# Patient Record
Sex: Female | Born: 1935 | Race: Black or African American | Hispanic: No | Smoking: Never smoker
Health system: Southern US, Community
[De-identification: ages and names within clinical notes are randomized; demographics above are authoritative.]

## PROBLEM LIST (undated history)

## (undated) DIAGNOSIS — G4733 Obstructive sleep apnea (adult) (pediatric): Secondary | ICD-10-CM

## (undated) DIAGNOSIS — I10 Essential (primary) hypertension: Secondary | ICD-10-CM

## (undated) DIAGNOSIS — N289 Disorder of kidney and ureter, unspecified: Secondary | ICD-10-CM

## (undated) DIAGNOSIS — N182 Chronic kidney disease, stage 2 (mild): Secondary | ICD-10-CM

## (undated) DIAGNOSIS — I739 Peripheral vascular disease, unspecified: Secondary | ICD-10-CM

## (undated) DIAGNOSIS — E559 Vitamin D deficiency, unspecified: Secondary | ICD-10-CM

## (undated) DIAGNOSIS — L409 Psoriasis, unspecified: Secondary | ICD-10-CM

## (undated) DIAGNOSIS — K219 Gastro-esophageal reflux disease without esophagitis: Secondary | ICD-10-CM

## (undated) DIAGNOSIS — M1990 Unspecified osteoarthritis, unspecified site: Secondary | ICD-10-CM

## (undated) DIAGNOSIS — G8929 Other chronic pain: Secondary | ICD-10-CM

## (undated) DIAGNOSIS — M25569 Pain in unspecified knee: Secondary | ICD-10-CM

## (undated) DIAGNOSIS — J9610 Chronic respiratory failure, unspecified whether with hypoxia or hypercapnia: Secondary | ICD-10-CM

## (undated) DIAGNOSIS — M5410 Radiculopathy, site unspecified: Secondary | ICD-10-CM

## (undated) DIAGNOSIS — I2510 Atherosclerotic heart disease of native coronary artery without angina pectoris: Secondary | ICD-10-CM

## (undated) DIAGNOSIS — L0390 Cellulitis, unspecified: Secondary | ICD-10-CM

## (undated) DIAGNOSIS — I272 Pulmonary hypertension, unspecified: Secondary | ICD-10-CM

## (undated) DIAGNOSIS — M549 Dorsalgia, unspecified: Secondary | ICD-10-CM

## (undated) DIAGNOSIS — I6529 Occlusion and stenosis of unspecified carotid artery: Secondary | ICD-10-CM

## (undated) DIAGNOSIS — G4730 Sleep apnea, unspecified: Secondary | ICD-10-CM

## (undated) DIAGNOSIS — N200 Calculus of kidney: Secondary | ICD-10-CM

## (undated) DIAGNOSIS — I340 Nonrheumatic mitral (valve) insufficiency: Secondary | ICD-10-CM

## (undated) DIAGNOSIS — E785 Hyperlipidemia, unspecified: Secondary | ICD-10-CM

## (undated) DIAGNOSIS — I509 Heart failure, unspecified: Secondary | ICD-10-CM

## (undated) HISTORY — DX: Occlusion and stenosis of unspecified carotid artery: I65.29

## (undated) HISTORY — PX: ABDOMINAL HYSTERECTOMY: SHX81

## (undated) HISTORY — PX: EYE SURGERY: SHX253

---

## 2007-02-06 ENCOUNTER — Ambulatory Visit: Payer: Self-pay | Admitting: Cardiology

## 2009-08-29 HISTORY — PX: ACROMIO-CLAVICULAR JOINT REPAIR: SHX5183

## 2010-07-09 ENCOUNTER — Ambulatory Visit: Payer: Self-pay | Admitting: Cardiology

## 2010-07-11 ENCOUNTER — Ambulatory Visit: Payer: Self-pay | Admitting: Cardiology

## 2011-10-06 DIAGNOSIS — I5032 Chronic diastolic (congestive) heart failure: Secondary | ICD-10-CM

## 2011-10-06 DIAGNOSIS — I509 Heart failure, unspecified: Secondary | ICD-10-CM

## 2011-10-07 DIAGNOSIS — I5031 Acute diastolic (congestive) heart failure: Secondary | ICD-10-CM

## 2011-10-07 DIAGNOSIS — I200 Unstable angina: Secondary | ICD-10-CM

## 2011-10-10 ENCOUNTER — Inpatient Hospital Stay (HOSPITAL_COMMUNITY)
Admission: AD | Admit: 2011-10-10 | Payer: Self-pay | Source: Other Acute Inpatient Hospital | Admitting: Cardiovascular Disease

## 2011-10-10 ENCOUNTER — Inpatient Hospital Stay (HOSPITAL_COMMUNITY)
Admission: AD | Admit: 2011-10-10 | Discharge: 2011-10-20 | DRG: 246 | Disposition: A | Discharge status: Skilled Nursing Facility | Payer: PRIVATE HEALTH INSURANCE | Source: Ambulatory Visit | Attending: Cardiovascular Disease | Admitting: Cardiovascular Disease

## 2011-10-10 ENCOUNTER — Encounter (HOSPITAL_COMMUNITY): Payer: Self-pay | Admitting: *Deleted

## 2011-10-10 DIAGNOSIS — E119 Type 2 diabetes mellitus without complications: Secondary | ICD-10-CM | POA: Diagnosis present

## 2011-10-10 DIAGNOSIS — G4733 Obstructive sleep apnea (adult) (pediatric): Secondary | ICD-10-CM | POA: Diagnosis present

## 2011-10-10 DIAGNOSIS — E785 Hyperlipidemia, unspecified: Secondary | ICD-10-CM | POA: Diagnosis present

## 2011-10-10 DIAGNOSIS — I214 Non-ST elevation (NSTEMI) myocardial infarction: Secondary | ICD-10-CM

## 2011-10-10 DIAGNOSIS — I2789 Other specified pulmonary heart diseases: Secondary | ICD-10-CM | POA: Diagnosis present

## 2011-10-10 DIAGNOSIS — L408 Other psoriasis: Secondary | ICD-10-CM | POA: Diagnosis present

## 2011-10-10 DIAGNOSIS — Z7982 Long term (current) use of aspirin: Secondary | ICD-10-CM

## 2011-10-10 DIAGNOSIS — Z794 Long term (current) use of insulin: Secondary | ICD-10-CM

## 2011-10-10 DIAGNOSIS — I509 Heart failure, unspecified: Secondary | ICD-10-CM | POA: Diagnosis present

## 2011-10-10 DIAGNOSIS — I2510 Atherosclerotic heart disease of native coronary artery without angina pectoris: Secondary | ICD-10-CM | POA: Diagnosis present

## 2011-10-10 DIAGNOSIS — E1169 Type 2 diabetes mellitus with other specified complication: Secondary | ICD-10-CM | POA: Diagnosis present

## 2011-10-10 DIAGNOSIS — N200 Calculus of kidney: Secondary | ICD-10-CM | POA: Diagnosis present

## 2011-10-10 DIAGNOSIS — Z90710 Acquired absence of both cervix and uterus: Secondary | ICD-10-CM

## 2011-10-10 DIAGNOSIS — I701 Atherosclerosis of renal artery: Secondary | ICD-10-CM | POA: Diagnosis present

## 2011-10-10 DIAGNOSIS — J189 Pneumonia, unspecified organism: Secondary | ICD-10-CM | POA: Diagnosis present

## 2011-10-10 DIAGNOSIS — N179 Acute kidney failure, unspecified: Secondary | ICD-10-CM | POA: Diagnosis present

## 2011-10-10 DIAGNOSIS — I150 Renovascular hypertension: Secondary | ICD-10-CM | POA: Diagnosis present

## 2011-10-10 DIAGNOSIS — J40 Bronchitis, not specified as acute or chronic: Secondary | ICD-10-CM | POA: Diagnosis present

## 2011-10-10 DIAGNOSIS — I739 Peripheral vascular disease, unspecified: Secondary | ICD-10-CM | POA: Diagnosis present

## 2011-10-10 DIAGNOSIS — K219 Gastro-esophageal reflux disease without esophagitis: Secondary | ICD-10-CM | POA: Diagnosis present

## 2011-10-10 DIAGNOSIS — I5033 Acute on chronic diastolic (congestive) heart failure: Secondary | ICD-10-CM

## 2011-10-10 HISTORY — DX: Unspecified osteoarthritis, unspecified site: M19.90

## 2011-10-10 HISTORY — DX: Sleep apnea, unspecified: G47.30

## 2011-10-10 HISTORY — DX: Peripheral vascular disease, unspecified: I73.9

## 2011-10-10 HISTORY — DX: Hyperlipidemia, unspecified: E78.5

## 2011-10-10 HISTORY — DX: Cellulitis, unspecified: L03.90

## 2011-10-10 HISTORY — DX: Atherosclerotic heart disease of native coronary artery without angina pectoris: I25.10

## 2011-10-10 HISTORY — DX: Morbid (severe) obesity due to excess calories: E66.01

## 2011-10-10 HISTORY — DX: Disorder of kidney and ureter, unspecified: N28.9

## 2011-10-10 HISTORY — DX: Nonrheumatic mitral (valve) insufficiency: I34.0

## 2011-10-10 HISTORY — DX: Essential (primary) hypertension: I10

## 2011-10-10 HISTORY — DX: Gastro-esophageal reflux disease without esophagitis: K21.9

## 2011-10-10 HISTORY — DX: Heart failure, unspecified: I50.9

## 2011-10-10 HISTORY — DX: Calculus of kidney: N20.0

## 2011-10-10 HISTORY — DX: Psoriasis, unspecified: L40.9

## 2011-10-10 LAB — CREATININE, SERUM: Creatinine, Ser: 1.09 mg/dL (ref 0.50–1.10)

## 2011-10-10 LAB — CBC
MCH: 26.3 pg (ref 26.0–34.0)
MCHC: 31.4 g/dL (ref 30.0–36.0)
MCV: 83.8 fL (ref 78.0–100.0)
Platelets: 205 10*3/uL (ref 150–400)

## 2011-10-10 MED ORDER — CLONIDINE HCL 0.1 MG/24HR TD PTWK
0.1000 mg | MEDICATED_PATCH | TRANSDERMAL | Status: DC
  Administered 2011-10-10: 0.1 mg via TRANSDERMAL
  Filled 2011-10-10 (×2): qty 1

## 2011-10-10 MED ORDER — INSULIN GLARGINE 100 UNIT/ML ~~LOC~~ SOLN
74.0000 [IU] | Freq: Two times a day (BID) | SUBCUTANEOUS | Status: DC
  Administered 2011-10-11 – 2011-10-19 (×14): 74 [IU] via SUBCUTANEOUS
  Filled 2011-10-10 (×3): qty 3

## 2011-10-10 MED ORDER — LEVOFLOXACIN 500 MG PO TABS
500.0000 mg | ORAL_TABLET | Freq: Every day | ORAL | Status: DC
  Administered 2011-10-10 – 2011-10-17 (×8): 500 mg via ORAL
  Filled 2011-10-10 (×9): qty 1

## 2011-10-10 MED ORDER — SODIUM CHLORIDE 0.9 % IV SOLN: 250.0000 mL | INTRAVENOUS | Status: DC | PRN

## 2011-10-10 MED ORDER — SIMVASTATIN 20 MG PO TABS
20.0000 mg | ORAL_TABLET | Freq: Every day | ORAL | Status: DC
  Administered 2011-10-10 – 2011-10-19 (×10): 20 mg via ORAL
  Filled 2011-10-10 (×12): qty 1

## 2011-10-10 MED ORDER — ACETAMINOPHEN 325 MG PO TABS
650.0000 mg | ORAL_TABLET | ORAL | Status: DC | PRN
  Administered 2011-10-18 – 2011-10-19 (×2): 650 mg via ORAL
  Filled 2011-10-10 (×2): qty 2

## 2011-10-10 MED ORDER — CALCIPOTRIENE-BETAMETH DIPROP 0.005-0.064 % EX OINT: 1.0000 "application " | TOPICAL_OINTMENT | Freq: Every day | CUTANEOUS | Status: DC

## 2011-10-10 MED ORDER — ATENOLOL 100 MG PO TABS
100.0000 mg | ORAL_TABLET | Freq: Every day | ORAL | Status: DC
  Administered 2011-10-11 – 2011-10-20 (×10): 100 mg via ORAL
  Filled 2011-10-10 (×11): qty 1

## 2011-10-10 MED ORDER — ASPIRIN EC 81 MG PO TBEC
81.0000 mg | DELAYED_RELEASE_TABLET | Freq: Every day | ORAL | Status: DC
  Filled 2011-10-10 (×2): qty 1

## 2011-10-10 MED ORDER — DIAZEPAM 5 MG PO TABS
5.0000 mg | ORAL_TABLET | ORAL | Status: DC
  Filled 2011-10-10: qty 1

## 2011-10-10 MED ORDER — SODIUM CHLORIDE 0.9 % IV SOLN
INTRAVENOUS | Status: DC
  Administered 2011-10-11: 75 mL/h via INTRAVENOUS

## 2011-10-10 MED ORDER — SODIUM CHLORIDE 0.9 % IJ SOLN: 3.0000 mL | INTRAMUSCULAR | Status: DC | PRN

## 2011-10-10 MED ORDER — SODIUM CHLORIDE 0.9 % IJ SOLN
3.0000 mL | Freq: Two times a day (BID) | INTRAMUSCULAR | Status: DC
  Administered 2011-10-11 – 2011-10-15 (×7): 3 mL via INTRAVENOUS

## 2011-10-10 MED ORDER — NON FORMULARY: 40.0000 mg | Freq: Every day | Status: DC

## 2011-10-10 MED ORDER — CALCIPOTRIENE-BETAMETH DIPROP 0.005-0.064 % EX OINT
TOPICAL_OINTMENT | Freq: Every day | CUTANEOUS | Status: DC
  Administered 2011-10-12 – 2011-10-20 (×7): via TOPICAL

## 2011-10-10 MED ORDER — INSULIN GLARGINE 100 UNIT/ML ~~LOC~~ SOLN
74.0000 [IU] | Freq: Two times a day (BID) | SUBCUTANEOUS | Status: DC
  Filled 2011-10-10: qty 3

## 2011-10-10 MED ORDER — MONTELUKAST SODIUM 10 MG PO TABS
10.0000 mg | ORAL_TABLET | Freq: Every day | ORAL | Status: DC
  Administered 2011-10-11 – 2011-10-20 (×10): 10 mg via ORAL
  Filled 2011-10-10 (×10): qty 1

## 2011-10-10 MED ORDER — ESOMEPRAZOLE MAGNESIUM 40 MG PO CPDR
40.0000 mg | DELAYED_RELEASE_CAPSULE | Freq: Every day | ORAL | Status: DC
  Administered 2011-10-10 – 2011-10-20 (×11): 40 mg via ORAL
  Filled 2011-10-10 (×13): qty 1

## 2011-10-10 MED ORDER — FUROSEMIDE 40 MG PO TABS
40.0000 mg | ORAL_TABLET | Freq: Every day | ORAL | Status: DC
  Administered 2011-10-11: 40 mg via ORAL
  Filled 2011-10-10: qty 1

## 2011-10-10 MED ORDER — INSULIN ASPART 100 UNIT/ML ~~LOC~~ SOLN
20.0000 [IU] | Freq: Three times a day (TID) | SUBCUTANEOUS | Status: DC
  Administered 2011-10-11 – 2011-10-18 (×12): 20 [IU] via SUBCUTANEOUS
  Filled 2011-10-10: qty 3

## 2011-10-10 MED ORDER — ENOXAPARIN SODIUM 40 MG/0.4ML ~~LOC~~ SOLN
40.0000 mg | SUBCUTANEOUS | Status: DC
  Administered 2011-10-10 – 2011-10-13 (×4): 40 mg via SUBCUTANEOUS
  Filled 2011-10-10 (×5): qty 0.4

## 2011-10-10 MED ORDER — OLMESARTAN MEDOXOMIL 40 MG PO TABS
40.0000 mg | ORAL_TABLET | Freq: Every day | ORAL | Status: DC
  Administered 2011-10-11 – 2011-10-13 (×3): 40 mg via ORAL
  Filled 2011-10-10 (×4): qty 1

## 2011-10-10 MED ORDER — INSULIN GLARGINE 100 UNIT/ML ~~LOC~~ SOLN
37.0000 [IU] | Freq: Two times a day (BID) | SUBCUTANEOUS | Status: DC
  Administered 2011-10-10: 37 [IU] via SUBCUTANEOUS

## 2011-10-10 MED ORDER — POTASSIUM CHLORIDE CRYS ER 20 MEQ PO TBCR
20.0000 meq | EXTENDED_RELEASE_TABLET | Freq: Every day | ORAL | Status: DC
  Administered 2011-10-11 – 2011-10-20 (×10): 20 meq via ORAL
  Filled 2011-10-10 (×10): qty 1

## 2011-10-10 MED ORDER — TACROLIMUS 0.1 % EX OINT
TOPICAL_OINTMENT | Freq: Two times a day (BID) | CUTANEOUS | Status: DC
  Administered 2011-10-13 – 2011-10-20 (×11): via TOPICAL

## 2011-10-10 MED ORDER — GABAPENTIN 300 MG PO CAPS
600.0000 mg | ORAL_CAPSULE | Freq: Two times a day (BID) | ORAL | Status: DC
  Administered 2011-10-10 – 2011-10-19 (×19): 600 mg via ORAL
  Filled 2011-10-10 (×24): qty 2

## 2011-10-10 MED ORDER — FOLIC ACID 1 MG PO TABS
1.0000 mg | ORAL_TABLET | Freq: Every day | ORAL | Status: DC
  Administered 2011-10-11 – 2011-10-20 (×10): 1 mg via ORAL
  Filled 2011-10-10 (×10): qty 1

## 2011-10-10 MED ORDER — ONDANSETRON HCL 4 MG/2ML IJ SOLN: 4.0000 mg | Freq: Four times a day (QID) | INTRAMUSCULAR | Status: DC | PRN

## 2011-10-10 MED ORDER — ALPRAZOLAM 0.25 MG PO TABS
0.2500 mg | ORAL_TABLET | Freq: Two times a day (BID) | ORAL | Status: DC | PRN
  Administered 2011-10-18 – 2011-10-19 (×2): 0.25 mg via ORAL
  Filled 2011-10-10 (×2): qty 1

## 2011-10-10 MED ORDER — DIAZEPAM 5 MG PO TABS
5.0000 mg | ORAL_TABLET | Freq: Once | ORAL | Status: AC
  Administered 2011-10-11: 5 mg via ORAL
  Filled 2011-10-10: qty 1

## 2011-10-10 MED ORDER — TRIAMTERENE-HCTZ 37.5-25 MG PO TABS
1.0000 | ORAL_TABLET | Freq: Every day | ORAL | Status: DC
  Administered 2011-10-11 – 2011-10-19 (×9): 1 via ORAL
  Filled 2011-10-10 (×9): qty 1

## 2011-10-10 MED ORDER — METHOTREXATE 2.5 MG PO TABS
2.5000 mg | ORAL_TABLET | ORAL | Status: DC
  Administered 2011-10-12 – 2011-10-19 (×2): 2.5 mg via ORAL
  Filled 2011-10-10 (×2): qty 1

## 2011-10-10 MED ORDER — SODIUM CHLORIDE 0.9 % IJ SOLN
3.0000 mL | Freq: Two times a day (BID) | INTRAMUSCULAR | Status: DC
  Administered 2011-10-10 – 2011-10-17 (×10): 3 mL via INTRAVENOUS

## 2011-10-10 MED ORDER — NITROGLYCERIN 0.4 MG SL SUBL: 0.4000 mg | SUBLINGUAL_TABLET | SUBLINGUAL | Status: DC | PRN

## 2011-10-10 MED ORDER — TACROLIMUS 0.1 % EX OINT: 1.0000 "application " | TOPICAL_OINTMENT | Freq: Two times a day (BID) | CUTANEOUS | Status: DC

## 2011-10-10 MED ORDER — ZOLPIDEM TARTRATE 5 MG PO TABS: 5.0000 mg | ORAL_TABLET | Freq: Every evening | ORAL | Status: DC | PRN

## 2011-10-10 MED ORDER — ASPIRIN 81 MG PO CHEW
324.0000 mg | CHEWABLE_TABLET | ORAL | Status: AC
  Administered 2011-10-11: 324 mg via ORAL
  Filled 2011-10-10: qty 4

## 2011-10-11 ENCOUNTER — Encounter (HOSPITAL_COMMUNITY)
Admission: AD | Disposition: A | Discharge status: Skilled Nursing Facility | Payer: Self-pay | Source: Ambulatory Visit | Attending: Cardiovascular Disease

## 2011-10-11 ENCOUNTER — Encounter (HOSPITAL_COMMUNITY): Admission: RE | Payer: Self-pay | Source: Ambulatory Visit

## 2011-10-11 ENCOUNTER — Ambulatory Visit (HOSPITAL_COMMUNITY)
Admission: RE | Admit: 2011-10-11 | Payer: PRIVATE HEALTH INSURANCE | Source: Ambulatory Visit | Admitting: Cardiovascular Disease

## 2011-10-11 DIAGNOSIS — I739 Peripheral vascular disease, unspecified: Secondary | ICD-10-CM

## 2011-10-11 DIAGNOSIS — I2510 Atherosclerotic heart disease of native coronary artery without angina pectoris: Secondary | ICD-10-CM

## 2011-10-11 DIAGNOSIS — I701 Atherosclerosis of renal artery: Secondary | ICD-10-CM

## 2011-10-11 HISTORY — PX: LEFT AND RIGHT HEART CATHETERIZATION WITH CORONARY ANGIOGRAM: SHX5449

## 2011-10-11 HISTORY — PX: LOWER EXTREMITY ANGIOGRAM: SHX5508

## 2011-10-11 LAB — CREATININE, SERUM: Creatinine, Ser: 0.98 mg/dL (ref 0.50–1.10)

## 2011-10-11 LAB — BASIC METABOLIC PANEL WITH GFR
BUN: 28 mg/dL — ABNORMAL HIGH (ref 6–23)
CO2: 28 meq/L (ref 19–32)
Calcium: 10 mg/dL (ref 8.4–10.5)
Chloride: 108 meq/L (ref 96–112)
Creatinine, Ser: 0.94 mg/dL (ref 0.50–1.10)
GFR calc Af Amer: 65 mL/min — ABNORMAL LOW
GFR calc non Af Amer: 56 mL/min — ABNORMAL LOW
Glucose, Bld: 88 mg/dL (ref 70–99)
Potassium: 4.5 meq/L (ref 3.5–5.1)
Sodium: 144 meq/L (ref 135–145)

## 2011-10-11 LAB — POCT I-STAT 3, ART BLOOD GAS (G3+)
Acid-Base Excess: 2 mmol/L (ref 0.0–2.0)
Bicarbonate: 28.8 mEq/L — ABNORMAL HIGH (ref 20.0–24.0)
TCO2: 31 mmol/L (ref 0–100)
pH, Arterial: 7.327 — ABNORMAL LOW (ref 7.350–7.400)

## 2011-10-11 LAB — POCT I-STAT 3, VENOUS BLOOD GAS (G3P V)
Bicarbonate: 27.9 mEq/L — ABNORMAL HIGH (ref 20.0–24.0)
O2 Saturation: 58 %
TCO2: 30 mmol/L (ref 0–100)
pCO2, Ven: 60.4 mmHg — ABNORMAL HIGH (ref 45.0–50.0)

## 2011-10-11 LAB — CBC
Hemoglobin: 11.6 g/dL — ABNORMAL LOW (ref 12.0–15.0)
Hemoglobin: 11.2 g/dL — ABNORMAL LOW (ref 12.0–15.0)
MCH: 26.3 pg (ref 26.0–34.0)
MCH: 26.2 pg (ref 26.0–34.0)
MCHC: 30.7 g/dL (ref 30.0–36.0)
MCHC: 30 g/dL (ref 30.0–36.0)
MCV: 85.7 fL (ref 78.0–100.0)
MCV: 87.4 fL (ref 78.0–100.0)
Platelets: 207 10*3/uL (ref 150–400)
RBC: 4.41 MIL/uL (ref 3.87–5.11)

## 2011-10-11 LAB — GLUCOSE, CAPILLARY
Glucose-Capillary: 65 mg/dL — ABNORMAL LOW (ref 70–99)
Glucose-Capillary: 58 mg/dL — ABNORMAL LOW (ref 70–99)
Glucose-Capillary: 180 mg/dL — ABNORMAL HIGH (ref 70–99)

## 2011-10-11 SURGERY — ANGIOGRAM, LOWER EXTREMITY
Anesthesia: LOCAL

## 2011-10-11 MED ORDER — FENTANYL CITRATE 0.05 MG/ML IJ SOLN
INTRAMUSCULAR | Status: AC
  Filled 2011-10-11: qty 2

## 2011-10-11 MED ORDER — ONDANSETRON HCL 4 MG/2ML IJ SOLN: 4.0000 mg | Freq: Four times a day (QID) | INTRAMUSCULAR | Status: DC | PRN

## 2011-10-11 MED ORDER — ENOXAPARIN SODIUM 40 MG/0.4ML ~~LOC~~ SOLN
40.0000 mg | SUBCUTANEOUS | Status: DC
  Administered 2011-10-12 – 2011-10-16 (×5): 40 mg via SUBCUTANEOUS
  Filled 2011-10-11 (×8): qty 0.4

## 2011-10-11 MED ORDER — TICAGRELOR 90 MG PO TABS
90.0000 mg | ORAL_TABLET | Freq: Two times a day (BID) | ORAL | Status: DC
  Administered 2011-10-11 – 2011-10-20 (×18): 90 mg via ORAL
  Filled 2011-10-11 (×20): qty 1

## 2011-10-11 MED ORDER — SODIUM CHLORIDE 0.9 % IV SOLN: INTRAVENOUS | Status: AC

## 2011-10-11 MED ORDER — LIDOCAINE HCL (PF) 1 % IJ SOLN
INTRAMUSCULAR | Status: AC
  Filled 2011-10-11: qty 30

## 2011-10-11 MED ORDER — HEPARIN (PORCINE) IN NACL 2-0.9 UNIT/ML-% IJ SOLN
INTRAMUSCULAR | Status: AC
  Filled 2011-10-11: qty 2000

## 2011-10-11 MED ORDER — MIDAZOLAM HCL 2 MG/2ML IJ SOLN
INTRAMUSCULAR | Status: AC
  Filled 2011-10-11: qty 2

## 2011-10-11 MED ORDER — ASPIRIN 81 MG PO CHEW
81.0000 mg | CHEWABLE_TABLET | Freq: Every day | ORAL | Status: DC
  Administered 2011-10-12 – 2011-10-20 (×9): 81 mg via ORAL
  Filled 2011-10-11 (×8): qty 1

## 2011-10-11 MED ORDER — ACETAMINOPHEN 325 MG PO TABS: 650.0000 mg | ORAL_TABLET | ORAL | Status: DC | PRN

## 2011-10-11 MED ORDER — HYDRALAZINE HCL 20 MG/ML IJ SOLN
INTRAMUSCULAR | Status: AC
  Filled 2011-10-11: qty 1

## 2011-10-11 MED ORDER — FUROSEMIDE 10 MG/ML IJ SOLN
40.0000 mg | Freq: Two times a day (BID) | INTRAMUSCULAR | Status: DC
  Administered 2011-10-11 – 2011-10-13 (×4): 40 mg via INTRAVENOUS
  Filled 2011-10-11 (×7): qty 4

## 2011-10-11 NOTE — Procedures (Signed)
Cardiac Catheterization Procedure Note  Name: Theresa Mclean MRN: [id number] DOB: 02-Aug-1929  Procedure: Right Heart Cath, Left Heart Cath, Selective Coronary Angiography, LV angiography  Indication:    Procedural Details: The right groin was prepped, draped, and anesthetized with 1% lidocaine. Using the modified Seldinger technique a 5 French sheath was placed in the right femoral artery and a 7 French sheath was placed in the right femoral vein. A Swan-Ganz catheter was used for the right heart catheterization. Standard protocol was followed for recording of right heart pressures and sampling of oxygen saturations. Fick cardiac output was calculated. Standard Judkins catheters were used for selective coronary angiography and left ventriculography. The pigtail catheter was brought down into the suprarenal abdominal aorta in a digitally subtracted abdominal aortogram was performed. Crossover catheter was then used to access the left femoral artery and this was changed out for a straight endhole catheter. An external iliac angiogram of the left leg was performed with runoff to the left foot. The catheter was removed over a wire. There were no immediate procedural complications. The patient was transferred to the post catheterization recovery area for further monitoring.    Procedural Findings: Hemodynamics RA 16 RV 74/70 PA 70/22 mean of 38 PCWP 21 LV 157/20 AO 145/53 mean of 83  Oxygen saturations: PA 58% AO 94%  Cardiac Output (Fick) 4.8 L per minute  Cardiac Index (Fick) 2.3 L per minute per meter squared   Coronary angiography: Coronary dominance: right  Left mainstem: The left main stem is patent. There is no significant obstructive disease present. There are mild luminal irregularities present.  Left anterior descending (LAD): The LAD reaches the left ventricular apex. There is mild to moderate proximal and mid LAD stenoses in the range of 30-50%. There is no high-grade stenosis  throughout the course of the LAD.  Left circumflex (LCx): The left circumflex is patent. The first OM branch is patent. There is severe stenosis of the mid circumflex beyond the OM branch. The vessel fills slowly beyond the area of critical stenosis. There is serial 90-95% lesions present.  Right coronary artery (RCA): The RCA is dominant. There is a 40-50% proximal stenosis. The mid vessel has severe 90-95% long segment stenosis. The distal vessel is patent and the PDA and posterolateral branches are patent.  Left ventriculography: Left ventricular systolic function is normal, LVEF is estimated at 55-65%, there is no significant mitral regurgitation   Abdominal aortogram: The abdominal aorta is patent without aneurysm. The right renal artery has severe ostial stenosis. The left renal artery is patent. The iliac arteries are patent bilaterally  Left external iliac angiogram with runoff: The common femoral, deep femoral, superficial femoral arteries are patent. There is severe diffuse stenosis in the midportion of the left superficial femoral artery. There is brisk two-vessel runoff to the left foot.  Final Conclusions:   1. Severe two-vessel coronary artery disease with severe stenosis of the right coronary artery and left circumflex 2. Moderate to Severe pulmonary hypertension 3. Normal LV systolic function 4. Severe right renal artery stenosis 5. Severe left superficial femoral artery stenosis  Recommendations: The patient has severe two-vessel coronary disease. She has mild to moderate LAD stenosis. She is not a good candidate for consideration of coronary bypass due to her advanced age, limited mobility, and other comorbidities. I think she would benefit from two-vessel PCI. I will like to treat her heart failure a little more aggressively and try to optimize her hemodynamics before performing intervention. In addition, in  this patient with diabetes I think she should be preloaded with  antiplatelet therapy and this will be done. We'll plan on two-vessel PCI later this week.   Tonny Bollman 10/11/2011, 1:53 PM

## 2011-10-11 NOTE — H&P (Signed)
The patient is an 78 year old woman transferred from [facility] for right and left cardiac catheterization with lower extremity angiography. The patient presented with congestive heart failure. She had mildly elevated troponin. She has multiple cardiac risk factors. She also has abnormal  ABIs on the left with an ankle-brachial index of 70%.  The patient has left leg weakness with ambulation. Plan is for cardiac catheterization enlarged from angiography with focus on the left leg. I have reviewed the risks, indication, and alternatives with the patient in detail. She understands and agrees to proceed.  There is a paper H&P in the chart from Dr. Andee Lineman. This was reviewed in detail with no additions to make.

## 2011-10-11 NOTE — Progress Notes (Signed)
10/11/11 1500 UR Completed. Tera Mater, RN, BSN

## 2011-10-12 DIAGNOSIS — I214 Non-ST elevation (NSTEMI) myocardial infarction: Principal | ICD-10-CM | POA: Diagnosis present

## 2011-10-12 DIAGNOSIS — I5033 Acute on chronic diastolic (congestive) heart failure: Secondary | ICD-10-CM

## 2011-10-12 DIAGNOSIS — E119 Type 2 diabetes mellitus without complications: Secondary | ICD-10-CM | POA: Diagnosis present

## 2011-10-12 MED ORDER — MAGNESIUM HYDROXIDE 400 MG/5ML PO SUSP
5.0000 mL | Freq: Every day | ORAL | Status: DC | PRN
  Filled 2011-10-12: qty 30

## 2011-10-12 NOTE — Progress Notes (Signed)
Inpatient Diabetes Program Recommendations  AACE/ADA: New Consensus Statement on Inpatient Glycemic Control (2009)  Target Ranges:  Prepandial:   less than 140 mg/dL      Peak postprandial:   less than 180 mg/dL (1-2 hours)      Critically ill patients:  140 - 180 mg/dL   Reason for Visit: History of Diabetes and on insulin  Inpatient Diabetes Program Recommendations Correction (SSI): Check CBGs while on insulin

## 2011-10-12 NOTE — Progress Notes (Signed)
Bloody appearance to urine in foley bag.  MD notified. Will continue to monitor.

## 2011-10-12 NOTE — Progress Notes (Signed)
    Subjective:  No chest pain or dyspnea at rest. No complaints this am.  Objective:  Vital Signs in the last 24 hours: Temp:  [97.4 F (36.3 C)-99.3 F (37.4 C)] 99.3 F (37.4 C) (02/13 0500) Pulse Rate:  [52-60] 60  (02/13 0500) Resp:  [18-22] 18  (02/13 0500) BP: (134-155)/(42-63) 155/63 mmHg (02/13 0500) SpO2:  [98 %-100 %] 98 % (02/13 0500) Weight:  [101.288 kg (223 lb 4.8 oz)] 101.288 kg (223 lb 4.8 oz) (02/13 0500)  Intake/Output from previous day: 02/12 0701 - 02/13 0700 In: 240 [P.O.:240] Out: 2050 [Urine:2050]  Physical Exam: Pt is alert and oriented, obese woman in NAD HEENT: normal Neck: JVP - normal, carotids 2+= without bruits Lungs: CTA bilaterally CV: RRR without murmur or gallop Abd: soft, NT, Positive BS, no hepatomegaly Ext: no C/C/E, distal pulses intact and equal. Right groin clear. Skin: warm/dry no rash   Lab Results:  Basename 10/11/11 1314 10/11/11 0550  WBC 10.6* 9.5  HGB 11.2* 11.6*  PLT 207 198    Basename 10/11/11 1314 10/11/11 0550  NA -- 144  K -- 4.5  CL -- 108  CO2 -- 28  GLUCOSE -- 88  BUN -- 28*  CREATININE 0.98 0.94   No results found for this basename: TROPONINI:2,CK,MB:2 in the last 72 hours  Tele: sinus rhythm, no arrhythmia  Assessment/Plan:  1. Acute on chronic diastolic CHF 2. NSTEMI with severe 2 vessel CAD 3. PAD - left SFA disease with mild decrease in ABI 4. Type 2 DM 5. Pulmonary HTN 6. Essential HTN  Continue to diurese today as tolerated. Repeat metabolic panel in am. Pt now loaded with Brilinta. Tentatively plan PCI of the RCA and LCx tomorrow or Friday depending on her clinical picture.  Tonny Bollman, M.D. 10/12/2011, 7:34 AM

## 2011-10-13 ENCOUNTER — Inpatient Hospital Stay (HOSPITAL_COMMUNITY): Payer: PRIVATE HEALTH INSURANCE

## 2011-10-13 LAB — CBC
HCT: 36.5 % (ref 36.0–46.0)
HCT: 37.3 % (ref 36.0–46.0)
Hemoglobin: 11.4 g/dL — ABNORMAL LOW (ref 12.0–15.0)
Hemoglobin: 11.8 g/dL — ABNORMAL LOW (ref 12.0–15.0)
MCH: 25.4 pg — ABNORMAL LOW (ref 26.0–34.0)
MCHC: 31.2 g/dL (ref 30.0–36.0)
MCHC: 31.6 g/dL (ref 30.0–36.0)
MCV: 81.5 fL (ref 78.0–100.0)
MCV: 84.2 fL (ref 78.0–100.0)
RDW: 17.4 % — ABNORMAL HIGH (ref 11.5–15.5)

## 2011-10-13 LAB — GLUCOSE, CAPILLARY
Glucose-Capillary: 120 mg/dL — ABNORMAL HIGH (ref 70–99)
Glucose-Capillary: 148 mg/dL — ABNORMAL HIGH (ref 70–99)

## 2011-10-13 LAB — BASIC METABOLIC PANEL
BUN: 38 mg/dL — ABNORMAL HIGH (ref 6–23)
CO2: 28 mEq/L (ref 19–32)
Chloride: 102 mEq/L (ref 96–112)
Creatinine, Ser: 1.69 mg/dL — ABNORMAL HIGH (ref 0.50–1.10)
Potassium: 4.4 mEq/L (ref 3.5–5.1)

## 2011-10-13 MED ORDER — DIAZEPAM 5 MG PO TABS: 5.0000 mg | ORAL_TABLET | ORAL | Status: AC

## 2011-10-13 MED ORDER — SODIUM CHLORIDE 0.9 % IJ SOLN: 3.0000 mL | Freq: Two times a day (BID) | INTRAMUSCULAR | Status: DC

## 2011-10-13 MED ORDER — FUROSEMIDE 40 MG PO TABS
40.0000 mg | ORAL_TABLET | Freq: Every day | ORAL | Status: DC
  Administered 2011-10-13: 40 mg via ORAL
  Filled 2011-10-13 (×2): qty 1

## 2011-10-13 MED ORDER — SODIUM CHLORIDE 0.9 % IJ SOLN: 3.0000 mL | INTRAMUSCULAR | Status: DC | PRN

## 2011-10-13 MED ORDER — SODIUM CHLORIDE 0.9 % IV SOLN
INTRAVENOUS | Status: DC
  Administered 2011-10-14 – 2011-10-16 (×2): via INTRAVENOUS

## 2011-10-13 MED ORDER — SODIUM CHLORIDE 0.9 % IV SOLN: 250.0000 mL | INTRAVENOUS | Status: DC | PRN

## 2011-10-13 NOTE — Progress Notes (Addendum)
Pt complained of black stool today(unwitnessed by tech or rn) and coughing up blood. Ward Givens, NP called new orders received: Chest xray and Guiac stools and cbc in am

## 2011-10-13 NOTE — Progress Notes (Signed)
    Subjective:  No chest pain or dyspnea. Hasn't been out of bed much at all.   Objective:  Vital Signs in the last 24 hours: Temp:  [98 F (36.7 C)-98.3 F (36.8 C)] 98.3 F (36.8 C) (02/14 0500) Pulse Rate:  [52-56] 56  (02/14 0500) Resp:  [20-28] 20  (02/14 0500) BP: (130-137)/(60-74) 137/74 mmHg (02/14 0500) SpO2:  [95 %-98 %] 95 % (02/14 0500) Weight:  [102 kg (224 lb 13.9 oz)] 102 kg (224 lb 13.9 oz) (02/14 0500)  Intake/Output from previous day: 02/13 0701 - 02/14 0700 In: 720 [P.O.:720] Out: 1951 [Urine:1950; Stool:1]  Physical Exam: Pt is alert and oriented, obese woman in NAD HEENT: normal Neck: JVP - normal, carotids 2+= without bruits Lungs: CTA bilaterally CV: RRR without murmur or gallop Abd: soft, NT, Positive BS, no hepatomegaly Ext: no C/C/E, distal pulses intact and equal Skin: warm/dry no rash   Lab Results:  Basename 10/13/11 0554 10/11/11 1314  WBC 9.9 10.6*  HGB 11.4* 11.2*  PLT 197 207    Basename 10/11/11 1314 10/11/11 0550  NA -- 144  K -- 4.5  CL -- 108  CO2 -- 28  GLUCOSE -- 88  BUN -- 28*  CREATININE 0.98 0.94   No results found for this basename: TROPONINI:2,CK,MB:2 in the last 72 hours  Assessment/Plan:  1. Acute on chronic diastolic CHF  2. NSTEMI with severe 2 vessel CAD  3. PAD - left SFA disease with mild decrease in ABI  4. Type 2 DM  5. Pulmonary HTN  6. Essential HTN  Plan for 2 vessel PCI tomorrow. Awaiting BMET from this morning. Pt has diuresed well over the past 2 days and will change to oral furosemide today. Risks, indication of PCI reviewed with patient in detail. Will ask PT to eval and treat patient today. She does have significant CAD but at least can get up to get out of bed with their assistance and have strength/mobility assessed. Will place parameters for insulin mealtime coverage.  Tonny Bollman, M.D. 10/13/2011, 8:39 AM

## 2011-10-14 ENCOUNTER — Encounter (HOSPITAL_COMMUNITY)
Admission: AD | Disposition: A | Discharge status: Skilled Nursing Facility | Payer: Self-pay | Source: Ambulatory Visit | Attending: Cardiovascular Disease

## 2011-10-14 ENCOUNTER — Encounter (HOSPITAL_COMMUNITY): Payer: Self-pay

## 2011-10-14 ENCOUNTER — Ambulatory Visit (HOSPITAL_COMMUNITY): Admit: 2011-10-14 | Payer: PRIVATE HEALTH INSURANCE | Admitting: Cardiovascular Disease

## 2011-10-14 LAB — BASIC METABOLIC PANEL WITH GFR
BUN: 41 mg/dL — ABNORMAL HIGH (ref 6–23)
CO2: 29 meq/L (ref 19–32)
Calcium: 10.4 mg/dL (ref 8.4–10.5)
Chloride: 98 meq/L (ref 96–112)
Creatinine, Ser: 1.88 mg/dL — ABNORMAL HIGH (ref 0.50–1.10)
GFR calc Af Amer: 28 mL/min — ABNORMAL LOW
GFR calc non Af Amer: 24 mL/min — ABNORMAL LOW
Glucose, Bld: 94 mg/dL (ref 70–99)
Potassium: 4.6 meq/L (ref 3.5–5.1)
Sodium: 138 meq/L (ref 135–145)

## 2011-10-14 LAB — CBC
Hemoglobin: 11.8 g/dL — ABNORMAL LOW (ref 12.0–15.0)
MCHC: 31.4 g/dL (ref 30.0–36.0)
RDW: 16.2 % — ABNORMAL HIGH (ref 11.5–15.5)
WBC: 9.9 10*3/uL (ref 4.0–10.5)

## 2011-10-14 LAB — GLUCOSE, CAPILLARY: Glucose-Capillary: 90 mg/dL (ref 70–99)

## 2011-10-14 SURGERY — PERCUTANEOUS CORONARY STENT INTERVENTION (PCI-S)
Anesthesia: LOCAL

## 2011-10-14 NOTE — Progress Notes (Signed)
   CARE MANAGEMENT NOTE HEART FAILURE  10/14/2011   Patient:  Theresa Mclean, Theresa Mclean   Account Number:  [id number]    Date Initiated:  10/11/2011  Documentation initiated by:  Tera Mater  Subjective/Objective Assessment:   78yo female admitted from [facility] with CHF. Pt. lives with her children.   Action/Plan:   Discharge planning for possible [facility] RN for HF management   Anticipated DC Date:  10/14/2011  Anticipated DC Plan:  HOME W HOME HEALTH SERVICES  DC Planning Services:  CM consult    Choice offered to / List presented to:          Status of service:  In process, will continue to follow  Medicare Important Message Given:   (If response is "NO", the following Medicare IM given date fields will be blank) Date Medicare IM Given:   Date Additional Medicare IM Given:    Discharge Disposition:    Per UR Regulation:  Reviewed for med. necessity/level of care/duration of stay  Comments:   10/14/11 1500 Met with pt. to discuss discharge plans.  Pt. states she has an aide that comes in once a day to help her with ADLs.  Pt. is interested in having HH RN, HH PT HH aide as well.  Pt. son, Aracelly Tencza, lives with her.  Will contact son and give choice; as pt. lives in [place], [place]. Tera Mater, RN, BSN [phone]   10/11/11 1500 UR Completed. Tera Mater, RN, BSN   Initial CM contact:  10/14/2011 03:00 PM  By:  Tera Mater Initial CSW contact:     By:      Is this an INP Readmission < 30 days:  N (If "YES" please see readmission information at the bottom of note)  Patient living status prior to this admission:  FAMILY  Patient setting prior to this admission:  HOME  Comorbid conditions being treated that contributed to this admission:  CHF, CAD, DM  CHF Readmission Risk:  high  Type of patient education provided  Weigh daily  HF Patient Education Assessment / Teach Back  HF Zone Tool / Magnet  Limit salt intake     Patient education provided by    [facility]    Was referral made to [facility]:  N  Is the patient's PCP the same as attending:  N PCP:    Readmission < 30 Days If pt has HH, did they contact the agency before going to the ED:   Name of [facility] agency:    Was the follow-up physician visit scheduled prior to discharge:    Did the patient follow-up with the physician prior to this readmission:    Was there HF Clinic visits prior to readmission:    Were there ED visits between admissions:    Readmit type:    If unscheduled and related indicate reason for readmit:

## 2011-10-14 NOTE — Evaluation (Addendum)
Physical Therapy Evaluation Patient Details Name: Theresa Mclean MRN: [id number] DOB: 02-Feb-1929 Today's Date: 10/14/2011  Problem List:  Patient Active Problem List  Diagnoses  . Acute on chronic diastolic heart failure  . Acute myocardial infarction, subendocardial infarction, initial episode of care  . Diabetes mellitus    Past Medical History:  Past Medical History  Diagnosis Date  . Hypertension   . Shortness of breath   . Nephrolithiasis   . GERD (gastroesophageal reflux disease)   . Arthritis   . Angina   . Psoriasis   . Obesity   . Mitral regurgitation   . Hyperlipidemia   . Sleep apnea     uses cpap  . CHF (congestive heart failure)   . Diabetes mellitus     insulin dependent   Past Surgical History:  Past Surgical History  Procedure Date  . Acromio-clavicular joint repair 2011  . Abdominal hysterectomy   . Eye surgery     cataract  OD    PT Assessment/Plan/Recommendation PT Assessment Clinical Impression Statement: Pt with CHF, NSTEMI, scheduled for stent placement this afternoon, but will be delayed to next week due to decreased kidney function at present. Pt presents with decreased activity tolerance and generalized weakness and will benefit from skilled PT to address these impairments for safe DC with decreaed risk of falls and caregiver burden, hopefully home. DC disposition guarded as pt will need to be at S level to go home, otherwise will likely need SNF. At time of eval, pt limited by fatigue, but mnotivated to get moving and home. Also, pt's sister passed away this morning, per her daughter, but she was unaware at time of eval. PT Recommendation/Assessment: Patient will need skilled PT in the acute care venue PT Problem List: Decreased strength;Decreased activity tolerance;Decreased balance;Decreased mobility;Decreased knowledge of use of DME;Decreased safety awareness;Decreased knowledge of precautions;Cardiopulmonary status limiting  activity;Obesity Barriers to Discharge: Decreased caregiver support (Pt's son lives with her, but had recent nerve compression issues with residual LE weakness.)  PT Therapy Diagnosis : Generalized weakness PT Plan PT Frequency: Min 3X/week PT Treatment/Interventions: DME instruction;Gait training;Functional mobility training;Therapeutic activities;Therapeutic exercise;Balance training;Neuromuscular re-education;Patient/family education PT Recommendation Follow Up Recommendations: Home health PT;Supervision/Assistance - 24 hour (May need SNF unless able to reach S level). Equipment Recommended: Rolling walker with 5" wheels; PT Goals  Acute Rehab PT Goals PT Goal Formulation: With patient Time For Goal Achievement: 2 weeks Pt will go Supine/Side to Sit: with supervision;with HOB 0 degrees PT Goal: Supine/Side to Sit - Progress: Goal set today Pt will go Sit to Supine/Side: with supervision;with HOB 0 degrees PT Goal: Sit to Supine/Side - Progress: Goal set today Pt will go Sit to Stand: with supervision;with upper extremity assist PT Goal: Sit to Stand - Progress: Goal set today Pt will go Stand to Sit: with supervision;with upper extremity assist PT Goal: Stand to Sit - Progress: Goal set today Pt will Ambulate: 16 - 50 feet;with supervision;with rolling walker (50') PT Goal: Ambulate - Progress: Goal set today Pt will Perform Home Exercise Program: with supervision, verbal cues required/provided PT Goal: Perform Home Exercise Program - Progress: Goal set today  PT Evaluation Precautions/Restrictions  Precautions Precautions: Fall Restrictions Weight Bearing Restrictions: No Prior Functioning  Home Living Lives With: Sheran Spine Help From: Family Type of Home: House Home Layout: One level Home Access: Level entry Bathroom Shower/Tub: Tub/shower unit;Curtain Bathroom Toilet: Handicapped height Bathroom Accessibility: No (Leaves walker at door and holds onto sink, TP  holder) Home Adaptive  Equipment: Walker - standard Additional Comments: Son "has trouble with his legs," so has difficulty of his own at times, but otherwise cares for hs mother. From her descriptions, sounds like nerve damage.  Prior Function Level of Independence: Needs assistance with ADLs;Needs assistance with homemaking;Requires assistive device for independence Able to Take Stairs?: No Driving: No Comments: Son does cooking, med Garment/textile technologist, daughter helps bathe several times/week, pt Mod I with gait and transfers with standard walker, recent falls Cognition Cognition Arousal/Alertness: Lethargic Overall Cognitive Status: Appears within functional limits for tasks assessed Sensation/Coordination Sensation Light Touch: Appears Intact Coordination Gross Motor Movements are Fluid and Coordinated: Yes Fine Motor Movements are Fluid and Coordinated: Yes Extremity Assessment RLE Assessment RLE Assessment: Exceptions to [facility] (Grossly 3+/5 throughout) LLE Assessment LLE Assessment: Exceptions to [facility] (Grossly 3/5 throughout) Mobility (including Balance) Bed Mobility Bed Mobility: Yes Rolling Left: 4: Min assist;With rail Rolling Left Details (indicate cue type and reason): Cues to initiate movement and assist for trunk Right Sidelying to Sit: 3: Mod assist;HOB flat;With rails Right Sidelying to Sit Details (indicate cue type and reason): Assist for lifting trunk, verbal cues for hand placement and pushing through UEs Transfers Transfers: Yes Sit to Stand: 4: Min assist;From bed;From chair/3-in-1;With upper extremity assist Sit to Stand Details (indicate cue type and reason): Cues for safe hand placement, assist for lifting Stand to Sit: 4: Min assist;With upper extremity assist;To chair/3-in-1 Stand to Sit Details: Cues to complete turn and safe hand placement, assist to control descent Ambulation/Gait Ambulation/Gait: Yes Ambulation/Gait Assistance: 4: Min assist Ambulation/Gait Assistance  Details (indicate cue type and reason): Cues for RW management and to continue walking Ambulation Distance (Feet): 10 Feet Assistive device: Rolling walker Gait Pattern: Step-through pattern;Decreased stride length;Shuffle;Trunk flexed Stairs: No  Posture/Postural Control Posture/Postural Control: No significant limitations Balance Balance Assessed: Yes Dynamic Sitting Balance Dynamic Sitting - Balance Support: No upper extremity supported;Feet supported Dynamic Sitting - Level of Assistance: 5: Stand by assistance Dynamic Sitting - Balance Activities: Lateral lean/weight shifting;Forward lean/weight shifting;Reaching for objects;Reaching across midline Static Standing Balance Static Standing - Balance Support: Bilateral upper extremity supported Static Standing - Level of Assistance: 4: Min assist Static Standing - Comment/# of Minutes: Min A for steadying during functinoal task Exercise  General Exercises - Lower Extremity Ankle Circles/Pumps: Supine;20 reps;Both;Strengthening;AROM Long Arc Quad: Seated;10 reps;Both;Strengthening;AROM (x2 sets) Heel Slides: Supine;10 reps;Both;Strengthening;AROM Hip Flexion/Marching: Seated;10 reps;Strengthening;AROM;Both End of Session PT - End of Session Equipment Utilized During Treatment: Gait belt Activity Tolerance: Patient limited by fatigue Patient left: in chair;with call bell in reach Nurse Communication: Mobility status for transfers;Mobility status for ambulation General Behavior During Session: [facility] for tasks performed Cognition: [facility] for tasks performed  [facility] [place], [place] [phone]  10/14/2011, 9:14 AM

## 2011-10-14 NOTE — Progress Notes (Addendum)
    Subjective:  Has had blood-tinged sputum and has been coughing. No shortness of breath or chest pain.  Objective:  Vital Signs in the last 24 hours: Temp:  [98.3 F (36.8 C)-99.2 F (37.3 C)] 99.2 F (37.3 C) (02/14 2100) Pulse Rate:  [54-72] 55  (02/15 0842) Resp:  [18-20] 20  (02/14 2100) BP: (124-137)/(41-72) 124/41 mmHg (02/14 2100) SpO2:  [92 %-96 %] 94 % (02/15 0842)  Intake/Output from previous day: 02/14 0701 - 02/15 0700 In: 600 [P.O.:600] Out: 1150 [Urine:1150]  Physical Exam: Pt is alert and oriented, obese woman in  NAD HEENT: normal Neck: JVP - normal, carotids 2+= without bruits Lungs: diminished BS bilateral bases CV: RRR without murmur or gallop Abd: soft, NT, Positive BS, no hepatomegaly Ext: no C/C/E, distal pulses intact and equal Skin: warm/dry no rash   Lab Results:  Basename 10/14/11 0500 10/13/11 0830  WBC 9.9 9.6  HGB 11.8* 11.8*  PLT 201 201    Basename 10/14/11 0500 10/13/11 0830  NA 138 139  K 4.6 4.4  CL 98 102  CO2 29 28  GLUCOSE 94 79  BUN 41* 38*  CREATININE 1.88* 1.69*   No results found for this basename: TROPONINI:2,CK,MB:2 in the last 72 hours  CXR: Findings: The heart is enlarged. There is tortuosity and ectasia of the thoracic aorta. Ill-defined densities in both lower lung zones could reflect pneumonia. No pleural effusion or pulmonary edema.  IMPRESSION: Probable bibasilar infiltrates.  Assessment/Plan:  1. Acute on chronic diastolic CHF  2. NSTEMI with severe 2 vessel CAD  3. PAD - left SFA disease with mild decrease in ABI  4. Type 2 DM  5. Pulmonary HTN  6. Essential HTN 7. Acute renal insufficiency 8. Cough/CXR findings raise concern of pneumonia  Because of worsening renal function in this 78 year-old diabetic patient, we cannot do 2 vessel PCI today. Will hold benicar and lasix, hydrate cautiously, and follow BUN/CR with plans for PCI early next week. Her RCA disease is critical and needs  intervention. Concerned about hemoptysis/blood-tinged sputum with use of brilinta/ASA but Hgb stable. Will continue to follow over the weekend. Pt is on levaquin which should provide good coverage of pneumonia.  Tonny Bollman, M.D. 10/14/2011, 9:13 AM

## 2011-10-15 ENCOUNTER — Other Ambulatory Visit: Payer: Self-pay

## 2011-10-15 DIAGNOSIS — I5023 Acute on chronic systolic (congestive) heart failure: Secondary | ICD-10-CM

## 2011-10-15 LAB — CBC
HCT: 35.4 % — ABNORMAL LOW (ref 36.0–46.0)
Hemoglobin: 11.1 g/dL — ABNORMAL LOW (ref 12.0–15.0)
MCH: 25.5 pg — ABNORMAL LOW (ref 26.0–34.0)
MCHC: 31.4 g/dL (ref 30.0–36.0)
MCV: 81.4 fL (ref 78.0–100.0)

## 2011-10-15 LAB — BASIC METABOLIC PANEL
BUN: 37 mg/dL — ABNORMAL HIGH (ref 6–23)
Chloride: 106 mEq/L (ref 96–112)
Glucose, Bld: 106 mg/dL — ABNORMAL HIGH (ref 70–99)
Potassium: 4.2 mEq/L (ref 3.5–5.1)

## 2011-10-15 LAB — GLUCOSE, CAPILLARY
Glucose-Capillary: 197 mg/dL — ABNORMAL HIGH (ref 70–99)
Glucose-Capillary: 115 mg/dL — ABNORMAL HIGH (ref 70–99)

## 2011-10-15 MED ORDER — POLYETHYLENE GLYCOL 3350 17 G PO PACK
17.0000 g | PACK | Freq: Every day | ORAL | Status: DC
  Administered 2011-10-15 – 2011-10-19 (×5): 17 g via ORAL
  Filled 2011-10-15 (×6): qty 1

## 2011-10-15 NOTE — Progress Notes (Signed)
Patient ID: Theresa Mclean, female   DOB: [birth year], 78 y.o.   MRN: [id number] SUBJECTIVE: Theresa Mclean complains of right wrist discomfort today. She denies any chest pain or shortness of breath. Minimal blood-tinged sputum suggested a.  Filed Vitals:   10/14/11 0842 10/14/11 1400 10/14/11 2100 10/15/11 0500  BP:  106/73 135/66 126/70  Pulse: 55 55 55 58  Temp:  98.4 F (36.9 C) 98.5 F (36.9 C) 98.1 F (36.7 C)  TempSrc:  Oral Oral Oral  Resp:  20 18 20   Height:      Weight:    102.921 kg (226 lb 14.4 oz)  SpO2: 94% 96% 99% 95%    Intake/Output Summary (Last 24 hours) at 10/15/11 1011 Last data filed at 10/15/11 1000  Gross per 24 hour  Intake    966 ml  Output   1001 ml  Net    -35 ml    LABS: Basic Metabolic Panel:  Basename 10/15/11 0630 10/14/11 0500  NA 140 138  K 4.2 4.6  CL 106 98  CO2 26 29  GLUCOSE 106* 94  BUN 37* 41*  CREATININE 1.54* 1.88*  CALCIUM 10.2 10.4  MG -- --  PHOS -- --   Liver Function Tests: No results found for this basename: AST:2,ALT:2,ALKPHOS:2,BILITOT:2,PROT:2,ALBUMIN:2 in the last 72 hours No results found for this basename: LIPASE:2,AMYLASE:2 in the last 72 hours CBC:  Basename 10/15/11 0630 10/14/11 0500  WBC 8.6 9.9  NEUTROABS -- --  HGB 11.1* 11.8*  HCT 35.4* 37.6  MCV 81.4 80.9  PLT 218 201   Cardiac Enzymes: No results found for this basename: CKTOTAL:3,CKMB:3,CKMBINDEX:3,TROPONINI:3 in the last 72 hours BNP: No components found with this basename: POCBNP:3 D-Dimer: No results found for this basename: DDIMER:2 in the last 72 hours Hemoglobin A1C: No results found for this basename: HGBA1C in the last 72 hours Fasting Lipid Panel: No results found for this basename: CHOL,HDL,LDLCALC,TRIG,CHOLHDL,LDLDIRECT in the last 72 hours Thyroid Function Tests: No results found for this basename: TSH,T4TOTAL,FREET3,T3FREE,THYROIDAB in the last 72 hours Anemia Panel: No results found for this basename:  VITAMINB12,FOLATE,FERRITIN,TIBC,IRON,RETICCTPCT in the last 72 hours  RADIOLOGY: Dg Chest 2 View  10/13/2011  *RADIOLOGY REPORT*  Clinical Data: Cough and hemoptysis.  CHEST - 2 VIEW  Comparison: None  Findings: The heart is enlarged.  There is tortuosity and ectasia of the thoracic aorta.  Ill-defined densities in both lower lung zones could reflect pneumonia.  No pleural effusion or pulmonary edema.  IMPRESSION: Probable bibasilar infiltrates.  Original Report Authenticated By: P. Loralie Champagne, M.D.    PHYSICAL EXAM Well-developed morbidly obese black female complaining of right wrist pain. It is bruised but no sign of infection. There is a small nodule present. And warmth is normal with a good pulse. She can move it when asked to.  JVD cannot be assessed. Carotids are full. Lungs are clear with some slight expiratory rhonchi. Heart reveals a poorly appreciated PMI with a soft S1-S2. Abdominal exam is morbidly obese distended with good bowel sounds. Extremities with no pitting edema with reduced pulses.  TELEMETRY: Reviewed telemetry pt in normal sinus rhythm  ASSESSMENT AND PLAN:  Active Problems:  Acute on chronic diastolic heart failure  Acute myocardial infarction, subendocardial infarction, initial episode of care  Diabetes mellitus  Theresa Mclean is awaiting PCI by Dr. Excell Seltzer this coming week. After holding meds and gently hydrating, her renal function is better. He had a creatinine are 37 and 1.5 for this morning.  Blood pressure is  still under good control despite holding meds. Her biggest complaint is right wrist with discomfort. She also is constipated and I will give her some MiraLAX. We'll check metabolic panel in the morning. Decrease IV fluids to KVO to avoid volume overload.   Valera Castle, MD 10/15/2011 10:11 AM

## 2011-10-16 DIAGNOSIS — I214 Non-ST elevation (NSTEMI) myocardial infarction: Principal | ICD-10-CM

## 2011-10-16 LAB — BASIC METABOLIC PANEL
CO2: 27 mEq/L (ref 19–32)
Calcium: 10.3 mg/dL (ref 8.4–10.5)
Chloride: 106 mEq/L (ref 96–112)
Potassium: 4.3 mEq/L (ref 3.5–5.1)
Sodium: 139 mEq/L (ref 135–145)

## 2011-10-16 LAB — GLUCOSE, CAPILLARY
Glucose-Capillary: 85 mg/dL (ref 70–99)
Glucose-Capillary: 161 mg/dL — ABNORMAL HIGH (ref 70–99)
Glucose-Capillary: 124 mg/dL — ABNORMAL HIGH (ref 70–99)

## 2011-10-16 MED ORDER — SODIUM CHLORIDE 0.9 % IV SOLN: 250.0000 mL | INTRAVENOUS | Status: DC | PRN

## 2011-10-16 MED ORDER — SODIUM CHLORIDE 0.9 % IJ SOLN: 3.0000 mL | INTRAMUSCULAR | Status: DC | PRN

## 2011-10-16 MED ORDER — DIAZEPAM 2 MG PO TABS
2.0000 mg | ORAL_TABLET | ORAL | Status: AC
  Administered 2011-10-17: 2 mg via ORAL
  Filled 2011-10-16: qty 1

## 2011-10-16 MED ORDER — SODIUM CHLORIDE 0.9 % IJ SOLN
3.0000 mL | Freq: Two times a day (BID) | INTRAMUSCULAR | Status: DC
  Administered 2011-10-17: 3 mL via INTRAVENOUS

## 2011-10-16 MED ORDER — SODIUM CHLORIDE 0.9 % IV SOLN: INTRAVENOUS | Status: DC

## 2011-10-16 NOTE — Progress Notes (Signed)
    Subjective:  Feels ok. Right wrist sore, ? Site of blood draw or previous IV. No chest pain or dyspnea.   Objective:  Vital Signs in the last 24 hours: Temp:  [97.7 F (36.5 C)-98.4 F (36.9 C)] 97.7 F (36.5 C) (02/17 0600) Pulse Rate:  [53-65] 55  (02/17 0910) Resp:  [20] 20  (02/17 0600) BP: (119-149)/(57-97) 129/70 mmHg (02/17 0910) SpO2:  [93 %-100 %] 93 % (02/17 0600)  Intake/Output from previous day: 02/16 0701 - 02/17 0700 In: 486 [P.O.:480; I.V.:6] Out: 2553 [Urine:2550; Stool:3]  Physical Exam: Pt is alert and oriented, obese woman in NAD HEENT: normal Neck: JVP - normal Lungs: CTA bilaterally CV: RRR without murmur or gallop Abd: soft, NT, Positive BS Ext: no C/C/E, distal pulses intact and equal. Ecchymoses on right palmar aspect of her wrist. No hematoma or erythema. Skin: warm/dry no rash  Lab Results:  Basename 10/15/11 0630 10/14/11 0500  WBC 8.6 9.9  HGB 11.1* 11.8*  PLT 218 201    Basename 10/16/11 0615 10/15/11 0630  NA 139 140  K 4.3 4.2  CL 106 106  CO2 27 26  GLUCOSE 122* 106*  BUN 31* 37*  CREATININE 1.29* 1.54*   No results found for this basename: TROPONINI:2,CK,MB:2 in the last 72 hours  Tele: sinus brady, no significant arrhythmia  Assessment/Plan:  1. Acute on chronic diastolic CHF  2. NSTEMI with severe 2 vessel CAD  3. PAD - left SFA disease with mild decrease in ABI  4. Type 2 DM  5. Pulmonary HTN  6. Essential HTN  7. Acute renal insufficiency 8. Bilateral lower lobe infiltrate ? Pneumonia  Pt's renal function has improved. Will plan on PCI tomorrow. She remains on ASA and brilinta and is tolerating these meds. Considering her advanced age, limited mobility, diabetes, and risk of contrast nephropathy, I think it may be best to treat her RCA lesion which is critically diseased and likely the 'culprit' for her NSTEMI. Her residual CAD can be managed medically. Will review with the interventionalist in the cath lab  tomorrow.  Tonny Bollman, M.D. 10/16/2011, 10:05 AM

## 2011-10-17 ENCOUNTER — Other Ambulatory Visit: Payer: Self-pay

## 2011-10-17 ENCOUNTER — Encounter (HOSPITAL_COMMUNITY)
Admission: AD | Disposition: A | Discharge status: Skilled Nursing Facility | Payer: Self-pay | Source: Ambulatory Visit | Attending: Cardiovascular Disease

## 2011-10-17 DIAGNOSIS — I2510 Atherosclerotic heart disease of native coronary artery without angina pectoris: Secondary | ICD-10-CM

## 2011-10-17 DIAGNOSIS — I214 Non-ST elevation (NSTEMI) myocardial infarction: Secondary | ICD-10-CM

## 2011-10-17 HISTORY — PX: PERCUTANEOUS CORONARY STENT INTERVENTION (PCI-S): SHX5485

## 2011-10-17 LAB — GLUCOSE, CAPILLARY
Glucose-Capillary: 55 mg/dL — ABNORMAL LOW (ref 70–99)
Glucose-Capillary: 155 mg/dL — ABNORMAL HIGH (ref 70–99)
Glucose-Capillary: 48 mg/dL — ABNORMAL LOW (ref 70–99)
Glucose-Capillary: 212 mg/dL — ABNORMAL HIGH (ref 70–99)

## 2011-10-17 LAB — PROTIME-INR: Prothrombin Time: 14.9 seconds (ref 11.6–15.2)

## 2011-10-17 LAB — BASIC METABOLIC PANEL
BUN: 27 mg/dL — ABNORMAL HIGH (ref 6–23)
Chloride: 108 mEq/L (ref 96–112)
GFR calc Af Amer: 50 mL/min — ABNORMAL LOW (ref >90)
GFR calc non Af Amer: 43 mL/min — ABNORMAL LOW (ref >90)
Potassium: 4.2 mEq/L (ref 3.5–5.1)

## 2011-10-17 SURGERY — PERCUTANEOUS CORONARY STENT INTERVENTION (PCI-S)
Anesthesia: LOCAL

## 2011-10-17 MED ORDER — HEPARIN (PORCINE) IN NACL 2-0.9 UNIT/ML-% IJ SOLN
INTRAMUSCULAR | Status: AC
  Filled 2011-10-17: qty 2000

## 2011-10-17 MED ORDER — BIVALIRUDIN 250 MG IV SOLR
INTRAVENOUS | Status: AC
  Filled 2011-10-17: qty 250

## 2011-10-17 MED ORDER — MIDAZOLAM HCL 2 MG/2ML IJ SOLN
INTRAMUSCULAR | Status: AC
  Filled 2011-10-17: qty 2

## 2011-10-17 MED ORDER — DEXTROSE 50 % IV SOLN
INTRAVENOUS | Status: AC
  Administered 2011-10-17: 50 mL
  Filled 2011-10-17: qty 50

## 2011-10-17 MED ORDER — SODIUM CHLORIDE 0.9 % IV SOLN
INTRAVENOUS | Status: AC
  Administered 2011-10-17: 16:00:00 via INTRAVENOUS

## 2011-10-17 MED ORDER — NITROGLYCERIN 0.2 MG/ML ON CALL CATH LAB
INTRAVENOUS | Status: AC
  Filled 2011-10-17: qty 1

## 2011-10-17 MED ORDER — DEXTROSE 50 % IV SOLN
25.0000 g | Freq: Once | INTRAVENOUS | Status: AC
  Administered 2011-10-17: 25 g via INTRAVENOUS

## 2011-10-17 MED ORDER — LIDOCAINE HCL (PF) 1 % IJ SOLN
INTRAMUSCULAR | Status: AC
  Filled 2011-10-17: qty 30

## 2011-10-17 NOTE — Progress Notes (Signed)
    Subjective:  No chest pain or dyspnea  Objective:  Vital Signs in the last 24 hours: Temp:  [98.5 F (36.9 C)-98.7 F (37.1 C)] 98.5 F (36.9 C) (02/18 0500) Pulse Rate:  [54-57] 57  (02/18 0500) Resp:  [18-20] 20  (02/18 0500) BP: (129-144)/(55-83) 130/83 mmHg (02/18 0500) SpO2:  [98 %-100 %] 98 % (02/18 0500) Weight:  [103.3 kg (227 lb 11.8 oz)] 103.3 kg (227 lb 11.8 oz) (02/18 0500)  Intake/Output from previous day: 02/17 0701 - 02/18 0700 In: 713 [P.O.:480; I.V.:233] Out: 1051 [Urine:1050; Stool:1]  Physical Exam: Pt is alert and oriented, elderly, obese woman in NAD HEENT: normal Neck: JVP - normal Lungs: CTA bilaterally CV: RRR without murmur or gallop Abd: soft, NT, Positive BS Ext: no C/C/E, distal pulses intact and equal Skin: warm/dry no rash   Lab Results:  Basename 10/15/11 0630  WBC 8.6  HGB 11.1*  PLT 218    Basename 10/17/11 0632 10/16/11 0615  NA 142 139  K 4.2 4.3  CL 108 106  CO2 27 27  GLUCOSE 46* 122*  BUN 27* 31*  CREATININE 1.17* 1.29*   No results found for this basename: TROPONINI:2,CK,MB:2 in the last 72 hours  Tele: sinus bradycardia, no significant arrhythmia  Assessment/Plan:  1. Acute on chronic diastolic CHF  2. NSTEMI with severe 2 vessel CAD  3. PAD - left SFA disease with mild decrease in ABI  4. Type 2 DM  5. Pulmonary HTN  6. Essential HTN  7. Acute renal insufficiency  8. Bilateral lower lobe infiltrate ? Pneumonia  Renal function has essentially normalized. Pt stable and tolerating ASA/Brilinta well. Plan PCI today. I think treatment of the RCA (culprit vessel) makes the most sense here. She is immobile and unlikely to have exertional angina. The RCA is critical and needs PCI to prevent vessel closure and MI. Discussed with Dr McAlhany. Will likely need home health versus SNF at discharge. Will get case manager involved. Otherwise volume status looks stable and will continue current meds. Will likely start lasix  back tomorrow.  Rangel Echeverri, M.D. 10/17/2011, 8:17 AM     

## 2011-10-17 NOTE — Progress Notes (Signed)
10/17/11 1400 Received TC from son, Theresa Mclean, about pt. discharge plans.  Theresa Mclean stated that he would like his mother to go to the [facility].  Explained to Theresa Mclean that pt. may be too weak for this intensive rehab and she may need ST SNF placement.  He stated he would  speak to his brother about SNF in [place].  TC to Ward Givens, NP to obtain order for CIR consult.  In addition, Sabino Niemann, CSW spoke with son about possible Assisted Living if pt. did not qualify for CIR.  Son stated that he would not be able to care for his mother by himself at home. Tera Mater, RN, BSN [phone]

## 2011-10-17 NOTE — H&P (View-Only) (Signed)
    Subjective:  No chest pain or dyspnea  Objective:  Vital Signs in the last 24 hours: Temp:  [98.5 F (36.9 C)-98.7 F (37.1 C)] 98.5 F (36.9 C) (02/18 0500) Pulse Rate:  [54-57] 57  (02/18 0500) Resp:  [18-20] 20  (02/18 0500) BP: (129-144)/(55-83) 130/83 mmHg (02/18 0500) SpO2:  [98 %-100 %] 98 % (02/18 0500) Weight:  [103.3 kg (227 lb 11.8 oz)] 103.3 kg (227 lb 11.8 oz) (02/18 0500)  Intake/Output from previous day: 02/17 0701 - 02/18 0700 In: 713 [P.O.:480; I.V.:233] Out: 1051 [Urine:1050; Stool:1]  Physical Exam: Pt is alert and oriented, elderly, obese woman in NAD HEENT: normal Neck: JVP - normal Lungs: CTA bilaterally CV: RRR without murmur or gallop Abd: soft, NT, Positive BS Ext: no C/C/E, distal pulses intact and equal Skin: warm/dry no rash   Lab Results:  Basename 10/15/11 0630  WBC 8.6  HGB 11.1*  PLT 218    Basename 10/17/11 0632 10/16/11 0615  NA 142 139  K 4.2 4.3  CL 108 106  CO2 27 27  GLUCOSE 46* 122*  BUN 27* 31*  CREATININE 1.17* 1.29*   No results found for this basename: TROPONINI:2,CK,MB:2 in the last 72 hours  Tele: sinus bradycardia, no significant arrhythmia  Assessment/Plan:  1. Acute on chronic diastolic CHF  2. NSTEMI with severe 2 vessel CAD  3. PAD - left SFA disease with mild decrease in ABI  4. Type 2 DM  5. Pulmonary HTN  6. Essential HTN  7. Acute renal insufficiency  8. Bilateral lower lobe infiltrate ? Pneumonia  Renal function has essentially normalized. Pt stable and tolerating ASA/Brilinta well. Plan PCI today. I think treatment of the RCA (culprit vessel) makes the most sense here. She is immobile and unlikely to have exertional angina. The RCA is critical and needs PCI to prevent vessel closure and MI. Discussed with Dr Clifton James. Will likely need home health versus SNF at discharge. Will get case manager involved. Otherwise volume status looks stable and will continue current meds. Will likely start lasix  back tomorrow.  Tonny Bollman, M.D. 10/17/2011, 8:17 AM

## 2011-10-17 NOTE — Progress Notes (Signed)
Physical Therapy Treatment Patient Details Name: Theresa Mclean MRN: [id number] DOB: 15-Oct-1928 Today's Date: 10/17/2011  PT Assessment/Plan  PT - Assessment/Plan Comments on Treatment Session: Pt making progress with PT goals (slowly).  Pt very pleasant & willing to participate in session.   PT Frequency: Min 3X/week Follow Up Recommendations: Home health PT;Supervision/Assistance - 24 hour;Skilled nursing facility (Needs to be S level to go home (set by PT on Eval)) Equipment Recommended: Rolling walker with 5" wheels;3 in 1 bedside comode PT Goals  Acute Rehab PT Goals PT Goal: Sit to Stand - Progress: Not met PT Goal: Stand to Sit - Progress: Progressing toward goal PT Goal: Ambulate - Progress: Progressing toward goal  PT Treatment Precautions/Restrictions  Precautions Precautions: Fall Restrictions Weight Bearing Restrictions: No Mobility (including Balance) Bed Mobility Bed Mobility: No Transfers Sit to Stand: 3: Mod assist;4: Min assist;Other (comment) (performed 4x's.  ) Sit to Stand Details (indicate cue type and reason): Mod (A) to achieve standing from recliner & Min (A) to stand from straight back chair.  Cues for initiation, hand placement, & technique.  Increased (A) from starting to mid point & then able to complete transition with decreased (A).   Stand to Sit: 4: Min assist Stand to Sit Details: (A) to control descent.  Cues for hand placement & technique.   Ambulation/Gait Ambulation/Gait Assistance: Other (comment) (Min Guard (A)) Ambulation/Gait Assistance Details (indicate cue type and reason): Cues to stay inside RW, side stepping technique in narrow space, upright posture, encouragement for increased step/stride length.  Required seated rest break due to fatigue.   Ambulation Distance (Feet): 30 Feet (15' + 15') Assistive device: Rolling walker Gait Pattern: Decreased step length - right;Decreased step length - left;Trunk flexed;Decreased stride  length Stairs: No Wheelchair Mobility Wheelchair Mobility: No  Posture/Postural Control Posture/Postural Control: No significant limitations Exercise    End of Session PT - End of Session Equipment Utilized During Treatment: Gait belt Activity Tolerance: Patient limited by fatigue Patient left: in chair;with call bell in reach General Behavior During Session: [facility] for tasks performed Cognition: [facility] for tasks performed  Lara Mulch 10/17/2011, 10:22 AM [phone]

## 2011-10-17 NOTE — Interval H&P Note (Signed)
History and Physical Interval Note:  10/17/2011 2:10 PM  Theresa Mclean  has presented today for cardiac cath/PCI of the RCA with the diagnosis of cp  The various methods of treatment have been discussed with the patient and family. After consideration of risks, benefits and other options for treatment, the patient has consented to  Procedure(s) (LRB): PERCUTANEOUS CORONARY STENT INTERVENTION (PCI-S) (N/A) as a surgical intervention .  The patients' history has been reviewed, patient examined, no change in status, stable for surgery.  I have reviewed the patients' chart and labs.  Questions were answered to the patient's satisfaction.     MCALHANY,CHRISTOPHER

## 2011-10-17 NOTE — Progress Notes (Signed)
Inpatient Diabetes Program Recommendations  AACE/ADA: New Consensus Statement on Inpatient Glycemic Control (2009)  Target Ranges:  Prepandial:   less than 140 mg/dL      Peak postprandial:   less than 180 mg/dL (1-2 hours)      Critically ill patients:  140 - 180 mg/dL   Results for [name], [name] (MRN [id number]) as of 10/17/2011 12:55  Ref. Range 10/17/2011 07:28 10/17/2011 08:11 10/17/2011 11:09 10/17/2011 12:01  Glucose-Capillary Latest Range: 70-99 mg/dL 53 (L) 72 55 (L) 80    Inpatient Diabetes Program Recommendations Insulin - Basal: Hypoglycemic this morning- If this trend continues, please decrease Lantus to 70 units bid. Correction (SSI): May want to add Novolog Sensitive SSI tid ac + HS.  Note: Will follow. Ambrose Finland RN, MSN, CDE Diabetes Coordinator Inpatient Diabetes Program [phone]

## 2011-10-17 NOTE — Op Note (Signed)
    Cardiac Catheterization Operative Report  [name] Sedgwick [id number] 2/18/20133:09 PM No primary provider on file.  Procedure Performed:  1. PTCA/DES x 1 mid RCA   Operator: Verne Carrow, MD  Arterial access site:  Right radial artery.   Indication:   NSTEMI, diagnostic cath last week per Dr. Excell Seltzer. Planned PCI of RCA today.                                    Procedure Details: The risks, benefits, complications, treatment options, and expected outcomes were discussed with the patient. The patient and/or family concurred with the proposed plan, giving informed consent. The patient was brought to the cath lab after IV hydration was begun and oral premedication was given. The patient was further sedated with Versed. The right wrist was assessed with an Allens test which was positive. The right wrist was prepped and draped in a sterile fashion. 1% lidocaine was used for local anesthesia. Using the modified Seldinger access technique, a 6 French sheath was placed in the right radial artery. 3 mg Verapamil was given through the sheath. An Angiomax bolus was given and a drip was started. The patient had been loaded with Brilinta already. A JR-4 guiding catheter was used to engage the RCA. When the ACT was >200, a Cougar IC wire was advanced down the RCA. A 2.5 x 20 mm balloon was used to pre-dilate the severe stenosis in the mid RCA. A 2.75 x 28 mm Promus Element DES was deployed in the mid RCA. A 2.75 x 20 mm [place] balloon was used to post-dilate the stent x 2. There was an excellent result with good flow into the distal vessel. The stenosis was taken from 99% down to 0%.   The sheath was removed from the right radial artery and a Terumo hemostasis band was applied at the arteriotomy site on the right wrist. There were no immediate complications. The patient was taken to the recovery area in stable condition.   Hemodynamic Findings: Central aortic pressure: 135/42  Impression: 1.  Successful PTCA/DES x 1 mid RCA.  Recommendations: Continue dual anti-platelet therapy for at least one year. Continue other cardiac medications as tolerated.        Complications:  None. The patient tolerated the procedure well.

## 2011-10-17 NOTE — Progress Notes (Signed)
CBG: 48   Treatment: D50 IV 50 mL  Symptoms: Hungry  Follow-up CBG: Time:1730 CBG Result:155  Possible Reasons for Event: Inadequate meal intake  Comments/MD notified:

## 2011-10-18 ENCOUNTER — Other Ambulatory Visit: Payer: Self-pay

## 2011-10-18 DIAGNOSIS — I5033 Acute on chronic diastolic (congestive) heart failure: Secondary | ICD-10-CM

## 2011-10-18 DIAGNOSIS — I214 Non-ST elevation (NSTEMI) myocardial infarction: Secondary | ICD-10-CM

## 2011-10-18 DIAGNOSIS — I509 Heart failure, unspecified: Secondary | ICD-10-CM

## 2011-10-18 DIAGNOSIS — R5381 Other malaise: Secondary | ICD-10-CM

## 2011-10-18 LAB — BASIC METABOLIC PANEL
CO2: 28 mEq/L (ref 19–32)
Calcium: 11.3 mg/dL — ABNORMAL HIGH (ref 8.4–10.5)
Chloride: 106 mEq/L (ref 96–112)
Creatinine, Ser: 1.13 mg/dL — ABNORMAL HIGH (ref 0.50–1.10)
GFR calc Af Amer: 52 mL/min — ABNORMAL LOW (ref >90)
Sodium: 140 mEq/L (ref 135–145)

## 2011-10-18 LAB — GLUCOSE, CAPILLARY: Glucose-Capillary: 84 mg/dL (ref 70–99)

## 2011-10-18 LAB — CBC
MCV: 83.4 fL (ref 78.0–100.0)
Platelets: 218 10*3/uL (ref 150–400)
RBC: 4.57 MIL/uL (ref 3.87–5.11)
RDW: 16.1 % — ABNORMAL HIGH (ref 11.5–15.5)
WBC: 8.1 10*3/uL (ref 4.0–10.5)

## 2011-10-18 MED FILL — Dextrose Inj 5%: INTRAVENOUS | Qty: 50 | Status: AC

## 2011-10-18 NOTE — Progress Notes (Signed)
Patient wants to go home with home health. Grand daughter at bedside and is encouraging patient to get more rehab before returning home. I explained that her insurance will not approve inpatient acute rehabilitation, but would likely SNF. Patient and her family to discuss further. Please call with any questions. Pager [phone]

## 2011-10-18 NOTE — Progress Notes (Addendum)
    Subjective:  No chest pain or dyspnea.  Objective:  Vital Signs in the last 24 hours: Temp:  [97.8 F (36.6 C)-98.9 F (37.2 C)] 98.9 F (37.2 C) (02/19 0547) Pulse Rate:  [44-64] 58  (02/19 0547) Resp:  [22] 22  (02/18 1554) BP: (112-164)/(37-66) 112/41 mmHg (02/19 0547) SpO2:  [92 %-100 %] 95 % (02/19 0547) Weight:  [103.4 kg (227 lb 15.3 oz)] 103.4 kg (227 lb 15.3 oz) (02/19 0044)  Intake/Output from previous day: 02/18 0701 - 02/19 0700 In: 366 [P.O.:360; I.V.:6] Out: 951 [Urine:950; Stool:1]  Physical Exam: Pt is alert and oriented, obese woman in NAD HEENT: normal Neck: JVP - normal, carotids 2+= without bruits Lungs: CTA bilaterally CV: RRR without murmur or gallop Abd: soft, NT, Positive BS, no hepatomegaly Ext: no C/C/E, right radial site clear Skin: warm/dry no rash  Lab Results:  Basename 10/18/11 0421  WBC 8.1  HGB 11.7*  PLT 218    Basename 10/18/11 0421 10/17/11 0632  NA 140 142  K 4.9 4.2  CL 106 108  CO2 28 27  GLUCOSE 163* 46*  BUN 22 27*  CREATININE 1.13* 1.17*   No results found for this basename: TROPONINI:2,CK,MB:2 in the last 72 hours  Tele: sinus brady, sinus rhythm  Assessment/Plan:  1. Acute on chronic diastolic CHF  2. NSTEMI with severe 2 vessel CAD  3. PAD - left SFA disease with mild decrease in ABI  4. Type 2 DM  5. Pulmonary HTN  6. Essential HTN  7. Acute renal insufficiency  8. Bilateral lower lobe infiltrate ? Pneumonia   Pt s/p successful PCI yesterday for treatment of severe RCA stenosis. Needs rehab or SNF at discharge. Appreciate PM&R consultation. Continue current medical therapy for CAD/NSTEMI. Stop antibiotics as she has received over 7 days of Rx and does not have compelling signs of pneumonia.  Theresa Mclean, M.D. 10/18/2011, 8:03 AM

## 2011-10-18 NOTE — Plan of Care (Signed)
Problem: Phase II Progression Outcomes Goal: Vascular site scale level 0 - I Vascular Site Scale Level 0: No bruising/bleeding/hematoma Level I (Mild): Bruising/Ecchymosis, minimal bleeding/ooozing, palpable hematoma < 3 cm Level II (Moderate): Bleeding not affecting hemodynamic parameters, pseudoaneurysm, palpable hematoma > 3 cm  Outcome: Completed/Met Date Met:  10/18/11 RT radial TR band deflation completed and band removed per protocol.  Area cleansed w/ CHG wipe and redressed w/ 2x2/tegaderm.  Area bruised but soft/flat.  Reviewed post radial cath instruction sheet with patient.  Voices understanding.  Denies complaints.

## 2011-10-18 NOTE — Progress Notes (Signed)
CARDIAC REHAB PHASE I   PRE:  Rate/Rhythm: 68SR  BP:  Supine:   Sitting: 174/44  Standing:    SaO2:   MODE:  Ambulation: 60 ft   POST:  Rate/Rhythem: 78SR  BP:  Supine:   Sitting: 191/51  Standing:    SaO2:  1055-1120 Pt walked 60 ft with rolling walker and asst x 2. Tired by end of walk. To recliner with call bell. Denied chest pain. BP elevated after walk. Pt just got meds. SOB noted with walk.  Pt states does not walk much at home. Will continue to see as asst x 2.  Duanne Limerick

## 2011-10-18 NOTE — Progress Notes (Signed)
CBG 64.  Pt states did eat supper.  RN reported she got 20 units novalog.  Hx noted of CBG dropping by morning.  Dr Armanda Magic notified, orders received to hold Lantus insulin tonight.  Pt denies complaints.  Eating snack with juice.

## 2011-10-18 NOTE — Consult Note (Signed)
Physical Medicine and Rehabilitation Consult Reason for Consult: Physical deconditioning Referring Phsyician: Dr. Cooper [name] is an 78 y.o. female.   HPI: 78 year old right-handed African American female admitted February 12 with congestive heart failure as well as mildly elevated troponin. Patient in usual state of health prior to admission ambulating short distances in the home with a walker with assistance of her son. She had noted some increased weakness over the past week. Cardiac catheterization per Dr. Excell Seltzer showed severe two-vessel coronary artery disease with severe stenosis of the right coronary artery and left circumflex. She was placed on aggressive diuresis for acute on chronic diastolic congestive heart failure. She later underwent successful PTCA after being diuresed for congestive heart failure on February 18. Placed on dual antiplatelet therapy and monitoring of cardiac status. Latest physical therapy treatment February 18 this patient was minimal assist ambulate 30 feet with a rolling walker and during said improved. Physical medicine and rehabilitation was consulted for consideration of inpatient rehabilitation services  Review of Systems  Constitutional: Positive for malaise/fatigue.  Respiratory: Positive for shortness of breath.   Cardiovascular: Positive for chest pain.  Musculoskeletal: Positive for myalgias.  All other systems reviewed and are negative.   Past Medical History  Diagnosis Date  . Hypertension   . Shortness of breath   . Nephrolithiasis   . GERD (gastroesophageal reflux disease)   . Arthritis   . Angina   . Psoriasis   . Obesity   . Mitral regurgitation   . Hyperlipidemia   . Sleep apnea     uses cpap  . CHF (congestive heart failure)   . Diabetes mellitus     insulin dependent   Past Surgical History  Procedure Date  . Acromio-clavicular joint repair 2011  . Abdominal hysterectomy   . Eye surgery     cataract  OD   History  reviewed. No pertinent family history. Social History:  reports that she has never smoked. She has never used smokeless tobacco. She reports that she does not drink alcohol or use illicit drugs. Allergies: No Known Allergies Medications Prior to Admission  Medication Dose Route Frequency Provider Last Rate Last Dose  . 0.9 %  sodium chloride infusion   Intravenous Continuous Elyn Aquas., MD      . 0.9 %  sodium chloride infusion   Intravenous Continuous Verne Carrow, MD 50 mL/hr at 10/17/11 1600    . acetaminophen (TYLENOL) tablet 650 mg  650 mg Oral Q4H PRN Darrol Jump, PA      . ALPRAZolam Prudy Feeler) tablet 0.25 mg  0.25 mg Oral BID PRN Darrol Jump, PA      . aspirin chewable tablet 324 mg  324 mg Oral Pre-Cath Darrol Jump, PA   324 mg at 10/11/11 0803  . aspirin chewable tablet 81 mg  81 mg Oral Daily Micheline Chapman, MD   81 mg at 10/17/11 1610  . atenolol (TENORMIN) tablet 100 mg  100 mg Oral Daily Darrol Jump, PA   100 mg at 10/17/11 0919  . bivalirudin (ANGIOMAX) 250 MG injection           . calcipotriene-betamethasone (TACLONEX) ointment   Topical Daily Wendall Stade, MD      . cloNIDine (CATAPRES - Dosed in mg/24 hr) patch 0.1 mg  0.1 mg Transdermal Weekly Darrol Jump, PA   0.1 mg at 10/10/11 2341  . dextrose 50 % solution 25 g  25 g Intravenous Once [employer]  McAlhany, MD   25 g at 10/17/11 1650  . dextrose 50 % solution        50 mL at 10/17/11 1830  . diazepam (VALIUM) tablet 2 mg  2 mg Oral On Call Micheline Chapman, MD   2 mg at 10/17/11 1400  . diazepam (VALIUM) tablet 5 mg  5 mg Oral Once Darrol Jump, PA   5 mg at 10/11/11 0855  . diazepam (VALIUM) tablet 5 mg  5 mg Oral On Call Micheline Chapman, MD      . esomeprazole (NEXIUM) capsule 40 mg  40 mg Oral QAC breakfast Wendall Stade, MD   40 mg at 10/17/11 0727  . fentaNYL (SUBLIMAZE) 0.05 MG/ML injection           . folic acid (FOLVITE) tablet 1 mg  1 mg Oral Daily Darrol Jump, PA   1 mg at 10/17/11 0919  . gabapentin (NEURONTIN) capsule 600 mg  600 mg Oral BID Darrol Jump, PA   600 mg at 10/17/11 2035  . heparin 2-0.9 UNIT/ML-% infusion           . heparin 2-0.9 UNIT/ML-% infusion           . hydrALAZINE (APRESOLINE) 20 MG/ML injection           . insulin aspart (novoLOG) injection 20 Units  20 Units Subcutaneous TID [facility] Micheline Chapman, MD   20 Units at 10/16/11 1716  . insulin glargine (LANTUS) injection 74 Units  74 Units Subcutaneous BID Darrol Jump, PA   74 Units at 10/17/11 2230  . levofloxacin (LEVAQUIN) tablet 500 mg  500 mg Oral q1800 Darrol Jump, PA   500 mg at 10/17/11 1839  . lidocaine (XYLOCAINE) 1 % injection           . lidocaine (XYLOCAINE) 1 % injection           . magnesium hydroxide (MILK OF MAGNESIA) suspension 5 mL  5 mL Oral Daily PRN Micheline Chapman, MD      . methotrexate [facility]) tablet 2.5 mg  2.5 mg Oral Weekly Darrol Jump, PA   2.5 mg at 10/12/11 0031  . midazolam (VERSED) 2 MG/2ML injection           . midazolam (VERSED) 2 MG/2ML injection           . montelukast (SINGULAIR) tablet 10 mg  10 mg Oral Daily Darrol Jump, PA   10 mg at 10/17/11 0920  . nitroGLYCERIN (NITROSTAT) SL tablet 0.4 mg  0.4 mg Sublingual Q5 Min x 3 PRN Darrol Jump, PA      . nitroGLYCERIN (NTG ON-CALL) 0.2 mg/mL injection           . ondansetron (ZOFRAN) injection 4 mg  4 mg Intravenous Q6H PRN Darrol Jump, PA      . ondansetron (ZOFRAN) injection 4 mg  4 mg Intravenous Q6H PRN Micheline Chapman, MD      . polyethylene glycol (MIRALAX / GLYCOLAX) packet 17 g  17 g Oral Daily Valera Castle, MD   17 g at 10/17/11 0919  . potassium chloride SA (K-DUR,KLOR-CON) CR tablet 20 mEq  20 mEq Oral Daily Darrol Jump, PA   20 mEq at 10/17/11 0920  . simvastatin (ZOCOR) tablet 20 mg  20 mg Oral QHS Darrol Jump, PA   20 mg at 10/17/11 2226  . tacrolimus (PROTOPIC) 0.1 % ointment  Topical BID Wendall Stade, MD        . Ticagrelor [facility]) tablet 90 mg  90 mg Oral BID Micheline Chapman, MD   90 mg at 10/17/11 2226  . triamterene-hydrochlorothiazide (MAXZIDE-25) 37.5-25 MG per tablet 1 each  1 each Oral Daily Darrol Jump, PA   1 each at 10/17/11 0920  . zolpidem (AMBIEN) tablet 5 mg  5 mg Oral QHS PRN Darrol Jump, PA      . DISCONTD: 0.9 %  sodium chloride infusion  250 mL Intravenous PRN Darrol Jump, PA      . DISCONTD: 0.9 %  sodium chloride infusion  250 mL Intravenous PRN Darrol Jump, PA      . DISCONTD: 0.9 %  sodium chloride infusion   Intravenous Continuous Darrol Jump, PA 75 mL/hr at 10/11/11 0401 75 mL/hr at 10/11/11 0401  . DISCONTD: 0.9 %  sodium chloride infusion  250 mL Intravenous PRN Micheline Chapman, MD      . DISCONTD: 0.9 %  sodium chloride infusion   Intravenous Continuous Valera Castle, MD 20 mL/hr at 10/16/11 0309    . DISCONTD: 0.9 %  sodium chloride infusion  250 mL Intravenous PRN Micheline Chapman, MD      . DISCONTD: 0.9 %  sodium chloride infusion   Intravenous Continuous Micheline Chapman, MD 50 mL/hr at 10/17/11 0400    . DISCONTD: acetaminophen (TYLENOL) tablet 650 mg  650 mg Oral Q4H PRN Micheline Chapman, MD      . DISCONTD: aspirin EC tablet 81 mg  81 mg Oral Daily Darrol Jump, PA      . DISCONTD: calcipotriene-betamethasone (TACLONEX) ointment 1 application  1 application Topical Daily Darrol Jump, PA      . DISCONTD: diazepam (VALIUM) tablet 5 mg  5 mg Oral On Call Darrol Jump, PA      . DISCONTD: enoxaparin (LOVENOX) injection 40 mg  40 mg Subcutaneous Q24H Darrol Jump, PA   40 mg at 10/13/11 2118  . DISCONTD: enoxaparin (LOVENOX) injection 40 mg  40 mg Subcutaneous Q24H Micheline Chapman, MD   40 mg at 10/16/11 0636  . DISCONTD: furosemide (LASIX) injection 40 mg  40 mg Intravenous Q12H Micheline Chapman, MD   40 mg at 10/13/11 0115  . DISCONTD: furosemide (LASIX) tablet 40 mg  40 mg Oral Daily Darrol Jump, PA   40 mg at  10/11/11 1257  . DISCONTD: furosemide (LASIX) tablet 40 mg  40 mg Oral Daily Micheline Chapman, MD   40 mg at 10/13/11 1022  . DISCONTD: insulin glargine (LANTUS) injection 37 Units  37 Units Subcutaneous BID Darrol Jump, PA   37 Units at 10/10/11 2323  . DISCONTD: insulin glargine (LANTUS) injection 74 Units  74 Units Subcutaneous BID Darrol Jump, PA      . DISCONTD: NON FORMULARY 40 mg  40 mg Oral QAC breakfast Darrol Jump, PA      . DISCONTD: olmesartan (BENICAR) tablet 40 mg  40 mg Oral Daily Darrol Jump, PA   40 mg at 10/13/11 1018  . DISCONTD: sodium chloride 0.9 % injection 3 mL  3 mL Intravenous Q12H Rhonda G. Barrett, PA   3 mL at 10/15/11 1000  . DISCONTD: sodium chloride 0.9 % injection 3 mL  3 mL Intravenous PRN Darrol Jump, PA      . DISCONTD: sodium chloride 0.9 % injection  3 mL  3 mL Intravenous Q12H Darrol Jump, PA   3 mL at 10/17/11 0920  . DISCONTD: sodium chloride 0.9 % injection 3 mL  3 mL Intravenous PRN Darrol Jump, PA      . DISCONTD: sodium chloride 0.9 % injection 3 mL  3 mL Intravenous Q12H Micheline Chapman, MD      . DISCONTD: sodium chloride 0.9 % injection 3 mL  3 mL Intravenous PRN Micheline Chapman, MD      . DISCONTD: sodium chloride 0.9 % injection 3 mL  3 mL Intravenous Q12H Micheline Chapman, MD   3 mL at 10/17/11 0923  . DISCONTD: sodium chloride 0.9 % injection 3 mL  3 mL Intravenous PRN Micheline Chapman, MD      . DISCONTD: tacrolimus (PROTOPIC) 0.1 % ointment 1 application  1 application Topical BID Darrol Jump, PA       No current outpatient prescriptions on file as of 10/18/2011.    Home: Home Living Lives With: Sheran Spine Help From: Family Type of Home: House Home Layout: One level Home Access: Level entry Bathroom Shower/Tub: Tub/shower unit;Curtain Bathroom Toilet: Handicapped height Bathroom Accessibility: No (Leaves walker at door and holds onto sink, TP holder) Home Adaptive Equipment: Walker -  standard Additional Comments: Son "has trouble with his legs," so has difficulty of his own at times, but otherwise cares for hs mother. From her descriptions, sounds like nerve damage.   Functional History: Prior Function Level of Independence: Needs assistance with ADLs;Needs assistance with homemaking;Requires assistive device for independence Able to Take Stairs?: No Driving: No Comments: Son does cooking, med Garment/textile technologist, daughter helps bathe several times/week, pt Mod I with gait and transfers with standard walker, recent falls Functional Status:  Mobility: Bed Mobility Bed Mobility: No Rolling Left: 4: Min assist;With rail Rolling Left Details (indicate cue type and reason): Cues to initiate movement and assist for trunk Right Sidelying to Sit: 3: Mod assist;HOB flat;With rails Right Sidelying to Sit Details (indicate cue type and reason): Assist for lifting trunk, verbal cues for hand placement and pushing through UEs Transfers Transfers: Yes Sit to Stand: 3: Mod assist;4: Min assist;Other (comment) (performed 4x's.  ) Sit to Stand Details (indicate cue type and reason): Mod (A) to achieve standing from recliner & Min (A) to stand from straight back chair.  Cues for initiation, hand placement, & technique.  Increased (A) from starting to mid point & then able to complete transition with decreased (A).   Stand to Sit: 4: Min assist Stand to Sit Details: (A) to control descent.  Cues for hand placement & technique.   Ambulation/Gait Ambulation/Gait: Yes Ambulation/Gait Assistance: Other (comment) (Min Guard (A)) Ambulation/Gait Assistance Details (indicate cue type and reason): Cues to stay inside RW, side stepping technique in narrow space, upright posture, encouragement for increased step/stride length.  Required seated rest break due to fatigue.   Ambulation Distance (Feet): 30 Feet (15' + 15') Assistive device: Rolling walker Gait Pattern: Decreased step length - right;Decreased step  length - left;Trunk flexed;Decreased stride length Stairs: No Wheelchair Mobility Wheelchair Mobility: No  ADL:    Cognition: Cognition Arousal/Alertness: Lethargic Cognition Arousal/Alertness: Lethargic Overall Cognitive Status: Appears within functional limits for tasks assessed  Blood pressure 112/41, pulse 58, temperature 98.9 F (37.2 C), temperature source Oral, resp. rate 22, height 5\' 4"  (1.626 m), weight 95.8 kg (211 lb 3.2 oz), SpO2 95.00%. Physical Exam  Vitals reviewed. Constitutional: She appears well-developed.  HENT:  Head: Normocephalic.  Neck: Normal range of motion. Neck supple. No thyromegaly present.  Cardiovascular: Regular rhythm.   Pulmonary/Chest: Effort normal. She has no wheezes.  Abdominal: She exhibits no distension. There is no tenderness.  Musculoskeletal: She exhibits edema.  Neurological: She is alert.       Alert and oriented.  Moves all 4's at 4/5. No sensory deficits. dtr's 1+. Good sitting balance  Skin: Skin is warm and dry.  Psychiatric: She has a normal mood and affect.    Results for orders placed during the hospital encounter of 10/10/11 (from the past 24 hour(s))  BASIC METABOLIC PANEL     Status: Abnormal   Collection Time   10/17/11  6:32 AM      Component Value Range   Sodium 142  135 - 145 (mEq/L)   Potassium 4.2  3.5 - 5.1 (mEq/L)   Chloride 108  96 - 112 (mEq/L)   CO2 27  19 - 32 (mEq/L)   Glucose, Bld 46 (*) 70 - 99 (mg/dL)   BUN 27 (*) 6 - 23 (mg/dL)   Creatinine, Ser 1.61 (*) 0.50 - 1.10 (mg/dL)   Calcium 09.6 (*) 8.4 - 10.5 (mg/dL)   GFR calc non Af Amer 43 (*) >90 (mL/min)   GFR calc Af Amer 50 (*) >90 (mL/min)  PROTIME-INR     Status: Normal   Collection Time   10/17/11  6:32 AM      Component Value Range   Prothrombin Time 14.9  11.6 - 15.2 (seconds)   INR 1.15  0.00 - 1.49   APTT     Status: Normal   Collection Time   10/17/11  6:32 AM      Component Value Range   aPTT 34  24 - 37 (seconds)  GLUCOSE,  CAPILLARY     Status: Abnormal   Collection Time   10/17/11  7:28 AM      Component Value Range   Glucose-Capillary 53 (*) 70 - 99 (mg/dL)   Comment 1 Documented in Chart     Comment 2 Notify RN    GLUCOSE, CAPILLARY     Status: Normal   Collection Time   10/17/11  8:11 AM      Component Value Range   Glucose-Capillary 72  70 - 99 (mg/dL)   Comment 1 Documented in Chart    GLUCOSE, CAPILLARY     Status: Abnormal   Collection Time   10/17/11 11:09 AM      Component Value Range   Glucose-Capillary 55 (*) 70 - 99 (mg/dL)   Comment 1 Documented in Chart    GLUCOSE, CAPILLARY     Status: Normal   Collection Time   10/17/11 12:01 PM      Component Value Range   Glucose-Capillary 80  70 - 99 (mg/dL)   Comment 1 Documented in Chart    GLUCOSE, CAPILLARY     Status: Abnormal   Collection Time   10/17/11  2:00 PM      Component Value Range   Glucose-Capillary 56 (*) 70 - 99 (mg/dL)  POCT ACTIVATED CLOTTING TIME     Status: Normal   Collection Time   10/17/11  2:53 PM      Component Value Range   Activated Clotting Time 595    GLUCOSE, CAPILLARY     Status: Abnormal   Collection Time   10/17/11  3:57 PM      Component Value Range   Glucose-Capillary 38 (*) 70 -  99 (mg/dL)   Comment 1 Repeat Test     Comment 2 Notify RN    GLUCOSE, CAPILLARY     Status: Abnormal   Collection Time   10/17/11  4:00 PM      Component Value Range   Glucose-Capillary 38 (*) 70 - 99 (mg/dL)   Comment 1 Notify RN    GLUCOSE, CAPILLARY     Status: Abnormal   Collection Time   10/17/11  4:29 PM      Component Value Range   Glucose-Capillary 39 (*) 70 - 99 (mg/dL)   Comment 1 Repeat Test     Comment 2 Notify RN    GLUCOSE, CAPILLARY     Status: Abnormal   Collection Time   10/17/11  4:31 PM      Component Value Range   Glucose-Capillary 48 (*) 70 - 99 (mg/dL)   Comment 1 Notify RN    GLUCOSE, CAPILLARY     Status: Abnormal   Collection Time   10/17/11  5:01 PM      Component Value Range    Glucose-Capillary 155 (*) 70 - 99 (mg/dL)  GLUCOSE, CAPILLARY     Status: Abnormal   Collection Time   10/17/11 10:10 PM      Component Value Range   Glucose-Capillary 212 (*) 70 - 99 (mg/dL)   Comment 1 Documented in Chart     Comment 2 Notify RN    CBC     Status: Abnormal   Collection Time   10/18/11  4:21 AM      Component Value Range   WBC 8.1  4.0 - 10.5 (K/uL)   RBC 4.57  3.87 - 5.11 (MIL/uL)   Hemoglobin 11.7 (*) 12.0 - 15.0 (g/dL)   HCT 16.1  09.6 - 04.5 (%)   MCV 83.4  78.0 - 100.0 (fL)   MCH 25.6 (*) 26.0 - 34.0 (pg)   MCHC 30.7  30.0 - 36.0 (g/dL)   RDW 40.9 (*) 81.1 - 15.5 (%)   Platelets 218  150 - 400 (K/uL)  BASIC METABOLIC PANEL     Status: Abnormal   Collection Time   10/18/11  4:21 AM      Component Value Range   Sodium 140  135 - 145 (mEq/L)   Potassium 4.9  3.5 - 5.1 (mEq/L)   Chloride 106  96 - 112 (mEq/L)   CO2 28  19 - 32 (mEq/L)   Glucose, Bld 163 (*) 70 - 99 (mg/dL)   BUN 22  6 - 23 (mg/dL)   Creatinine, Ser 9.14 (*) 0.50 - 1.10 (mg/dL)   Calcium 78.2 (*) 8.4 - 10.5 (mg/dL)   GFR calc non Af Amer 45 (*) >90 (mL/min)   GFR calc Af Amer 52 (*) >90 (mL/min)   No results found.  Assessment/Plan: Diagnosis: deconditioning related to CHF.  Morbid obesity 1. Does the need for close, 24 hr/day medical supervision in concert with the patient's rehab needs make it unreasonable for this patient to be served in a less intensive setting? No and Potentially 2. Co-Morbidities requiring supervision/potential complications: dm2 3. Due to bladder management, bowel management, safety, skin/wound care, disease management, medication administration, pain management and patient education, does the patient require 24 hr/day rehab nursing? No and Potentially 4. Does the patient require coordinated care of a physician, rehab nurse, PT (1-2 hrs/day, 5 days/week) and OT (1-2 hrs/day, 5 days/week) to address physical and functional deficits in the context of the above medical  diagnosis(es)?  No Addressing deficits in the following areas: balance, endurance, locomotion, strength, transferring, bathing and grooming 5. Can the patient actively participate in an intensive therapy program of at least 3 hrs of therapy per day at least 5 days per week? Potentially 6. The potential for patient to make measurable gains while on inpatient rehab is fair 7. Anticipated functional outcomes upon discharge from inpatients are ?supervision to minimal assistance 8. Estimated rehab length of stay to reach the above functional goals is: ?week or less 9. Does the patient have adequate social supports to accommodate these discharge functional goals? Potentially 10. Anticipated D/C setting: Home 11. Anticipated post D/C treatments: HH therapy 12. Overall Rehab/Functional Prognosis: excellent  RECOMMENDATIONS: This patient's condition is appropriate for continued rehabilitative care in the following setting: HH PT and OT Patient has agreed to participate in recommended program. Yes and Potentially Note that insurance prior authorization may be required for reimbursement for recommended care.  Comment: pt feels she's approaching her baseline level of movement and function.  She was quite sedentary pta.  Would prefer to go home as well.  Would recommend that son come in and observe her with therapy.  Rehab rn to f/u   Ivory Broad, MD 10/18/2011

## 2011-10-19 ENCOUNTER — Inpatient Hospital Stay (HOSPITAL_COMMUNITY): Payer: PRIVATE HEALTH INSURANCE

## 2011-10-19 ENCOUNTER — Encounter (HOSPITAL_COMMUNITY): Payer: Self-pay

## 2011-10-19 LAB — GLUCOSE, CAPILLARY
Glucose-Capillary: 38 mg/dL — CL (ref 70–99)
Glucose-Capillary: 88 mg/dL (ref 70–99)
Glucose-Capillary: 66 mg/dL — ABNORMAL LOW (ref 70–99)
Glucose-Capillary: 79 mg/dL (ref 70–99)

## 2011-10-19 LAB — BASIC METABOLIC PANEL
BUN: 21 mg/dL (ref 6–23)
Chloride: 106 mEq/L (ref 96–112)
Creatinine, Ser: 1.18 mg/dL — ABNORMAL HIGH (ref 0.50–1.10)
Glucose, Bld: 108 mg/dL — ABNORMAL HIGH (ref 70–99)
Potassium: 4.7 mEq/L (ref 3.5–5.1)

## 2011-10-19 MED ORDER — FUROSEMIDE 40 MG PO TABS
40.0000 mg | ORAL_TABLET | Freq: Every day | ORAL | Status: DC
  Administered 2011-10-19 – 2011-10-20 (×2): 40 mg via ORAL
  Filled 2011-10-19 (×2): qty 1

## 2011-10-19 MED ORDER — INSULIN GLARGINE 100 UNIT/ML ~~LOC~~ SOLN: 70.0000 [IU] | Freq: Two times a day (BID) | SUBCUTANEOUS | Status: DC

## 2011-10-19 MED ORDER — INSULIN ASPART 100 UNIT/ML ~~LOC~~ SOLN
0.0000 [IU] | Freq: Three times a day (TID) | SUBCUTANEOUS | Status: DC
  Administered 2011-10-19: 2 [IU] via SUBCUTANEOUS
  Administered 2011-10-20: 3 [IU] via SUBCUTANEOUS

## 2011-10-19 MED ORDER — INSULIN GLARGINE 100 UNIT/ML ~~LOC~~ SOLN
35.0000 [IU] | Freq: Once | SUBCUTANEOUS | Status: AC
  Administered 2011-10-19: 35 [IU] via SUBCUTANEOUS

## 2011-10-19 NOTE — Progress Notes (Signed)
CARDIAC REHAB PHASE I   PRE:  Rate/Rhythm: 72 SR    BP: sitting 170/121(? Accuracy)    SaO2:   MODE:  Ambulation: 60 ft   POST:  Rate/Rhythm: 82 with PACs    BP: sitting 191/55     SaO2: 95  RA  Ambulated assist x2 with RW. Limited by fatigue. Seemed about the same. BP high. Return to chair. Did c/o SOB.  [id number]  Harriet Masson CES, ACSM

## 2011-10-19 NOTE — Progress Notes (Signed)
Clinical Social Worker spoke with patient son over the phone to discuss patient discharge plans.  Patient son made initial contact with CSW regarding the denial from inpatient rehab and the possibility of skilled nursing facility.  Patient son stated that his preference is for [facility] in [place], [place].  CSW completed FL2 and sent referral to [facility].  Facility has received referral and is running patient insurance to confirm possible co-pays.  Admissions coordinator at [facility] plans to contact patient son to confirm insurance policies.  CSW to follow up with patient and patient family and facility regarding possible bed offer.  Patient requiring a 30 day note for [facility] due to her permanent address being in [place], [place].  CSW had attending physician complete 30 day form and has submitted it to [facility] - awaiting response.    Clinical Social Worker will remain available for support and discharge planning needs.  [address] [place], [place] [phone]

## 2011-10-19 NOTE — Progress Notes (Signed)
Patient blood sugar dropped to 66 tonight asymptomatic.Patient given snack of 15 grams carbohydrate and fruit cup .Rechecked blood sugar up 79.Patient is scheduled to receive 70 units of lantus  Insulin tonight.Call placed to Dr.Mclean.New order received to give 35 units of lantus insulin tonight. me

## 2011-10-19 NOTE — Progress Notes (Signed)
10/19/11 1613 [facility] evaluated pt. and she does not qualify, they recommended HH PT/OT.  Pt. has been ambulating in hallways.  Will give choice for HH.  Possible d/c tomorrow.  Tera Mater, RN, BSN [phone]

## 2011-10-19 NOTE — Progress Notes (Signed)
Patient Name: Theresa Mclean Date of Encounter: 10/19/2011, 11:28 AM    Subjective  Has a headache and had a coughing fit earlier. Breathing is "fair" - she isnt sure if it's worse or better than yesterday. No CP. No orthopnea.   Objective   Telemetry: NSR, sinus bradycardia Physical Exam: Filed Vitals:   10/19/11 1000  BP: 146/46  Pulse: 67  Temp: 99.5*F  Resp: 20   General: Well developed obese AAF in no acute distress. Head: Normocephalic, atraumatic, sclera non-icteric, no xanthomas, nares are without discharge.  Neck: Negative for carotid bruits. JVD not elevated. Lungs: Clear bilaterally to auscultation without wheezes, rales, or rhonchi. Breathing is unlabored. Resp rate is somewhat increased. Heart: RRR S1 S2 without murmurs, rubs, or gallops.  Abdomen: Soft, round/obese, non-distended with normoactive bowel sounds. No hepatomegaly. No rebound/guarding. No obvious abdominal masses. Msk:  Strength and tone appear normal for age. Extremities: No clubbing or cyanosis. Soft 1+ feet/ankle edema bilaterally.  Distal pedal pulses are 1+ and equal bilaterally. Radial site is free of complication. Neuro: Alert and oriented X 3. Moves all extremities spontaneously. Psych:  Responds to questions appropriately with slow response time but a normal affect.    Intake/Output Summary (Last 24 hours) at 10/19/11 1128 Last data filed at 10/19/11 0900  Gross per 24 hour  Intake   1080 ml  Output    779 ml  Net    301 ml    Labs:  Basename 10/19/11 0400 10/18/11 0421  NA 139 140  K 4.7 4.9  CL 106 106  CO2 26 28  GLUCOSE 108* 163*  BUN 21 22  CREATININE 1.18* 1.13*  CALCIUM 11.2* 11.3*  MG -- --  PHOS -- --    Basename 10/18/11 0421  WBC 8.1  NEUTROABS --  HGB 11.7*  HCT 38.1  MCV 83.4  PLT 218    Radiology/Studies:  Dg Chest 2 View 10/13/2011  *RADIOLOGY REPORT*  Clinical Data: Cough and hemoptysis.  CHEST - 2 VIEW  Comparison: None  Findings: The heart is  enlarged.  There is tortuosity and ectasia of the thoracic aorta.  Ill-defined densities in both lower lung zones could reflect pneumonia.  No pleural effusion or pulmonary edema.  IMPRESSION: Probable bibasilar infiltrates.  Original Report Authenticated By: P. Loralie Champagne, M.D.     Assessment and Plan  78 yo F transferred from [facility] for CHF, +troponin, abnormal ABIs. Cath 2/12 demonstrated 2V CAD not felt a good candidate for CABG. PV angio - severe R RAS, LSFA stenosis. Had hemoptysis & dark stools this admission with CXR concerning for PNA, tx with Levaquin. Diuresed. Underwent PTCA/DES to RCA with plans to tx other CAD medically.  1. Acute on chronic diastolic CHF, EF 14-78% at cath 2/12, no change in meds at present (although see below regarding ARI). Will defer to Dr. Excell Seltzer who has been routinely seeing patient re: diuresis. She has LEE on exam today. Is already on Maxzide. Not currently on Lasix.  2. NSTEMI with severe 2V CAD by cath 10/11/11, s/p PTCA/DES to mid RCA 10/17/11 - per Dr. Excell Seltzer, other CAD to be treated medically.  3. PAD with severe L-SFA disease - follow. Do not see plans for intervention at this time  4. DM with hypoglycemia this admission - Will decrease Lantus to 70 units bid per DM recommendation. Will D/C scheduled Novolog and instead add SSI given frequent holding of dosages and hypoglycemia. May need insulin adjustment at dc  5. Acute  renal insufficiency - slightly increased Cr today but relatively stable as compared to prior. Consider changing atenolol to a different BB given Cr. Also, given that patient is already on Maxzide, may need to d/c potassium as K is adequate at 4.7-4.9. Will await to see what Dr. Excell Seltzer decides re: #1.  6. Hemoptysis/bilat lower lobe infiltrate ?PNA, completed course of Levaquin. Low grade temp today. Initially plan was for possible d/c today but may need to see where this goes.  7. Report of dark stools earlier on in hospitalization -  hemoccult negative, Hgb stable. F/u PCP  8. Hypercalemia - Ca continually elevated on last 3 BMETs. Will defer to MD what further w/u is felt necessary this admission.  Signed, Ronie Spies PA-C   Patient seen, examined. Available data reviewed. Agree with findings, assessment, and plan as outlined by Ronie Spies, PA-C. I discussed medications and discharge plans in detail with the patient and her family. Will take off of maxzide and start daily furosemide. Check portable CXR today to rule out progressive infiltrate. She is on CPAP at home and will ask respiratory to set this up for her. Anticipate discharge tomorrow. Cardiac status stable.  Tonny Bollman, M.D. 10/19/2011 2:22 PM

## 2011-10-19 NOTE — Progress Notes (Signed)
Placed patient on cpap 14cmH20 with 2lpm 02 bleed in. Patient is tolerating cpap well. RT will continue to monitor.

## 2011-10-19 NOTE — Progress Notes (Signed)
PT Cancellation Note  Treatment cancelled today due to pt very groggy, having trouble staying awake even to converse.  Pt has ambulated several times already today.  Will let her rest this afternoon and check back tomorrow.Pecola Leisure, Benetta Spar  [phone] 10/19/2011, 3:30 PM

## 2011-10-20 ENCOUNTER — Encounter (HOSPITAL_COMMUNITY): Payer: Self-pay | Admitting: Physician Assistant

## 2011-10-20 LAB — GLUCOSE, CAPILLARY: Glucose-Capillary: 47 mg/dL — ABNORMAL LOW (ref 70–99)

## 2011-10-20 LAB — COMPREHENSIVE METABOLIC PANEL
ALT: 16 U/L (ref 0–35)
Alkaline Phosphatase: 57 U/L (ref 39–117)
CO2: 27 mEq/L (ref 19–32)
Chloride: 104 mEq/L (ref 96–112)
GFR calc Af Amer: 51 mL/min — ABNORMAL LOW (ref >90)
GFR calc non Af Amer: 44 mL/min — ABNORMAL LOW (ref >90)
Glucose, Bld: 88 mg/dL (ref 70–99)
Potassium: 4.2 mEq/L (ref 3.5–5.1)
Sodium: 142 mEq/L (ref 135–145)
Total Bilirubin: 0.5 mg/dL (ref 0.3–1.2)

## 2011-10-20 LAB — CBC
HCT: 36.4 % (ref 36.0–46.0)
MCHC: 31.6 g/dL (ref 30.0–36.0)
Platelets: 202 10*3/uL (ref 150–400)
RDW: 16.4 % — ABNORMAL HIGH (ref 11.5–15.5)

## 2011-10-20 MED ORDER — GABAPENTIN 600 MG PO TABS
600.0000 mg | ORAL_TABLET | Freq: Two times a day (BID) | ORAL | Status: DC
  Administered 2011-10-20: 600 mg via ORAL
  Filled 2011-10-20 (×2): qty 1

## 2011-10-20 MED ORDER — ASPIRIN 81 MG PO TBEC: 81.0000 mg | DELAYED_RELEASE_TABLET | Freq: Every day | ORAL | Status: AC

## 2011-10-20 MED ORDER — INSULIN ASPART 100 UNIT/ML ~~LOC~~ SOLN: SUBCUTANEOUS | Status: DC

## 2011-10-20 MED ORDER — INSULIN GLARGINE 100 UNIT/ML ~~LOC~~ SOLN: 35.0000 [IU] | Freq: Two times a day (BID) | SUBCUTANEOUS | Status: DC

## 2011-10-20 MED ORDER — NITROGLYCERIN 0.4 MG SL SUBL: 0.4000 mg | SUBLINGUAL_TABLET | SUBLINGUAL | Status: DC | PRN

## 2011-10-20 MED ORDER — TICAGRELOR 90 MG PO TABS: 90.0000 mg | ORAL_TABLET | Freq: Two times a day (BID) | ORAL | Status: DC

## 2011-10-20 MED ORDER — INSULIN GLARGINE 100 UNIT/ML ~~LOC~~ SOLN
35.0000 [IU] | Freq: Two times a day (BID) | SUBCUTANEOUS | Status: DC
  Administered 2011-10-20: 35 [IU] via SUBCUTANEOUS

## 2011-10-20 NOTE — Discharge Summary (Signed)
Discharge Summary   Patient ID: Theresa Mclean MRN: [id number], DOB/AGE: July 27, 1929 78 y.o. Admit date: 10/10/2011 D/C date:     10/20/2011   Primary Discharge Diagnoses:  1. Coronary artery disease - NSTEMI this admission - felt poor candidate for CABG, instead s/p PTCA/DES to mid RCA 10/17/11 2. Acute on chronic diastolic CHF - EF 55-65% by cath 10/11/11 3. Acute renal insufficiency, not on ACEI secondary to this (also has renal artery stenosis) 4. Hypercalcemia  5. Peripheral vascular disease with severe right renal artery stenosis and left SFA stenosis by PV angio 09/2011, treated medically 6. Pneumonia/bronchitis 7. Diabetes mellitus with hypoglycemia this admission  Secondary Discharge Diagnoses:  1. Morbid obesity, BMI 38 2. Hx of cellulitis 3. Nephrolithiasis 4. Obstructive sleep apnea on CPAP 5. Dyslipidemia 6. Hypertension 7. Psoriasis 8. Arthritis 9. GERD  Hospital Course: 78 y/o F with hx of DM, HTN, OSA was transferred from [facility] for management of CHF, positive troponin in the 0.20 range, and abnormal ABI's. She was initially admitted at [facility] with shortness of breath and cough, then after medical management of bronchitis was transferred to [facility]. She underwent cardiac cath 2/12 demonstrating 2V CAD. PV angio at that time demonstrated severe right renal artery stenosis and left SFA stenosis. She was not felt to be a good candidate for CABG and was thus loaded with Brilinta with plans instead to proceed with PCI pending stablization of some acute renal insufficiency after diuresis for her CHF which was felt to be diastolic in nature. She was felt stable for cath 2/18 and successfully underwent PTCA/DES x 1 to the mid RCA. She tolerated this procedure well and Cr remained relatively stable. Given her overall deconditioning, rehab was recommended. CIR did not feel that she met inpatient criteria, and instead SNF was sought. Her medications were adjusted, including  discontinuing Maxzide in lieu of Lasix. She cannot be on ACEI secondary to recent acute kidney injury (also has RAS by PV angio this admission). Today she is doing well without chest pain. She has fatigue with walking and was felt to benefit from rehab at [facility]. The patient was seen and examined today and felt stable for discharge by Dr. Excell Seltzer. We discussed her hypercalcemia that was noted the last several lab reports. We will recheck as an outpatient and if this continues to be elevated, she will need workup from primary care physician. A lab slip was given for repeat labs 10/24/11. One of her ointments, Taclonex, has been reported to cause hypercalemia and was thus discontinued. Regardless, she will be instructed to follow-up with primary doctor for continued surveillance of medical problems.  In terms of other problems addressed this admission, she was also noted to have an episode of red urine, hemoptysis, and report of dark stools and these complaints resolved by end of hospitalization. FOBT was negative and Hgb remained stable. CXR suggested bilateral infiltrates worrisome for PNA and she was thus treated with a course of Levaquin. She also had episodes of hypoglycemia felt related to meal intake prompting reduction in her Lantus. Dr. Earmon Phoenix note today says to reduce to 50 units bid but per further verbal discussion we have decided to cut this down to 35 units bid.  Discharge Vitals: Blood pressure 138/67, pulse 60, temperature 97.6 F (36.4 C), temperature source Oral, resp. rate 18, height 5\' 4"  (1.626 m), weight 223 lb 1.7 oz (101.2 kg), SpO2 94.00%.  Labs: Lab Results  Component Value Date   WBC 10.3 10/20/2011  HGB 11.5* 10/20/2011   HCT 36.4 10/20/2011   MCV 80.5 10/20/2011   PLT 202 10/20/2011     Lab 10/20/11 0434  NA 142  K 4.2  CL 104  CO2 27  BUN 21  CREATININE 1.15*  CALCIUM 11.5*  PROT 7.5  BILITOT 0.5  ALKPHOS 57  ALT 16  AST 14  GLUCOSE 88    Diagnostic  Studies/Procedures   1. Dg Chest 2 View  10/13/2011  *RADIOLOGY REPORT*  Clinical Data: Cough and hemoptysis.  CHEST - 2 VIEW  Comparison: None  Findings: The heart is enlarged.  There is tortuosity and ectasia of the thoracic aorta.  Ill-defined densities in both lower lung zones could reflect pneumonia.  No pleural effusion or pulmonary edema.  IMPRESSION: Probable bibasilar infiltrates.  Original Report Authenticated By: P. Loralie Champagne, M.D.   2. Dg Chest Port 1 View 10/19/2011  *RADIOLOGY REPORT*  Clinical Data: Shortness of breath.  PORTABLE CHEST - 1 VIEW  Comparison: 10/13/2011  Findings: Cardiomegaly remains stable.  Diffuse pulmonary interstitial prominence shows no significant change, and may be due to chronic interstitial lung disease although mild interstitial edema cannot definitely be excluded.  Mild scarring is seen at both lung bases, but there is no evidence of pulmonary consolidation or pleural effusion.  IMPRESSION:  1.  Stable diffuse pulmonary interstitial prominence; suspect chronic interstitial lung disease although mild interstitial edema cannot be excluded. 2.  Stable cardiomegaly.  Original Report Authenticated By: Danae Orleans, M.D.   3. 2D echo 10/06/11 - EF >65%. LV appears hyperdynamic. Mild-mod concentric LVH. LV diastolic filling pattern is consistent with pseudonormalization and elevated mean left atrial pressure. LA is mildly dilated. RV global systolic function is normal. Mild aortic cusp sclerosis. Mild MR. Greater than 50% respiratory change in IVC dimension.   Discharge Medications   Medication List  As of 10/20/2011 12:48 PM   STOP taking these medications         calcipotriene-betamethasone ointment      triamterene-hydrochlorothiazide 37.5-25 MG per tablet      valsartan 320 MG tablet         TAKE these medications         aspirin 81 MG EC tablet   Take 1 tablet (81 mg total) by mouth daily.      ATENOLOL PO   Take 100 mg by mouth daily.       cloNIDine 0.1 mg/24hr patch   Commonly known as: CATAPRES - Dosed in mg/24 hr   Place 1 patch onto the skin once a week.      esomeprazole 40 MG capsule   Commonly known as: NEXIUM   Take 40 mg by mouth daily before breakfast.      folic acid 1 MG tablet   Commonly known as: FOLVITE   Take 1 mg by mouth daily.      furosemide 40 MG tablet   Commonly known as: LASIX   Take 40 mg by mouth daily.      gabapentin 300 MG capsule   Commonly known as: NEURONTIN   Take 600 mg by mouth 2 (two) times daily.      insulin aspart 100 UNIT/ML injection   Commonly known as: novoLOG   SLIDING SCALE 0-9 Units, Injected subcutaneous, 3 times daily with meals.  CBG < 70: implement hypoglycemia protocol  CBG 70 - 120: 0 units  CBG 121 - 150: 1 unit  CBG 151 - 200: 2 units  CBG 201 -  250: 3 units  CBG 251 - 300: 5 units  CBG 301 - 350: 7 units  CBG 351 - 400: 9 units  CBG > 400: call MD      insulin glargine 100 UNIT/ML injection   Commonly known as: LANTUS   Inject 35 Units into the skin 2 (two) times daily.      methotrexate 2.5 MG tablet   Commonly known as: RHEUMATREX   Take 2.5 mg by mouth once a week. Caution:Chemotherapy. Protect from light. Takes on Wednesday.      montelukast 10 MG tablet   Commonly known as: SINGULAIR   Take 10 mg by mouth daily.      nitroGLYCERIN 0.4 MG SL tablet   Commonly known as: NITROSTAT   Place 1 tablet (0.4 mg total) under the tongue every 5 (five) minutes x 3 doses as needed for chest pain.      potassium chloride SA 20 MEQ tablet   Commonly known as: K-DUR,KLOR-CON   Take 20 mEq by mouth daily.      simvastatin 20 MG tablet   Commonly known as: ZOCOR   Take 20 mg by mouth at bedtime.      tacrolimus 0.1 % ointment   Commonly known as: PROTOPIC   Apply 1 application topically 2 (two) times daily.      Ticagrelor 90 MG Tabs tablet   Commonly known as: BRILINTA   Take 1 tablet (90 mg total) by mouth 2 (two) times daily.              Disposition   The patient will be discharged in stable condition to home. Discharge Orders    Future Appointments: Provider: Department: Dept Phone: Center:   10/28/2011 11:20 AM Gene Serpe, PA [facility] [phone]     Future Orders Please Complete By Expires   Diet - low sodium heart healthy      Comments:   Diabetic diet   Increase activity slowly      Scheduling Instructions:   No driving for until cleared by your doctor. No lifting over 5 lbs for 3 days. No sexual activity for 1 week. Keep procedure site clean & dry. If you notice increased pain, swelling, bleeding or pus, call/return!  You may shower, but no soaking baths/hot tubs/pools for 1 week.       Follow-up Information    Follow up with [facility]. (Dr. Margarita Mail office in [place] on 10/28/11 at 11:20am)    Contact information:   [address] [place] [address]       Follow up with Primary Care Doctor. (Within 1 week. You had an elevated calcium level that may require further workup. We stopped your Taclonex because of this. Your insulin was also adjusted and you should be discussing your diabetes with your doctor at follow-up.)       Follow up with Labwork. (Please have BMET drawn on 10/24/11 at nursing facility, results to be sent to Dr. Margarita Mail office please)            Duration of Discharge Encounter: 1 hour and 15 minutes including physician and PA time.  Signed, Ronie Spies PA-C 10/20/2011, 12:48 PM

## 2011-10-20 NOTE — Progress Notes (Signed)
    Subjective:  No chest pain. Fatigued with walking.  Objective:  Vital Signs in the last 24 hours: Temp:  [97.9 F (36.6 C)-99.5 F (37.5 C)] 98.1 F (36.7 C) (02/21 0750) Pulse Rate:  [51-59] 59  (02/21 0750) Resp:  [18-24] 20  (02/21 0750) BP: (120-153)/(36-96) 131/71 mmHg (02/21 0750) SpO2:  [93 %-100 %] 98 % (02/21 0750) Weight:  [101.2 kg (223 lb 1.7 oz)] 101.2 kg (223 lb 1.7 oz) (02/21 0125)  Intake/Output from previous day: 02/20 0701 - 02/21 0700 In: 1080 [P.O.:1080] Out: 2301 [Urine:2300; Stool:1]  Physical Exam: Pt is alert and oriented, obese woman in NAD HEENT: normal Neck: JVP - normal, carotids 2+= without bruits Lungs: CTA bilaterally CV: RRR without murmur or gallop Abd: soft, NT, Positive BS, no hepatomegaly Ext: no C/C/E, distal pulses intact and equal Skin: warm/dry no rash   Lab Results:  Basename 10/20/11 0434 10/18/11 0421  WBC 10.3 8.1  HGB 11.5* 11.7*  PLT 202 218    Basename 10/20/11 0434 10/19/11 0400  NA 142 139  K 4.2 4.7  CL 104 106  CO2 27 26  GLUCOSE 88 108*  BUN 21 21  CREATININE 1.15* 1.18*   No results found for this basename: TROPONINI:2,CK,MB:2 in the last 72 hours  Tele: sinus rhythm  Assessment/Plan:  1. Acute on chronic diastolic heart failure. Continue same Rx at discharge. I have discontinued Maxzide and started daily lasix. CXR reviewed and no evidence of pulmonary edema. No ACE because of recent acute kidney injury. Can rechallenge as outpatient. 2. NSTEMI - s/p PCI of the RCA with medical management of residual CAD 3. PAD - med Rx 4. Acute kidney injury - back to baseline 5. Diabetes - recurrent hypoglycemia. Will decrease Lantus from 70 to 50 units and continue with SSI.  Dispo: to [facility] SNF if bed available today.  Tonny Bollman, M.D. 10/20/2011, 9:35 AM

## 2011-10-20 NOTE — Progress Notes (Signed)
CSW met with patient and she is still agreeable to going to [facility]. CSW contacted the facility to let them know that patient would be transferred today. Pt's family has already been to the facility to fill out paperwork. CSW will contact them to make them aware of the d/c.   Sabino Niemann, [place] [phone]

## 2011-10-20 NOTE — Progress Notes (Signed)
Physical Therapy Treatment Patient Details Name: Theresa Mclean MRN: [id number] DOB: [birth year] Today's Date: 10/20/2011  PT Assessment/Plan  PT - Assessment/Plan Comments on Treatment Session: Pt with CHF making consistent progress and able to ambulate in halls 2x today. Pt benefits from reassurance and encouragement for max participation. Encouraged HEP and further ambulation with nursing. PT Plan: Discharge plan remains appropriate;Frequency remains appropriate PT Frequency: Min 3X/week Follow Up Recommendations: Home health PT PT Goals  Acute Rehab PT Goals PT Goal: Sit to Stand - Progress: Progressing toward goal PT Goal: Stand to Sit - Progress: Progressing toward goal PT Goal: Ambulate - Progress: Progressing toward goal PT Goal: Perform Home Exercise Program - Progress: Progressing toward goal  PT Treatment Precautions/Restrictions  Precautions Precautions: Fall Restrictions Weight Bearing Restrictions: No Mobility (including Balance) Bed Mobility Bed Mobility: No Transfers Sit to Stand: 3: Mod assist;From chair/3-in-1;With armrests Sit to Stand Details (indicate cue type and reason): cues to scoot forward to edge of chair and push with Bil UE on armrests and assist for anterior translation Stand to Sit: 4: Min assist;To chair/3-in-1 Stand to Sit Details: minguard with cues for safety as pt does not control descent to chair x 2 trials Stand Pivot Transfers: 5: Supervision Stand Pivot Transfer Details (indicate cue type and reason): BSC to chair Ambulation/Gait Ambulation/Gait: Yes Ambulation/Gait Assistance: 5: Supervision Ambulation/Gait Assistance Details (indicate cue type and reason): cues to step into RW, extend spine and increase stride. Pt with 4 standing rests during ambulation grossly 10 sec each Ambulation Distance (Feet): 200 Feet Assistive device: Rolling walker Gait Pattern: Trunk flexed;Decreased stride length;Step-through pattern Gait velocity: slowly      Exercise  General Exercises - Lower Extremity Long Arc Quad: AROM;15 reps;Both;Seated Hip ABduction/ADduction: AROM;Both;15 reps;Seated Hip Flexion/Marching: AROM;Both;Other reps (comment);Seated (15reps) End of Session PT - End of Session Equipment Utilized During Treatment: Gait belt Activity Tolerance: Patient tolerated treatment well Patient left: in chair;with call bell in reach;with family/visitor present Nurse Communication: Mobility status for transfers;Mobility status for ambulation General Behavior During Session: [facility] for tasks performed Cognition: [facility] for tasks performed  Delorse Lek 10/20/2011, 12:21 PM Toney Sang, PT [phone]

## 2011-10-20 NOTE — Progress Notes (Signed)
Blood sugar by FS 47, pt. Asymptomatic, denies Orange juice given with peanut butter and crackers, breakfast served and tolerated 100% rechecked  blood sugar 86.

## 2011-10-20 NOTE — Progress Notes (Signed)
CARDIAC REHAB PHASE I   PRE:  Rate/Rhythm: 52 SB  BP:  Supine: 148/51  Sitting:   Standing:    SaO2: 95 RA  MODE:  Ambulation: 140 ft   POST:  Rate/Rhythem: 91 SR  BP:  Supine:   Sitting: 199/55  Standing:    SaO2: 97 RA [id number]  Assisted X 2 and used walker to ambulate.Gait steady with walker,pt tires easily and grunts with any movement. BP after walk 199/55. To recliner after walk with call light in reach.She has trouble steering walker and needs reminder to stay inside walker and upright posture. Pt exhausted by end of walk.  Beatrix Fetters

## 2011-10-20 NOTE — Progress Notes (Signed)
Relayed pt's blood sugar results ( Hypoglycemia episode) with dr Excell Seltzer. Insulin dose adjusted per MD.

## 2011-10-21 NOTE — Discharge Summary (Signed)
Agree as documented. See my progress note this same date. Koren Sermersheim 10/21/2011 11:13 PM  

## 2011-10-27 ENCOUNTER — Telehealth: Payer: Self-pay | Admitting: *Deleted

## 2011-10-27 NOTE — Telephone Encounter (Signed)
Message copied by Eustace Moore on Thu Oct 27, 2011  3:45 PM ------      Message from: Rande Brunt      Created: Mon Oct 24, 2011  3:17 PM       Creatinine 1.1, K 3.6 (labs ordered by Dr Sherril Croon)

## 2011-10-27 NOTE — Telephone Encounter (Signed)
Son informed.

## 2011-10-28 ENCOUNTER — Ambulatory Visit (INDEPENDENT_AMBULATORY_CARE_PROVIDER_SITE_OTHER): Payer: PRIVATE HEALTH INSURANCE | Admitting: Physician Assistant

## 2011-10-28 ENCOUNTER — Encounter: Payer: Self-pay | Admitting: Physician Assistant

## 2011-10-28 DIAGNOSIS — I459 Conduction disorder, unspecified: Secondary | ICD-10-CM | POA: Insufficient documentation

## 2011-10-28 DIAGNOSIS — R001 Bradycardia, unspecified: Secondary | ICD-10-CM | POA: Insufficient documentation

## 2011-10-28 DIAGNOSIS — I10 Essential (primary) hypertension: Secondary | ICD-10-CM

## 2011-10-28 DIAGNOSIS — I340 Nonrheumatic mitral (valve) insufficiency: Secondary | ICD-10-CM | POA: Insufficient documentation

## 2011-10-28 DIAGNOSIS — I739 Peripheral vascular disease, unspecified: Secondary | ICD-10-CM | POA: Insufficient documentation

## 2011-10-28 DIAGNOSIS — I779 Disorder of arteries and arterioles, unspecified: Secondary | ICD-10-CM | POA: Insufficient documentation

## 2011-10-28 DIAGNOSIS — I5032 Chronic diastolic (congestive) heart failure: Secondary | ICD-10-CM

## 2011-10-28 DIAGNOSIS — I059 Rheumatic mitral valve disease, unspecified: Secondary | ICD-10-CM | POA: Insufficient documentation

## 2011-10-28 DIAGNOSIS — I498 Other specified cardiac arrhythmias: Secondary | ICD-10-CM

## 2011-10-28 DIAGNOSIS — N189 Chronic kidney disease, unspecified: Secondary | ICD-10-CM | POA: Insufficient documentation

## 2011-10-28 DIAGNOSIS — I2510 Atherosclerotic heart disease of native coronary artery without angina pectoris: Secondary | ICD-10-CM | POA: Insufficient documentation

## 2011-10-28 DIAGNOSIS — I509 Heart failure, unspecified: Secondary | ICD-10-CM

## 2011-10-28 DIAGNOSIS — E785 Hyperlipidemia, unspecified: Secondary | ICD-10-CM | POA: Insufficient documentation

## 2011-10-28 MED ORDER — ATENOLOL 50 MG PO TABS: 50.0000 mg | ORAL_TABLET | Freq: Every day | ORAL | Status: DC

## 2011-10-28 MED ORDER — ISOSORBIDE MONONITRATE ER 30 MG PO TB24: 30.0000 mg | ORAL_TABLET | Freq: Every day | ORAL | Status: DC

## 2011-10-28 MED ORDER — SIMVASTATIN 40 MG PO TABS: 40.0000 mg | ORAL_TABLET | Freq: Every evening | ORAL | Status: DC

## 2011-10-28 NOTE — Assessment & Plan Note (Signed)
Recommend surveillance carotid Dopplers in 6 months.

## 2011-10-28 NOTE — Assessment & Plan Note (Addendum)
Most recent lipid profile, from this month, notable for HDL 28 and LDL 83. Patient currently on simvastatin 20 mg daily. Aggressive management recommended with target LDL 70 or less, if feasible. Will increase Zocor to 40 mg daily, and reassess lipid status with a FLP in 12 weeks.

## 2011-10-28 NOTE — Patient Instructions (Signed)
   Decrease Atenolol to 50mg  daily  Start Imdur 30mg  daily  Start I/O's & daily weights  Increase Zocor to 40mg  every evening   FLP & LFT in 12 weeks. Follow up in  2 months

## 2011-10-28 NOTE — Progress Notes (Signed)
HPI: Patient presents for post hospital followup, and to establish here in our clinic.  She was referred to [place] as a new patient here at [facility], on February 7, for evaluation of progressive dyspnea. She also ruled in for NST EMI (type II), with peak troponin 0.20/NL MBs. Her dyspnea was felt to be multifactorial, due to acute bronchitis, as well as acute DHF. A 2-D echo indicated EF 60% with mild MR. She was treated with IV Lasix, but developed worsening RI, with a peak creatinine of 1.7, resulting in cessation of Lasix. She was treated for NST EMI with IV heparin  (x48 hours). We also ordered carotid Dopplers, yielding 50-69% LICA stenosis, less than 50% on the right. LE arterial Dopplers were also performed, yielding 0.7 left; 1.0 right.  Following stabilization, arrangements were made for transfer to [facility] for R./L. catheterization and peripheral angiogram. She was found to have 2v CAD, as well as severe right RAS and left SFA stenosis. She was felt to be a poor candidate for CABG, and was placed on [place] overload, with plans for staged PCI. She underwent DES mid RCA, 2/18, and DC'd home 3 days later. Creatinine 1.2 at discharge. ACE inhibitor deferred, secondary to RI and underlying severe RAS. Also noted to have hypercalcemia (11.5), and was taken off Taclonex, with recommendation to followup with primary M.D. Also had transient hematuria, hemoptysis, but maintained stable Hgb with negative FOBT. Hgb 11.5 at discharge. She also treated with Levaquin for PNA.   2-D echo was obtained: EF greater than 65%, moderate LVH; normal RVF; mild MR.  Following discharge, post hospital labs, 2/25, notable for potassium 3.6, BUN 23, creatinine 1.1, and normalized calcium 9.7.   Patient is currently at [facility] for rehabilitation. She presents with her daughter, who confirms that patient will be returning to her home afterwards, where she lives with her son. In the meanwhile, she denies  any CP, significant SOB, PND, or orthopnea. Her daughter points out that the patient had some jaw pain a few days ago, which lasted a few hours. There was no associated CP. The patient also reports dysphagia.  Of note, the daughter also points out some recent reported pulse readings in the 45 bpm range.  No Known Allergies  Current Outpatient Prescriptions  Medication Sig Dispense Refill  . aspirin 81 MG EC tablet Take 1 tablet (81 mg total) by mouth daily.      Marland Kitchen atenolol (TENORMIN) 100 MG tablet Take 100 mg by mouth daily.      . cloNIDine (CATAPRES - DOSED IN MG/24 HR) 0.1 mg/24hr patch Place 1 patch onto the skin once a week.      . folic acid (FOLVITE) 1 MG tablet Take 1 mg by mouth daily.      . furosemide (LASIX) 40 MG tablet Take 40 mg by mouth daily.      Marland Kitchen gabapentin (NEURONTIN) 300 MG capsule Take 600 mg by mouth 2 (two) times daily.      Marland Kitchen ibuprofen (ADVIL,MOTRIN) 400 MG tablet Take 400 mg by mouth every 8 (eight) hours as needed.      . insulin aspart (NOVOLOG) 100 UNIT/ML injection Inject into the skin 3 (three) times daily before meals. Per sliding scale      . insulin aspart (NOVOLOG) 100 UNIT/ML injection SLIDING SCALE 0-9 Units, Injected subcutaneous, 3 times daily with meals. CBG < 70: implement hypoglycemia protocol CBG 70 - 120: 0 units CBG 121 - 150: 1 unit CBG 151 -  200: 2 units CBG 201 - 250: 3 units CBG 251 - 300: 5 units CBG 301 - 350: 7 units CBG 351 - 400: 9 units CBG > 400: call MD      . insulin glargine (LANTUS) 100 UNIT/ML injection Inject into the skin 2 (two) times daily.       . methotrexate (RHEUMATREX) 2.5 MG tablet Take 2.5 mg by mouth once a week. Caution:Chemotherapy. Protect from light. Takes on Wednesday.      . montelukast (SINGULAIR) 10 MG tablet Take 10 mg by mouth daily.      . nitroGLYCERIN (NITROSTAT) 0.4 MG SL tablet Place 1 tablet (0.4 mg total) under the tongue every 5 (five) minutes x 3 doses as needed for chest pain.  25 tablet  4  .  omeprazole (PRILOSEC) 40 MG capsule Take 40 mg by mouth daily.      . potassium chloride SA (K-DUR,KLOR-CON) 20 MEQ tablet Take 20 mEq by mouth daily.      . simvastatin (ZOCOR) 20 MG tablet Take 20 mg by mouth every evening.      . Ticagrelor (BRILINTA) 90 MG TABS tablet Take 1 tablet (90 mg total) by mouth 2 (two) times daily.  60 tablet  6    Past Medical History  Diagnosis Date  . Hypertension   . Nephrolithiasis   . GERD (gastroesophageal reflux disease)   . Arthritis   . Psoriasis   . Morbid obesity   . Mitral regurgitation   . Hyperlipidemia   . Sleep apnea     uses cpap  . CHF (congestive heart failure)     Diastolic. EF 55-65% by cath 10/11/11  . Diabetes mellitus     insulin dependent  . Acute renal insufficiency     09/2011. not on ACEI secondary to this (also has renal artery stenosis)  . Peripheral vascular disease     severe right renal artery stenosis and left SFA stenosis by PV angio 09/2011, treated medically  . Cellulitis   . CAD (coronary artery disease)     NSTEMI 09/2011 felt poor candidate for CABG, instead s/p PTCA/DES to mid RCA 10/17/11    Past Surgical History  Procedure Date  . Acromio-clavicular joint repair 2011  . Abdominal hysterectomy   . Eye surgery     cataract  OD    History   Social History  . Marital Status: Divorced    Spouse Name: N/A    Number of Children: N/A  . Years of Education: N/A   Occupational History  . Not on file.   Social History Main Topics  . Smoking status: Never Smoker   . Smokeless tobacco: Never Used  . Alcohol Use: No  . Drug Use: No  . Sexually Active: No   Other Topics Concern  . Not on file   Social History Narrative  . No narrative on file    No family history on file.  ROS: no nausea, vomiting; no fever, chills; no melena, hematochezia; no claudication  PHYSICAL EXAM: BP 155/70  Pulse 54  Ht 5\' 4"  (1.626 m)  Wt 225 lb (102.059 kg)  BMI 38.62 kg/m2  SpO2 97% GENERAL: 78 year old  female, obese, sitting upright in wheelchair; NAD HEENT: NCAT, PERRLA, EOMI; sclera clear; no xanthelasma NECK: no JVD; no TM LUNGS: Faint right base crackles, no wheezes CARDIAC: RRR (S1, S2); no significant murmurs; no rubs or gallops ABDOMEN: Protuberant EXTREMETIES: no significant peripheral edema SKIN: warm/dry; no obvious rash/lesions MUSCULOSKELETAL: no joint  deformity NEURO: no focal deficit; NL affect   EKG:    ASSESSMENT & PLAN:

## 2011-10-28 NOTE — Assessment & Plan Note (Addendum)
Would not treat with ACE inhibitor, secondary to underlying CKD and severe RAS. We'll continue to monitor closely. Will consider adding Norvasc at time of next OV, if BP remains elevated. If so, then we'll need to substitute simvastatin with a different statin.

## 2011-10-28 NOTE — Assessment & Plan Note (Signed)
Mild by recent echocardiography

## 2011-10-28 NOTE — Assessment & Plan Note (Signed)
Will add Imdur 30 mg daily, for antianginal treatment. Patient's recent episode of jaw pain may have been an anginal equivalent. We'll continue to monitor closely.

## 2011-10-28 NOTE — Assessment & Plan Note (Signed)
Will decrease atenolol to 50 mg daily.

## 2011-10-28 NOTE — Assessment & Plan Note (Signed)
Recommend close monitoring of volume status with strict I/Os and daily weights. Continue current dose of Lasix and supplemental potassium.

## 2011-10-28 NOTE — Assessment & Plan Note (Signed)
Stable renal function by recent post hospital followup labs, with BUN 23, creatinine 1.1.

## 2011-10-28 NOTE — Assessment & Plan Note (Signed)
Medical management recommended

## 2012-01-19 ENCOUNTER — Encounter: Payer: Self-pay | Admitting: Cardiology

## 2012-01-19 ENCOUNTER — Ambulatory Visit (INDEPENDENT_AMBULATORY_CARE_PROVIDER_SITE_OTHER): Payer: PRIVATE HEALTH INSURANCE | Admitting: Cardiology

## 2012-01-19 VITALS — BP 161/68 | HR 55 | Ht 64.0 in | Wt 216.8 lb

## 2012-01-19 DIAGNOSIS — G4733 Obstructive sleep apnea (adult) (pediatric): Secondary | ICD-10-CM

## 2012-01-19 DIAGNOSIS — I340 Nonrheumatic mitral (valve) insufficiency: Secondary | ICD-10-CM

## 2012-01-19 DIAGNOSIS — I739 Peripheral vascular disease, unspecified: Secondary | ICD-10-CM

## 2012-01-19 DIAGNOSIS — I5032 Chronic diastolic (congestive) heart failure: Secondary | ICD-10-CM

## 2012-01-19 DIAGNOSIS — I779 Disorder of arteries and arterioles, unspecified: Secondary | ICD-10-CM

## 2012-01-19 DIAGNOSIS — N189 Chronic kidney disease, unspecified: Secondary | ICD-10-CM

## 2012-01-19 DIAGNOSIS — I059 Rheumatic mitral valve disease, unspecified: Secondary | ICD-10-CM

## 2012-01-19 DIAGNOSIS — I2510 Atherosclerotic heart disease of native coronary artery without angina pectoris: Secondary | ICD-10-CM

## 2012-01-19 MED ORDER — ISOSORBIDE MONONITRATE ER 60 MG PO TB24: 60.0000 mg | ORAL_TABLET | Freq: Every day | ORAL | Status: DC

## 2012-01-19 MED ORDER — FUROSEMIDE 80 MG PO TABS: 80.0000 mg | ORAL_TABLET | Freq: Every day | ORAL | Status: DC

## 2012-01-19 NOTE — Patient Instructions (Addendum)
Your physician recommends that you schedule a follow-up appointment in: 6 months. You will receive a reminder letter in the mail in about 4 months reminding you to call and schedule your appointment. If you don't receive this letter, please contact our office.   Your physician has recommended you make the following change in your medication: START TAKING ISOSORBIDE 60 MG DAILY IN THE MORNING. You may take (2) of your 30 mg together in the mornings until they are finished. Your new prescription has been sent to your pharmacy. INCREASE FUROSEMIDE TO 80 MG FOR 3 DAYS, THEN GO BACK TO 40 MG DAILY. Take (2) of your 40 mg tablets.

## 2012-02-18 DIAGNOSIS — G4733 Obstructive sleep apnea (adult) (pediatric): Secondary | ICD-10-CM | POA: Insufficient documentation

## 2012-02-18 NOTE — Assessment & Plan Note (Signed)
followup echocardiogram in the future. 

## 2012-02-18 NOTE — Assessment & Plan Note (Signed)
Patient compliant with CPAP

## 2012-02-18 NOTE — Progress Notes (Signed)
Peyton Bottoms, MD, [facility]IM Board Certified in Adult Cardiovascular Medicine,Internal Medicine and Critical Care Medicine    CC: followup patient with a history of non-ST elevation myocardial infarction not candidate for coronary bypass grafting  HPI:  Patient has history of significant coronary artery disease and presented with a non-ST elevation myocardial infarction earlier this year.  She received a drug-eluting stent to the mid RCA.  She presented with acute on chronic diastolic heart failure.  She also has significant peripheral vascular disease both carotid artery disease and lower extremity disease which is treated medically. The patient states that she feels tired often.  She complained of occasional left-sided numbness and tightness in the left arm which lasts less than 5 min.  She reports however no chest pain.  Left arm pain appears to resolve with sublingual nitroglycerin.  The patient typically uses a walker to move around from room to room.  She denies any PND.  She appears to be somewhat volume overloaded.  She's had some increasing shortness of breath.  PMH: reviewed and listed in Problem List in Electronic Records (and see below) Past Medical History  Diagnosis Date  . Hypertension   . Nephrolithiasis   . GERD (gastroesophageal reflux disease)   . Arthritis   . Psoriasis   . Morbid obesity   . Mitral regurgitation   . Hyperlipidemia   . Sleep apnea     uses cpap  . CHF (congestive heart failure)     Diastolic. EF 55-65% by cath 10/11/11  . Diabetes mellitus     insulin dependent  . Acute renal insufficiency     09/2011. not on ACEI secondary to this (also has renal artery stenosis)  . Peripheral vascular disease     severe right renal artery stenosis and left SFA stenosis by PV angio 09/2011, treated medically  . Cellulitis   . CAD (coronary artery disease)     NSTEMI 09/2011 felt poor candidate for CABG, instead s/p PTCA/DES to mid RCA 10/17/11   Past Surgical  History  Procedure Date  . Acromio-clavicular joint repair 2011  . Abdominal hysterectomy   . Eye surgery     cataract  OD    Allergies/SH/FHX : available in Electronic Records for review  No Known Allergies History   Social History  . Marital Status: Divorced    Spouse Name: N/A    Number of Children: N/A  . Years of Education: N/A   Occupational History  . Not on file.   Social History Main Topics  . Smoking status: Never Smoker   . Smokeless tobacco: Never Used  . Alcohol Use: No  . Drug Use: No  . Sexually Active: No   Other Topics Concern  . Not on file   Social History Narrative  . No narrative on file   No family history on file.  Medications: Current Outpatient Prescriptions  Medication Sig Dispense Refill  . aspirin 81 MG EC tablet Take 1 tablet (81 mg total) by mouth daily.      Marland Kitchen atenolol (TENORMIN) 50 MG tablet Take 1 tablet (50 mg total) by mouth daily.      Marland Kitchen atorvastatin (LIPITOR) 20 MG tablet Take 20 mg by mouth daily.      . cloNIDine (CATAPRES - DOSED IN MG/24 HR) 0.2 mg/24hr patch Place 1 patch onto the skin once a week.      . docusate sodium (COLACE) 100 MG capsule Take 100 mg by mouth as needed.      Marland Kitchen  fexofenadine (ALLEGRA) 180 MG tablet Take 180 mg by mouth daily. During allergy seasons.      . folic acid (FOLVITE) 1 MG tablet Take 1 mg by mouth daily.      . furosemide (LASIX) 40 MG tablet Take 40 mg by mouth daily.      . furosemide (LASIX) 80 MG tablet Take 1 tablet (80 mg total) by mouth daily. FOR 3 DAYS ONLY THEN GO BACK TO 40 MG DAILY  3 tablet  0  . gabapentin (NEURONTIN) 300 MG capsule Take 600 mg by mouth 2 (two) times daily.      . insulin aspart (NOVOLOG) 100 UNIT/ML injection SLIDING SCALE 0-9 Units, Injected subcutaneous, 3 times daily with meals. CBG < 70: implement hypoglycemia protocol CBG 70 - 120: 0 units CBG 121 - 150: 1 unit CBG 151 - 200: 2 units CBG 201 - 250: 3 units CBG 251 - 300: 5 units CBG 301 - 350: 7  units CBG 351 - 400: 9 units CBG > 400: call MD      . insulin glargine (LANTUS) 100 UNIT/ML injection Inject 50 Units into the skin 2 (two) times daily.       . isosorbide mononitrate (IMDUR) 60 MG 24 hr tablet Take 1 tablet (60 mg total) by mouth daily. In the morning  30 tablet  6  . methotrexate (RHEUMATREX) 2.5 MG tablet Take 2.5 mg by mouth once a week. Caution:Chemotherapy. Protect from light. Takes on Wednesday.       . montelukast (SINGULAIR) 10 MG tablet Take 10 mg by mouth daily.      . nitroGLYCERIN (NITROSTAT) 0.4 MG SL tablet Place 1 tablet (0.4 mg total) under the tongue every 5 (five) minutes x 3 doses as needed for chest pain.  25 tablet  4  . pantoprazole (PROTONIX) 40 MG tablet Take 40 mg by mouth 2 (two) times daily.      . potassium chloride SA (K-DUR,KLOR-CON) 20 MEQ tablet Take 20 mEq by mouth daily.      . Ticagrelor (BRILINTA) 90 MG TABS tablet Take 1 tablet (90 mg total) by mouth 2 (two) times daily.  60 tablet  6    ROS: No nausea or vomiting. No fever or chills.No melena or hematochezia.No bleeding.No claudication  Physical Exam: BP 161/68  Pulse 55  Ht 5\' 4"  (1.626 m)  Wt 216 lb 12.8 oz (98.34 kg)  BMI 37.21 kg/m2 General:well-nourished female currently in no distress Neck:normal carotid upstroke with left-sided carotid bruit.  No thyromegaly no nodular thyroid.  JVP is 6 cm Lungs:clear breath sounds bilaterally.  Faint crackles at the bases no wheezing Cardiac:regular rate and rhythm with normal S1 and S2 no definite pathological murmurs.  Questionable S3 Vascular:no edema.  Difficult to palpate dorsalis pedis and posterior tibial pulses bilaterally.  No ulcerative lesions Skin:warm and dry Physcologic:normal affect  12lead ECG:no EKG obtained. Limited bedside ECHO:N/A No images are attached to the encounter.   Assessment and Plan  Chronic diastolic heart failure Patient has no evidence of volume overload.  I recommended to increase her Lasix to 80  mg for 3 days and then go back to 40 mg a day.  PAD (peripheral artery disease) Treated medically see details above.  CAD (coronary artery disease) Patient reports some left sided numbness and left arm pain lasting approximately 5 min.  Improved with sublingual nitroglycerin.  We will increase isosorbide mononitrate 60 mg by mouth daily.patient also taking ticagrelor she reports no bleeding.  Carotid  artery disease No symptoms of TIA or CVA.  This will be monitored with carotid Dopplers.  MR (mitral regurgitation) followup echocardiogram in the future.  Obstructive sleep apnea Patient compliant with CPAP.   Patient Active Problem List  Diagnosis  . Acute myocardial infarction, subendocardial infarction, initial episode of care  . Diabetes mellitus  . CAD (coronary artery disease)  . HTN (hypertension)  . HLD (hyperlipidemia)  . Carotid artery disease  . PAD (peripheral artery disease)  . CKD (chronic kidney disease)  . MR (mitral regurgitation)  . Chronic diastolic heart failure  . Bradycardia  . Obstructive sleep apnea

## 2012-02-18 NOTE — Assessment & Plan Note (Addendum)
Patient reports some left sided numbness and left arm pain lasting approximately 5 min.  Improved with sublingual nitroglycerin.  We will increase isosorbide mononitrate 60 mg by mouth daily.patient also taking ticagrelor she reports no bleeding.

## 2012-02-18 NOTE — Assessment & Plan Note (Signed)
Patient has no evidence of volume overload.  I recommended to increase her Lasix to 80 mg for 3 days and then go back to 40 mg a day.

## 2012-02-18 NOTE — Assessment & Plan Note (Signed)
Treated medically see details above.

## 2012-02-18 NOTE — Assessment & Plan Note (Signed)
No symptoms of TIA or CVA.  This will be monitored with carotid Dopplers.

## 2012-02-21 DIAGNOSIS — R079 Chest pain, unspecified: Secondary | ICD-10-CM

## 2012-02-28 DIAGNOSIS — L409 Psoriasis, unspecified: Secondary | ICD-10-CM | POA: Insufficient documentation

## 2012-07-03 ENCOUNTER — Encounter (INDEPENDENT_AMBULATORY_CARE_PROVIDER_SITE_OTHER): Payer: Self-pay | Admitting: *Deleted

## 2012-07-03 ENCOUNTER — Encounter (INDEPENDENT_AMBULATORY_CARE_PROVIDER_SITE_OTHER): Payer: Self-pay

## 2013-03-18 IMAGING — CR DG CHEST 2V
1 series · 1 of 1 positions shown · non-contrast
Comparison: None

CLINICAL DATA: Cough and hemoptysis.

CHEST - 2 VIEW

[view not recorded]
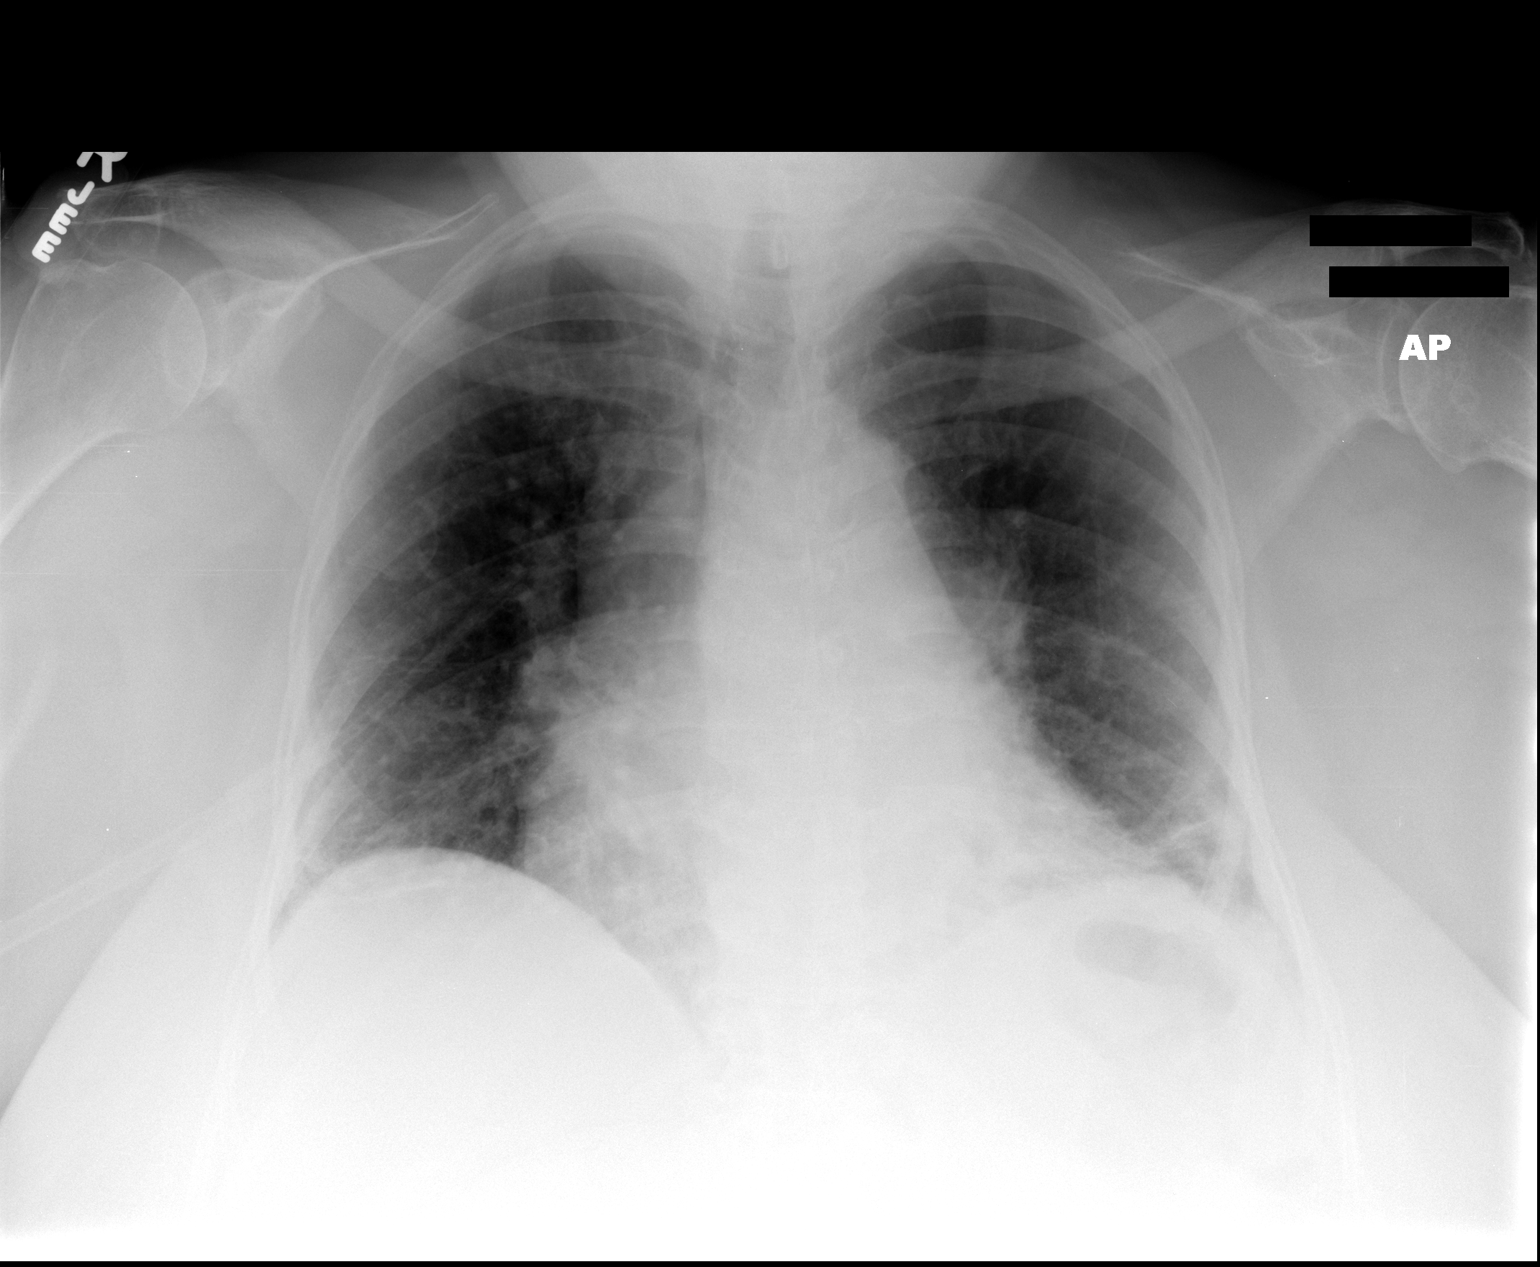

[1 of 1 positions shown; findings below may reference images not displayed]

FINDINGS: The heart is enlarged.  There is tortuosity and ectasia
of the thoracic aorta.  Ill-defined densities in both lower lung
zones could reflect pneumonia.  No pleural effusion or pulmonary
edema.
IMPRESSION: Probable bibasilar infiltrates.

## 2013-04-26 ENCOUNTER — Encounter: Payer: Self-pay | Admitting: Cardiovascular Disease

## 2013-05-02 ENCOUNTER — Encounter: Payer: Self-pay | Admitting: Cardiovascular Disease

## 2013-05-02 ENCOUNTER — Ambulatory Visit (INDEPENDENT_AMBULATORY_CARE_PROVIDER_SITE_OTHER): Payer: Medicare Other | Admitting: Cardiovascular Disease

## 2013-05-02 VITALS — BP 154/68 | HR 55 | Ht 64.0 in

## 2013-05-02 DIAGNOSIS — R001 Bradycardia, unspecified: Secondary | ICD-10-CM

## 2013-05-02 DIAGNOSIS — I5032 Chronic diastolic (congestive) heart failure: Secondary | ICD-10-CM

## 2013-05-02 DIAGNOSIS — I498 Other specified cardiac arrhythmias: Secondary | ICD-10-CM

## 2013-05-02 DIAGNOSIS — E785 Hyperlipidemia, unspecified: Secondary | ICD-10-CM

## 2013-05-02 DIAGNOSIS — I739 Peripheral vascular disease, unspecified: Secondary | ICD-10-CM

## 2013-05-02 DIAGNOSIS — I2510 Atherosclerotic heart disease of native coronary artery without angina pectoris: Secondary | ICD-10-CM

## 2013-05-02 DIAGNOSIS — I10 Essential (primary) hypertension: Secondary | ICD-10-CM

## 2013-05-02 DIAGNOSIS — N189 Chronic kidney disease, unspecified: Secondary | ICD-10-CM

## 2013-05-02 MED ORDER — HYDRALAZINE HCL 25 MG PO TABS: 25.0000 mg | ORAL_TABLET | Freq: Three times a day (TID) | ORAL | Status: DC

## 2013-05-02 NOTE — Patient Instructions (Signed)
Your physician has requested that you have an echocardiogram. Echocardiography is a painless test that uses sound waves to create images of your heart. It provides your doctor with information about the size and shape of your heart and how well your heart's chambers and valves are working. This procedure takes approximately one hour. There are no restrictions for this procedure. Your physician has recommended you begin the following medication:  Hydralazine 25mg  three times per day - new sent to pharm  Hold Plavix 5 days prior to procedure & resume as soon as possible Continue all other medications.   Office will contact with results via phone or letter.   Follow up in  4 months

## 2013-05-02 NOTE — Progress Notes (Signed)
Patient ID: Theresa Mclean, female   DOB: 09/09/1928, 79 y.o.   MRN: [id number]   SUBJECTIVE: Theresa Mclean is an 79 year old female with a history of non-ST elevation myocardial infarction in February 2013. She was treated with PTCA and DES to the mid RCA. She has multiple risk factors including diabetes mellitus and poorly controlled hypertension. She has a history of diastolic heart failure, and was recently treated for left leg cellulitis and some very mild vascular congestion at [facility] (xray reviewed) over the Labor Day weekend. An echocardiogram was reportedly not done. By catheterization, her ejection fraction was 55%-65% in February 2013. She hasn't noticed much hematochezia or melena, although this was reported over this past weekend's hospitalization.  Currently, she is doing well. She reports no chest pain either at rest or on exertion. She reports no orthopnea or PND. She has some mild SOB. She uses CPAP for sleep apnea. She has no palpitations, presyncope or syncope.  She is reportedly due for a colonoscopy in the near future.    No Known Allergies  Current Outpatient Prescriptions  Medication Sig Dispense Refill  . acitretin (SORIATANE) 10 MG capsule 10 mg. Take 1 capsule (10 mg total) by mouth daily with dinner for 30 days.      Marland Kitchen amLODipine (NORVASC) 10 MG tablet Take 10 mg by mouth daily.      Marland Kitchen aspirin 81 MG tablet Take 81 mg by mouth daily.      Marland Kitchen atenolol (TENORMIN) 50 MG tablet Take 75 mg by mouth daily.      Marland Kitchen atorvastatin (LIPITOR) 20 MG tablet Take 20 mg by mouth daily.      . Cholecalciferol (VITAMIN D-3) 1000 UNITS CAPS Take 1,000 Units by mouth daily.      . clobetasol (OLUX) 0.05 % topical foam Apply to scalp daily as needed      . cloNIDine (CATAPRES - DOSED IN MG/24 HR) 0.2 mg/24hr patch Place 1 patch onto the skin once a week.      . clopidogrel (PLAVIX) 75 MG tablet Take 75 mg by mouth daily.      Marland Kitchen docusate sodium (COLACE) 100 MG capsule Take 100 mg by mouth 2 (two)  times daily.       . fexofenadine (ALLEGRA) 180 MG tablet Take 180 mg by mouth daily. During allergy seasons.      . fluocinonide ointment (LIDEX) 0.05 % Apply to affected areas of the body daily as needed. Not to face.      . folic acid (FOLVITE) 1 MG tablet Take 1 mg by mouth daily.      . furosemide (LASIX) 40 MG tablet Take 80 mg by mouth as needed.       . gabapentin (NEURONTIN) 300 MG capsule Take 300 mg by mouth 4 (four) times daily.       . insulin aspart (NOVOLOG) 100 UNIT/ML injection SLIDING SCALE 0-9 Units, Injected subcutaneous, 3 times daily with meals. CBG < 70: implement hypoglycemia protocol CBG 70 - 120: 0 units CBG 121 - 150: 1 unit CBG 151 - 200: 2 units CBG 201 - 250: 3 units CBG 251 - 300: 5 units CBG 301 - 350: 7 units CBG 351 - 400: 9 units CBG > 400: call MD      . insulin glargine (LANTUS) 100 UNIT/ML injection Inject 53-58 Units into the skin 2 (two) times daily.       . isosorbide mononitrate (IMDUR) 30 MG 24 hr tablet Take 30 mg by  mouth daily.      . isosorbide mononitrate (IMDUR) 60 MG 24 hr tablet Take 1 tablet (60 mg total) by mouth daily. In the morning  30 tablet  6  . methotrexate (RHEUMATREX) 2.5 MG tablet Take 7.5 mg by mouth once a week. Caution:Chemotherapy. Protect from light. Takes on Wednesday.      . mometasone (NASONEX) 50 MCG/ACT nasal spray       . montelukast (SINGULAIR) 10 MG tablet Take 10 mg by mouth daily.      . nitroGLYCERIN (NITROSTAT) 0.4 MG SL tablet Place 1 tablet (0.4 mg total) under the tongue every 5 (five) minutes x 3 doses as needed for chest pain.  25 tablet  4  . pantoprazole (PROTONIX) 40 MG tablet Take 40 mg by mouth daily.       . potassium chloride SA (K-DUR,KLOR-CON) 20 MEQ tablet Take 20 mEq by mouth daily.       No current facility-administered medications for this visit.    Past Medical History  Diagnosis Date  . Hypertension   . Nephrolithiasis   . GERD (gastroesophageal reflux disease)   . Arthritis   .  Psoriasis   . Morbid obesity   . Mitral regurgitation   . Hyperlipidemia   . Sleep apnea     uses cpap  . CHF (congestive heart failure)     Diastolic. EF 55-65% by cath 10/11/11  . Diabetes mellitus     insulin dependent  . Acute renal insufficiency     09/2011. not on ACEI secondary to this (also has renal artery stenosis)  . Peripheral vascular disease     severe right renal artery stenosis and left SFA stenosis by PV angio 09/2011, treated medically  . Cellulitis   . CAD (coronary artery disease)     NSTEMI 09/2011 felt poor candidate for CABG, instead s/p PTCA/DES to mid RCA 10/17/11    Past Surgical History  Procedure Laterality Date  . Acromio-clavicular joint repair  2011  . Abdominal hysterectomy    . Eye surgery      cataract  OD    History   Social History  . Marital Status: Divorced    Spouse Name: N/A    Number of Children: N/A  . Years of Education: N/A   Occupational History  . Not on file.   Social History Main Topics  . Smoking status: Never Smoker   . Smokeless tobacco: Never Used  . Alcohol Use: No  . Drug Use: No  . Sexual Activity: No   Other Topics Concern  . Not on file   Social History Narrative  . No narrative on file    @FAMX @  Filed Vitals:   05/02/13 0832  BP: 154/68  Pulse: 55  Height: 5\' 4"  (1.626 m)    PHYSICAL EXAM General: NAD Neck: No JVD, no thyromegaly or thyroid nodule.  Lungs: Clear to auscultation bilaterally with normal respiratory effort. CV: Nondisplaced PMI.  Heart regular S1/S2, no S3/S4, no murmur.  Trace LE peripheral edema.  No carotid bruit.  Normal pedal pulses.  Abdomen: Soft, nontender, no hepatosplenomegaly, no distention.  Neurologic: Alert and oriented x 3.  Psych: Normal affect. Extremities: No clubbing or cyanosis. Legs are wrinkled with no gross erythema.  ECG: reviewed and available in electronic records.      ASSESSMENT AND PLAN: 1. CAD: stable. Continue ASA, Plavix, Lipitor, Atenolol,  and Imdur. Plavix can be held 5 days prior to her colonoscopy and then restarted. 2.  HTN: poorly controlled. Given her resting bradycardia, I am not inclined to increase her atenolol. She is not on an ACEI reportedly because of severe right renal artery stenosis. Her creatinine on 8/31 was 0.79. She is on long-acting nitrates. I will start Hydralazine 25 mg TID. 3. Chronic diastolic heart failure: will obtain an echocardiogram, and will initiate Hydralazine. She appears to be euvolemic, but given her uncontrolled HTN, I am initiating Hydralazine to avoid future decompensation. 4. PVD: severe right renal artery stenosis and left SFA stenosis by PV angio 09/2011. 5. Hyperlipidemia: on Lipitor.   Prentice Docker, M.D., F.A.C.C.

## 2013-05-09 ENCOUNTER — Other Ambulatory Visit: Payer: Self-pay

## 2013-05-09 ENCOUNTER — Other Ambulatory Visit (INDEPENDENT_AMBULATORY_CARE_PROVIDER_SITE_OTHER): Payer: Medicare Other

## 2013-05-09 DIAGNOSIS — I2510 Atherosclerotic heart disease of native coronary artery without angina pectoris: Secondary | ICD-10-CM

## 2013-05-09 DIAGNOSIS — I5032 Chronic diastolic (congestive) heart failure: Secondary | ICD-10-CM

## 2013-05-13 ENCOUNTER — Encounter: Payer: Self-pay | Admitting: *Deleted

## 2013-05-17 ENCOUNTER — Telehealth: Payer: Self-pay | Admitting: Cardiovascular Disease

## 2013-05-17 DIAGNOSIS — R531 Weakness: Secondary | ICD-10-CM

## 2013-05-17 DIAGNOSIS — R0602 Shortness of breath: Secondary | ICD-10-CM

## 2013-05-17 NOTE — Telephone Encounter (Signed)
Please obtain an echocardiogram to see if there's been an interval change in her LV function.

## 2013-05-17 NOTE — Telephone Encounter (Signed)
Please advise if you suggest doing anything different.

## 2013-05-17 NOTE — Telephone Encounter (Signed)
Mrs. Theresa Mclean became weak, sweating, short of breath on Wednesday, 05-15-13 . Son states I think She might have had a heart attack. Did not seek medical attention. States she is feeling better. Patient just wanted Dr. Purvis Sheffield to know this information.

## 2013-05-21 ENCOUNTER — Telehealth: Payer: Self-pay | Admitting: Cardiovascular Disease

## 2013-05-21 NOTE — Telephone Encounter (Signed)
Please obtain an echocardiogram to see if there's been an interval change in her LV function. Patient had echo on 05-09-13. Need to find out if her insurance will cover another echo.

## 2013-05-22 NOTE — Telephone Encounter (Signed)
No precert would be required

## 2013-05-24 NOTE — Telephone Encounter (Signed)
Patient notified.  Stated she will do another echo.  Will put order in & forward to [facility]) for scheduling.

## 2013-05-30 ENCOUNTER — Other Ambulatory Visit: Payer: Self-pay

## 2013-05-30 ENCOUNTER — Other Ambulatory Visit: Payer: Medicare Other

## 2013-05-30 ENCOUNTER — Other Ambulatory Visit (INDEPENDENT_AMBULATORY_CARE_PROVIDER_SITE_OTHER): Payer: Medicare Other

## 2013-05-30 DIAGNOSIS — R531 Weakness: Secondary | ICD-10-CM

## 2013-05-30 DIAGNOSIS — I359 Nonrheumatic aortic valve disorder, unspecified: Secondary | ICD-10-CM

## 2013-05-30 DIAGNOSIS — R0602 Shortness of breath: Secondary | ICD-10-CM

## 2013-05-30 DIAGNOSIS — I059 Rheumatic mitral valve disease, unspecified: Secondary | ICD-10-CM

## 2013-06-03 ENCOUNTER — Encounter: Payer: Self-pay | Admitting: *Deleted

## 2013-07-09 ENCOUNTER — Encounter (HOSPITAL_COMMUNITY): Payer: Self-pay | Admitting: Emergency Medicine

## 2013-07-09 ENCOUNTER — Inpatient Hospital Stay (HOSPITAL_COMMUNITY)
Admission: EM | Admit: 2013-07-09 | Discharge: 2013-07-12 | DRG: 292 | Disposition: A | Discharge status: Home Health | Payer: Medicare Other | Attending: Internal Medicine | Admitting: Internal Medicine

## 2013-07-09 ENCOUNTER — Emergency Department (HOSPITAL_COMMUNITY): Payer: Medicare Other

## 2013-07-09 DIAGNOSIS — M129 Arthropathy, unspecified: Secondary | ICD-10-CM | POA: Diagnosis present

## 2013-07-09 DIAGNOSIS — N189 Chronic kidney disease, unspecified: Secondary | ICD-10-CM | POA: Diagnosis present

## 2013-07-09 DIAGNOSIS — I214 Non-ST elevation (NSTEMI) myocardial infarction: Secondary | ICD-10-CM

## 2013-07-09 DIAGNOSIS — D509 Iron deficiency anemia, unspecified: Secondary | ICD-10-CM | POA: Diagnosis present

## 2013-07-09 DIAGNOSIS — D649 Anemia, unspecified: Secondary | ICD-10-CM | POA: Diagnosis present

## 2013-07-09 DIAGNOSIS — E785 Hyperlipidemia, unspecified: Secondary | ICD-10-CM

## 2013-07-09 DIAGNOSIS — I129 Hypertensive chronic kidney disease with stage 1 through stage 4 chronic kidney disease, or unspecified chronic kidney disease: Secondary | ICD-10-CM | POA: Diagnosis present

## 2013-07-09 DIAGNOSIS — I272 Pulmonary hypertension, unspecified: Secondary | ICD-10-CM

## 2013-07-09 DIAGNOSIS — R0600 Dyspnea, unspecified: Secondary | ICD-10-CM

## 2013-07-09 DIAGNOSIS — IMO0002 Reserved for concepts with insufficient information to code with codable children: Secondary | ICD-10-CM

## 2013-07-09 DIAGNOSIS — I509 Heart failure, unspecified: Secondary | ICD-10-CM | POA: Diagnosis present

## 2013-07-09 DIAGNOSIS — I10 Essential (primary) hypertension: Secondary | ICD-10-CM

## 2013-07-09 DIAGNOSIS — G4733 Obstructive sleep apnea (adult) (pediatric): Secondary | ICD-10-CM

## 2013-07-09 DIAGNOSIS — N058 Unspecified nephritic syndrome with other morphologic changes: Secondary | ICD-10-CM | POA: Diagnosis present

## 2013-07-09 DIAGNOSIS — I779 Disorder of arteries and arterioles, unspecified: Secondary | ICD-10-CM

## 2013-07-09 DIAGNOSIS — I5033 Acute on chronic diastolic (congestive) heart failure: Principal | ICD-10-CM

## 2013-07-09 DIAGNOSIS — R001 Bradycardia, unspecified: Secondary | ICD-10-CM

## 2013-07-09 DIAGNOSIS — Z6839 Body mass index (BMI) 39.0-39.9, adult: Secondary | ICD-10-CM

## 2013-07-09 DIAGNOSIS — Z833 Family history of diabetes mellitus: Secondary | ICD-10-CM

## 2013-07-09 DIAGNOSIS — Z9861 Coronary angioplasty status: Secondary | ICD-10-CM

## 2013-07-09 DIAGNOSIS — Z7982 Long term (current) use of aspirin: Secondary | ICD-10-CM

## 2013-07-09 DIAGNOSIS — R079 Chest pain, unspecified: Secondary | ICD-10-CM

## 2013-07-09 DIAGNOSIS — I5032 Chronic diastolic (congestive) heart failure: Secondary | ICD-10-CM

## 2013-07-09 DIAGNOSIS — I340 Nonrheumatic mitral (valve) insufficiency: Secondary | ICD-10-CM

## 2013-07-09 DIAGNOSIS — I739 Peripheral vascular disease, unspecified: Secondary | ICD-10-CM

## 2013-07-09 DIAGNOSIS — E1165 Type 2 diabetes mellitus with hyperglycemia: Secondary | ICD-10-CM | POA: Diagnosis present

## 2013-07-09 DIAGNOSIS — K5900 Constipation, unspecified: Secondary | ICD-10-CM | POA: Diagnosis present

## 2013-07-09 DIAGNOSIS — I2510 Atherosclerotic heart disease of native coronary artery without angina pectoris: Secondary | ICD-10-CM

## 2013-07-09 DIAGNOSIS — J9610 Chronic respiratory failure, unspecified whether with hypoxia or hypercapnia: Secondary | ICD-10-CM

## 2013-07-09 DIAGNOSIS — I2789 Other specified pulmonary heart diseases: Secondary | ICD-10-CM | POA: Diagnosis present

## 2013-07-09 DIAGNOSIS — Z794 Long term (current) use of insulin: Secondary | ICD-10-CM

## 2013-07-09 DIAGNOSIS — I252 Old myocardial infarction: Secondary | ICD-10-CM

## 2013-07-09 DIAGNOSIS — Z7902 Long term (current) use of antithrombotics/antiplatelets: Secondary | ICD-10-CM

## 2013-07-09 DIAGNOSIS — E1129 Type 2 diabetes mellitus with other diabetic kidney complication: Secondary | ICD-10-CM | POA: Diagnosis present

## 2013-07-09 DIAGNOSIS — K219 Gastro-esophageal reflux disease without esophagitis: Secondary | ICD-10-CM | POA: Diagnosis present

## 2013-07-09 DIAGNOSIS — J90 Pleural effusion, not elsewhere classified: Secondary | ICD-10-CM

## 2013-07-09 DIAGNOSIS — N182 Chronic kidney disease, stage 2 (mild): Secondary | ICD-10-CM

## 2013-07-09 DIAGNOSIS — Z9981 Dependence on supplemental oxygen: Secondary | ICD-10-CM

## 2013-07-09 DIAGNOSIS — M25519 Pain in unspecified shoulder: Secondary | ICD-10-CM | POA: Diagnosis present

## 2013-07-09 DIAGNOSIS — E119 Type 2 diabetes mellitus without complications: Secondary | ICD-10-CM

## 2013-07-09 HISTORY — DX: Obstructive sleep apnea (adult) (pediatric): G47.33

## 2013-07-09 HISTORY — DX: Chronic respiratory failure, unspecified whether with hypoxia or hypercapnia: J96.10

## 2013-07-09 HISTORY — DX: Pulmonary hypertension, unspecified: I27.20

## 2013-07-09 HISTORY — DX: Chronic kidney disease, stage 2 (mild): N18.2

## 2013-07-09 LAB — URINE MICROSCOPIC-ADD ON

## 2013-07-09 LAB — URINALYSIS, ROUTINE W REFLEX MICROSCOPIC
Bilirubin Urine: NEGATIVE
Glucose, UA: NEGATIVE mg/dL
Ketones, ur: NEGATIVE mg/dL
Leukocytes, UA: NEGATIVE
Specific Gravity, Urine: 1.01 (ref 1.005–1.030)
pH: 7.5 (ref 5.0–8.0)

## 2013-07-09 LAB — CBC WITH DIFFERENTIAL/PLATELET
Eosinophils Absolute: 0.1 10*3/uL (ref 0.0–0.7)
Eosinophils Relative: 1 % (ref 0–5)
HCT: 36.7 % (ref 36.0–46.0)
Hemoglobin: 11.1 g/dL — ABNORMAL LOW (ref 12.0–15.0)
Lymphocytes Relative: 14 % (ref 12–46)
Lymphs Abs: 1.2 10*3/uL (ref 0.7–4.0)
MCH: 24.4 pg — ABNORMAL LOW (ref 26.0–34.0)
MCV: 80.7 fL (ref 78.0–100.0)
Monocytes Absolute: 0.7 10*3/uL (ref 0.1–1.0)
Monocytes Relative: 8 % (ref 3–12)
Platelets: 207 10*3/uL (ref 150–400)
RBC: 4.55 MIL/uL (ref 3.87–5.11)

## 2013-07-09 LAB — BASIC METABOLIC PANEL
BUN: 15 mg/dL (ref 6–23)
Calcium: 11 mg/dL — ABNORMAL HIGH (ref 8.4–10.5)
Chloride: 101 mEq/L (ref 96–112)
Creatinine, Ser: 0.77 mg/dL (ref 0.50–1.10)
GFR calc non Af Amer: 76 mL/min — ABNORMAL LOW (ref >90)
Glucose, Bld: 109 mg/dL — ABNORMAL HIGH (ref 70–99)

## 2013-07-09 MED ORDER — IPRATROPIUM BROMIDE 0.02 % IN SOLN
0.5000 mg | Freq: Once | RESPIRATORY_TRACT | Status: AC
  Administered 2013-07-09: 0.5 mg via RESPIRATORY_TRACT
  Filled 2013-07-09: qty 2.5

## 2013-07-09 MED ORDER — ASPIRIN 81 MG PO CHEW
324.0000 mg | CHEWABLE_TABLET | Freq: Once | ORAL | Status: AC
  Administered 2013-07-09: 324 mg via ORAL
  Filled 2013-07-09: qty 4

## 2013-07-09 MED ORDER — SODIUM CHLORIDE 0.9 % IV SOLN: INTRAVENOUS | Status: DC

## 2013-07-09 MED ORDER — NITROGLYCERIN 0.4 MG SL SUBL
0.4000 mg | SUBLINGUAL_TABLET | SUBLINGUAL | Status: AC | PRN
  Administered 2013-07-09 (×3): 0.4 mg via SUBLINGUAL
  Filled 2013-07-09: qty 25

## 2013-07-09 MED ORDER — ALBUTEROL SULFATE (5 MG/ML) 0.5% IN NEBU
2.5000 mg | INHALATION_SOLUTION | Freq: Once | RESPIRATORY_TRACT | Status: AC
  Administered 2013-07-09: 2.5 mg via RESPIRATORY_TRACT
  Filled 2013-07-09: qty 0.5

## 2013-07-09 MED ORDER — FUROSEMIDE 10 MG/ML IJ SOLN
40.0000 mg | Freq: Once | INTRAMUSCULAR | Status: AC
  Administered 2013-07-09: 40 mg via INTRAVENOUS
  Filled 2013-07-09: qty 4

## 2013-07-09 NOTE — ED Notes (Signed)
Calmer, not grunting as much, states pain has eased some, but still gives a 9/10 for chest pain

## 2013-07-09 NOTE — ED Notes (Signed)
Patient complaining of centralized chest pain that started this morning accompanied by shortness of breath. Reports took 3 nitro pta with no relief.

## 2013-07-09 NOTE — ED Provider Notes (Signed)
CSN: [id number]     Arrival date & time 07/09/13  1927 History   First MD Initiated Contact with Patient 07/09/13 1932     Chief Complaint  Patient presents with  . Chest Pain  . Shortness of Breath    HPI Pt was seen at 1940. Per pt, c/o gradual onset and persistence of constant mid-sternal chest "pain" since this morning. Describes the CP as "heaviness." Has been associated with SOB, cough, and nausea. States she took her home SL ntg without relief. Family states pt has had increasing pedal edema for the past several days and pt's symptoms are "like when she gets in heart failure." Denies fevers, no abd pain, no vomiting/diarrhea, no back pain, no palpitations.    Past Medical History  Diagnosis Date  . Hypertension   . Nephrolithiasis   . GERD (gastroesophageal reflux disease)   . Arthritis   . Psoriasis   . Morbid obesity   . Mitral regurgitation   . Hyperlipidemia   . Sleep apnea     uses cpap  . CHF (congestive heart failure)     Diastolic. EF 55-65% by cath 10/11/11  . Diabetes mellitus     insulin dependent  . Acute renal insufficiency     09/2011. not on ACEI secondary to this (also has renal artery stenosis)  . Peripheral vascular disease     severe right renal artery stenosis and left SFA stenosis by PV angio 09/2011, treated medically  . Cellulitis   . CAD (coronary artery disease)     NSTEMI 09/2011 felt poor candidate for CABG, instead s/p PTCA/DES to mid RCA 10/17/11   Past Surgical History  Procedure Laterality Date  . Acromio-clavicular joint repair  2011  . Abdominal hysterectomy    . Eye surgery      cataract  OD    History  Substance Use Topics  . Smoking status: Never Smoker   . Smokeless tobacco: Never Used  . Alcohol Use: No    Review of Systems ROS: Statement: All systems negative except as marked or noted in the HPI; Constitutional: Negative for fever and chills. ; ; Eyes: Negative for eye pain, redness and discharge. ; ; ENMT: Negative for ear  pain, hoarseness, nasal congestion, sinus pressure and sore throat. ; ; Cardiovascular: +CP, SOB, peripheral edema. Negative for palpitations, diaphoresis. ; ; Respiratory: +cough. Negative for wheezing and stridor. ; ; Gastrointestinal: +nausea. Negative for vomiting, diarrhea, abdominal pain, blood in stool, hematemesis, jaundice and rectal bleeding. . ; ; Genitourinary: Negative for dysuria, flank pain and hematuria. ; ; Musculoskeletal: Negative for back pain and neck pain. Negative for swelling and trauma.; ; Skin: Negative for pruritus, rash, abrasions, blisters, bruising and skin lesion.; ; Neuro: Negative for headache, lightheadedness and neck stiffness. Negative for weakness, altered level of consciousness , altered mental status, extremity weakness, paresthesias, involuntary movement, seizure and syncope.       Allergies  Review of patient's allergies indicates no known allergies.  Home Medications   Current Outpatient Rx  Name  Route  Sig  Dispense  Refill  . amLODipine (NORVASC) 10 MG tablet   Oral   Take 10 mg by mouth daily.         Marland Kitchen aspirin 81 MG tablet   Oral   Take 81 mg by mouth daily.         Marland Kitchen atenolol (TENORMIN) 50 MG tablet   Oral   Take 75 mg by mouth daily.         Marland Kitchen  atorvastatin (LIPITOR) 20 MG tablet   Oral   Take 20 mg by mouth daily.         . Cholecalciferol (VITAMIN D-3) 1000 UNITS CAPS   Oral   Take 1,000 Units by mouth daily.         . clobetasol (OLUX) 0.05 % topical foam      Apply to scalp daily as needed         . cloNIDine (CATAPRES - DOSED IN MG/24 HR) 0.2 mg/24hr patch   Transdermal   Place 1 patch onto the skin once a week.         . clopidogrel (PLAVIX) 75 MG tablet   Oral   Take 75 mg by mouth daily.         Marland Kitchen docusate sodium (COLACE) 100 MG capsule   Oral   Take 100 mg by mouth 2 (two) times daily.          . fexofenadine (ALLEGRA) 180 MG tablet   Oral   Take 180 mg by mouth daily. During allergy  seasons.         . fluocinonide ointment (LIDEX) 0.05 %      Apply to affected areas of the body daily as needed. Not to face.         . folic acid (FOLVITE) 1 MG tablet   Oral   Take 1 mg by mouth daily.         . furosemide (LASIX) 40 MG tablet   Oral   Take 80 mg by mouth as needed.          . gabapentin (NEURONTIN) 300 MG capsule   Oral   Take 300 mg by mouth 4 (four) times daily.          . hydrALAZINE (APRESOLINE) 25 MG tablet   Oral   Take 1 tablet (25 mg total) by mouth 3 (three) times daily.   90 tablet   6     New 05/02/2013   . insulin aspart (NOVOLOG) 100 UNIT/ML injection      SLIDING SCALE 0-9 Units, Injected subcutaneous, 3 times daily with meals. CBG < 70: implement hypoglycemia protocol CBG 70 - 120: 0 units CBG 121 - 150: 1 unit CBG 151 - 200: 2 units CBG 201 - 250: 3 units CBG 251 - 300: 5 units CBG 301 - 350: 7 units CBG 351 - 400: 9 units CBG > 400: call MD         . insulin glargine (LANTUS) 100 UNIT/ML injection   Subcutaneous   Inject 53-58 Units into the skin 2 (two) times daily.          . isosorbide mononitrate (IMDUR) 30 MG 24 hr tablet   Oral   Take 30 mg by mouth daily.         . isosorbide mononitrate (IMDUR) 60 MG 24 hr tablet   Oral   Take 1 tablet (60 mg total) by mouth daily. In the morning   30 tablet   6     *direction change   . methotrexate (RHEUMATREX) 2.5 MG tablet   Oral   Take 7.5 mg by mouth once a week. Caution:Chemotherapy. Protect from light. Takes on Wednesday.         . mometasone (NASONEX) 50 MCG/ACT nasal spray               . montelukast (SINGULAIR) 10 MG tablet   Oral   Take 10 mg by mouth  daily.         Marland Kitchen EXPIRED: nitroGLYCERIN (NITROSTAT) 0.4 MG SL tablet   Sublingual   Place 1 tablet (0.4 mg total) under the tongue every 5 (five) minutes x 3 doses as needed for chest pain.   25 tablet   4   . pantoprazole (PROTONIX) 40 MG tablet   Oral   Take 40 mg by mouth daily.           . potassium chloride SA (K-DUR,KLOR-CON) 20 MEQ tablet   Oral   Take 20 mEq by mouth daily.          BP 159/89  Pulse 65  Resp 23  Ht 5\' 4"  (1.626 m)  Wt 229 lb (103.874 kg)  BMI 39.29 kg/m2  SpO2 100%  LMP 08/29/1990 Physical Exam 1945: Physical examination:  Nursing notes reviewed; Vital signs and O2 SAT reviewed;  Constitutional: Well developed, Well nourished, Well hydrated, In no acute distress; Head:  Normocephalic, atraumatic; Eyes: EOMI, PERRL, No scleral icterus; ENMT: Mouth and pharynx normal, Mucous membranes moist; Neck: Supple, Full range of motion, No lymphadenopathy; Cardiovascular: Regular rate and rhythm, No gallop; Respiratory: Breath sounds coarse & equal bilaterally, faint scattered wheezes.  Speaking full sentences, Normal respiratory effort/excursion; Chest: Nontender, Movement normal; Abdomen: Soft, Nontender, Nondistended, Normal bowel sounds; Genitourinary: No CVA tenderness; Extremities: Pulses normal, No tenderness, +2 pedal edema bilat without calf asymmetry.; Neuro: AA&Ox3, Major CN grossly intact. Speech clear. No gross focal motor or sensory deficits in extremities.; Skin: Color normal, Warm, Dry.   ED Course  Procedures   EKG Interpretation     Ventricular Rate:  67 PR Interval:  160 QRS Duration: 84 QT Interval:  424 QTC Calculation: 448 R Axis:   -6 Text Interpretation:  Sinus rhythm with occasional Premature ventricular complexes Artifact Minimal voltage criteria for LVH, may be normal variant T wave abnormality, consider lateral ischemia When compared with ECG of 18-Oct-2011 06:00, Premature ventricular complexes are now Present Otherwise no significant change            MDM  MDM Reviewed: previous chart, nursing note and vitals Reviewed previous: labs and ECG Interpretation: labs, ECG and x-ray   Results for orders placed during the hospital encounter of 07/09/13  CBC WITH DIFFERENTIAL      Result Value Range   WBC 8.9   4.0 - 10.5 K/uL   RBC 4.55  3.87 - 5.11 MIL/uL   Hemoglobin 11.1 (*) 12.0 - 15.0 g/dL   HCT 47.8  29.5 - 62.1 %   MCV 80.7  78.0 - 100.0 fL   MCH 24.4 (*) 26.0 - 34.0 pg   MCHC 30.2  30.0 - 36.0 g/dL   RDW 30.8 (*) 65.7 - 84.6 %   Platelets 207  150 - 400 K/uL   Neutrophils Relative % 77  43 - 77 %   Neutro Abs 6.8  1.7 - 7.7 K/uL   Lymphocytes Relative 14  12 - 46 %   Lymphs Abs 1.2  0.7 - 4.0 K/uL   Monocytes Relative 8  3 - 12 %   Monocytes Absolute 0.7  0.1 - 1.0 K/uL   Eosinophils Relative 1  0 - 5 %   Eosinophils Absolute 0.1  0.0 - 0.7 K/uL   Basophils Relative 0  0 - 1 %   Basophils Absolute 0.0  0.0 - 0.1 K/uL  BASIC METABOLIC PANEL      Result Value Range   Sodium 138  135 -  145 mEq/L   Potassium 3.8  3.5 - 5.1 mEq/L   Chloride 101  96 - 112 mEq/L   CO2 28  19 - 32 mEq/L   Glucose, Bld 109 (*) 70 - 99 mg/dL   BUN 15  6 - 23 mg/dL   Creatinine, Ser 1.61  0.50 - 1.10 mg/dL   Calcium 09.6 (*) 8.4 - 10.5 mg/dL   GFR calc non Af Amer 76 (*) >90 mL/min   GFR calc Af Amer 88 (*) >90 mL/min  TROPONIN I      Result Value Range   Troponin I <0.30  <0.30 ng/mL  PRO B NATRIURETIC PEPTIDE      Result Value Range   Pro B Natriuretic peptide (BNP) 390.6  0 - 450 pg/mL   Dg Chest Portable 1 View 07/09/2013   CLINICAL DATA:  Chest pain, shortness of breath  EXAM: PORTABLE CHEST - 1 VIEW  COMPARISON:  04/26/2013  FINDINGS: Cardiomegaly with pulmonary vascular congestion and suspected mild interstitial edema.  Bibasilar opacities, likely atelectasis. Possible small right pleural effusion. No pneumothorax.  IMPRESSION: Cardiomegaly with suspected mild interstitial edema.  Possible small right pleural effusion.   Electronically Signed   By: Charline Bills M.D.   On: 07/09/2013 20:19    2200:  Pt states she feels better after neb, SL ntg, ASA and lasix.  Dx and testing d/w pt and family.  Questions answered.  Verb understanding, agreeable to admit. T/C to [facility] Dr. Rito Ehrlich, case  discussed, including:  HPI, pertinent PM/SHx, VS/PE, dx testing, ED course and treatment:  Agreeable to admit, requests to write temporary orders, obtain tele inpt bed to team 1.      Laray Anger, DO 07/10/13 1941

## 2013-07-09 NOTE — H&P (Signed)
[facility] History and Physical  Theresa Mclean LOV:[id number] DOB: 1929/01/27 DOA: 07/09/2013   PCP: Ignatius Specking., MD  Specialists: Followed by [facility] cardiology  Chief Complaint: Shortness of breath, and chest pain since Tuesday morning  HPI: Theresa Mclean is a 79 y.o. female with a past medical history of coronary artery disease, diastolic heart failure, diabetes, hypertension, who was in her usual state of health till Tuesday morning, when she woke up and started having pressure-like sensation across her chest. It was 9/10 in intensity. She had a headache. She felt some abdominal cramps. She has some nausea, but denies any vomiting. She had shortness of breath, which was worse with lying down. She noticed the that her legs were swollen over the last many days and they have been getting worse. Has been feeling cold, but denies any fever. Denies any palpitations. Denies any recent changes to her medications. Denies any dietary indiscretions. Because the symptoms were getting worse she decided to come in to the hospital. She has received nitroglycerin and her pain is improved, although still persists.  Home Medications: Prior to Admission medications   Medication Sig Start Date End Date Taking? Authorizing Provider  amLODipine (NORVASC) 10 MG tablet Take 10 mg by mouth daily.   Yes Historical Provider, MD  aspirin 81 MG tablet Take 81 mg by mouth daily.   Yes Historical Provider, MD  atenolol (TENORMIN) 50 MG tablet Take 75 mg by mouth daily. 10/28/11  Yes Clide Deutscher Serpe, PA-C  atorvastatin (LIPITOR) 20 MG tablet Take 20 mg by mouth at bedtime.    Yes Historical Provider, MD  Cholecalciferol (VITAMIN D-3) 1000 UNITS CAPS Take 1,000 Units by mouth daily.   Yes Historical Provider, MD  clobetasol (OLUX) 0.05 % topical foam Apply to scalp daily as needed 04/24/13  Yes Historical Provider, MD  cloNIDine (CATAPRES - DOSED IN MG/24 HR) 0.2 mg/24hr patch Place 1 patch onto the skin once a week.    Yes Historical Provider, MD  clopidogrel (PLAVIX) 75 MG tablet Take 75 mg by mouth daily.   Yes Historical Provider, MD  docusate sodium (COLACE) 100 MG capsule Take 100 mg by mouth 2 (two) times daily.    Yes Historical Provider, MD  fexofenadine (ALLEGRA) 180 MG tablet Take 180 mg by mouth daily. During allergy seasons.   Yes Historical Provider, MD  fluocinonide ointment (LIDEX) 0.05 % Apply to affected areas of the body daily as needed. Not to face. 04/24/13 04/24/14 Yes Historical Provider, MD  folic acid (FOLVITE) 1 MG tablet Take 1 mg by mouth daily.   Yes Historical Provider, MD  furosemide (LASIX) 40 MG tablet Take 80 mg by mouth daily as needed for fluid.    Yes Historical Provider, MD  gabapentin (NEURONTIN) 300 MG capsule Take 300 mg by mouth 4 (four) times daily.    Yes Historical Provider, MD  hydrALAZINE (APRESOLINE) 25 MG tablet Take 25 mg by mouth 3 (three) times daily.   Yes Historical Provider, MD  insulin aspart (NOVOLOG) 100 UNIT/ML injection SLIDING SCALE 0-9 Units, Injected subcutaneous, 3 times daily with meals. CBG < 70: implement hypoglycemia protocol CBG 70 - 120: 0 units CBG 121 - 150: 1 unit CBG 151 - 200: 2 units CBG 201 - 250: 3 units CBG 251 - 300: 5 units CBG 301 - 350: 7 units CBG 351 - 400: 9 units CBG > 400: call MD 10/20/11  Yes Dayna N Dunn, PA-C  insulin glargine (LANTUS) 100 UNIT/ML injection Inject  53-58 Units into the skin 2 (two) times daily.    Yes Historical Provider, MD  isosorbide mononitrate (IMDUR) 60 MG 24 hr tablet Take 60 mg by mouth daily.   Yes Historical Provider, MD  methotrexate (RHEUMATREX) 2.5 MG tablet Take 7.5 mg by mouth every Wednesday. Caution:Chemotherapy. Protect from light. Takes on Wednesday.   Yes Historical Provider, MD  montelukast (SINGULAIR) 10 MG tablet Take 10 mg by mouth daily.   Yes Historical Provider, MD  nitroGLYCERIN (NITROSTAT) 0.4 MG SL tablet Place 0.4 mg under the tongue every 5 (five) minutes as needed for chest  pain.   Yes Historical Provider, MD  pantoprazole (PROTONIX) 40 MG tablet Take 40 mg by mouth 2 (two) times daily.    Yes Historical Provider, MD  potassium chloride SA (K-DUR,KLOR-CON) 20 MEQ tablet Take 20 mEq by mouth daily.   Yes Historical Provider, MD    Allergies: No Known Allergies  Past Medical History: Past Medical History  Diagnosis Date  . Hypertension   . Nephrolithiasis   . GERD (gastroesophageal reflux disease)   . Arthritis   . Psoriasis   . Morbid obesity   . Mitral regurgitation   . Hyperlipidemia   . Sleep apnea     uses cpap  . CHF (congestive heart failure)     Diastolic. EF 55-65% by cath 10/11/11  . Diabetes mellitus     insulin dependent  . Acute renal insufficiency     09/2011. not on ACEI secondary to this (also has renal artery stenosis)  . Peripheral vascular disease     severe right renal artery stenosis and left SFA stenosis by PV angio 09/2011, treated medically  . Cellulitis   . CAD (coronary artery disease)     NSTEMI 09/2011 felt poor candidate for CABG, instead s/p PTCA/DES to mid RCA 10/17/11    Past Surgical History  Procedure Laterality Date  . Acromio-clavicular joint repair  2011  . Abdominal hysterectomy    . Eye surgery      cataract  OD    Social History: She lives with her son. She denies smoking, alcohol or illicit drug use. She uses a walker to ambulate.  Family History: There is history of diabetes in the family.  Review of Systems - History obtained from the patient and son and daughter General ROS: positive for  - fatigue Psychological ROS: negative Ophthalmic ROS: negative ENT ROS: negative Allergy and Immunology ROS: negative Hematological and Lymphatic ROS: negative Endocrine ROS: negative Respiratory ROS: as in hpi Cardiovascular ROS: as in hpi Gastrointestinal ROS: as in hpi Genito-Urinary ROS: no dysuria, trouble voiding, or hematuria Musculoskeletal ROS: negative Neurological ROS: no TIA or stroke  symptoms Dermatological ROS: negative  Physical Examination  Filed Vitals:   07/09/13 2130 07/09/13 2230 07/09/13 2300 07/09/13 2345  BP: 191/70 182/54 168/63   Pulse:  67 63   Temp:    98.4 F (36.9 C)  TempSrc:    Oral  Resp: 34 23    Height:    5\' 4"  (1.626 m)  Weight:    103.8 kg (228 lb 13.4 oz)  SpO2:        General appearance: alert, cooperative, appears stated age, no distress and morbidly obese Head: Normocephalic, without obvious abnormality, atraumatic Eyes: conjunctivae/corneas clear. PERRL, EOM's intact.  Throat: lips, mucosa, and tongue normal; teeth and gums normal Neck: no adenopathy, no carotid bruit, no JVD, supple, symmetrical, trachea midline and thyroid not enlarged, symmetric, no tenderness/mass/nodules Resp: crackles bilaterally  halfway up the lung fields. No wheezing Chest wall: There was tenderness to palpation, which reproduced the pain to some extent Cardio: regular rate and rhythm, S1, S2 normal, no murmur, click, rub or gallop GI: soft, non-tender; bowel sounds normal; no masses,  no organomegaly Extremities: edema 2-3+ pitting edema bilateral lower extremities. Pulses: 2+ and symmetric Skin: Skin color, texture, turgor normal. No rashes or lesions Neurologic: She is alert and oriented x3. No focal neurological deficits are present. Somewhat slow to respond at times.  Laboratory Data: Results for orders placed during the hospital encounter of 07/09/13 (from the past 48 hour(s))  CBC WITH DIFFERENTIAL     Status: Abnormal   Collection Time    07/09/13  7:37 PM      Result Value Range   WBC 8.9  4.0 - 10.5 K/uL   RBC 4.55  3.87 - 5.11 MIL/uL   Hemoglobin 11.1 (*) 12.0 - 15.0 g/dL   HCT 16.1  09.6 - 04.5 %   MCV 80.7  78.0 - 100.0 fL   MCH 24.4 (*) 26.0 - 34.0 pg   MCHC 30.2  30.0 - 36.0 g/dL   RDW 40.9 (*) 81.1 - 91.4 %   Platelets 207  150 - 400 K/uL   Neutrophils Relative % 77  43 - 77 %   Neutro Abs 6.8  1.7 - 7.7 K/uL   Lymphocytes  Relative 14  12 - 46 %   Lymphs Abs 1.2  0.7 - 4.0 K/uL   Monocytes Relative 8  3 - 12 %   Monocytes Absolute 0.7  0.1 - 1.0 K/uL   Eosinophils Relative 1  0 - 5 %   Eosinophils Absolute 0.1  0.0 - 0.7 K/uL   Basophils Relative 0  0 - 1 %   Basophils Absolute 0.0  0.0 - 0.1 K/uL  BASIC METABOLIC PANEL     Status: Abnormal   Collection Time    07/09/13  7:37 PM      Result Value Range   Sodium 138  135 - 145 mEq/L   Potassium 3.8  3.5 - 5.1 mEq/L   Chloride 101  96 - 112 mEq/L   CO2 28  19 - 32 mEq/L   Glucose, Bld 109 (*) 70 - 99 mg/dL   BUN 15  6 - 23 mg/dL   Creatinine, Ser 7.82  0.50 - 1.10 mg/dL   Calcium 95.6 (*) 8.4 - 10.5 mg/dL   GFR calc non Af Amer 76 (*) >90 mL/min   GFR calc Af Amer 88 (*) >90 mL/min   Comment: (NOTE)     The eGFR has been calculated using the CKD EPI equation.     This calculation has not been validated in all clinical situations.     eGFR's persistently <90 mL/min signify possible Chronic Kidney     Disease.  TROPONIN I     Status: None   Collection Time    07/09/13  7:37 PM      Result Value Range   Troponin I <0.30  <0.30 ng/mL   Comment:            Due to the release kinetics of cTnI,     a negative result within the first hours     of the onset of symptoms does not rule out     myocardial infarction with certainty.     If myocardial infarction is still suspected,     repeat the test at appropriate intervals.  PRO B NATRIURETIC PEPTIDE     Status: None   Collection Time    07/09/13  7:37 PM      Result Value Range   Pro B Natriuretic peptide (BNP) 390.6  0 - 450 pg/mL  URINALYSIS, ROUTINE W REFLEX MICROSCOPIC     Status: Abnormal   Collection Time    07/09/13  9:00 PM      Result Value Range   Color, Urine YELLOW  YELLOW   APPearance CLOUDY (*) CLEAR   Specific Gravity, Urine 1.010  1.005 - 1.030   pH 7.5  5.0 - 8.0   Glucose, UA NEGATIVE  NEGATIVE mg/dL   Hgb urine dipstick TRACE (*) NEGATIVE   Bilirubin Urine NEGATIVE  NEGATIVE    Ketones, ur NEGATIVE  NEGATIVE mg/dL   Protein, ur TRACE (*) NEGATIVE mg/dL   Urobilinogen, UA 0.2  0.0 - 1.0 mg/dL   Nitrite NEGATIVE  NEGATIVE   Leukocytes, UA NEGATIVE  NEGATIVE  URINE MICROSCOPIC-ADD ON     Status: Abnormal   Collection Time    07/09/13  9:00 PM      Result Value Range   RBC / HPF 0-2  <3 RBC/hpf   Bacteria, UA FEW (*) RARE  GLUCOSE, CAPILLARY     Status: None   Collection Time    07/10/13 12:24 AM      Result Value Range   Glucose-Capillary 83  70 - 99 mg/dL   Comment 1 Notify RN      Radiology Reports: Dg Chest Portable 1 View  07/09/2013   CLINICAL DATA:  Chest pain, shortness of breath  EXAM: PORTABLE CHEST - 1 VIEW  COMPARISON:  04/26/2013  FINDINGS: Cardiomegaly with pulmonary vascular congestion and suspected mild interstitial edema.  Bibasilar opacities, likely atelectasis. Possible small right pleural effusion. No pneumothorax.  IMPRESSION: Cardiomegaly with suspected mild interstitial edema.  Possible small right pleural effusion.   Electronically Signed   By: Charline Bills M.D.   On: 07/09/2013 20:19    Electrocardiogram: Sinus rhythm at 67 beats per minute. Normal axis. PVCs are noted. No definite Q waves. T inversion is noted in 1 and aVL, along with V6. No concerning ST changes. These changes are seen on previous EKGs as well  Problem List  Principal Problem:   Acute on chronic diastolic heart failure Active Problems:   CAD (coronary artery disease)   CKD (chronic kidney disease)   Obstructive sleep apnea   Assessment: This is 79 year old, African American female, who presents with chest pressure and has shortness of breath with pedal edema. This is most likely acute diastolic CHF even though her BNP is not that elevated. Chest pain is most likely due to CHF. She is on oxygen at home. She had also has sleep apnea, but has not been very compliant with her CPAP.  Plan: #1 acute diastolic CHF: She'll be admitted to the hospital. She'll be  placed on intravenous Lasix. She had an echocardiogram recently so one will not be repeated. This echocardiogram done in October showed normal systolic function with grade 2 diastolic dysfunction. She'll be continued on beta blocker and hydralazine. We will apply nitroglycerin ointment for now due to her chest pressure and hold off on her Imdur. Electrolytes will need to be monitored closely. Continue oxygen  #2 chest pain: Most likely due to CHF. Continue nitroglycerin ointment. Serial Troponins. EKG is nonischemic.  #3 diabetes mellitus, type II: Resume her Lantus. Place on sliding scale coverage. Check HbA1c.  #4  history of obstructive sleep apnea: CPAP will be utilized.  #5 history of coronary artery disease. She had a non-ST elevation MI in 2013 and was treated with PTCA and drug-eluting stent to the mid RCA. This appears to be stable. Continue with antiplatelet agents and beta blocker.  #6 history of hypertension: Continue with her antihypertensive regimen. It is noted that she is on a clonidine patch. This will be continued to avoid rebound hypertension.   DVT Prophylaxis: Heparin Code Status: Full code Family Communication: Discussed with the son, daughter, and the patient  Disposition Plan: Admit to telemetry   Further management decisions will depend on results of further testing and patient's response to treatment.  [facility]  [facility] Pager [phone]  If 7PM-7AM, please contact night-coverage [email or web address] Password [facility]  07/10/2013, 12:28 AM

## 2013-07-10 ENCOUNTER — Encounter (HOSPITAL_COMMUNITY): Payer: Self-pay | Admitting: *Deleted

## 2013-07-10 DIAGNOSIS — G4733 Obstructive sleep apnea (adult) (pediatric): Secondary | ICD-10-CM

## 2013-07-10 DIAGNOSIS — I5033 Acute on chronic diastolic (congestive) heart failure: Principal | ICD-10-CM | POA: Diagnosis present

## 2013-07-10 DIAGNOSIS — IMO0002 Reserved for concepts with insufficient information to code with codable children: Secondary | ICD-10-CM | POA: Diagnosis present

## 2013-07-10 DIAGNOSIS — J9610 Chronic respiratory failure, unspecified whether with hypoxia or hypercapnia: Secondary | ICD-10-CM

## 2013-07-10 DIAGNOSIS — E1129 Type 2 diabetes mellitus with other diabetic kidney complication: Secondary | ICD-10-CM | POA: Diagnosis present

## 2013-07-10 DIAGNOSIS — I2789 Other specified pulmonary heart diseases: Secondary | ICD-10-CM

## 2013-07-10 DIAGNOSIS — I272 Pulmonary hypertension, unspecified: Secondary | ICD-10-CM | POA: Diagnosis present

## 2013-07-10 DIAGNOSIS — D649 Anemia, unspecified: Secondary | ICD-10-CM | POA: Diagnosis present

## 2013-07-10 LAB — GLUCOSE, CAPILLARY
Glucose-Capillary: 134 mg/dL — ABNORMAL HIGH (ref 70–99)
Glucose-Capillary: 165 mg/dL — ABNORMAL HIGH (ref 70–99)
Glucose-Capillary: 83 mg/dL (ref 70–99)

## 2013-07-10 LAB — CBC
HCT: 35.5 % — ABNORMAL LOW (ref 36.0–46.0)
MCH: 24 pg — ABNORMAL LOW (ref 26.0–34.0)
MCV: 81.2 fL (ref 78.0–100.0)
Platelets: ADEQUATE 10*3/uL (ref 150–400)
RBC: 4.37 MIL/uL (ref 3.87–5.11)
RDW: 19.9 % — ABNORMAL HIGH (ref 11.5–15.5)
WBC: 7.5 10*3/uL (ref 4.0–10.5)

## 2013-07-10 LAB — TROPONIN I
Troponin I: <0.3 ng/mL (ref <0.30)
Troponin I: <0.3 ng/mL (ref <0.30)

## 2013-07-10 LAB — HEMOGLOBIN A1C: Hgb A1c MFr Bld: 7.4 % — ABNORMAL HIGH (ref <5.7)

## 2013-07-10 LAB — TSH: TSH: 5.489 u[IU]/mL — ABNORMAL HIGH (ref 0.350–4.500)

## 2013-07-10 MED ORDER — ATORVASTATIN CALCIUM 20 MG PO TABS
20.0000 mg | ORAL_TABLET | Freq: Every day | ORAL | Status: DC
  Administered 2013-07-10 – 2013-07-11 (×2): 20 mg via ORAL
  Filled 2013-07-10 (×2): qty 1

## 2013-07-10 MED ORDER — SODIUM CHLORIDE 0.9 % IJ SOLN: 3.0000 mL | INTRAMUSCULAR | Status: DC | PRN

## 2013-07-10 MED ORDER — PNEUMOCOCCAL VAC POLYVALENT 25 MCG/0.5ML IJ INJ
0.5000 mL | INJECTION | INTRAMUSCULAR | Status: DC
  Filled 2013-07-10: qty 0.5

## 2013-07-10 MED ORDER — CLOPIDOGREL BISULFATE 75 MG PO TABS
75.0000 mg | ORAL_TABLET | Freq: Every day | ORAL | Status: DC
  Administered 2013-07-10 – 2013-07-12 (×3): 75 mg via ORAL
  Filled 2013-07-10 (×3): qty 1

## 2013-07-10 MED ORDER — AMLODIPINE BESYLATE 5 MG PO TABS
10.0000 mg | ORAL_TABLET | Freq: Every day | ORAL | Status: DC
  Administered 2013-07-11 – 2013-07-12 (×2): 10 mg via ORAL
  Filled 2013-07-10 (×3): qty 2

## 2013-07-10 MED ORDER — HEPARIN SODIUM (PORCINE) 5000 UNIT/ML IJ SOLN
5000.0000 [IU] | Freq: Three times a day (TID) | INTRAMUSCULAR | Status: DC
  Administered 2013-07-10 – 2013-07-12 (×8): 5000 [IU] via SUBCUTANEOUS
  Filled 2013-07-10 (×8): qty 1

## 2013-07-10 MED ORDER — MUPIROCIN 2 % EX OINT
1.0000 "application " | TOPICAL_OINTMENT | Freq: Two times a day (BID) | CUTANEOUS | Status: DC
  Administered 2013-07-10 – 2013-07-12 (×5): 1 via NASAL
  Filled 2013-07-10: qty 22

## 2013-07-10 MED ORDER — DEXTROSE 50 % IV SOLN
INTRAVENOUS | Status: AC
  Administered 2013-07-10: 25 mL
  Filled 2013-07-10: qty 50

## 2013-07-10 MED ORDER — HYDRALAZINE HCL 25 MG PO TABS
25.0000 mg | ORAL_TABLET | Freq: Three times a day (TID) | ORAL | Status: DC
  Administered 2013-07-10 – 2013-07-12 (×5): 25 mg via ORAL
  Filled 2013-07-10 (×7): qty 1

## 2013-07-10 MED ORDER — PANTOPRAZOLE SODIUM 40 MG PO TBEC
40.0000 mg | DELAYED_RELEASE_TABLET | Freq: Two times a day (BID) | ORAL | Status: DC
  Administered 2013-07-10 – 2013-07-12 (×6): 40 mg via ORAL
  Filled 2013-07-10 (×6): qty 1

## 2013-07-10 MED ORDER — DOCUSATE SODIUM 100 MG PO CAPS
100.0000 mg | ORAL_CAPSULE | Freq: Two times a day (BID) | ORAL | Status: DC
  Administered 2013-07-10 – 2013-07-11 (×3): 100 mg via ORAL
  Filled 2013-07-10 (×3): qty 1

## 2013-07-10 MED ORDER — NITROGLYCERIN 2 % TD OINT
1.0000 [in_us] | TOPICAL_OINTMENT | Freq: Four times a day (QID) | TRANSDERMAL | Status: DC
  Administered 2013-07-10 (×2): 1 [in_us] via TOPICAL
  Filled 2013-07-10 (×2): qty 1

## 2013-07-10 MED ORDER — INSULIN GLARGINE 100 UNIT/ML ~~LOC~~ SOLN
45.0000 [IU] | Freq: Two times a day (BID) | SUBCUTANEOUS | Status: DC
  Filled 2013-07-10: qty 0.45

## 2013-07-10 MED ORDER — REGADENOSON 0.4 MG/5ML IV SOLN
0.4000 mg | Freq: Once | INTRAVENOUS | Status: AC
  Administered 2013-07-11: 0.4 mg via INTRAVENOUS
  Filled 2013-07-10: qty 5

## 2013-07-10 MED ORDER — FUROSEMIDE 10 MG/ML IJ SOLN
60.0000 mg | Freq: Two times a day (BID) | INTRAMUSCULAR | Status: DC
  Administered 2013-07-10 – 2013-07-11 (×3): 60 mg via INTRAVENOUS
  Filled 2013-07-10 (×3): qty 6

## 2013-07-10 MED ORDER — SODIUM CHLORIDE 0.9 % IJ SOLN
3.0000 mL | Freq: Two times a day (BID) | INTRAMUSCULAR | Status: DC
  Administered 2013-07-10 – 2013-07-11 (×4): 3 mL via INTRAVENOUS

## 2013-07-10 MED ORDER — BIOTENE DRY MOUTH MT LIQD
15.0000 mL | Freq: Two times a day (BID) | OROMUCOSAL | Status: DC
  Administered 2013-07-11 – 2013-07-12 (×2): 15 mL via OROMUCOSAL

## 2013-07-10 MED ORDER — ASPIRIN EC 81 MG PO TBEC
81.0000 mg | DELAYED_RELEASE_TABLET | Freq: Every day | ORAL | Status: DC
  Administered 2013-07-10 – 2013-07-12 (×3): 81 mg via ORAL
  Filled 2013-07-10 (×4): qty 1

## 2013-07-10 MED ORDER — SODIUM CHLORIDE 0.9 % IV SOLN: 250.0000 mL | INTRAVENOUS | Status: DC | PRN

## 2013-07-10 MED ORDER — INSULIN ASPART 100 UNIT/ML ~~LOC~~ SOLN
0.0000 [IU] | Freq: Every day | SUBCUTANEOUS | Status: DC
  Administered 2013-07-11: 2 [IU] via SUBCUTANEOUS

## 2013-07-10 MED ORDER — CHLORHEXIDINE GLUCONATE 0.12 % MT SOLN
15.0000 mL | Freq: Two times a day (BID) | OROMUCOSAL | Status: DC
  Administered 2013-07-10 – 2013-07-12 (×4): 15 mL via OROMUCOSAL
  Filled 2013-07-10 (×4): qty 15

## 2013-07-10 MED ORDER — CHLORHEXIDINE GLUCONATE CLOTH 2 % EX PADS
6.0000 | MEDICATED_PAD | Freq: Every day | CUTANEOUS | Status: DC
  Administered 2013-07-11 – 2013-07-12 (×2): 6 via TOPICAL

## 2013-07-10 MED ORDER — ATENOLOL 25 MG PO TABS
75.0000 mg | ORAL_TABLET | Freq: Every day | ORAL | Status: DC
  Administered 2013-07-11 – 2013-07-12 (×2): 75 mg via ORAL
  Filled 2013-07-10 (×3): qty 3

## 2013-07-10 MED ORDER — METHOTREXATE 2.5 MG PO TABS
7.5000 mg | ORAL_TABLET | ORAL | Status: DC
  Administered 2013-07-10: 7.5 mg via ORAL
  Filled 2013-07-10: qty 3

## 2013-07-10 MED ORDER — POTASSIUM CHLORIDE CRYS ER 20 MEQ PO TBCR
40.0000 meq | EXTENDED_RELEASE_TABLET | Freq: Two times a day (BID) | ORAL | Status: AC
  Administered 2013-07-10 (×2): 40 meq via ORAL
  Filled 2013-07-10 (×2): qty 2

## 2013-07-10 MED ORDER — GABAPENTIN 300 MG PO CAPS
300.0000 mg | ORAL_CAPSULE | Freq: Three times a day (TID) | ORAL | Status: DC
  Administered 2013-07-10 – 2013-07-12 (×7): 300 mg via ORAL
  Filled 2013-07-10 (×7): qty 1

## 2013-07-10 MED ORDER — INSULIN ASPART 100 UNIT/ML ~~LOC~~ SOLN
0.0000 [IU] | Freq: Three times a day (TID) | SUBCUTANEOUS | Status: DC
  Administered 2013-07-10: 3 [IU] via SUBCUTANEOUS
  Administered 2013-07-10: 2 [IU] via SUBCUTANEOUS
  Administered 2013-07-11 – 2013-07-12 (×4): 3 [IU] via SUBCUTANEOUS
  Administered 2013-07-12: 2 [IU] via SUBCUTANEOUS

## 2013-07-10 MED ORDER — CLONIDINE HCL 0.2 MG/24HR TD PTWK: 0.2000 mg | MEDICATED_PATCH | TRANSDERMAL | Status: DC

## 2013-07-10 MED ORDER — ACETAMINOPHEN 325 MG PO TABS
650.0000 mg | ORAL_TABLET | ORAL | Status: DC | PRN
  Administered 2013-07-11: 650 mg via ORAL
  Filled 2013-07-10: qty 2

## 2013-07-10 MED ORDER — ONDANSETRON HCL 4 MG/2ML IJ SOLN: 4.0000 mg | Freq: Four times a day (QID) | INTRAMUSCULAR | Status: DC | PRN

## 2013-07-10 MED ORDER — FOLIC ACID 1 MG PO TABS
1.0000 mg | ORAL_TABLET | Freq: Every day | ORAL | Status: DC
  Administered 2013-07-10 – 2013-07-12 (×3): 1 mg via ORAL
  Filled 2013-07-10 (×3): qty 1

## 2013-07-10 NOTE — Consult Note (Signed)
CARDIOLOGY CONSULT NOTE   Patient ID: Theresa Mclean MRN: [id number] DOB/AGE: Dec 24, 1928 79 y.o.  Admit Date: 07/09/2013 Referring Physician: PTH Primary Physician: Ignatius Specking., MD Consulting Cardiologist: Dina Rich MD Primary Cardiologist: Prentice Docker MD Reason for Consultation: CHF  Clinical Summary Theresa Mclean is a 79 y.o.female admitted with shortness of breath and diastolic CHF with known history of non-ST elevation MI in February 2013, CAD with PTCA, DES to the mid right coronary artery at that time, EF was 55-65% per cath; with other history to include poorly controlled hypertension, severe right renal artery stenosis and left SFA stenosis by PV angio February 2013 treated medically diabetes, and obesity. The patient began symptoms one day prior to admission, when she awakened and started having pressure-like pain in her chest which she described as 9/10 with associated abdominal fullness. She noted that her legs were swollen over the last several days. He is having symptoms of PND . The patient was also complaining of abdominal cramps and nausea but no vomiting. She does not weigh herself daily. She states she does not eat any salty foods, as her son does all the cooking. She denies medical noncompliance. She stated that she had some constipation, and took a stool softener with good results but continued to have abdominal fullness and early satiety.    EMS was called, and she was placed on oxygen. On arrival to the emergency room she was on 3 L with 100% saturation. Blood pressure was 150/97 with a heart rate of 62. Glucose is 109, creatinine 0.77 sodium 138 potassium 3.8. Nation's initial troponin was negative at less than 0.30, pro BNP 390.6. Chest x-ray revealed cardiomegaly with suspected mild interstitial edema, and possible right pleural effusion. EKG revealed normal sinus rhythm with frequent PVCs and nonspecific T-wave abnormalities in the lateral leads.    She was  treated with nebulizer treatments using albuterol and Atrovent, given aspirin 324 mg, sublingual nitroglycerin, and IV Lasix 40 mg x1. She has had documented diuresis of 1410 cc. He states she is breathing some better. Chest pressure has been relieved.     No Known Allergies  Medications Scheduled Medications: . amLODipine  10 mg Oral Daily  . aspirin EC  81 mg Oral Daily  . atenolol  75 mg Oral Daily  . atorvastatin  20 mg Oral QHS  . [START ON 07/15/2013] cloNIDine  0.2 mg Transdermal Weekly  . clopidogrel  75 mg Oral QAC breakfast  . docusate sodium  100 mg Oral BID  . folic acid  1 mg Oral Daily  . furosemide  60 mg Intravenous Q12H  . gabapentin  300 mg Oral TID  . heparin  5,000 Units Subcutaneous Q8H  . hydrALAZINE  25 mg Oral TID  . insulin aspart  0-15 Units Subcutaneous TID WC  . insulin aspart  0-5 Units Subcutaneous QHS  . insulin glargine  45 Units Subcutaneous BID  . methotrexate  7.5 mg Oral Q Wed  . nitroGLYCERIN  1 inch Topical Q6H  . pantoprazole  40 mg Oral BID  . [START ON 07/11/2013] pneumococcal 23 valent vaccine  0.5 mL Intramuscular Tomorrow-1000  . sodium chloride  3 mL Intravenous Q12H     Infusions:     PRN Medications:  sodium chloride, acetaminophen, ondansetron (ZOFRAN) IV, sodium chloride   Past Medical History  Diagnosis Date  . Hypertension   . Nephrolithiasis   . GERD (gastroesophageal reflux disease)   . Arthritis   . Psoriasis   .  Morbid obesity   . Mitral regurgitation   . Hyperlipidemia   . Sleep apnea     uses cpap  . CHF (congestive heart failure)     Diastolic. EF 55-65% by cath 10/11/11  . Diabetes mellitus     insulin dependent  . Acute renal insufficiency     09/2011. not on ACEI secondary to this (also has renal artery stenosis)  . Peripheral vascular disease     severe right renal artery stenosis and left SFA stenosis by PV angio 09/2011, treated medically  . Cellulitis   . CAD (coronary artery disease)     NSTEMI  09/2011 felt poor candidate for CABG, instead s/p PTCA/DES to mid RCA 10/17/11    Past Surgical History  Procedure Laterality Date  . Acromio-clavicular joint repair  2011  . Abdominal hysterectomy    . Eye surgery      cataract  OD    History reviewed. No pertinent family history.  Social History Theresa Mclean reports that she has never smoked. She has never used smokeless tobacco. Theresa Mclean reports that she does not drink alcohol.  Review of Systems Otherwise reviewed and negative except as outlined.  Physical Examination Blood pressure 148/50, pulse 63, temperature 98.8 F (37.1 C), temperature source Oral, resp. rate 28, height 5\' 4"  (1.626 m), weight 227 lb 8.2 oz (103.2 kg), last menstrual period 08/29/1990, SpO2 96.00%.  Intake/Output Summary (Last 24 hours) at 07/10/13 0807 Last data filed at 07/10/13 0600  Gross per 24 hour  Intake    240 ml  Output   1650 ml  Net  -1410 ml    Telemetry: NSR rates in the 60's.  HEENT: Conjunctiva and lids normal, oropharynx clear with moist mucosa. Neck: Supple, no elevated JVP, neck obese,  no carotid bruits, no thyromegaly. Lungs: Clear to auscultation, breathing labored with minimal exertion (i.e. sitting up in the bed) with grunting noises during expiration. Diminished breath sounds in the bases with poor inspiratory effort. Cardiac: Regular rate and rhythm, soft systolic murmur, no pericardial rub. Abdomen: Soft, nontender, obese, no hepatomegaly, bowel sounds present, no guarding or rebound. Extremities: 1+ pretibial pitting edema into the feet, distal pulses diminished. Skin: Warm and dry. Musculoskeletal: No kyphosis. Neuropsychiatric: Alert and oriented x3, affect grossly appropriate.  Prior Cardiac Testing/Procedures  Echocardiogram: 05/30/2013 1. Left ventricle: Mild posterior wall and moderate basal septal hypertrophy. The cavity size was normal. Systolic function was normal. The estimated ejection fraction was in the  range of 60% to 65%. Wall motion was normal; there were no regional wall motion abnormalities. Features are consistent with a pseudonormal left ventricular filling pattern, with concomitant abnormal relaxation and increased filling pressure (grade 2 diastolic dysfunction). Doppler parameters are consistent with high ventricular filling pressure. - Aortic valve: A mild gradient is noted across the LVOT. Poorly visualized. Mildly thickened, mildly calcified leaflets. Mean gradient: 12mm Hg (S). Valve area: 1.73cm^2(VTI). Valve area: 1.61cm^2 (Vmax). - Mitral valve: Calcified annulus. Mildly thickened leaflets . Mild regurgitation. - Left atrium: The atrium was mildly dilated. - Tricuspid valve: Mild regurgitation.  2. Cardiac Cath 09/2011 Final Conclusions:  1. Severe two-vessel coronary artery disease with severe stenosis of the right coronary artery and left circumflex  2. Moderate to Severe pulmonary hypertension  3. Normal LV systolic function  4. Severe right renal artery stenosis  5. Severe left superficial femoral artery stenosis  3. PCI 09/2011 The patient had been loaded with Brilinta already. A JR-4 guiding catheter was used to engage  the RCA. When the ACT was >200, a Cougar IC wire was advanced down the RCA. A 2.5 x 20 mm balloon was used to pre-dilate the severe stenosis in the mid RCA. A 2.75 x 28 mm Promus Element DES was deployed in the mid RCA. A 2.75 x 20 mm [place] balloon was used to post-dilate the stent x 2. There was an excellent result with good flow into the distal vessel. The stenosis was taken from 99% down to 0%. Successful PTCA/DES x 1 mid RCA.  4. Abdominal Aortogram 09/2011 Abdominal aortogram: The abdominal aorta is patent without aneurysm. The right renal artery has severe ostial stenosis. The left renal artery is patent. The iliac arteries are patent bilaterally  Left external iliac angiogram with runoff: The common femoral, deep femoral, superficial femoral  arteries are patent. There is severe diffuse stenosis in the midportion of the left superficial femoral artery. There is brisk two-vessel runoff to the left foot.    Lab Results  Basic Metabolic Panel:  Recent Labs Lab 07/09/13 1937  NA 138  K 3.8  CL 101  CO2 28  GLUCOSE 109*  BUN 15  CREATININE 0.77  CALCIUM 11.0*     CBC:  Recent Labs Lab 07/09/13 1937 07/10/13 0612  WBC 8.9 7.5  NEUTROABS 6.8  --   HGB 11.1* 10.5*  HCT 36.7 35.5*  MCV 80.7 81.2  PLT 207 PLATELET CLUMPS NOTED ON SMEAR, COUNT APPEARS ADEQUATE    Cardiac Enzymes:  Recent Labs Lab 07/09/13 1937 07/10/13 0041 07/10/13 0612  TROPONINI <0.30 <0.30 <0.30    Radiology: Dg Chest Portable 1 View  07/09/2013   CLINICAL DATA:  Chest pain, shortness of breath  EXAM: PORTABLE CHEST - 1 VIEW  COMPARISON:  04/26/2013  FINDINGS: Cardiomegaly with pulmonary vascular congestion and suspected mild interstitial edema.  Bibasilar opacities, likely atelectasis. Possible small right pleural effusion. No pneumothorax.  IMPRESSION: Cardiomegaly with suspected mild interstitial edema.  Possible small right pleural effusion.   Electronically Signed   By: Charline Bills M.D.   On: 07/09/2013 20:19     ECG: Normal sinus rhythm with PVCs, nonspecific T-wave abnormalities in the lateral leads, rate of 67 beats per minute.   Impression and Recommendations:  1. Acute on Chronic Diastolic CHF: Elevation in pro-BNP is minimal, but has has symptomatic DOE, PND, and LEE. Denies medical and dietary non-compliance. She is diuresing. Dry wt which was recorded in 2013 was 216 lbs,admission wt 229 lbs. She is now on IV lasix 60 mg Q 12 hours. Breathing status has improved somewhat, but not optimal, requiring O2 support. Continue diureses and monitor BMET as you are doing.   2. CAD: Most recent cath in February of 2013 demonstrated severe pulmonary hypertension, with severe 2 vessel disease. She has a stent to her RCA. She  remains on Plavix and ASA, atenolol and statin. Will discontinue NTG paste. Cardiac markers are negative X 3.  3. Hypertension: Blood pressure is controlled currently. May need to adjust medications once she is off the nitrates. Currently on hydralazine, clonidine, amlodipine, along with BB.  Echo demonstrates grade II diastolic dysfunction.       Signed: [name]. Lyman Bishop NP [facility] 07/10/2013, 8:07 AM Co-Sign MD  Attending Note  Patient seen and discussed with NP Lyman Bishop, I agree with her documentation above. Patient is an 79 yo female with multiple medical comorbidities including CAD with DES to RCA in 09/2011, diastolic heart failure with grade II diastolic dysfunction, HTN,  and DM. She had an episode of chest pain starting yesterday morning described as a pressure in her right chest with some SOB, this pain lasted up to 6-7 hours. She also described some feelings of abdominal fullness and nausea over this time. Over the last month she reports increasing SOB and DOE as well, but has had no prior chest pain. Her EKG shows no evidence of acute ischemia and cardiac enzymes have been negative. She had evidence of volume overload and was initiated on diuretics with improvement of her symptoms. Of note, from prior cath she was noted to have significant RCA and LCX disease in the setting of NSTEMI, to decrease her procedure risks the RCA was intervened on and the LCX was medically managed. Her chest pain story is mixed, with some characteristics suggestive both cardiac and non-cardiac causes, most notable cardiac pain typically does not last 6-7 hours consistently. She did present with decompensated diastolic heart failure, and there is concern that ischemia could possible be the exacerbating factor. We will continue to diurese her today, plan for a lexiscan to tomorrow to evaluate the functionality of this lesion and risk stratify her disease, pending results she may need to be considered  for a repeat cath. She reports she would be willing to undergo. Of note, she also reports a several month history of blood in her stools, this will need to be evaluated and may limit how aggressive we can be for her CAD.   Dina Rich MD

## 2013-07-10 NOTE — Progress Notes (Signed)
pts DBP noted to be in the 40's. All BP meds held and Dr. Sherrie Mustache notified. Nitropaste discontinued.

## 2013-07-10 NOTE — Progress Notes (Signed)
Report called to A.Gordon,RN. Patient transferred to 332 via bed in stable condition.

## 2013-07-10 NOTE — Progress Notes (Signed)
[facility] PROGRESS NOTE  Theresa Mclean WUJ:[id number] DOB: 08/30/1928 DOA: 07/09/2013 PCP: Ignatius Specking., MD    Code Status: Full code Family Communication: Family not available Disposition Plan: Anticipate discharge to home when she is clinically improved.   Consultants:  Cardiology  Procedures:  None  Antibiotics:  None  HPI/Subjective: The patient says that she is breathing better. Her only complaint is of being too cold.  Objective: Filed Vitals:   07/10/13 0940  BP: 112/43  Pulse: 67  Temp:   Resp: 27   Temperature 98.9   Intake/Output Summary (Last 24 hours) at 07/10/13 1024 Last data filed at 07/10/13 0830  Gross per 24 hour  Intake    240 ml  Output   2400 ml  Net  -2160 ml   Filed Weights   07/09/13 2345 07/10/13 0500 07/10/13 0600  Weight: 228 lb 13.4 oz (103.8 kg) 227 lb 4.7 oz (103.1 kg) 227 lb 8.2 oz (103.2 kg)    Exam:   General:  Pleasant 79 year old African-American woman laying in bed, in no acute distress.  Cardiovascular: S1, S2, with 1-2/6 systolic murmur.  Respiratory: Decreased breath sounds in the bases, with no audible wheezes or crackles anteriorly.  Abdomen: Positive bowel sounds, obese, nontender, nondistended.  Musculoskeletal/extremities: No acute hot red joints. 1+ pretibial and pedal pitting edema bilaterally.  Neurologic: She is alert and oriented x3. Cranial nerves II through XII are intact.   Data Reviewed: Basic Metabolic Panel:  Recent Labs Lab 07/09/13 1937  NA 138  K 3.8  CL 101  CO2 28  GLUCOSE 109*  BUN 15  CREATININE 0.77  CALCIUM 11.0*   Liver Function Tests: No results found for this basename: AST, ALT, ALKPHOS, BILITOT, PROT, ALBUMIN,  in the last 168 hours No results found for this basename: LIPASE, AMYLASE,  in the last 168 hours No results found for this basename: AMMONIA,  in the last 168 hours CBC:  Recent Labs Lab 07/09/13 1937 07/10/13 0612  WBC 8.9 7.5  NEUTROABS  6.8  --   HGB 11.1* 10.5*  HCT 36.7 35.5*  MCV 80.7 81.2  PLT 207 PLATELET CLUMPS NOTED ON SMEAR, COUNT APPEARS ADEQUATE   Cardiac Enzymes:  Recent Labs Lab 07/09/13 1937 07/10/13 0041 07/10/13 0612  TROPONINI <0.30 <0.30 <0.30   BNP (last 3 results)  Recent Labs  07/09/13 1937  PROBNP 390.6   CBG:  Recent Labs Lab 07/10/13 0024 07/10/13 0749 07/10/13 0821  GLUCAP 83 51* 71    Recent Results (from the past 240 hour(s))  MRSA PCR SCREENING     Status: Abnormal   Collection Time    07/10/13  2:16 AM      Result Value Range Status   MRSA by PCR POSITIVE (*) NEGATIVE Final   Comment:            The GeneXpert MRSA Assay (FDA     approved for NASAL specimens     only), is one component of a     comprehensive MRSA colonization     surveillance program. It is not     intended to diagnose MRSA     infection nor to guide or     monitor treatment for     MRSA infections.     RESULT CALLED TO, READ BACK BY AND VERIFIED WITH:     LEE,B AT 0354 ON 07/10/13 BY HUFFINES,S      Studies: Dg Chest Portable 1 View  07/09/2013   CLINICAL DATA:  Chest pain, shortness of breath  EXAM: PORTABLE CHEST - 1 VIEW  COMPARISON:  04/26/2013  FINDINGS: Cardiomegaly with pulmonary vascular congestion and suspected mild interstitial edema.  Bibasilar opacities, likely atelectasis. Possible small right pleural effusion. No pneumothorax.  IMPRESSION: Cardiomegaly with suspected mild interstitial edema.  Possible small right pleural effusion.   Electronically Signed   By: Charline Bills M.D.   On: 07/09/2013 20:19    Scheduled Meds: . amLODipine  10 mg Oral Daily  . aspirin EC  81 mg Oral Daily  . atenolol  75 mg Oral Daily  . atorvastatin  20 mg Oral QHS  . [START ON 07/15/2013] cloNIDine  0.2 mg Transdermal Weekly  . clopidogrel  75 mg Oral QAC breakfast  . docusate sodium  100 mg Oral BID  . folic acid  1 mg Oral Daily  . furosemide  60 mg Intravenous Q12H  . gabapentin  300 mg  Oral TID  . heparin  5,000 Units Subcutaneous Q8H  . hydrALAZINE  25 mg Oral TID  . insulin aspart  0-15 Units Subcutaneous TID WC  . insulin aspart  0-5 Units Subcutaneous QHS  . insulin glargine  45 Units Subcutaneous BID  . methotrexate  7.5 mg Oral Q Wed  . pantoprazole  40 mg Oral BID  . [START ON 07/11/2013] pneumococcal 23 valent vaccine  0.5 mL Intramuscular Tomorrow-1000  . sodium chloride  3 mL Intravenous Q12H   Continuous Infusions:   Assessment:  Principal Problem:   Acute on chronic diastolic heart failure Active Problems:   CAD (coronary artery disease)   CKD (chronic kidney disease)   Obstructive sleep apnea   DM (diabetes mellitus), type 2, uncontrolled, with renal complications  1. Acute on chronic diastolic congestive heart failure. She is diuresed over 2 L since admission. She is symptomatically improved. Cardiology has been consulted and is following. Appreciate their assistance.  Coronary artery disease. She denies pain currently. Her troponin I is negative x3. Status post recent catheterization in February 2013 demonstrating severe pulmonary hypertension with severe 2 vessel disease. Status post stenting to her right coronary artery. Continue Plavix, aspirin, beta blocker, and statin. Nitroglycerin paste has been discontinued per cardiology.  Hypertension. Her blood pressure is currently low-normal which may be secondary to the nitroglycerin paste. She is treated chronically with Catapres patch, atenolol, amlodipine, and hydralazine.  Severe pulmonary hypertension. She is on chronic oxygen therapy for possible cor pulmonale associated chronic respiratory failure.  Obstructive sleep apnea, on nightly CPAP.  Type 2 diabetes mellitus with one episode of hypoglycemia. Her blood glucose is low-normal. Will adjust insulin and give half an amp of D50.  Normocytic anemia. We'll order an anemia panel.     Plan: 1. We'll add potassium chloride  supplementation. 2. We'll order CPAP nightly. 3. We'll place blood pressure parameters on amlodipine and hydralazine to hold them if her systolic blood pressures less than 110. 4. Discontinue Lantus and continue sliding scale NovoLog. 5. We'll order an anemia panel and TSH.   Time spent: 40 minutes    [facility]  [facility] Pager [phone]. If 7PM-7AM, please contact night-coverage at [email or web address], password [facility] 07/10/2013, 10:24 AM  LOS: 1 day

## 2013-07-10 NOTE — Progress Notes (Signed)
UR chart review completed.  

## 2013-07-11 ENCOUNTER — Inpatient Hospital Stay (HOSPITAL_COMMUNITY): Payer: Medicare Other

## 2013-07-11 ENCOUNTER — Encounter (HOSPITAL_COMMUNITY): Payer: Self-pay

## 2013-07-11 DIAGNOSIS — N182 Chronic kidney disease, stage 2 (mild): Secondary | ICD-10-CM

## 2013-07-11 DIAGNOSIS — E1129 Type 2 diabetes mellitus with other diabetic kidney complication: Secondary | ICD-10-CM

## 2013-07-11 DIAGNOSIS — D649 Anemia, unspecified: Secondary | ICD-10-CM

## 2013-07-11 DIAGNOSIS — N058 Unspecified nephritic syndrome with other morphologic changes: Secondary | ICD-10-CM

## 2013-07-11 DIAGNOSIS — R079 Chest pain, unspecified: Secondary | ICD-10-CM

## 2013-07-11 HISTORY — DX: Chronic kidney disease, stage 2 (mild): N18.2

## 2013-07-11 LAB — CBC
HCT: 38.7 % (ref 36.0–46.0)
Hemoglobin: 11.7 g/dL — ABNORMAL LOW (ref 12.0–15.0)
MCH: 24.9 pg — ABNORMAL LOW (ref 26.0–34.0)
MCV: 82.5 fL (ref 78.0–100.0)
RBC: 4.69 MIL/uL (ref 3.87–5.11)

## 2013-07-11 LAB — HEPATIC FUNCTION PANEL
ALT: 12 U/L (ref 0–35)
Alkaline Phosphatase: 69 U/L (ref 39–117)
Bilirubin, Direct: 0.1 mg/dL (ref 0.0–0.3)
Indirect Bilirubin: 0.5 mg/dL (ref 0.3–0.9)
Total Bilirubin: 0.6 mg/dL (ref 0.3–1.2)

## 2013-07-11 LAB — GLUCOSE, CAPILLARY: Glucose-Capillary: 227 mg/dL — ABNORMAL HIGH (ref 70–99)

## 2013-07-11 LAB — BASIC METABOLIC PANEL
CO2: 30 mEq/L (ref 19–32)
Calcium: 10.5 mg/dL (ref 8.4–10.5)
Creatinine, Ser: 0.74 mg/dL (ref 0.50–1.10)
GFR calc Af Amer: 89 mL/min — ABNORMAL LOW (ref >90)
GFR calc non Af Amer: 77 mL/min — ABNORMAL LOW (ref >90)
Sodium: 143 mEq/L (ref 135–145)

## 2013-07-11 LAB — URINE CULTURE

## 2013-07-11 LAB — FOLATE: Folate: >20 ng/mL

## 2013-07-11 LAB — T4, FREE: Free T4: 1.37 ng/dL (ref 0.80–1.80)

## 2013-07-11 LAB — VITAMIN B12: Vitamin B-12: 297 pg/mL (ref 211–911)

## 2013-07-11 MED ORDER — SODIUM CHLORIDE 0.9 % IJ SOLN
INTRAMUSCULAR | Status: AC
  Administered 2013-07-11: 10 mL via INTRAVENOUS
  Filled 2013-07-11: qty 10

## 2013-07-11 MED ORDER — LIVING BETTER WITH HEART FAILURE BOOK
Freq: Once | Status: AC
  Administered 2013-07-11: 10:00:00
  Filled 2013-07-11: qty 1

## 2013-07-11 MED ORDER — TECHNETIUM TC 99M SESTAMIBI - CARDIOLITE
10.0000 | Freq: Once | INTRAVENOUS | Status: AC | PRN
  Administered 2013-07-11: 08:00:00 10 via INTRAVENOUS

## 2013-07-11 MED ORDER — FUROSEMIDE 40 MG PO TABS
60.0000 mg | ORAL_TABLET | Freq: Every day | ORAL | Status: DC
  Administered 2013-07-11 – 2013-07-12 (×2): 60 mg via ORAL
  Filled 2013-07-11 (×4): qty 1

## 2013-07-11 MED ORDER — INSULIN GLARGINE 100 UNIT/ML ~~LOC~~ SOLN
15.0000 [IU] | Freq: Two times a day (BID) | SUBCUTANEOUS | Status: DC
  Administered 2013-07-11 – 2013-07-12 (×3): 15 [IU] via SUBCUTANEOUS
  Filled 2013-07-11 (×9): qty 0.15

## 2013-07-11 MED ORDER — TECHNETIUM TC 99M SESTAMIBI - CARDIOLITE
30.0000 | Freq: Once | INTRAVENOUS | Status: AC | PRN
  Administered 2013-07-11: 30 via INTRAVENOUS

## 2013-07-11 MED ORDER — SENNOSIDES-DOCUSATE SODIUM 8.6-50 MG PO TABS
1.0000 | ORAL_TABLET | Freq: Two times a day (BID) | ORAL | Status: DC
  Administered 2013-07-11 – 2013-07-12 (×2): 1 via ORAL
  Filled 2013-07-11 (×2): qty 1

## 2013-07-11 MED ORDER — POLYETHYLENE GLYCOL 3350 17 G PO PACK
17.0000 g | PACK | Freq: Every day | ORAL | Status: DC
  Administered 2013-07-11 – 2013-07-12 (×2): 17 g via ORAL
  Filled 2013-07-11 (×2): qty 1

## 2013-07-11 MED ORDER — REGADENOSON 0.4 MG/5ML IV SOLN
INTRAVENOUS | Status: AC
  Administered 2013-07-11: 0.4 mg via INTRAVENOUS
  Filled 2013-07-11: qty 5

## 2013-07-11 NOTE — Clinical Documentation Improvement (Signed)
Black female with CKD noted. GFR range for this admission is 88-89. Please identify stage of CKD in next progress note and discharge summary.  _______CKD Stage I - GFR > OR = 90 _______CKD Stage II - GFR 60-80 _______CKD Stage III - GFR 30-59 _______CKD Stage IV - GFR 15-29 _______CKD Stage V - GFR < 15 _______ESRD (End Stage Renal Disease)     Thank You, Shellee Milo ,RN Clinical Documentation Specialist:  [phone]  [facility]- Health Information Management

## 2013-07-11 NOTE — Progress Notes (Signed)
Spoke with patient about diabetes control and home regimen for diabetes management.  According to the home medication list in the chart, patient is prescribed Lantus 53-58 units BID and Novolog 0-9 units TID as an outpatient for diabetes management.  Wanted to verify what patient was taking at home for diabetes management since blood sugar has been fairly controlled (on Novolog moderate correction scale) since admission without receiving any basal insulin.  In talking with the patient she reports that lately her blood glucose is "running good, between 80's to 120's mg/dl".  She reports that if her blood glucose is less than "190 mg/dl or so I don't take no shot first thing in the morning and at bedtime".   When asked if she was taking the Lantus regularly she states "No, I haven't been taking it regularly lately on account my blood sugar has been running good".  Inquired about low blood sugars and patient states that she rarely has a low blood sugar.  As an inpatient, patient has not received any basal insulin since being admitted up until today as Lantus 15 units BID was ordered to start today and patient received first dose at 14:39.  At time of discharge, may need to re-evaluate Lantus home dose.  Will continue to follow as an inpatient.  Thanks, Orlando Penner, RN, MSN, CCRN Diabetes Coordinator Inpatient Diabetes Program [phone] (Team Pager) [phone] (AP office) [phone] [facility] office)

## 2013-07-11 NOTE — Progress Notes (Signed)
Pt was assisted up to chair, she does well with 2 person assist. She uses a walker for additional support. Pt tolerated sitting up in the chair very well for several hours. Sheryn Bison

## 2013-07-11 NOTE — Progress Notes (Signed)
Stress Lab Nurses Notes - Theresa Mclean  Theresa Mclean 07/11/2013 Reason for doing test: Chest Pain and CHF Type of test: [name] / Inpatient room 332 Nurse performing test: Parke Poisson, RN Nuclear Medicine Tech: Lyndel Pleasure Echo Tech: Not Applicable MD performing test: Branch/ K.Lawrence NP Family MD: Sherril Croon Test explained and consent signed: yes IV started: 20g jelco, Saline lock flushed, No redness or edema and Saline lock from floor Symptoms: SOB & pressure in stomach Treatment/Intervention: None Reason test stopped: protocol completed After recovery IV was: No redness or edema and Saline Lock flushed Patient to return to Nuc. Med at : 10:30 Patient discharged: Transported back to room 332 via Stretcher Patient's Condition upon discharge was: stable Comments: During test BP 112/50 & HR 70.  Recovery BP 128/52 & HR 66.  Symptoms resolved in recovery. Continues to have O2 3L in progress. [name] T

## 2013-07-11 NOTE — Care Management Note (Addendum)
    Page 1 of 2   07/12/2013     1:34:22 PM   CARE MANAGEMENT NOTE 07/12/2013  Patient:  Theresa Mclean, Theresa Mclean   Account Number:  [id number]  Date Initiated:  07/11/2013  Documentation initiated by:  Rosemary Holms  Subjective/Objective Assessment:   Pt admitted from home where she lives with her son. Pt had HH with [facility] but it ended last week. Per [facility], pt was very noncompliant. They declined referral stating they had too many referral to provide [facility] care before the end of next     Action/Plan:   week. CM contacted [facility]. Per Lurena Joiner, send order when available and they will provide [facility] care.   Anticipated DC Date:  07/12/2013   Anticipated DC Plan:  HOME W HOME HEALTH SERVICES      DC Planning Services  CM consult      Choice offered to / List presented to:          [facility] arranged  HH-1 RN  HH-10 DISEASE MANAGEMENT  HH-2 PT  HH-4 NURSE'S AIDE      HH agency  [facility]   Status of service:  Completed, signed off Medicare Important Message given?  YES (If response is "NO", the following Medicare IM given date fields will be blank) Date Medicare IM given:  07/12/2013 Date Additional Medicare IM given:    Discharge Disposition:  HOME W HOME HEALTH SERVICES  Per UR Regulation:    If discussed at Long Length of Stay Meetings, dates discussed:    Comments:  07/11/13 Rosemary Holms RN BSN CM Updated son on DC plans. He stated that she often takes some of her pill and he finds she has not taken all. Pt lives with son and he is appreciative of what [facility] services provide.

## 2013-07-11 NOTE — Progress Notes (Signed)
[facility] PROGRESS NOTE  Theresa Mclean ZOX:[id number] DOB: 1929-07-06 DOA: 07/09/2013 PCP: Ignatius Specking., MD    Code Status: Full code Family Communication: Family not available Disposition Plan: Anticipate discharge to home when she is clinically improved.   Consultants:  Cardiology  Procedures:  Lexiscan  Antibiotics:  None  HPI/Subjective: The patient says that she is breathing better. She complains of left shoulder pain which she has intermittently. She has no complaints of shortness of breath or chest pain.  Objective: Filed Vitals:   07/11/13 0818  BP: 148/58  Pulse: 71  Temp:   Resp:    Temperature 99.5. Oxygen saturation 97%.   Intake/Output Summary (Last 24 hours) at 07/11/13 1302 Last data filed at 07/11/13 8119  Gross per 24 hour  Intake 198.83 ml  Output      0 ml  Net 198.83 ml   Filed Weights   07/09/13 2345 07/10/13 0500 07/10/13 0600  Weight: 103.8 kg (228 lb 13.4 oz) 103.1 kg (227 lb 4.7 oz) 103.2 kg (227 lb 8.2 oz)    Exam:   General:  Pleasant 79 year old African-American woman laying in bed, in no acute distress.  Cardiovascular: S1, S2, with 1-2/6 systolic murmur.  Respiratory: Decreased breath sounds in the bases, with no audible wheezes or crackles anteriorly.  Abdomen: Positive bowel sounds, obese, nontender, nondistended.  Musculoskeletal/extremities: No acute hot red joints. Resolution of bilateral lower extremity edema. Mild tenderness over the rotator cuff of the left arm, but with good range of motion.  Neurologic: She is alert and oriented x3. Cranial nerves II through XII are intact.   Data Reviewed: Basic Metabolic Panel:  Recent Labs Lab 07/09/13 1937 07/11/13 0509  NA 138 143  K 3.8 4.2  CL 101 103  CO2 28 30  GLUCOSE 109* 167*  BUN 15 11  CREATININE 0.77 0.74  CALCIUM 11.0* 10.5   Liver Function Tests:  Recent Labs Lab 07/11/13 0509  AST 12  ALT 12  ALKPHOS 69  BILITOT 0.6  PROT 7.7   ALBUMIN 3.3*   No results found for this basename: LIPASE, AMYLASE,  in the last 168 hours No results found for this basename: AMMONIA,  in the last 168 hours CBC:  Recent Labs Lab 07/09/13 1937 07/10/13 0612 07/11/13 0509  WBC 8.9 7.5 8.5  NEUTROABS 6.8  --   --   HGB 11.1* 10.5* 11.7*  HCT 36.7 35.5* 38.7  MCV 80.7 81.2 82.5  PLT 207 PLATELET CLUMPS NOTED ON SMEAR, COUNT APPEARS ADEQUATE 240   Cardiac Enzymes:  Recent Labs Lab 07/09/13 1937 07/10/13 0041 07/10/13 0612  TROPONINI <0.30 <0.30 <0.30   BNP (last 3 results)  Recent Labs  07/09/13 1937  PROBNP 390.6   CBG:  Recent Labs Lab 07/10/13 1136 07/10/13 1705 07/10/13 2045 07/11/13 0732 07/11/13 1202  GLUCAP 165* 134* 129* 163* 157*    Recent Results (from the past 240 hour(s))  URINE CULTURE     Status: None   Collection Time    07/09/13  9:00 PM      Result Value Range Status   Specimen Description URINE, CLEAN CATCH   Final   Special Requests NONE   Final   Culture  Setup Time     Final   Value: 07/09/2013 23:30     Performed at [facility]   Culture     Final   Value: Multiple bacterial morphotypes present, none predominant. Suggest appropriate recollection if clinically indicated.  Performed at [facility]   Report Status 07/11/2013 FINAL   Final  MRSA PCR SCREENING     Status: Abnormal   Collection Time    07/10/13  2:16 AM      Result Value Range Status   MRSA by PCR POSITIVE (*) NEGATIVE Final   Comment:            The GeneXpert MRSA Assay (FDA     approved for NASAL specimens     only), is one component of a     comprehensive MRSA colonization     surveillance program. It is not     intended to diagnose MRSA     infection nor to guide or     monitor treatment for     MRSA infections.     RESULT CALLED TO, READ BACK BY AND VERIFIED WITH:     LEE,B AT 0354 ON 07/10/13 BY HUFFINES,S      Studies: Dg Chest Portable 1 View  07/09/2013   CLINICAL DATA:   Chest pain, shortness of breath  EXAM: PORTABLE CHEST - 1 VIEW  COMPARISON:  04/26/2013  FINDINGS: Cardiomegaly with pulmonary vascular congestion and suspected mild interstitial edema.  Bibasilar opacities, likely atelectasis. Possible small right pleural effusion. No pneumothorax.  IMPRESSION: Cardiomegaly with suspected mild interstitial edema.  Possible small right pleural effusion.   Electronically Signed   By: Charline Bills M.D.   On: 07/09/2013 20:19    Scheduled Meds: . amLODipine  10 mg Oral Daily  . antiseptic oral rinse  15 mL Mouth Rinse q12n4p  . aspirin EC  81 mg Oral Daily  . atenolol  75 mg Oral Daily  . atorvastatin  20 mg Oral QHS  . chlorhexidine  15 mL Mouth Rinse BID  . Chlorhexidine Gluconate Cloth  6 each Topical Q0600  . [START ON 07/15/2013] cloNIDine  0.2 mg Transdermal Weekly  . clopidogrel  75 mg Oral QAC breakfast  . docusate sodium  100 mg Oral BID  . folic acid  1 mg Oral Daily  . furosemide  60 mg Oral Daily  . gabapentin  300 mg Oral TID  . heparin  5,000 Units Subcutaneous Q8H  . hydrALAZINE  25 mg Oral TID  . insulin aspart  0-15 Units Subcutaneous TID WC  . insulin aspart  0-5 Units Subcutaneous QHS  . methotrexate  7.5 mg Oral Q Wed  . mupirocin ointment  1 application Nasal BID  . pantoprazole  40 mg Oral BID  . pneumococcal 23 valent vaccine  0.5 mL Intramuscular Tomorrow-1000  . sodium chloride  3 mL Intravenous Q12H   Continuous Infusions:   Assessment:  Principal Problem:   Acute on chronic diastolic heart failure Active Problems:   CAD (coronary artery disease)   Obstructive sleep apnea   DM (diabetes mellitus), type 2, uncontrolled, with renal complications   Anemia   Chronic respiratory failure   Pulmonary hypertension   CKD (chronic kidney disease) stage 2, GFR 60-89 ml/min  1. Acute on chronic diastolic congestive heart failure. She is diuresed 1.6 L since admission. She is symptomatically improved. She has significantly  less peripheral edema. Cardiology has changed her Lasix from IV to 60 mg orally daily.  Coronary artery disease. Lexiscan stress test results are pending. She denies pain currently. Her troponin I is negative x3. Status post recent catheterization in February 2013 demonstrating severe pulmonary hypertension with severe 2 vessel disease. Status post stenting to her right coronary artery. Continue  Plavix, aspirin, beta blocker, and statin. Nitroglycerin paste has been discontinued per cardiology.  Hypertension. Her blood pressure has trended up since yesterday status post discontinuation of nitroglycerin paste. She is treated chronically with Catapres patch, atenolol, amlodipine, and hydralazine. These will be continued.  Severe pulmonary hypertension. She is on chronic oxygen therapy for possible chronic cor pulmonale associated chronic respiratory failure.  Obstructive sleep apnea, on nightly CPAP.  Type 2 diabetes mellitus with one episode of hypoglycemia. Status post half an amp of D50 on 07/10/2013. Insulin dosing was decreased. Her capillary blood glucose is currently under 200. We'll continue to monitor and adjust insulin accordingly. Her hemoglobin A1c is 7.4.  Stage II chronic kidney disease. Her creatinine is currently stable.  Normocytic anemia. Anemia panel has been ordered and is pending.  Mildly elevated TSH. We'll order free T4 for further evaluation.  Right shoulder pain. This is likely secondary to arthritis. We'll treat symptomatically with Tylenol and a heating pad.     Plan: 1. We'll check the results of the stress test pending. 2. Will restart Lantus at a lower dose at home dosing. 3. Out of bed to the chair. Order PT evaluation. 4. Further recommendations per cardiology.   Time spent: 40 minutes    [facility]  [facility] Pager [phone]. If 7PM-7AM, please contact night-coverage at [email or web address], password [facility] 07/11/2013, 1:02 PM  LOS: 2 days

## 2013-07-11 NOTE — Progress Notes (Signed)
Consulting cardiologist: Dina Rich MD  Subjective:    Denies recurrent chest pain. Still short of breath . Did not sleep well.  Objective:   Temp:  [98.6 F (37 C)-99.8 F (37.7 C)] 99.5 F (37.5 C) (11/13 0500) Pulse Rate:  [61-87] 71 (11/13 0818) Resp:  [24-32] 28 (11/13 0500) BP: (111-178)/(45-67) 148/58 mmHg (11/13 0818) SpO2:  [93 %-98 %] 97 % (11/13 0500) Last BM Date: 07/09/13  Filed Weights   07/09/13 2345 07/10/13 0500 07/10/13 0600  Weight: 228 lb 13.4 oz (103.8 kg) 227 lb 4.7 oz (103.1 kg) 227 lb 8.2 oz (103.2 kg)    Intake/Output Summary (Last 24 hours) at 07/11/13 1128 Last data filed at 07/11/13 0653  Gross per 24 hour  Intake 198.83 ml  Output      0 ml  Net 198.83 ml    Telemetry: NSR 70's.  Exam:  General: No acute distress.  HEENT: Conjunctiva and lids normal, oropharynx clear.  Lungs: Clear to auscultation, nonlabored. Grunting expirations. Poor inspiratory effort.  Cardiac: No elevated JVP or bruits. RRR, no gallop or rub.   Abdomen: Normoactive bowel sounds, obese. nontender, nondistended.  Extremities: No pitting edema, distal pulses full.  Neuropsychiatric: Alert and oriented x3, affect appropriate.   Lab Results:  Basic Metabolic Panel:  Recent Labs Lab 07/09/13 1937 07/11/13 0509  NA 138 143  K 3.8 4.2  CL 101 103  CO2 28 30  GLUCOSE 109* 167*  BUN 15 11  CREATININE 0.77 0.74  CALCIUM 11.0* 10.5    Liver Function Tests:  Recent Labs Lab 07/11/13 0509  AST 12  ALT 12  ALKPHOS 69  BILITOT 0.6  PROT 7.7  ALBUMIN 3.3*    CBC:  Recent Labs Lab 07/09/13 1937 07/10/13 0612 07/11/13 0509  WBC 8.9 7.5 8.5  HGB 11.1* 10.5* 11.7*  HCT 36.7 35.5* 38.7  MCV 80.7 81.2 82.5  PLT 207 PLATELET CLUMPS NOTED ON SMEAR, COUNT APPEARS ADEQUATE 240    Cardiac Enzymes:  Recent Labs Lab 07/09/13 1937 07/10/13 0041 07/10/13 0612  TROPONINI <0.30 <0.30 <0.30    BNP:  Recent Labs  07/09/13 1937    PROBNP 390.6     Radiology: Dg Chest Portable 1 View  07/09/2013   CLINICAL DATA:  Chest pain, shortness of breath  EXAM: PORTABLE CHEST - 1 VIEW  COMPARISON:  04/26/2013  FINDINGS: Cardiomegaly with pulmonary vascular congestion and suspected mild interstitial edema.  Bibasilar opacities, likely atelectasis. Possible small right pleural effusion. No pneumothorax.  IMPRESSION: Cardiomegaly with suspected mild interstitial edema.  Possible small right pleural effusion.   Electronically Signed   By: Charline Bills M.D.   On: 07/09/2013 20:19        Medications:   Scheduled Medications: . amLODipine  10 mg Oral Daily  . antiseptic oral rinse  15 mL Mouth Rinse q12n4p  . aspirin EC  81 mg Oral Daily  . atenolol  75 mg Oral Daily  . atorvastatin  20 mg Oral QHS  . chlorhexidine  15 mL Mouth Rinse BID  . Chlorhexidine Gluconate Cloth  6 each Topical Q0600  . [START ON 07/15/2013] cloNIDine  0.2 mg Transdermal Weekly  . clopidogrel  75 mg Oral QAC breakfast  . docusate sodium  100 mg Oral BID  . folic acid  1 mg Oral Daily  . furosemide  60 mg Intravenous Q12H  . gabapentin  300 mg Oral TID  . heparin  5,000 Units Subcutaneous Q8H  . hydrALAZINE  25 mg Oral TID  . insulin aspart  0-15 Units Subcutaneous TID WC  . insulin aspart  0-5 Units Subcutaneous QHS  . Living Better with Heart Failure Book   Does not apply Once  . methotrexate  7.5 mg Oral Q Wed  . mupirocin ointment  1 application Nasal BID  . pantoprazole  40 mg Oral BID  . pneumococcal 23 valent vaccine  0.5 mL Intramuscular Tomorrow-1000  . sodium chloride  3 mL Intravenous Q12H         PRN Medications:  sodium chloride, acetaminophen, ondansetron (ZOFRAN) IV, sodium chloride   Assessment and Plan:   1. Acute on Chronic Diastolic CHF: Diureses has slowed. Wt is essentially the same. She is still complaining of mild shortness of breath. Will now change back to po lasix. Was on lasix 40 mg daily at home. Will  have her dose increased to 60 mg po daily. Potassium in 4.2. Low salt diet is recommended at home. EF is normal per echo 05/2013.  2. CAD:  Stent to RCA 09/2011:  Lexiscan stress test completed today. No ischemic changes noted during stress portion. Nuclear scintigraphy to follow. No complaints of chest pain. Continue Plavix and ASA.  3. Hypertension: Moderately controlled. She has OSA which is contributing. Remains on amlodipine, clonidine and atenolol.  [name]. Lyman Bishop NP [facility] 07/11/2013, 11:28 AM  Attending Note Patient seen and discussed with NP Lyman Bishop, agree with documentation above. Lexiscan shows no evidence of ischemia. Continue risk factor modification and secondary prevention. Will defer the duration of her plavix to her primary cardiologist Dr Purvis Sheffield, continue for now. Agree with changing over to oral lasix. If blood pressure remains elevated can titrate up hydralazine. From cardiac standpoint if does okay ambulating with PT would be ok to discharge later today or tomorrow, follow with Dr Purvis Sheffield in 3-4 weeks.   Dina Rich MD

## 2013-07-12 LAB — BASIC METABOLIC PANEL
BUN: 19 mg/dL (ref 6–23)
Chloride: 103 mEq/L (ref 96–112)
GFR calc Af Amer: 68 mL/min — ABNORMAL LOW (ref >90)
Glucose, Bld: 173 mg/dL — ABNORMAL HIGH (ref 70–99)
Potassium: 3.9 mEq/L (ref 3.5–5.1)

## 2013-07-12 LAB — GLUCOSE, CAPILLARY
Glucose-Capillary: 144 mg/dL — ABNORMAL HIGH (ref 70–99)
Glucose-Capillary: 192 mg/dL — ABNORMAL HIGH (ref 70–99)

## 2013-07-12 MED ORDER — FUROSEMIDE 40 MG PO TABS: 80.0000 mg | ORAL_TABLET | Freq: Every day | ORAL | Status: DC

## 2013-07-12 MED ORDER — POLYETHYLENE GLYCOL 3350 17 G PO PACK: 17.0000 g | PACK | Freq: Every day | ORAL | Status: DC | PRN

## 2013-07-12 MED ORDER — INSULIN GLARGINE 100 UNIT/ML ~~LOC~~ SOLN: 12.0000 [IU] | Freq: Two times a day (BID) | SUBCUTANEOUS | Status: DC

## 2013-07-12 NOTE — Plan of Care (Signed)
Problem: Discharge Progression Outcomes Goal: Barriers To Progression Addressed/Resolved Outcome: Completed/Met Date Met:  07/12/13 HOME HEALTH PHYSICAL THERAPY, RN AND AID Goal: Able to perform self care activities Outcome: Adequate for Discharge WALKS WITH WALKER Goal: If EF < 40% ACEI/ARB addressed at discharge Outcome: Completed/Met Date Met:  07/12/13 EF 60-65 05/30/2013 Goal: Pain controlled with appropriate interventions Outcome: Completed/Met Date Met:  07/12/13 DENIES

## 2013-07-12 NOTE — Evaluation (Signed)
Physical Therapy Evaluation Patient Details Name: Theresa Mclean MRN: [id number] DOB: June 18, 1929 Today's Date: 07/12/2013 Time: 0865-7846 PT Time Calculation (min): 40 min  PT Assessment / Plan / Recommendation History of Present Illness  Pt is admitted with acute on chronic CHF.  She is morbidly obese, lives with her son and is minimally mobile there at baseline.  She is O2 dependent on 2 L O2/min and reports having bilateral knee arthritis.  Clinical Impression   Pt was seen for evaluation.  She is a very pleasant, morbidly obese female who is close to functional baseline.  Her gait is very labored due to obesity, illness and knee OA and she would benefit from HHPT at d/c.    PT Assessment  All further PT needs can be met in the next venue of care    Follow Up Recommendations  Home health PT    Does the patient have the potential to tolerate intense rehabilitation      Barriers to Discharge        Equipment Recommendations  None recommended by PT    Recommendations for Other Services     Frequency      Precautions / Restrictions Precautions Precautions: Fall Precaution Comments: orange precautions Restrictions Weight Bearing Restrictions: No   Pertinent Vitals/Pain       Mobility  Bed Mobility Bed Mobility: Not assessed Transfers Transfers: Sit to Stand;Stand to Sit Sit to Stand: 6: Modified independent (Device/Increase time);From chair/3-in-1;With upper extremity assist Stand to Sit: 6: Modified independent (Device/Increase time);To chair/3-in-1;With upper extremity assist Ambulation/Gait Ambulation/Gait Assistance: 5: Supervision Ambulation Distance (Feet): 50 Feet Assistive device: Rolling walker Gait Pattern: Shuffle;Trunk flexed;Decreased stance time - right Gait velocity: slow and labored Stairs: No Wheelchair Mobility Wheelchair Mobility: No    Exercises     PT Diagnosis: Difficulty walking;Generalized weakness  PT Problem List: Decreased  strength;Decreased activity tolerance;Decreased mobility;Obesity;Cardiopulmonary status limiting activity PT Treatment Interventions:       PT Goals(Current goals can be found in the care plan section) Acute Rehab PT Goals PT Goal Formulation: No goals set, d/c therapy  Visit Information  Last PT Received On: 07/12/13 History of Present Illness: Pt is admitted with acute on chronic CHF.  She is morbidly obese, lives with her son and is minimally mobile there at baseline.  She is O2 dependent on 2 L O2/min and reports having bilateral knee arthritis.       Prior Functioning  Home Living Living Arrangements: Children Prior Function Level of Independence: Needs assistance Gait / Transfers Assistance Needed: ambulates short distances with walker independently, transfers independently ADL's / Homemaking Assistance Needed: assist with all ADLs Communication Communication: No difficulties    Cognition  Cognition Arousal/Alertness: Awake/alert Behavior During Therapy: WFL for tasks assessed/performed Overall Cognitive Status: Within Functional Limits for tasks assessed    Extremity/Trunk Assessment Lower Extremity Assessment Lower Extremity Assessment: Generalized weakness   Balance    End of Session PT - End of Session Equipment Utilized During Treatment: Gait belt Activity Tolerance: Patient limited by fatigue Patient left: in chair;with call bell/phone within reach  GP     Myrlene Broker L 07/12/2013, 12:03 PM

## 2013-07-12 NOTE — Progress Notes (Signed)
PT transported home without O2 both pt and her son said she does not use O2 in auto.  I explained that she needed O2 to keep her heart strong Pt again said I don't use it in the car She assures me she has O2 at home and will apply once home

## 2013-07-12 NOTE — Discharge Summary (Signed)
Physician Discharge Summary  [name] Stoke ZOX:[id number] DOB: Apr 29, 1929 DOA: 07/09/2013  PCP: Ignatius Specking., MD  Admit date: 07/09/2013 Discharge date: 07/12/2013  Time spent: Greater than 30 minutes  Recommendations for Outpatient Follow-up:  1. Recommend outpatient referral to gastroenterology for evaluation of reported blood in her stools at home. 2. Recommend followup of her renal function.  Discharge Diagnoses:   1. Acute on chronic diastolic heart failure. The patient diuresed -5.3 L. Her recorded weight on admission was 229 pounds. At the time of discharge, her weight was 220 pounds and 12 ounces. In October 2014, the patient's ejection fraction was 60-65%. She had grade 2 diastolic dysfunction. 2. Chronic hypoxic respiratory failure. Query chronic cor pulmonale. 3. Obstructive sleep apnea, on CPAP. 4. Coronary artery disease status post stenting to the right coronary artery in February 2013. Lexiscan scan stress test was negative during this hospitalization. 5. Severe pulmonary hypertension per cardiac catheterization in 2013. 6. Mild iron deficiency anemia. Patient's hemoglobin was 11.7 at discharge. The patient's anemia panel revealed a total iron 23, TIBC of 249, ferritin 120, folate of greater than 20, and vitamin B12 of 297. 7. This requiring diabetes mellitus. 8. Morbid obesity. 9. Mildly low TSH of 5.5, but normal free T4-1 0.37. 10. Hypertension.  Discharge Condition: Improved.  Diet recommendation: heart healthy, carbohydrate modified.  Filed Weights   07/10/13 0500 07/10/13 0600 07/12/13 0500  Weight: 103.1 kg (227 lb 4.7 oz) 103.2 kg (227 lb 8.2 oz) 100.154 kg (220 lb 12.8 oz)    History of present illness:  The patient is an 79 year old woman with a history of coronary artery disease, chronic diastolic heart failure, diabetes mellitus, and obstructive sleep apnea, who presented to the emergency department on 07/09/2013 with a chief complaint of shortness of  breath and chest pain. In the emergency department, she was mildly hypertensive and mildly tachypnea. She was oxygenating 100% on 3 L of oxygen. Her troponin I was within normal limits. Her proBNP was 391. Her chest x-ray revealed mild interstitial edema. She was admitted for further evaluation and management.  Hospital Course:   1. Acute on chronic diastolic congestive heart failure. The patient takes 80 mg of Lasix at home chronically. Oral Lasix was withheld. She was started on 60 mg of Lasix IV every 12 hours. Nitroglycerin paste was given for 24 hours. Her other cardiac medications were continued. She diuresed over 5 L. Her weight decreased from 229 pounds to approximately 221 pounds. She was discharged on 80 mg of Lasix daily which was slightly increased from 60 mg daily which she had been changed to by cardiology during the hospitalization.    Coronary artery disease. The patient denied chest pain during hospitalization. Her troponin I was negative x3. She was maintained on Plavix, aspirin, atenolol, amlodipine, and hydralazine. She is status post a cardiac catheterization in February 2013 demonstrating severe pulmonary hypertension with severe 2 vessel disease. Status post stenting to her right coronary artery. Cardiology was consulted. They recommended further evaluation with a stress test. The Lexiscan revealed no evidence of ischemia. I discussed discontinuation of Plavix with cardiologist, Dr. Wyline Mood in the setting of reported blood in her stools at home. He agreed that the Plavix could be discontinued since the patient had her stent put in more than 18 months ago. She was maintained on aspirin daily however.   Hypertension.  She is treated chronically with Catapres patch, atenolol, amlodipine, and hydralazine. These medications were continued. Her blood pressure ranged from 137-151 systolically. Cardiology  mentioned that hydralazine could be titrated up in the outpatient setting, but this  will be deferred to her primary cardiologist.  Severe pulmonary hypertension.  She was maintained on chronic oxygen therapy for possible chronic cor pulmonale associated chronic respiratory failure. Her oxygen saturations ranged from 97-100% on 2 L of oxygen.  Obstructive sleep apnea She was maintained on CPAP nightly.   Type 2 diabetes mellitus with one episode of hypoglycemia. Lantus was discontinued temporarily when her blood glucose fell into the 60s. Sliding scale NovoLog was adjusted. She was given a half amp of D50. Her capillary blood glucose began to increase again. She was restarted on Lantus at a fraction of her home dose. She was advised to continue the lower dose of Lantus and follow the sliding scale insulin as previously prescribed. Her hemoglobin A1c was 7.4.    Stage II chronic kidney disease. Her creatinine is currently stable at 0.74-0.89.   Normocytic anemia. The results of the anemia panel were dictated above. The patient mentioned that she had blood in her stools at home, but this was not confirmed during the hospitalization. Nevertheless, she was maintained on Protonix. Plavix was discontinued. Her son was given the number to a local gastroenterologist to call for an appointment. (GI offices are closed currently).  Mildly elevated TSH. The patient's TSH was mildly elevated at 5.5, but her free T4 was within normal limits.      Procedures:  Lexiscan stress test, no ischemia noted.  Consultations:  [place] [facility]  Discharge Exam: Filed Vitals:   07/12/13 0900  BP: 144/64  Pulse: 65  Temp:   Resp: 24   temperature 99.1   General: Pleasant 79 year old African-American woman laying in bed, in no acute distress. Cardiovascular: S1, S2, with 1-2/6 systolic murmur. Respiratory: Decreased breath sounds in the bases, with no audible wheezes or crackles anteriorly. Abdomen: Positive bowel sounds, obese, nontender, nondistended. Musculoskeletal/extremities: No  acute hot red joints. Resolution of bilateral lower extremity edema. Mild tenderness over the rotator cuff of the left arm, but with good range of motion. Neurologic: She is alert and oriented x3. Cranial nerves II through XII are intact.     Discharge Instructions  Discharge Orders   Future Appointments Provider Department Dept Phone   07/29/2013 3:20 PM Laqueta Linden, MD [facility] [phone]   Future Orders Complete By Expires   Diet - low sodium heart healthy  As directed    Diet Carb Modified  As directed    Discharge instructions  As directed    Comments:     Stop taking  Plavix. Continue aspirin 81 mg daily.  Please call to make an appointment with Dr. Darrick Penna who is a gastroenterologist for evaluation of blood in your stools. Call Dr. Sherril Croon office for followup appointment.   Increase activity slowly  As directed        Medication List    STOP taking these medications       clopidogrel 75 MG tablet  Commonly known as:  PLAVIX      TAKE these medications       amLODipine 10 MG tablet  Commonly known as:  NORVASC  Take 10 mg by mouth daily.     aspirin 81 MG tablet  Take 81 mg by mouth daily.     atenolol 50 MG tablet  Commonly known as:  TENORMIN  Take 75 mg by mouth daily.     atorvastatin 20 MG tablet  Commonly known as:  LIPITOR  Take 20 mg by mouth at bedtime.     clobetasol 0.05 % topical foam  Commonly known as:  OLUX  Apply to scalp daily as needed     cloNIDine 0.2 mg/24hr patch  Commonly known as:  CATAPRES - Dosed in mg/24 hr  Place 1 patch onto the skin once a week.     docusate sodium 100 MG capsule  Commonly known as:  COLACE  Take 100 mg by mouth 2 (two) times daily.     fexofenadine 180 MG tablet  Commonly known as:  ALLEGRA  Take 180 mg by mouth daily. During allergy seasons.     fluocinonide ointment 0.05 %  Commonly known as:  LIDEX  Apply to affected areas of the body daily as needed. Not to  face.     folic acid 1 MG tablet  Commonly known as:  FOLVITE  Take 1 mg by mouth daily.     furosemide 40 MG tablet  Commonly known as:  LASIX  Take 2 tablets (80 mg total) by mouth daily.     gabapentin 300 MG capsule  Commonly known as:  NEURONTIN  Take 300 mg by mouth 4 (four) times daily.     hydrALAZINE 25 MG tablet  Commonly known as:  APRESOLINE  Take 25 mg by mouth 3 (three) times daily.     insulin aspart 100 UNIT/ML injection  Commonly known as:  novoLOG  - SLIDING SCALE 0-9 Units, Injected subcutaneous, 3 times daily with meals.  - CBG < 70: implement hypoglycemia protocol  - CBG 70 - 120: 0 units  - CBG 121 - 150: 1 unit  - CBG 151 - 200: 2 units  - CBG 201 - 250: 3 units  - CBG 251 - 300: 5 units  - CBG 301 - 350: 7 units  - CBG 351 - 400: 9 units  - CBG > 400: call MD     insulin glargine 100 UNIT/ML injection  Commonly known as:  LANTUS  Inject 0.12 mLs (12 Units total) into the skin 2 (two) times daily.     isosorbide mononitrate 60 MG 24 hr tablet  Commonly known as:  IMDUR  Take 60 mg by mouth daily.     methotrexate 2.5 MG tablet  Commonly known as:  RHEUMATREX  Take 7.5 mg by mouth every Wednesday. Caution:Chemotherapy. Protect from light. Takes on Wednesday.     montelukast 10 MG tablet  Commonly known as:  SINGULAIR  Take 10 mg by mouth daily.     nitroGLYCERIN 0.4 MG SL tablet  Commonly known as:  NITROSTAT  Place 0.4 mg under the tongue every 5 (five) minutes as needed for chest pain.     pantoprazole 40 MG tablet  Commonly known as:  PROTONIX  Take 40 mg by mouth 2 (two) times daily.     polyethylene glycol packet  Commonly known as:  MIRALAX / GLYCOLAX  Take 17 g by mouth daily as needed for moderate constipation.     potassium chloride SA 20 MEQ tablet  Commonly known as:  K-DUR,KLOR-CON  Take 20 mEq by mouth daily.     Vitamin D-3 1000 UNITS Caps  Take 1,000 Units by mouth daily.       No Known Allergies      Follow-up Information   Follow up with Laqueta Linden, MD On 07/29/2013. (3:20 pm [place] office)    Specialty:  Cardiology   Contact information:   [address] [place] [address] [phone]       Follow up with [name],[name], MD. Schedule an appointment as soon as possible for a visit in 1 week.   Specialty:  Internal Medicine   Contact information:   [address] [place] [address] [phone]       Call Jonette Eva, MD. (Gastroenterologist. Call to make an appointment to assess blood in your stool. [phone])    Specialty:  Gastroenterology   Contact information:   [address] [place] [address] [phone]        The results of significant diagnostics from this hospitalization (including imaging, microbiology, ancillary and laboratory) are listed below for reference.    Significant Diagnostic Studies: Nm Myocar Single W/spect W/wall Motion And Ef  07/11/2013   CLINICAL DATA:  79 year old female with history of CAD admitted with chest pain  EXAM: MYOCARDIAL IMAGING WITH SPECT (REST AND PHARMACOLOGIC-STRESS)  GATED LEFT VENTRICULAR WALL MOTION STUDY  LEFT VENTRICULAR EJECTION FRACTION  TECHNIQUE: Standard myocardial SPECT imaging was performed after resting intravenous injection of 10 mCi Tc-2m sestamibi. Subsequently, intravenous infusion of Lexiscan was performed under the supervision of the Cardiology staff. At peak effect of the drug, 30 mCi Tc-14m sestamibi was injected intravenously and standard myocardial SPECT imaging was performed. Quantitative gated imaging was also performed to evaluate left ventricular wall motion, and estimate left ventricular ejection fraction.  COMPARISON:  None.  FINDINGS: Baseline EKG showed sinus rhythm, LVH with associated ST/T changes. Following injection blood pressure decreased from 122/58 to 112/50 and heart rate increased from 58 bpm to 70 bpm. Following injection there were no EKG  changes specific for ischemia and no arrhythmias. The patient did not experience typical cardiac chest pain.  Raw images showed appropriate myocardial uptake. There is a small anteroseptal wall defect in the rest images that is less prominent in the post-injection images, this is consistent with breast attenuation artifact. There are no other perfusion defects. Gated images showed EDV 90 mL, ESV 29 mL, LVEF 68%, TID 1.08. Left ventricular wall motion and systolic function were normal  IMPRESSION: 1.  Negative lexiscan MPI for ischemia  2.  Normal left ventricular systolic function and wall motion  3.  Low risk study for major cardiac events.   Electronically Signed   By: Dina Rich   On: 07/11/2013 14:11   Dg Chest Portable 1 View  07/09/2013   CLINICAL DATA:  Chest pain, shortness of breath  EXAM: PORTABLE CHEST - 1 VIEW  COMPARISON:  04/26/2013  FINDINGS: Cardiomegaly with pulmonary vascular congestion and suspected mild interstitial edema.  Bibasilar opacities, likely atelectasis. Possible small right pleural effusion. No pneumothorax.  IMPRESSION: Cardiomegaly with suspected mild interstitial edema.  Possible small right pleural effusion.   Electronically Signed   By: Charline Bills M.D.   On: 07/09/2013 20:19    Microbiology: Recent Results (from the past 240 hour(s))  URINE CULTURE     Status: None   Collection Time    07/09/13  9:00 PM      Result Value Range Status   Specimen Description URINE, CLEAN CATCH   Final   Special Requests NONE   Final   Culture  Setup Time     Final   Value: 07/09/2013 23:30     Performed at [facility]   Culture     Final   Value: Multiple bacterial morphotypes present, none predominant. Suggest appropriate recollection  if clinically indicated.     Performed at [facility]   Report Status 07/11/2013 FINAL   Final  MRSA PCR SCREENING     Status: Abnormal   Collection Time    07/10/13  2:16 AM      Result Value Range Status    MRSA by PCR POSITIVE (*) NEGATIVE Final   Comment:            The GeneXpert MRSA Assay (FDA     approved for NASAL specimens     only), is one component of a     comprehensive MRSA colonization     surveillance program. It is not     intended to diagnose MRSA     infection nor to guide or     monitor treatment for     MRSA infections.     RESULT CALLED TO, READ BACK BY AND VERIFIED WITH:     LEE,B AT 0354 ON 07/10/13 BY HUFFINES,S      Labs: Basic Metabolic Panel:  Recent Labs Lab 07/09/13 1937 07/11/13 0509 07/12/13 0535  NA 138 143 142  K 3.8 4.2 3.9  CL 101 103 103  CO2 28 30 33*  GLUCOSE 109* 167* 173*  BUN 15 11 19   CREATININE 0.77 0.74 0.89  CALCIUM 11.0* 10.5 10.5   Liver Function Tests:  Recent Labs Lab 07/11/13 0509  AST 12  ALT 12  ALKPHOS 69  BILITOT 0.6  PROT 7.7  ALBUMIN 3.3*   No results found for this basename: LIPASE, AMYLASE,  in the last 168 hours No results found for this basename: AMMONIA,  in the last 168 hours CBC:  Recent Labs Lab 07/09/13 1937 07/10/13 0612 07/11/13 0509  WBC 8.9 7.5 8.5  NEUTROABS 6.8  --   --   HGB 11.1* 10.5* 11.7*  HCT 36.7 35.5* 38.7  MCV 80.7 81.2 82.5  PLT 207 PLATELET CLUMPS NOTED ON SMEAR, COUNT APPEARS ADEQUATE 240   Cardiac Enzymes:  Recent Labs Lab 07/09/13 1937 07/10/13 0041 07/10/13 0612  TROPONINI <0.30 <0.30 <0.30   BNP: BNP (last 3 results)  Recent Labs  07/09/13 1937  PROBNP 390.6   CBG:  Recent Labs Lab 07/11/13 1202 07/11/13 1644 07/11/13 2316 07/12/13 0802 07/12/13 1110  GLUCAP 157* 185* 227* 144* 192*       Signed:  Alonah Lineback  [facility] 07/12/2013, 1:42 PM

## 2013-07-24 ENCOUNTER — Telehealth: Payer: Self-pay | Admitting: Cardiovascular Disease

## 2013-07-24 NOTE — Telephone Encounter (Signed)
Spoke with son - stated she was laying down.  Stated that BP was taken by therapist yesterday.  Said she was hurting some yesterday so they did not work with her due to that & low BP.  Advised that it is very difficult to advise on changing anything based off of one reading.  Advised him to continue to monitor BP & keep log.  Bring to OV on Monday, 07/29/2013 with Dr. Purvis Sheffield in [place] office.  Son verbalized understanding.

## 2013-07-24 NOTE — Telephone Encounter (Signed)
Theresa Mclean came in to verify mother's appointment. States that her Bp was 121/43 yesterday.

## 2013-07-29 ENCOUNTER — Ambulatory Visit (INDEPENDENT_AMBULATORY_CARE_PROVIDER_SITE_OTHER): Payer: Medicare Other | Admitting: Cardiovascular Disease

## 2013-07-29 ENCOUNTER — Encounter: Payer: Self-pay | Admitting: Cardiovascular Disease

## 2013-07-29 VITALS — BP 153/66 | HR 66 | Ht 64.0 in

## 2013-07-29 DIAGNOSIS — N189 Chronic kidney disease, unspecified: Secondary | ICD-10-CM

## 2013-07-29 DIAGNOSIS — I10 Essential (primary) hypertension: Secondary | ICD-10-CM

## 2013-07-29 DIAGNOSIS — E785 Hyperlipidemia, unspecified: Secondary | ICD-10-CM

## 2013-07-29 DIAGNOSIS — I739 Peripheral vascular disease, unspecified: Secondary | ICD-10-CM

## 2013-07-29 DIAGNOSIS — I2510 Atherosclerotic heart disease of native coronary artery without angina pectoris: Secondary | ICD-10-CM

## 2013-07-29 DIAGNOSIS — I5032 Chronic diastolic (congestive) heart failure: Secondary | ICD-10-CM

## 2013-07-29 MED ORDER — FUROSEMIDE 40 MG PO TABS: 80.0000 mg | ORAL_TABLET | Freq: Every day | ORAL | Status: DC

## 2013-07-29 MED ORDER — NITROGLYCERIN 0.4 MG SL SUBL: 0.4000 mg | SUBLINGUAL_TABLET | SUBLINGUAL | Status: DC | PRN

## 2013-07-29 NOTE — Progress Notes (Signed)
Patient ID: Theresa Mclean, female   DOB: Dec 21, 1928, 79 y.o.   MRN: [id number]      SUBJECTIVE: Theresa Mclean is an 79 year old female with a history of non-ST elevation myocardial infarction in February 2013. She was treated with PTCA and DES to the mid RCA. She has multiple risk factors including diabetes mellitus and poorly controlled hypertension. She has a history of diastolic heart failure, and was recently hospitalized at [facility] for a heart failure exacerbation. She diuresed over 5 liters and her weight went from 229 lbs on admission to 221 lbs. A nuclear MPI study was done to evaluate for ischemia and was found to be normal. She was discharged on 80 mg of Lasix daily (an increase of her prior dose of 60 mg daily). Plavix was discontinued due to continued bouts of hematochezia. An echo in 05/2013 revealed normal left ventricular systolic function, LVEF is 60-65% with grade 2 diastolic dysfunction. She uses CPAP for sleep apnea. She is maintained on chronic oxygen therapy (3L) for possible chronic cor pulmonale-associated chronic respiratory failure.  She currently denies chest pain and shortness of breath. She has some chronic leg swelling which has not gotten any worse since she was discharged from the hospital. She has not been paying attention to her bowel movements but thinks that the hematochezia has resolved.     No Known Allergies  Current Outpatient Prescriptions  Medication Sig Dispense Refill  . amLODipine (NORVASC) 10 MG tablet Take 10 mg by mouth daily.      Marland Kitchen aspirin 81 MG tablet Take 81 mg by mouth daily.      Marland Kitchen atenolol (TENORMIN) 50 MG tablet Take 75 mg by mouth daily.      Marland Kitchen atorvastatin (LIPITOR) 20 MG tablet Take 20 mg by mouth at bedtime.       . Cholecalciferol (VITAMIN D-3) 1000 UNITS CAPS Take 1,000 Units by mouth daily.      . clobetasol (OLUX) 0.05 % topical foam Apply to scalp daily as needed      . cloNIDine (CATAPRES - DOSED IN MG/24 HR) 0.2 mg/24hr patch Place 1  patch onto the skin once a week.      . docusate sodium (COLACE) 100 MG capsule Take 100 mg by mouth 2 (two) times daily.       . fexofenadine (ALLEGRA) 180 MG tablet Take 180 mg by mouth daily. During allergy seasons.      . fluocinonide ointment (LIDEX) 0.05 % Apply to affected areas of the body daily as needed. Not to face.      . folic acid (FOLVITE) 1 MG tablet Take 1 mg by mouth daily.      . furosemide (LASIX) 40 MG tablet Take 2 tablets (80 mg total) by mouth daily.      Marland Kitchen gabapentin (NEURONTIN) 300 MG capsule Take 300 mg by mouth 4 (four) times daily.       . hydrALAZINE (APRESOLINE) 25 MG tablet Take 25 mg by mouth 3 (three) times daily.      . insulin aspart (NOVOLOG) 100 UNIT/ML injection SLIDING SCALE 0-9 Units, Injected subcutaneous, 3 times daily with meals. CBG < 70: implement hypoglycemia protocol CBG 70 - 120: 0 units CBG 121 - 150: 1 unit CBG 151 - 200: 2 units CBG 201 - 250: 3 units CBG 251 - 300: 5 units CBG 301 - 350: 7 units CBG 351 - 400: 9 units CBG > 400: call MD      . insulin glargine (  LANTUS) 100 UNIT/ML injection Inject 0.12 mLs (12 Units total) into the skin 2 (two) times daily.      . isosorbide mononitrate (IMDUR) 60 MG 24 hr tablet Take 60 mg by mouth daily.      . methotrexate (RHEUMATREX) 2.5 MG tablet Take 7.5 mg by mouth every Wednesday. Caution:Chemotherapy. Protect from light. Takes on Wednesday.      . montelukast (SINGULAIR) 10 MG tablet Take 10 mg by mouth daily.      . nitroGLYCERIN (NITROSTAT) 0.4 MG SL tablet Place 0.4 mg under the tongue every 5 (five) minutes as needed for chest pain.      . pantoprazole (PROTONIX) 40 MG tablet Take 40 mg by mouth 2 (two) times daily.       . polyethylene glycol (MIRALAX / GLYCOLAX) packet Take 17 g by mouth daily as needed for moderate constipation.      . potassium chloride SA (K-DUR,KLOR-CON) 20 MEQ tablet Take 20 mEq by mouth daily.       No current facility-administered medications for this visit.     Past Medical History  Diagnosis Date  . Hypertension   . Nephrolithiasis   . GERD (gastroesophageal reflux disease)   . Arthritis   . Psoriasis   . Morbid obesity   . Mitral regurgitation   . Hyperlipidemia   . Sleep apnea     uses cpap  . CHF (congestive heart failure)     Diastolic. EF 55-65% by cath 10/11/11  . Diabetes mellitus     insulin dependent  . Acute renal insufficiency     09/2011. not on ACEI secondary to this (also has renal artery stenosis)  . Peripheral vascular disease     severe right renal artery stenosis and left SFA stenosis by PV angio 09/2011, treated medically  . Cellulitis   . CAD (coronary artery disease)     NSTEMI 09/2011 felt poor candidate for CABG, instead s/p PTCA/DES to mid RCA 10/17/11  . Chronic respiratory failure   . Pulmonary hypertension   . Obstructive sleep apnea   . CKD (chronic kidney disease) stage 2, GFR 60-89 ml/min 07/11/2013    Past Surgical History  Procedure Laterality Date  . Acromio-clavicular joint repair  2011  . Abdominal hysterectomy    . Eye surgery      cataract  OD    History   Social History  . Marital Status: Divorced    Spouse Name: N/A    Number of Children: N/A  . Years of Education: N/A   Occupational History  . Not on file.   Social History Main Topics  . Smoking status: Never Smoker   . Smokeless tobacco: Never Used  . Alcohol Use: No  . Drug Use: No  . Sexual Activity: No   Other Topics Concern  . Not on file   Social History Narrative  . No narrative on file     BP 153/66 Pulse 66   PHYSICAL EXAM General: NAD  Neck: No JVD, no thyromegaly or thyroid nodule.  Lungs: Clear to auscultation bilaterally with normal respiratory effort.  CV: Nondisplaced PMI. Heart regular S1/S2, no S3/S4, no murmur. Trace LE peripheral edema. No carotid bruit. Normal pedal pulses.  Abdomen: Soft, nontender, no hepatosplenomegaly, no distention.  Neurologic: Alert and oriented x 3.  Psych: Normal  affect.  Extremities: No clubbing or cyanosis.    ECG: reviewed and available in electronic records.      ASSESSMENT AND PLAN: 1. CAD: stable. Continue  ASA, Lipitor, Atenolol, and Imdur. Nuclear MPI study negative for ischemia. 2. HTN: suboptimally controlled. Her son is going to purchase a BP monitor this week. I have asked the patient to check blood pressure readings 4-5 times per week, at different times throughout the day, in order to get a better approximation of mean BP values. These results will be provided to me at the end of that period so that I can determine if antihypertensive medication titration is indicated. Given her resting bradycardia, I am not inclined to increase her atenolol. She is not on an ACEI reportedly because of severe right renal artery stenosis. She is on long-acting nitrates. I would consider increasing hydralazine to 50 mg TID.  3. Chronic diastolic heart failure: she has grade II diastolic dysfunction. Will continue Lasix and hydralazine at present dosages. She appears to be euvolemic. If her BP remains elevated, I would consider increasing hydralazine to avoid future decompensation.  4. PVD: severe right renal artery stenosis and left SFA stenosis by PV angio 09/2011.  5. Hyperlipidemia: on Lipitor.       Prentice Docker, M.D., F.A.C.C.

## 2013-07-29 NOTE — Patient Instructions (Signed)
   Nitroglycerin as needed for severe chest pain only - refill sent to pharm  Lasix - added note that an extra Lasix 40mg  may be taken as needed for increased leg swelling and/or shortness of breath Continue all other medications.   Monitor blood pressure over next month & bring reading back for MD review.   Follow up in  6-8 weeks

## 2013-08-01 DIAGNOSIS — I878 Other specified disorders of veins: Secondary | ICD-10-CM | POA: Insufficient documentation

## 2013-08-10 ENCOUNTER — Inpatient Hospital Stay (HOSPITAL_COMMUNITY)
Admission: EM | Admit: 2013-08-10 | Discharge: 2013-08-16 | DRG: 291 | Disposition: A | Discharge status: Home Health | Payer: Medicare Other | Attending: Internal Medicine | Admitting: Internal Medicine

## 2013-08-10 ENCOUNTER — Emergency Department (HOSPITAL_COMMUNITY): Payer: Medicare Other

## 2013-08-10 ENCOUNTER — Encounter (HOSPITAL_COMMUNITY): Payer: Self-pay | Admitting: Emergency Medicine

## 2013-08-10 DIAGNOSIS — Z794 Long term (current) use of insulin: Secondary | ICD-10-CM

## 2013-08-10 DIAGNOSIS — I272 Pulmonary hypertension, unspecified: Secondary | ICD-10-CM

## 2013-08-10 DIAGNOSIS — I509 Heart failure, unspecified: Secondary | ICD-10-CM | POA: Diagnosis present

## 2013-08-10 DIAGNOSIS — R0789 Other chest pain: Secondary | ICD-10-CM

## 2013-08-10 DIAGNOSIS — Z7982 Long term (current) use of aspirin: Secondary | ICD-10-CM

## 2013-08-10 DIAGNOSIS — Z79899 Other long term (current) drug therapy: Secondary | ICD-10-CM

## 2013-08-10 DIAGNOSIS — M129 Arthropathy, unspecified: Secondary | ICD-10-CM | POA: Diagnosis present

## 2013-08-10 DIAGNOSIS — I739 Peripheral vascular disease, unspecified: Secondary | ICD-10-CM | POA: Diagnosis present

## 2013-08-10 DIAGNOSIS — J9620 Acute and chronic respiratory failure, unspecified whether with hypoxia or hypercapnia: Secondary | ICD-10-CM | POA: Diagnosis present

## 2013-08-10 DIAGNOSIS — E119 Type 2 diabetes mellitus without complications: Secondary | ICD-10-CM | POA: Diagnosis present

## 2013-08-10 DIAGNOSIS — J9610 Chronic respiratory failure, unspecified whether with hypoxia or hypercapnia: Secondary | ICD-10-CM

## 2013-08-10 DIAGNOSIS — Z9981 Dependence on supplemental oxygen: Secondary | ICD-10-CM

## 2013-08-10 DIAGNOSIS — K219 Gastro-esophageal reflux disease without esophagitis: Secondary | ICD-10-CM | POA: Diagnosis present

## 2013-08-10 DIAGNOSIS — I129 Hypertensive chronic kidney disease with stage 1 through stage 4 chronic kidney disease, or unspecified chronic kidney disease: Secondary | ICD-10-CM | POA: Diagnosis present

## 2013-08-10 DIAGNOSIS — Z91199 Patient's noncompliance with other medical treatment and regimen due to unspecified reason: Secondary | ICD-10-CM

## 2013-08-10 DIAGNOSIS — Z9119 Patient's noncompliance with other medical treatment and regimen: Secondary | ICD-10-CM

## 2013-08-10 DIAGNOSIS — G4733 Obstructive sleep apnea (adult) (pediatric): Secondary | ICD-10-CM | POA: Diagnosis present

## 2013-08-10 DIAGNOSIS — I252 Old myocardial infarction: Secondary | ICD-10-CM

## 2013-08-10 DIAGNOSIS — Z9861 Coronary angioplasty status: Secondary | ICD-10-CM

## 2013-08-10 DIAGNOSIS — N182 Chronic kidney disease, stage 2 (mild): Secondary | ICD-10-CM

## 2013-08-10 DIAGNOSIS — D649 Anemia, unspecified: Secondary | ICD-10-CM

## 2013-08-10 DIAGNOSIS — I5031 Acute diastolic (congestive) heart failure: Secondary | ICD-10-CM | POA: Diagnosis present

## 2013-08-10 DIAGNOSIS — I2510 Atherosclerotic heart disease of native coronary artery without angina pectoris: Secondary | ICD-10-CM | POA: Diagnosis present

## 2013-08-10 DIAGNOSIS — E785 Hyperlipidemia, unspecified: Secondary | ICD-10-CM | POA: Diagnosis present

## 2013-08-10 DIAGNOSIS — I10 Essential (primary) hypertension: Secondary | ICD-10-CM

## 2013-08-10 DIAGNOSIS — Z87442 Personal history of urinary calculi: Secondary | ICD-10-CM

## 2013-08-10 DIAGNOSIS — I5033 Acute on chronic diastolic (congestive) heart failure: Principal | ICD-10-CM | POA: Diagnosis present

## 2013-08-10 LAB — CBC WITH DIFFERENTIAL/PLATELET
Basophils Absolute: 0 10*3/uL (ref 0.0–0.1)
Basophils Relative: 0 % (ref 0–1)
Eosinophils Absolute: 0.1 10*3/uL (ref 0.0–0.7)
Eosinophils Relative: 1 % (ref 0–5)
HCT: 38.9 % (ref 36.0–46.0)
Hemoglobin: 11.5 g/dL — ABNORMAL LOW (ref 12.0–15.0)
MCH: 24.4 pg — ABNORMAL LOW (ref 26.0–34.0)
MCHC: 29.6 g/dL — ABNORMAL LOW (ref 30.0–36.0)
MCV: 82.4 fL (ref 78.0–100.0)
Monocytes Absolute: 0.7 10*3/uL (ref 0.1–1.0)
Monocytes Relative: 9 % (ref 3–12)
Neutro Abs: 6.3 10*3/uL (ref 1.7–7.7)
RDW: 20.4 % — ABNORMAL HIGH (ref 11.5–15.5)

## 2013-08-10 LAB — URINALYSIS, ROUTINE W REFLEX MICROSCOPIC
Bilirubin Urine: NEGATIVE
Glucose, UA: NEGATIVE mg/dL
Hgb urine dipstick: NEGATIVE
Ketones, ur: NEGATIVE mg/dL
Leukocytes, UA: NEGATIVE
Nitrite: NEGATIVE
Protein, ur: NEGATIVE mg/dL
pH: 6.5 (ref 5.0–8.0)

## 2013-08-10 LAB — BASIC METABOLIC PANEL
BUN: 23 mg/dL (ref 6–23)
CO2: 30 mEq/L (ref 19–32)
Chloride: 100 mEq/L (ref 96–112)
Creatinine, Ser: 0.89 mg/dL (ref 0.50–1.10)
GFR calc Af Amer: 68 mL/min — ABNORMAL LOW (ref >90)
Glucose, Bld: 119 mg/dL — ABNORMAL HIGH (ref 70–99)
Potassium: 3.8 mEq/L (ref 3.5–5.1)

## 2013-08-10 LAB — TROPONIN I: Troponin I: <0.3 ng/mL (ref <0.30)

## 2013-08-10 LAB — PRO B NATRIURETIC PEPTIDE: Pro B Natriuretic peptide (BNP): 378 pg/mL (ref 0–450)

## 2013-08-10 MED ORDER — GABAPENTIN 300 MG PO CAPS
300.0000 mg | ORAL_CAPSULE | Freq: Four times a day (QID) | ORAL | Status: DC
  Administered 2013-08-10 – 2013-08-16 (×23): 300 mg via ORAL
  Filled 2013-08-10 (×23): qty 1

## 2013-08-10 MED ORDER — MONTELUKAST SODIUM 10 MG PO TABS
10.0000 mg | ORAL_TABLET | Freq: Every day | ORAL | Status: DC
  Administered 2013-08-11 – 2013-08-16 (×6): 10 mg via ORAL
  Filled 2013-08-10 (×6): qty 1

## 2013-08-10 MED ORDER — LORATADINE 10 MG PO TABS
10.0000 mg | ORAL_TABLET | Freq: Every day | ORAL | Status: DC
  Administered 2013-08-11 – 2013-08-16 (×6): 10 mg via ORAL
  Filled 2013-08-10 (×6): qty 1

## 2013-08-10 MED ORDER — ACETAMINOPHEN 325 MG PO TABS
650.0000 mg | ORAL_TABLET | ORAL | Status: DC | PRN
  Administered 2013-08-11 – 2013-08-13 (×2): 650 mg via ORAL
  Filled 2013-08-10 (×2): qty 2

## 2013-08-10 MED ORDER — FLUOCINOLONE ACETONIDE BODY 0.01 % EX OIL: 1.0000 "application " | TOPICAL_OIL | Freq: Every day | CUTANEOUS | Status: DC

## 2013-08-10 MED ORDER — SODIUM CHLORIDE 0.9 % IJ SOLN
3.0000 mL | Freq: Two times a day (BID) | INTRAMUSCULAR | Status: DC
  Administered 2013-08-11 – 2013-08-16 (×8): 3 mL via INTRAVENOUS

## 2013-08-10 MED ORDER — SODIUM CHLORIDE 0.9 % IJ SOLN: 3.0000 mL | INTRAMUSCULAR | Status: DC | PRN

## 2013-08-10 MED ORDER — PANTOPRAZOLE SODIUM 40 MG PO TBEC
40.0000 mg | DELAYED_RELEASE_TABLET | Freq: Two times a day (BID) | ORAL | Status: DC
  Administered 2013-08-10 – 2013-08-16 (×12): 40 mg via ORAL
  Filled 2013-08-10 (×12): qty 1

## 2013-08-10 MED ORDER — ASPIRIN EC 81 MG PO TBEC
81.0000 mg | DELAYED_RELEASE_TABLET | Freq: Every day | ORAL | Status: DC
  Administered 2013-08-11 – 2013-08-16 (×6): 81 mg via ORAL
  Filled 2013-08-10 (×6): qty 1

## 2013-08-10 MED ORDER — ASPIRIN 81 MG PO TABS: 81.0000 mg | ORAL_TABLET | Freq: Every day | ORAL | Status: DC

## 2013-08-10 MED ORDER — FUROSEMIDE 10 MG/ML IJ SOLN
80.0000 mg | Freq: Once | INTRAMUSCULAR | Status: AC
  Administered 2013-08-10: 80 mg via INTRAVENOUS
  Filled 2013-08-10: qty 8

## 2013-08-10 MED ORDER — HYDRALAZINE HCL 25 MG PO TABS
25.0000 mg | ORAL_TABLET | Freq: Three times a day (TID) | ORAL | Status: DC
  Administered 2013-08-11 (×3): 25 mg via ORAL
  Filled 2013-08-10 (×4): qty 1

## 2013-08-10 MED ORDER — SODIUM CHLORIDE 0.9 % IV SOLN: 250.0000 mL | INTRAVENOUS | Status: DC | PRN

## 2013-08-10 MED ORDER — NITROGLYCERIN 0.4 MG SL SUBL: 0.4000 mg | SUBLINGUAL_TABLET | SUBLINGUAL | Status: DC | PRN

## 2013-08-10 MED ORDER — INSULIN GLARGINE 100 UNIT/ML ~~LOC~~ SOLN
40.0000 [IU] | Freq: Two times a day (BID) | SUBCUTANEOUS | Status: DC
  Administered 2013-08-11: 40 [IU] via SUBCUTANEOUS
  Filled 2013-08-10 (×4): qty 0.4

## 2013-08-10 MED ORDER — CLONIDINE HCL 0.2 MG/24HR TD PTWK: 0.2000 mg | MEDICATED_PATCH | TRANSDERMAL | Status: DC

## 2013-08-10 MED ORDER — INSULIN ASPART 100 UNIT/ML ~~LOC~~ SOLN
0.0000 [IU] | Freq: Every day | SUBCUTANEOUS | Status: DC
  Administered 2013-08-14: 2 [IU] via SUBCUTANEOUS
  Administered 2013-08-15: 3 [IU] via SUBCUTANEOUS

## 2013-08-10 MED ORDER — POLYETHYLENE GLYCOL 3350 17 G PO PACK
17.0000 g | PACK | Freq: Every day | ORAL | Status: DC | PRN
  Administered 2013-08-15: 17 g via ORAL
  Filled 2013-08-10: qty 1

## 2013-08-10 MED ORDER — ENOXAPARIN SODIUM 40 MG/0.4ML ~~LOC~~ SOLN
40.0000 mg | SUBCUTANEOUS | Status: DC
  Administered 2013-08-10 – 2013-08-15 (×6): 40 mg via SUBCUTANEOUS
  Filled 2013-08-10 (×6): qty 0.4

## 2013-08-10 MED ORDER — FOLIC ACID 1 MG PO TABS
1.0000 mg | ORAL_TABLET | Freq: Every day | ORAL | Status: DC
  Administered 2013-08-11 – 2013-08-16 (×6): 1 mg via ORAL
  Filled 2013-08-10 (×5): qty 1

## 2013-08-10 MED ORDER — ATORVASTATIN CALCIUM 20 MG PO TABS
20.0000 mg | ORAL_TABLET | Freq: Every day | ORAL | Status: DC
  Administered 2013-08-10 – 2013-08-15 (×5): 20 mg via ORAL
  Filled 2013-08-10 (×5): qty 1

## 2013-08-10 MED ORDER — FUROSEMIDE 10 MG/ML IJ SOLN
40.0000 mg | Freq: Two times a day (BID) | INTRAMUSCULAR | Status: DC
  Administered 2013-08-10 – 2013-08-12 (×4): 40 mg via INTRAVENOUS
  Filled 2013-08-10 (×5): qty 4

## 2013-08-10 MED ORDER — ATENOLOL 25 MG PO TABS
75.0000 mg | ORAL_TABLET | Freq: Every day | ORAL | Status: DC
  Administered 2013-08-11 – 2013-08-16 (×6): 75 mg via ORAL
  Filled 2013-08-10 (×6): qty 3

## 2013-08-10 MED ORDER — DOCUSATE SODIUM 100 MG PO CAPS
100.0000 mg | ORAL_CAPSULE | Freq: Two times a day (BID) | ORAL | Status: DC
  Administered 2013-08-10 – 2013-08-16 (×12): 100 mg via ORAL
  Filled 2013-08-10 (×12): qty 1

## 2013-08-10 MED ORDER — ONDANSETRON HCL 4 MG/2ML IJ SOLN: 4.0000 mg | Freq: Four times a day (QID) | INTRAMUSCULAR | Status: DC | PRN

## 2013-08-10 MED ORDER — ISOSORBIDE MONONITRATE ER 60 MG PO TB24
60.0000 mg | ORAL_TABLET | Freq: Every day | ORAL | Status: DC
  Administered 2013-08-11 – 2013-08-14 (×4): 60 mg via ORAL
  Filled 2013-08-10 (×4): qty 1

## 2013-08-10 MED ORDER — POTASSIUM CHLORIDE CRYS ER 20 MEQ PO TBCR
20.0000 meq | EXTENDED_RELEASE_TABLET | Freq: Every day | ORAL | Status: DC
  Administered 2013-08-11 – 2013-08-16 (×6): 20 meq via ORAL
  Filled 2013-08-10 (×6): qty 1

## 2013-08-10 MED ORDER — INSULIN ASPART 100 UNIT/ML ~~LOC~~ SOLN
0.0000 [IU] | Freq: Three times a day (TID) | SUBCUTANEOUS | Status: DC
  Administered 2013-08-11 – 2013-08-12 (×3): 1 [IU] via SUBCUTANEOUS
  Administered 2013-08-12: 5 [IU] via SUBCUTANEOUS
  Administered 2013-08-13: 3 [IU] via SUBCUTANEOUS
  Administered 2013-08-13 – 2013-08-14 (×3): 2 [IU] via SUBCUTANEOUS
  Administered 2013-08-14: 1 [IU] via SUBCUTANEOUS
  Administered 2013-08-14: 3 [IU] via SUBCUTANEOUS
  Administered 2013-08-15: 7 [IU] via SUBCUTANEOUS
  Administered 2013-08-15: 2 [IU] via SUBCUTANEOUS
  Administered 2013-08-15 – 2013-08-16 (×2): 3 [IU] via SUBCUTANEOUS
  Administered 2013-08-16: 5 [IU] via SUBCUTANEOUS
  Administered 2013-08-16: 2 [IU] via SUBCUTANEOUS

## 2013-08-10 NOTE — ED Notes (Signed)
Per patient legs swelling with clear drainage. Patient reports discoloration and tenderness to lower legs bilaterally. Per son patient had syncopal episode yesterday. Per patient syncopal episode was brief. Patient reports falling onto bed and hitting right side of head on wall.

## 2013-08-10 NOTE — H&P (Signed)
History and Physical  Theresa Mclean QMV:[id number] DOB: Nov 19, 1928 DOA: 08/10/2013  Referring physician: C. Bernette Mayers, MD in ED PCP: Ignatius Specking., MD   Chief Complaint: Lower extremity swelling  HPI:  79 year old woman with history of chronic diastolic congestive heart failure presented to the emergency department with several day history of increasing lower extremity edema and shortness of breath. Initial evaluation suggested acute diastolic congestive heart failure and she was referred for admission.  History obtained from patient as well as son at bedside. The patient has been compliant with a no salt diet and with her medications. She did however does not weigh herself even though her son has provided her with a scale. Over the last several days she has had increasing swelling of her bilateral lower extremities with tightness and some pain. She has also had some increased shortness of breath over the last several days. No chest pain.  In the emergency department noted to be afebrile with stable vital signs. Chemistry panel, troponin, BNP, CBC unremarkable. EKG not acute. Chest x-ray suggested pulmonary edema. Urinalysis was unremarkable. CT head no acute findings. 2-D echocardiogram 05/30/2013: Left ventricular ejection fraction 60-65%. Grade 2 diastolic dysfunction.  Review of Systems:  Negative for fever, visual changes, sore throat, new muscle aches, chest pain, dysuria, bleeding, n/v/abdominal pain.  Positive for psoriasis  Past Medical History  Diagnosis Date  . Hypertension   . Nephrolithiasis   . GERD (gastroesophageal reflux disease)   . Arthritis   . Psoriasis   . Morbid obesity   . Mitral regurgitation   . Hyperlipidemia   . Sleep apnea     uses cpap  . CHF (congestive heart failure)     Diastolic. EF 55-65% by cath 10/11/11  . Diabetes mellitus     insulin dependent  . Acute renal insufficiency     09/2011. not on ACEI secondary to this (also has renal artery  stenosis)  . Peripheral vascular disease     severe right renal artery stenosis and left SFA stenosis by PV angio 09/2011, treated medically  . Cellulitis   . CAD (coronary artery disease)     NSTEMI 09/2011 felt poor candidate for CABG, instead s/p PTCA/DES to mid RCA 10/17/11  . Chronic respiratory failure   . Pulmonary hypertension   . Obstructive sleep apnea   . CKD (chronic kidney disease) stage 2, GFR 60-89 ml/min 07/11/2013    Past Surgical History  Procedure Laterality Date  . Acromio-clavicular joint repair  2011  . Abdominal hysterectomy    . Eye surgery      cataract  OD    Social History:  reports that she has never smoked. She has never used smokeless tobacco. She reports that she does not drink alcohol or use illicit drugs.  No Known Allergies  Family History  Problem Relation Age of Onset  . Psoriasis Brother      Prior to Admission medications   Medication Sig Start Date End Date Taking? Authorizing Provider  acitretin (SORIATANE) 10 MG capsule Take 10 mg by mouth daily with supper. 08/01/13  Yes Historical Provider, MD  amLODipine (NORVASC) 10 MG tablet Take 10 mg by mouth daily.   Yes Historical Provider, MD  aspirin 81 MG tablet Take 81 mg by mouth daily.   Yes Historical Provider, MD  atenolol (TENORMIN) 50 MG tablet Take 75 mg by mouth daily. 10/28/11  Yes Clide Deutscher Serpe, PA-C  atorvastatin (LIPITOR) 20 MG tablet Take 20 mg by mouth at bedtime.  Yes Historical Provider, MD  calcium-vitamin D (OSCAL WITH D) 500-200 MG-UNIT per tablet Take 1 tablet by mouth daily with breakfast.   Yes Historical Provider, MD  clobetasol (OLUX) 0.05 % topical foam Apply to scalp daily as needed 04/24/13  Yes Historical Provider, MD  cloNIDine (CATAPRES - DOSED IN MG/24 HR) 0.2 mg/24hr patch Place 1 patch onto the skin once a week.   Yes Historical Provider, MD  docusate sodium (COLACE) 100 MG capsule Take 100 mg by mouth 2 (two) times daily.    Yes Historical Provider, MD   fexofenadine (ALLEGRA) 180 MG tablet Take 180 mg by mouth daily. During allergy seasons.   Yes Historical Provider, MD  FLUOCINOLONE ACETONIDE BODY 0.01 % OIL Apply 1 application topically at bedtime. 08/01/13  Yes Historical Provider, MD  fluocinonide ointment (LIDEX) 0.05 % Apply to affected areas of the body daily as needed. Not to face. 04/24/13 04/24/14 Yes Historical Provider, MD  folic acid (FOLVITE) 1 MG tablet Take 1 mg by mouth daily.   Yes Historical Provider, MD  furosemide (LASIX) 40 MG tablet Take 2 tablets (80 mg total) by mouth daily. (MAY TAKE AN EXTRA 40MG  AS NEEDED FOR INCREASED LEG SWELLING AND/OR SHORTNESS OF BREATH 07/29/13  Yes Laqueta Linden, MD  gabapentin (NEURONTIN) 300 MG capsule Take 300 mg by mouth 4 (four) times daily.    Yes Historical Provider, MD  hydrALAZINE (APRESOLINE) 25 MG tablet Take 25 mg by mouth 3 (three) times daily.   Yes Historical Provider, MD  insulin aspart (NOVOLOG) 100 UNIT/ML injection SLIDING SCALE 0-9 Units, Injected subcutaneous, 3 times daily with meals. CBG < 70: implement hypoglycemia protocol CBG 70 - 120: 0 units CBG 121 - 150: 1 unit CBG 151 - 200: 2 units CBG 201 - 250: 3 units CBG 251 - 300: 5 units CBG 301 - 350: 7 units CBG 351 - 400: 9 units CBG > 400: call MD 10/20/11  Yes Dayna N Dunn, PA-C  insulin glargine (LANTUS) 100 UNIT/ML injection Inject 50-60 Units into the skin 2 (two) times daily.  07/12/13  Yes Elliot Cousin, MD  isosorbide mononitrate (IMDUR) 60 MG 24 hr tablet Take 60 mg by mouth daily.   Yes Historical Provider, MD  methotrexate (RHEUMATREX) 2.5 MG tablet Take 7.5 mg by mouth every Wednesday. Caution:Chemotherapy. Protect from light. Takes on Wednesday.   Yes Historical Provider, MD  montelukast (SINGULAIR) 10 MG tablet Take 10 mg by mouth daily.   Yes Historical Provider, MD  pantoprazole (PROTONIX) 40 MG tablet Take 40 mg by mouth 2 (two) times daily.    Yes Historical Provider, MD  polyethylene glycol  (MIRALAX / GLYCOLAX) packet Take 17 g by mouth daily as needed for moderate constipation. 07/12/13  Yes Elliot Cousin, MD  potassium chloride SA (K-DUR,KLOR-CON) 20 MEQ tablet Take 20 mEq by mouth daily.   Yes Historical Provider, MD  nitroGLYCERIN (NITROSTAT) 0.4 MG SL tablet Place 1 tablet (0.4 mg total) under the tongue every 5 (five) minutes as needed for chest pain. 07/29/13   Laqueta Linden, MD   Physical Exam: Filed Vitals:   08/10/13 1519 08/10/13 1619 08/10/13 1627  BP: 149/52  152/46  Pulse: 61  57  Temp: 98.3 F (36.8 C)    TempSrc: Oral    Resp: 24  22  Height: 5\' 4"  (1.626 m)    Weight: 102.967 kg (227 lb) 102.059 kg (225 lb)   SpO2: 93%      General: Examined in emergency  department. Appears calm and mildly uncomfortable. Nontoxic. Eyes: PERRL, normal lids, irises  ENT: grossly normal hearing, lips & tongue Neck: no LAD, masses or thyromegaly Cardiovascular: RRR, no m/r/g. 2-3+ bilateral lower extremity edema. Respiratory: CTA bilaterally, no w/r/r. Poor air movement. Mild to moderate increased respiratory effort. Able to speak in full sentences. Abdomen: soft, ntnd Skin: Chronic skin changes bilateral lower extremities consistent with psoriasis Musculoskeletal: grossly normal tone BUE/BLE. Grossly normal perfusion bilateral lower extremities with good capillary refill. Psychiatric: grossly normal mood and affect, speech fluent and appropriate Neurologic: grossly non-focal.  Wt Readings from Last 3 Encounters:  08/10/13 102.059 kg (225 lb)  07/12/13 100.154 kg (220 lb 12.8 oz)  01/19/12 98.34 kg (216 lb 12.8 oz)    Labs on Admission:  Basic Metabolic Panel:  Recent Labs Lab 08/10/13 1556  NA 139  K 3.8  CL 100  CO2 30  GLUCOSE 119*  BUN 23  CREATININE 0.89  CALCIUM 10.8*    CBC:  Recent Labs Lab 08/10/13 1556  WBC 8.3  NEUTROABS 6.3  HGB 11.5*  HCT 38.9  MCV 82.4  PLT 211    Cardiac Enzymes:  Recent Labs Lab 08/10/13 1556   TROPONINI <0.30     Recent Labs  07/09/13 1937 08/10/13 1556  PROBNP 390.6 378.0    Radiological Exams on Admission: Dg Chest 2 View  08/10/2013   CLINICAL DATA:  Shortness of breath.  EXAM: CHEST  2 VIEW  COMPARISON:  07/09/2013  FINDINGS: Low lung volumes. Cardiac silhouette is enlarged. There is prominence of interstitial markings, indistinctness of the pulmonary vasculature, and areas of peribronchial cuffing. Increased density is appreciated within the right and left lower lobe regions.  IMPRESSION: Interstitial infiltrate likely representing pulmonary edema. Asymmetric edema versus focal infiltrates within the right and left lung bases left greater than right. Surveillance evaluation recommended.   Electronically Signed   By: Salome Holmes M.D.   On: 08/10/2013 16:56   Ct Head Wo Contrast  08/10/2013   CLINICAL DATA:  Fall.  Head injury.  Weakness.  EXAM: CT HEAD WITHOUT CONTRAST  TECHNIQUE: Contiguous axial images were obtained from the base of the skull through the vertex without intravenous contrast.  COMPARISON:  None.  FINDINGS: There is no evidence of intracranial hemorrhage, brain edema, or other signs of acute infarction. There is no evidence of intracranial mass lesion or mass effect. No abnormal extraaxial fluid collections are identified.  Mild cerebral atrophy is seen as well as moderate cerebellar atrophy. Ventricles are within normal limits in size. No skull fracture identified.  IMPRESSION: No acute intracranial findings.  Cerebral and cerebellar atrophy.   Electronically Signed   By: Myles Rosenthal M.D.   On: 08/10/2013 17:10    EKG: Independently reviewed. Normal sinus rhythm. Lateral T wave changes, consider ischemia. Compared to previous EKG 07/09/2013, no acute changes seen.   Principal Problem:   Acute diastolic congestive heart failure Active Problems:   Diabetes mellitus   Obstructive sleep apnea   Chronic respiratory failure   Assessment/Plan 1. Acute  diastolic congestive heart failure: Reported 5 pound weight gain, chest x-ray suggests pulmonary edema. 2. Reported syncope 2 days prior to admission: Etiology unclear. Review of her record notes a question of low blood pressure. May have been orthostatic. Monitor clinically. 3. Hypertension 4. Diabetes mellitus 5. Morbid obesity 6. Obstructive sleep apnea 7. Chronic respiratory failure 3 L per minute nasal cannula for possible cor pulmonale 8. History of coronary artery disease   IV  diuresis, daily weights, I./O.: Hold Norvasc.  Cardiac enzymes  Sliding scale insulin, continue Lantus.  CPAP each bedtime  Continue current cardiac medications  Discussed in detail above with patient and family members at bedside  Code Status: full code  DVT prophylaxis:Lovenox Family Communication: as above Disposition Plan/Anticipated LOS: admit, 2 days  Time spent: 50 minutes  Brendia Sacks, MD  [facility] Pager [phone] 08/10/2013, 5:42 PM

## 2013-08-10 NOTE — Progress Notes (Signed)
Attempted to place patient on CPAP of 12 cm H2O with 2lpm bled in with a nasal mask;patient complained of discomfort and nurse and I placed her back on her 2Lpm [place] ;Cpap on stand by if patient wishes to try again.

## 2013-08-10 NOTE — ED Notes (Signed)
Inserted 14 f foley per hospitalist.  Using sterile technique.  Pt tolerated well.

## 2013-08-10 NOTE — ED Provider Notes (Signed)
CSN: [id number]     Arrival date & time 08/10/13  1458 History  This chart was scribed for Charles B. Bernette Mayers, MD by Ronal Fear, ED Scribe. This patient was seen in room APA18/APA18 and the patient's care was started at 3:27 PM.      Chief Complaint  Patient presents with  . Loss of Consciousness  . Leg Swelling   (Consider location/radiation/quality/duration/timing/severity/associated sxs/prior Treatment) Patient is a 79 y.o. female presenting with syncope. The history is provided by the patient. No language interpreter was used.  Loss of Consciousness  HPI Comments: Theresa Mclean is a 79 y.o. female who presents to the Emergency Department complaining of leg swelling and draining that blistered up with pain to her right lower leg with drainage to the right leg and swelling to bilateral legs. Her son applied an ointment last night to the affected area with relief. Pt has history of diastolic dysfunction with chronic respiratory failure on PRN oxygen at home. No significant change in breathing, no orthopnea, mild DOE. Reports some weight gain since recent discharge and Cards followup appointment   Pt had an episode of syncope 2 days ago while she was moving from sitting to standing.  She states that she was out for a few seconds, hit her head on wall    Pt denies CP, N/V/D or blood in her stool recently.   Past Medical History  Diagnosis Date  . Hypertension   . Nephrolithiasis   . GERD (gastroesophageal reflux disease)   . Arthritis   . Psoriasis   . Morbid obesity   . Mitral regurgitation   . Hyperlipidemia   . Sleep apnea     uses cpap  . CHF (congestive heart failure)     Diastolic. EF 55-65% by cath 10/11/11  . Diabetes mellitus     insulin dependent  . Acute renal insufficiency     09/2011. not on ACEI secondary to this (also has renal artery stenosis)  . Peripheral vascular disease     severe right renal artery stenosis and left SFA stenosis by PV angio 09/2011, treated  medically  . Cellulitis   . CAD (coronary artery disease)     NSTEMI 09/2011 felt poor candidate for CABG, instead s/p PTCA/DES to mid RCA 10/17/11  . Chronic respiratory failure   . Pulmonary hypertension   . Obstructive sleep apnea   . CKD (chronic kidney disease) stage 2, GFR 60-89 ml/min 07/11/2013   Past Surgical History  Procedure Laterality Date  . Acromio-clavicular joint repair  2011  . Abdominal hysterectomy    . Eye surgery      cataract  OD   History reviewed. No pertinent family history. History  Substance Use Topics  . Smoking status: Never Smoker   . Smokeless tobacco: Never Used  . Alcohol Use: No   OB History   Grav Para Term Preterm Abortions TAB SAB Ect Mult Living                 Review of Systems  Cardiovascular: Positive for syncope.  10 Systems reviewed and are negative for acute change except as noted in the HPI.   Allergies  Review of patient's allergies indicates no known allergies.  Home Medications   Current Outpatient Rx  Name  Route  Sig  Dispense  Refill  . amLODipine (NORVASC) 10 MG tablet   Oral   Take 10 mg by mouth daily.         Marland Kitchen aspirin 81  MG tablet   Oral   Take 81 mg by mouth daily.         Marland Kitchen atenolol (TENORMIN) 50 MG tablet   Oral   Take 75 mg by mouth daily.         Marland Kitchen atorvastatin (LIPITOR) 20 MG tablet   Oral   Take 20 mg by mouth at bedtime.          . calcium-vitamin D (OSCAL WITH D) 500-200 MG-UNIT per tablet   Oral   Take 1 tablet by mouth daily with breakfast.         . clobetasol (OLUX) 0.05 % topical foam      Apply to scalp daily as needed         . cloNIDine (CATAPRES - DOSED IN MG/24 HR) 0.2 mg/24hr patch   Transdermal   Place 1 patch onto the skin once a week.         . docusate sodium (COLACE) 100 MG capsule   Oral   Take 100 mg by mouth 2 (two) times daily.          . fexofenadine (ALLEGRA) 180 MG tablet   Oral   Take 180 mg by mouth daily. During allergy seasons.          . fluocinonide ointment (LIDEX) 0.05 %      Apply to affected areas of the body daily as needed. Not to face.         . folic acid (FOLVITE) 1 MG tablet   Oral   Take 1 mg by mouth daily.         . furosemide (LASIX) 40 MG tablet   Oral   Take 2 tablets (80 mg total) by mouth daily. (MAY TAKE AN EXTRA 40MG  AS NEEDED FOR INCREASED LEG SWELLING AND/OR SHORTNESS OF BREATH         . gabapentin (NEURONTIN) 300 MG capsule   Oral   Take 300 mg by mouth 4 (four) times daily.          . hydrALAZINE (APRESOLINE) 25 MG tablet   Oral   Take 25 mg by mouth 3 (three) times daily.         . insulin aspart (NOVOLOG) 100 UNIT/ML injection      SLIDING SCALE 0-9 Units, Injected subcutaneous, 3 times daily with meals. CBG < 70: implement hypoglycemia protocol CBG 70 - 120: 0 units CBG 121 - 150: 1 unit CBG 151 - 200: 2 units CBG 201 - 250: 3 units CBG 251 - 300: 5 units CBG 301 - 350: 7 units CBG 351 - 400: 9 units CBG > 400: call MD         . insulin glargine (LANTUS) 100 UNIT/ML injection   Subcutaneous   Inject 53-56 Units into the skin 2 (two) times daily.         . isosorbide mononitrate (IMDUR) 60 MG 24 hr tablet   Oral   Take 60 mg by mouth daily.         . methotrexate (RHEUMATREX) 2.5 MG tablet   Oral   Take 7.5 mg by mouth every Wednesday. Caution:Chemotherapy. Protect from light. Takes on Wednesday.         . montelukast (SINGULAIR) 10 MG tablet   Oral   Take 10 mg by mouth daily.         . nitroGLYCERIN (NITROSTAT) 0.4 MG SL tablet   Sublingual   Place 1 tablet (0.4 mg total) under the  tongue every 5 (five) minutes as needed for chest pain.   25 tablet   3   . pantoprazole (PROTONIX) 40 MG tablet   Oral   Take 40 mg by mouth 2 (two) times daily.          . polyethylene glycol (MIRALAX / GLYCOLAX) packet   Oral   Take 17 g by mouth daily as needed for moderate constipation.         . potassium chloride SA (K-DUR,KLOR-CON) 20 MEQ  tablet   Oral   Take 20 mEq by mouth daily.          BP 149/52  Pulse 61  Temp(Src) 98.3 F (36.8 C) (Oral)  Resp 24  Ht 5\' 4"  (1.626 m)  Wt 227 lb (102.967 kg)  BMI 38.95 kg/m2  SpO2 93%  LMP 08/29/1990 Physical Exam  Nursing note and vitals reviewed. Constitutional: She is oriented to person, place, and time. She appears well-developed and well-nourished.  HENT:  Head: Normocephalic and atraumatic.  Eyes: EOM are normal. Pupils are equal, round, and reactive to light.  Neck: Normal range of motion. Neck supple.  Cardiovascular: Normal rate, normal heart sounds and intact distal pulses.   Pulmonary/Chest: Effort normal.  Abdominal: Bowel sounds are normal. She exhibits no distension. There is no tenderness.  Musculoskeletal: Normal range of motion. She exhibits edema. She exhibits no tenderness.  Neurological: She is alert and oriented to person, place, and time. She has normal strength. No cranial nerve deficit or sensory deficit.  Skin: Skin is warm and dry. No rash noted.  Chronic skin changes of PVD, several areas of dried crusted skin on R anterior shin without active drainage or signs of infection, pt also has psoriasis  Psychiatric: She has a normal mood and affect.    ED Course  Procedures (including critical care time) DIAGNOSTIC STUDIES: Oxygen Saturation is 93% on RA, low by my interpretation.    COORDINATION OF CARE:  3:40 PM- Pt advised of plan for treatment including CBC and chest X-ray and pt agrees.  Labs Review Labs Reviewed - No data to display Imaging Review No results found.  EKG Interpretation    Date/Time:  Saturday August 10 2013 15:28:14 EST Ventricular Rate:  60 PR Interval:  162 QRS Duration: 88 QT Interval:  438 QTC Calculation: 438 R Axis:   9 Text Interpretation:  Normal sinus rhythm ST \\T \ T wave abnormality, consider lateral ischemia Abnormal ECG When compared with ECG of 09-Jul-2013 19:29, Premature ventricular complexes are  no longer Present Confirmed by SHELDON  MD, CHARLES ([id number]) on 08/10/2013 3:41:47 PM            MDM   1. CHF (congestive heart failure)    Pt with chronic leg swelling, unclear if this is baseline for her or not. No signs of infection. Will check for evidence of fluid overload. Also patient reports syncope 2 days ago, although this was not her primary concern.   CXR shows some pulm edema. Pt with 5lb weight gain, increasing SOB per family and hypoxia on room air here. Will admit for diuresis.   I personally performed the services described in this documentation, which was scribed in my presence. The recorded information has been reviewed and is accurate.      Charles B. Bernette Mayers, MD 08/10/13 1726

## 2013-08-11 ENCOUNTER — Encounter (HOSPITAL_COMMUNITY): Payer: Self-pay | Admitting: *Deleted

## 2013-08-11 LAB — BASIC METABOLIC PANEL
BUN: 22 mg/dL (ref 6–23)
Calcium: 10.3 mg/dL (ref 8.4–10.5)
GFR calc non Af Amer: 60 mL/min — ABNORMAL LOW (ref >90)
Glucose, Bld: 138 mg/dL — ABNORMAL HIGH (ref 70–99)
Potassium: 3.8 mEq/L (ref 3.5–5.1)
Sodium: 140 mEq/L (ref 135–145)

## 2013-08-11 LAB — GLUCOSE, CAPILLARY
Glucose-Capillary: 145 mg/dL — ABNORMAL HIGH (ref 70–99)
Glucose-Capillary: 191 mg/dL — ABNORMAL HIGH (ref 70–99)

## 2013-08-11 LAB — TROPONIN I
Troponin I: <0.3 ng/mL (ref <0.30)
Troponin I: <0.3 ng/mL (ref <0.30)

## 2013-08-11 MED ORDER — INSULIN GLARGINE 100 UNIT/ML ~~LOC~~ SOLN
40.0000 [IU] | Freq: Every day | SUBCUTANEOUS | Status: DC
  Filled 2013-08-11: qty 0.4

## 2013-08-11 MED ORDER — CLONIDINE HCL 0.2 MG/24HR TD PTWK
0.2000 mg | MEDICATED_PATCH | TRANSDERMAL | Status: DC
  Administered 2013-08-11: 0.2 mg via TRANSDERMAL
  Filled 2013-08-11 (×2): qty 1

## 2013-08-11 NOTE — Plan of Care (Signed)
Call from tele indicating the SPO2 was 82.  Came into room and pt's O2 tubing had come off face.  Replaced oxygen tubing and SP02 increased.

## 2013-08-11 NOTE — Progress Notes (Signed)
[facility] PROGRESS NOTE  Theresa Mclean ZOX:[id number] DOB: 11/12/1928 DOA: 08/10/2013 PCP: Ignatius Specking., MD  Assessment/Plan: 1. Acute diastolic congestive heart failure: Improving. Weight down 1 kg. Chest x-ray suggested pulmonary edema. 2. Report of syncope 2 days prior to admission: Etiology unclear but possibly orthostatic. Monitor clinically. Telemetry today sinus rhythm. 3. Hypertension 4. Diabetes mellitus: Stable. 5. Morbid obesity 6. Obstructive sleep apnea 7. Chronic respiratory failure 3 L per minute nasal cannula, possibly secondary to cor pulmonale per cardiology note 8. History of coronary artery disease   Overall improving. Change to oral diuretics in the morning. Continue daily weights. Norvasc on hold.  Continue sliding scale insulin and Lantus  Continue current cardiac medications  Anticipate discharge within the next 48 hours  Pending studies:   none  Code Status: full code DVT prophylaxis: Lovenox Family Communication:  Disposition Plan: home when improved  Brendia Sacks, MD  [facility]  Pager [phone] If 7PM-7AM, please contact night-coverage at [email or web address], password [facility] 08/11/2013, 11:04 AM  LOS: 1 day   Summary: 79 year old woman with history of chronic diastolic congestive heart failure presented to the emergency department with several day history of increasing lower extremity edema and shortness of breath. Initial evaluation suggested acute diastolic congestive heart failure and she was referred for admission.  Consultants:    Procedures:    Antibiotics:    HPI/Subjective: Still has swelling in her legs. Breathing a bit better.  Objective: Filed Vitals:   08/10/13 1930 08/10/13 1946 08/11/13 0441 08/11/13 0947  BP:  154/48 148/58 142/62  Pulse:  56 59   Temp:  98 F (36.7 C) 98.4 F (36.9 C)   TempSrc:  Oral Oral   Resp:   20   Height:      Weight:   101.6 kg (223 lb 15.8 oz)   SpO2: 95% 99% 93%      Intake/Output Summary (Last 24 hours) at 08/11/13 1104 Last data filed at 08/11/13 0700  Gross per 24 hour  Intake    360 ml  Output    750 ml  Net   -390 ml     Filed Weights   08/10/13 1619 08/10/13 1900 08/11/13 0441  Weight: 102.059 kg (225 lb) 102.6 kg (226 lb 3.1 oz) 101.6 kg (223 lb 15.8 oz)    Exam:   Afebrile, vital signs stable  General: Appears calm and comfortable. Nontoxic.  Cardiovascular: Regular rate and rhythm. No murmur, rub gallop. 2+ bilateral lower extremity edema.  Respiratory: Poor air movement but clear to auscultation. Mild increased respiratory effort. Able to speak in full sentences.  Abdomen: Soft, nontender  Psychiatric: Grossly normal mood and affect. Speech fluent and appropriate.  Data Reviewed:  Weight down 1 kg  -390  Troponins negative  Basic metabolic panel unremarkable. Potassium 3.8.  Scheduled Meds: . aspirin EC  81 mg Oral Daily  . atenolol  75 mg Oral Daily  . atorvastatin  20 mg Oral QHS  . cloNIDine  0.2 mg Transdermal Weekly  . docusate sodium  100 mg Oral BID  . enoxaparin (LOVENOX) injection  40 mg Subcutaneous Q24H  . folic acid  1 mg Oral Daily  . furosemide  40 mg Intravenous Q12H  . gabapentin  300 mg Oral QID  . hydrALAZINE  25 mg Oral TID  . insulin aspart  0-5 Units Subcutaneous QHS  . insulin aspart  0-9 Units Subcutaneous TID WC  . insulin glargine  40 Units Subcutaneous BID  . isosorbide mononitrate  60 mg Oral Daily  . loratadine  10 mg Oral Daily  . montelukast  10 mg Oral Daily  . pantoprazole  40 mg Oral BID  . potassium chloride SA  20 mEq Oral Daily  . sodium chloride  3 mL Intravenous Q12H   Continuous Infusions:   Principal Problem:   Acute diastolic congestive heart failure Active Problems:   Diabetes mellitus   Obstructive sleep apnea   Chronic respiratory failure   Time spent 20 minutes

## 2013-08-11 NOTE — Plan of Care (Signed)
Confirmed with Dr. Irene Limbo, pt BS at 0800 was 91, ok to give Lantus 40 units.

## 2013-08-12 ENCOUNTER — Inpatient Hospital Stay (HOSPITAL_COMMUNITY): Payer: Medicare Other

## 2013-08-12 DIAGNOSIS — I10 Essential (primary) hypertension: Secondary | ICD-10-CM

## 2013-08-12 DIAGNOSIS — E785 Hyperlipidemia, unspecified: Secondary | ICD-10-CM

## 2013-08-12 DIAGNOSIS — G4733 Obstructive sleep apnea (adult) (pediatric): Secondary | ICD-10-CM

## 2013-08-12 DIAGNOSIS — I739 Peripheral vascular disease, unspecified: Secondary | ICD-10-CM

## 2013-08-12 DIAGNOSIS — I2510 Atherosclerotic heart disease of native coronary artery without angina pectoris: Secondary | ICD-10-CM

## 2013-08-12 DIAGNOSIS — D649 Anemia, unspecified: Secondary | ICD-10-CM

## 2013-08-12 DIAGNOSIS — I2789 Other specified pulmonary heart diseases: Secondary | ICD-10-CM

## 2013-08-12 DIAGNOSIS — N182 Chronic kidney disease, stage 2 (mild): Secondary | ICD-10-CM

## 2013-08-12 DIAGNOSIS — I5033 Acute on chronic diastolic (congestive) heart failure: Principal | ICD-10-CM

## 2013-08-12 LAB — BASIC METABOLIC PANEL
BUN: 18 mg/dL (ref 6–23)
CO2: 33 mEq/L — ABNORMAL HIGH (ref 19–32)
Chloride: 103 mEq/L (ref 96–112)
Creatinine, Ser: 0.89 mg/dL (ref 0.50–1.10)
GFR calc Af Amer: 68 mL/min — ABNORMAL LOW (ref >90)
Glucose, Bld: 143 mg/dL — ABNORMAL HIGH (ref 70–99)
Potassium: 3.6 mEq/L (ref 3.5–5.1)

## 2013-08-12 LAB — BLOOD GAS, ARTERIAL
Acid-Base Excess: 9.8 mmol/L — ABNORMAL HIGH (ref 0.0–2.0)
Bicarbonate: 34.9 mEq/L — ABNORMAL HIGH (ref 20.0–24.0)
O2 Saturation: 76.9 %
TCO2: 32.3 mmol/L (ref 0–100)
pCO2 arterial: 58.1 mmHg (ref 35.0–45.0)
pH, Arterial: 7.396 (ref 7.350–7.450)
pO2, Arterial: 45.5 mmHg — ABNORMAL LOW (ref 80.0–100.0)

## 2013-08-12 LAB — GLUCOSE, CAPILLARY
Glucose-Capillary: 131 mg/dL — ABNORMAL HIGH (ref 70–99)
Glucose-Capillary: 115 mg/dL — ABNORMAL HIGH (ref 70–99)
Glucose-Capillary: 179 mg/dL — ABNORMAL HIGH (ref 70–99)

## 2013-08-12 LAB — STREP PNEUMONIAE URINARY ANTIGEN: Strep Pneumo Urinary Antigen: NEGATIVE

## 2013-08-12 LAB — MRSA PCR SCREENING: MRSA by PCR: NEGATIVE

## 2013-08-12 MED ORDER — LORAZEPAM 0.5 MG PO TABS
0.5000 mg | ORAL_TABLET | Freq: Once | ORAL | Status: AC
  Administered 2013-08-12: 0.5 mg via ORAL
  Filled 2013-08-12: qty 1

## 2013-08-12 MED ORDER — HYDRALAZINE HCL 25 MG PO TABS
50.0000 mg | ORAL_TABLET | Freq: Three times a day (TID) | ORAL | Status: DC
  Administered 2013-08-12 – 2013-08-15 (×9): 50 mg via ORAL
  Filled 2013-08-12 (×9): qty 2

## 2013-08-12 MED ORDER — ALBUTEROL SULFATE (5 MG/ML) 0.5% IN NEBU: 2.5000 mg | INHALATION_SOLUTION | Freq: Once | RESPIRATORY_TRACT | Status: AC

## 2013-08-12 MED ORDER — DEXTROSE 5 % IV SOLN
1.0000 g | INTRAVENOUS | Status: DC
  Administered 2013-08-12 – 2013-08-13 (×2): 1 g via INTRAVENOUS
  Filled 2013-08-12 (×3): qty 10

## 2013-08-12 MED ORDER — DEXTROSE 5 % IV SOLN
500.0000 mg | INTRAVENOUS | Status: DC
  Administered 2013-08-12 – 2013-08-13 (×2): 500 mg via INTRAVENOUS
  Filled 2013-08-12 (×3): qty 500

## 2013-08-12 MED ORDER — SPIRONOLACTONE 25 MG PO TABS
12.5000 mg | ORAL_TABLET | Freq: Every day | ORAL | Status: DC
  Administered 2013-08-12 – 2013-08-15 (×4): 12.5 mg via ORAL
  Filled 2013-08-12 (×4): qty 1

## 2013-08-12 MED ORDER — ALBUTEROL SULFATE (5 MG/ML) 0.5% IN NEBU
INHALATION_SOLUTION | RESPIRATORY_TRACT | Status: AC
  Administered 2013-08-12: 2.5 mg
  Filled 2013-08-12: qty 0.5

## 2013-08-12 MED ORDER — INSULIN GLARGINE 100 UNIT/ML ~~LOC~~ SOLN
10.0000 [IU] | Freq: Every day | SUBCUTANEOUS | Status: DC
  Administered 2013-08-12: 10 [IU] via SUBCUTANEOUS
  Filled 2013-08-12: qty 0.1

## 2013-08-12 MED ORDER — LORAZEPAM 2 MG/ML PO CONC: 0.5000 mg | Freq: Once | ORAL | Status: DC

## 2013-08-12 MED ORDER — FUROSEMIDE 10 MG/ML IJ SOLN
40.0000 mg | Freq: Three times a day (TID) | INTRAMUSCULAR | Status: DC
  Administered 2013-08-12 – 2013-08-14 (×6): 40 mg via INTRAVENOUS
  Filled 2013-08-12 (×6): qty 4

## 2013-08-12 NOTE — Progress Notes (Signed)
Notified Dr. Onalee Hua of patient status.  Patient otherwise stable, but still tachypneic. Received orders and carried out per md. Will transfer to ICU, stepdown.  Daughter notified by ICU nurse of patient transfer to ICU bed 1.

## 2013-08-12 NOTE — Progress Notes (Signed)
UR chart review completed.  

## 2013-08-12 NOTE — Consult Note (Signed)
CARDIOLOGY CONSULT NOTE   Patient ID: Theresa Mclean MRN: [id number] DOB/AGE: [birth year] 77 y.o.  Admit Date: 08/10/2013 Referring Physician: PTH Primary Physician: Ignatius Specking., MD Consulting Cardiologist: Purvis Sheffield MD Primary Cardiologist Purvis Sheffield [facility] office) Reason for Consultation: A/C Diastolic CHF  Clinical Summary Theresa Mclean is a 79 y.o.female admitted with pulmonary edema and acute diastolic CHF, with known history of CAD, sp PCI to RCA in 2013 in the setting of NSTEMI, felt to be a poor candidate for CABG due to age and co-morbidities. She was admitted after worsening breathing status, LEE and weight gain. (History is obtained from current and PMH as patient is lethargic and falls asleep easily during assessment). She also complained of syncope 2 days prior.    On admission, BP 149/52, HR 61 bpm, with O2 Sat 93%. CXR demonstrated interstitial infiltrate representing pulmonary edema, asymmetric edema vx focal infiltrates within the right and left lung basses, L>R.   She has been placed on IV lasix 40 mg Q 12 hours, with good diuresis over last 24 hours and wt loss of 6 lbs since admission. Cardiac markers have been negative for ACS.    Other history includes OSA, non-compliant with CPAP, hypertension, DM, PVD RAS on right,and hyperlipidemia. Was seen last by Dr. Purvis Sheffield in the [facility] office 07/29/2013 and was doing well with suboptimal control of hypertension. Home BP's and recordings were requested, with plans to begin hydralazine should BP be continued elevated. Admission in early November 2014 for A/C diastolic CHF decompensation. 79     No Known Allergies  Medications Scheduled Medications: . aspirin EC  81 mg Oral Daily  . atenolol  75 mg Oral Daily  . atorvastatin  20 mg Oral QHS  . cloNIDine  0.2 mg Transdermal Weekly  . docusate sodium  100 mg Oral BID  . enoxaparin (LOVENOX) injection  40 mg Subcutaneous Q24H  . folic acid  1 mg Oral Daily  . furosemide  40 mg  Intravenous Q12H  . gabapentin  300 mg Oral QID  . hydrALAZINE  25 mg Oral TID  . insulin aspart  0-5 Units Subcutaneous QHS  . insulin aspart  0-9 Units Subcutaneous TID WC  . insulin glargine  40 Units Subcutaneous Daily  . isosorbide mononitrate  60 mg Oral Daily  . loratadine  10 mg Oral Daily  . montelukast  10 mg Oral Daily  . pantoprazole  40 mg Oral BID  . potassium chloride SA  20 mEq Oral Daily  . sodium chloride  3 mL Intravenous Q12H        PRN Medications: sodium chloride, acetaminophen, nitroGLYCERIN, ondansetron (ZOFRAN) IV, polyethylene glycol, sodium chloride   Past Medical History  Diagnosis Date  . Hypertension   . Nephrolithiasis   . GERD (gastroesophageal reflux disease)   . Arthritis   . Psoriasis   . Morbid obesity   . Mitral regurgitation   . Hyperlipidemia   . Sleep apnea     uses cpap  . CHF (congestive heart failure)     Diastolic. EF 55-65% by cath 10/11/11  . Diabetes mellitus     insulin dependent  . Acute renal insufficiency     09/2011. not on ACEI secondary to this (also has renal artery stenosis)  . Peripheral vascular disease     severe right renal artery stenosis and left SFA stenosis by PV angio 09/2011, treated medically  . Cellulitis   . CAD (coronary artery disease)     NSTEMI 09/2011 felt poor  candidate for CABG, instead s/p PTCA/DES to mid RCA 10/17/11  . Chronic respiratory failure   . Pulmonary hypertension   . Obstructive sleep apnea   . CKD (chronic kidney disease) stage 2, GFR 60-89 ml/min 07/11/2013    Past Surgical History  Procedure Laterality Date  . Acromio-clavicular joint repair  2011  . Abdominal hysterectomy    . Eye surgery      cataract  OD    Family History  Problem Relation Age of Onset  . Psoriasis Brother     Social History Theresa Mclean reports that she has never smoked. She has never used smokeless tobacco. Theresa Mclean reports that she does not drink alcohol.  Review of Systems Otherwise reviewed  and negative except as outlined.  Physical Examination Blood pressure 153/51, pulse 63, temperature 98.5 F (36.9 C), temperature source Axillary, resp. rate 22, height 5\' 4"  (1.626 m), weight 221 lb 12.5 oz (100.6 kg), last menstrual period 08/29/1990, SpO2 98.00%.  Intake/Output Summary (Last 24 hours) at 08/12/13 0854 Last data filed at 08/12/13 0540  Gross per 24 hour  Intake    480 ml  Output   2100 ml  Net  -1620 ml    Telemetry: Sinus bradycardia 59 bpm.   HEENT: Conjunctiva and lids normal, oropharynx clear with moist mucosa. Neck: Supple, no elevated JVP or carotid bruits, no thyromegaly. Lungs: Clear to auscultation, nonlabored breathing at rest, wearing CPAP with poor inspiratory effort. Cardiac: Regular rate and rhythm, bradycardia Abdomen: Soft, nontender, no hepatomegaly, bowel sounds present, but hypoactive., no guarding or rebound. Extremities: No pitting edema, distal pulses 2+. Skin: Warm and dry. Musculoskeletal: No kyphosis. Neuropsychiatric: Unable to assess falls asleep easily. Follows commands.  Prior Cardiac Testing/Procedures  1. PCI 10/17/2011  PTCA/DES x 1 mid RCA.A 2.75 x 28 mm Promus Element DES   2. Cardiac Cath 10/10/2011 1. Severe two-vessel coronary artery disease with severe stenosis of the right coronary artery and left circumflex  2. Moderate to Severe pulmonary hypertension  3. Normal LV systolic function  4. Severe right renal artery stenosis  5. Severe left superficial femoral artery stenosis  3. 05/30/2013 Echo Left ventricle: Mild posterior wall and moderate basal septal hypertrophy. The cavity size was normal. Systolic function was normal. The estimated ejection fraction was in the range of 60% to 65%. Wall motion was normal; there were no regional wall motion abnormalities. Features are consistent with a pseudonormal left ventricular filling pattern, with concomitant abnormal relaxation and increased filling pressure (grade 2  diastolic dysfunction). Doppler parameters are consistent with high ventricular filling pressure. - Aortic valve: A mild gradient is noted across the LVOT. Poorly visualized. Mildly thickened, mildly calcified leaflets. Mean gradient: 12mm Hg (S). Valve area: 1.73cm^2(VTI). Valve area: 1.61cm^2 (Vmax). - Mitral valve: Calcified annulus. Mildly thickened leaflets . Mild regurgitation. - Left atrium: The atrium was mildly dilated. - Tricuspid valve: Mild regurgitation.  4. NM Lexiscan 07/11/2013 1. Negative lexiscan MPI for ischemia 2. Normal left ventricular systolic function and wall motion 3. Low risk study for major cardiac events.    Lab Results  Basic Metabolic Panel:  Recent Labs Lab 08/10/13 1556 08/11/13 0308 08/12/13 0612  NA 139 140 142  K 3.8 3.8 3.6  CL 100 101 103  CO2 30 31 33*  GLUCOSE 119* 138* 143*  BUN 23 22 18   CREATININE 0.89 0.87 0.89  CALCIUM 10.8* 10.3 10.6*     CBC:  Recent Labs Lab 08/10/13 1556  WBC 8.3  NEUTROABS 6.3  HGB 11.5*  HCT 38.9  MCV 82.4  PLT 211    Cardiac Enzymes:  Recent Labs Lab 08/10/13 1556 08/10/13 2039 08/11/13 0308 08/11/13 0903  TROPONINI <0.30 <0.30 <0.30 <0.30    BNP: No components found with this basename: POCBNP,    Radiology: Dg Chest 2 View  08/10/2013   CLINICAL DATA:  Shortness of breath.  EXAM: CHEST  2 VIEW  COMPARISON:  07/09/2013  FINDINGS: Low lung volumes. Cardiac silhouette is enlarged. There is prominence of interstitial markings, indistinctness of the pulmonary vasculature, and areas of peribronchial cuffing. Increased density is appreciated within the right and left lower lobe regions.  IMPRESSION: Interstitial infiltrate likely representing pulmonary edema. Asymmetric edema versus focal infiltrates within the right and left lung bases left greater than right. Surveillance evaluation recommended.   Electronically Signed   By: Salome Holmes M.D.   On: 08/10/2013 16:56   Ct Head Wo  Contrast  08/10/2013   CLINICAL DATA:  Fall.  Head injury.  Weakness.  EXAM: CT HEAD WITHOUT CONTRAST  TECHNIQUE: Contiguous axial images were obtained from the base of the skull through the vertex without intravenous contrast.  COMPARISON:  None.  FINDINGS: There is no evidence of intracranial hemorrhage, brain edema, or other signs of acute infarction. There is no evidence of intracranial mass lesion or mass effect. No abnormal extraaxial fluid collections are identified.  Mild cerebral atrophy is seen as well as moderate cerebellar atrophy. Ventricles are within normal limits in size. No skull fracture identified.  IMPRESSION: No acute intracranial findings.  Cerebral and cerebellar atrophy.   Electronically Signed   By: Myles Rosenthal M.D.   On: 08/10/2013 17:10   Dg Chest Port 1 View  08/12/2013   CLINICAL DATA:  Shortness of breath, followup evaluation.  EXAM: PORTABLE CHEST - 1 VIEW  COMPARISON:  Chest radiograph August 10, 2013  FINDINGS: The cardiac silhouette remains moderately enlarged, mediastinal silhouette is nonsuspicious. Low inspiratory examination with central engorged, crowded vasculature markings and increasing interstitial prominence. Strandy densities in left lung base. No pleural effusions. No pneumothorax. Multiple EKG lines overlie the patient and may obscure subtle underlying pathology.  IMPRESSION: Cardiomegaly with apparent worsening moderate pulmonary edema, strandy densities in left lung base may reflect early confluent edema or even pneumonia. Recommend followup chest radiograph after treatment to verify improvement.   Electronically Signed   By: Awilda Metro   On: 08/12/2013 07:03     ECG: Normal Sinus Rhythm with mild LVH and ST-T wave abnormalities in the lateral leads.   Impression and Recommendations:  1.Acute Pulmonary Edema: Per records, no admitted medical or dietary non-compliance. She has diuresed approx 2 liter with IV lasix and appears more comfortable  with breathing status. No further evidence of LEE. She remains on CPAP and is very sleepy and lethargic. Review of ABG reviewed pCO2 58.1.Echo demonstrated normal EF and diastolic dysfunction grade 1, LV size was normal. She appear euvolemic on assessment this am.  2. Hypertension: Now only moderately controlled. Better with diureses. Continues on clondidine 0.2 mg patch, hydralazine has been added 25 mg BID, isosorbide 60 mg daily, along with BB. Continue to monitor for medication titration.  3. CAD: S/P PCI to RCA in 2013, with cath demonstrating 2 V disease in the RCA and CX. Repeat NM stress test was normal in November of 2014.Continue ASA, BB. No plavix was given due to hematuria and hemoptysis during Feb 2013 admission.  4. OSA: She has been non-compliant with CPAP at  home according to records. Now on CPAP during this hospitalization.  5.Diabetes: Blood glucose is controlled currently. Management per PTH.      Signed: [name]. Lyman Bishop NP [facility] 08/12/2013, 8:54 AM Co-Sign MD

## 2013-08-12 NOTE — Progress Notes (Signed)
Notified md of patient's tachypnea ( 30-36 times per minute) and continuous grunting while breathing. Patient has no complaints of pain at this time, does not report any perceived respiratory distress, although patient looks uncomfortable and visibly tachypneic.  Received order for one time neb treatment.  Heart rate stable.  Patient oxygen saturation is currently in lower 90s.  Oxygen falls into 70s when patients takes off oxygen.  Dr. Onalee Hua came in and assessed patient and strongly encouraged her to wear cpap.  Patient was very reluctant to wear cpap again tonight, but said she would try it again if she could for a short period of time.  Will close continue to monitor patient.

## 2013-08-12 NOTE — Progress Notes (Signed)
Patient currently on Rocephin & Zithromax for CAP.  Renal function is good at patient's baseline.  Current antibiotics ordered do not require renal dose adjustment.  Doses ordered are appropriate.  Pharmacy will sign off.  Please re-consult as needed. Thank you. Junita Push, PharmD, BCPS

## 2013-08-12 NOTE — Progress Notes (Signed)
[facility] PROGRESS NOTE  Theresa Mclean NUU:[id number] DOB: [birth year] DOA: 08/10/2013 PCP: Ignatius Specking., MD  Assessment/Plan: 1. Acute on chronic respiratory failure with hypoxia: Multifactorial including acute diastolic heart failure, chronic respiratory failure, noncompliance with CPAP likely the major issue here. Possible developing pneumonia although I think this less likely an issue at this point. 2. Acute diastolic congestive heart failure: Worse by chest x-ray, fair diuresis. Does not appear clinically improved. 3. Possible developing community acquired pneumonia, afebrile. 4. Report of syncope 2 days prior to admission: Etiology unclear but possibly orthostatic. Monitor clinically. No further evaluation suggested. 5. Hypertension: Somewhat labile but overall stable. No hypotension. 6. Diabetes mellitus: Capillary blood sugars stable. Continue Lantus. Sliding-scale insulin. 7. Morbid obesity: Nutrition consult 8. Obstructive sleep apnea: CPAP, encourage compliance 9. Chronic respiratory failure 3 L per minute nasal cannula, possibly secondary to cor pulmonale per cardiology note 10. History of coronary artery disease   Transferred to step down unit this morning  Continue CPAP for now, monitor clinically. Reinforce need for CPAP each evening  Monitor respiratory status closely  Cardiology consult, continue aggressive diuresis, management per cardiology  Start empiric antibiotic therapy for community acquired pneumonia  Decrease Lantus until PO intake improves  Dietitian consult  Pending studies:   none  Code Status: full code DVT prophylaxis: Lovenox Family Communication: None present currently Disposition Plan: home when improved  Brendia Sacks, MD  [facility]  Pager [phone] If 7PM-7AM, please contact night-coverage at [email or web address], password [facility] 08/12/2013, 8:26 AM  LOS: 2 days   Summary: 79 year old woman with history of chronic  diastolic congestive heart failure presented to the emergency department with several day history of increasing lower extremity edema and shortness of breath. Initial evaluation suggested acute diastolic congestive heart failure and she was referred for admission.  Consultants:  Cardiology  Procedures:    Antibiotics:    HPI/Subjective: Noted to be Neck with grunting overnight but without visible distress. Patient was reluctant to wear her CPAP. Because of continued tachypnea she was transferred to the ICU were ABG revealed compensated respiratory acidosis. Currently the patient offers no complaints, she is on CPAP  Objective: Filed Vitals:   08/12/13 0214 08/12/13 0500 08/12/13 0538 08/12/13 0812  BP:   176/49   Pulse:   63   Temp:   98.6 F (37 C) 98.5 F (36.9 C)  TempSrc:   Oral Axillary  Resp:   35   Height:      Weight:  100.7 kg (222 lb 0.1 oz) 100.6 kg (221 lb 12.5 oz)   SpO2: 91%  94%     Intake/Output Summary (Last 24 hours) at 08/12/13 0826 Last data filed at 08/12/13 0540  Gross per 24 hour  Intake    480 ml  Output   2100 ml  Net  -1620 ml     Filed Weights   08/11/13 0441 08/12/13 0500 08/12/13 0538  Weight: 101.6 kg (223 lb 15.8 oz) 100.7 kg (222 lb 0.1 oz) 100.6 kg (221 lb 12.5 oz)    Exam:   Afebrile, vital signs stable. Overall hypoxia appears stable.  General: Appears calm and comfortable on CPAP. Briefly arouses and follows commands. Nontoxic but appears ill.  Eyes: Pupils round, equal, grossly normal. Lids and irises appear unremarkable.  ENT lips appear unremarkable. On CPAP.  Cardiovascular: Bradycardia, regular rhythm. No murmur, rub or gallop. Bilateral lower extremity edema noted without significant change.  Respiratory effort diminished breath sounds, poor air movement, no frank wheezes rales  or rhonchi. Mild increased respiratory effort.  Abdomen: Soft, nontender, nondistended  Skin: Appears grossly unremarkable  Data  Reviewed:  Weight down 2 kg from admission  -1620 L  ABG pH 7.396, PCO2 50, PO2 45  Chemistry panel notable for CO2 33. Normal BUN and creatinine.  Portable chest x-ray: Independently reviewed. Worsening pulmonary edema, cannot exclude left infiltrate at base. I concur with radiology interpretation.  Scheduled Meds: . aspirin EC  81 mg Oral Daily  . atenolol  75 mg Oral Daily  . atorvastatin  20 mg Oral QHS  . cloNIDine  0.2 mg Transdermal Weekly  . docusate sodium  100 mg Oral BID  . enoxaparin (LOVENOX) injection  40 mg Subcutaneous Q24H  . folic acid  1 mg Oral Daily  . furosemide  40 mg Intravenous Q12H  . gabapentin  300 mg Oral QID  . hydrALAZINE  25 mg Oral TID  . insulin aspart  0-5 Units Subcutaneous QHS  . insulin aspart  0-9 Units Subcutaneous TID WC  . insulin glargine  40 Units Subcutaneous Daily  . isosorbide mononitrate  60 mg Oral Daily  . loratadine  10 mg Oral Daily  . montelukast  10 mg Oral Daily  . pantoprazole  40 mg Oral BID  . potassium chloride SA  20 mEq Oral Daily  . sodium chloride  3 mL Intravenous Q12H   Continuous Infusions:   Principal Problem:   Acute diastolic congestive heart failure Active Problems:   Diabetes mellitus   Obstructive sleep apnea   Chronic respiratory failure   Time spent 30 minutes

## 2013-08-12 NOTE — Care Management Note (Addendum)
    Page 1 of 2   08/16/2013     3:28:24 PM   CARE MANAGEMENT NOTE 08/16/2013  Patient:  Mclean, Theresa   Account Number:  [id number]  Date Initiated:  08/12/2013  Documentation initiated by:  Sharrie Rothman  Subjective/Objective Assessment:   Pt admitted from home with CHF. Pt lives with her son and will return home at discharge. Pt has a daughter and granddaughter who help out with pts bathing when they can. Pt has a walker, home O2, cpap for home use.     Action/Plan:   Will continue to follow for discharge planning needs. Pt would benefit from [facility] at discharge. Will agency out of [place].   Anticipated DC Date:  08/19/2013   Anticipated DC Plan:  HOME W HOME HEALTH SERVICES      DC Planning Services  CM consult      [facility] Choice  Resumption Of Svcs/PTA Provider   Choice offered to / List presented to:          [facility] arranged  HH-1 RN  HH-2 PT  HH-4 NURSE'S AIDE      HH agency  [facility]   Status of service:  Completed, signed off Medicare Important Message given?   (If response is "NO", the following Medicare IM given date fields will be blank) Date Medicare IM given:   Date Additional Medicare IM given:    Discharge Disposition:  HOME W HOME HEALTH SERVICES  Per UR Regulation:    If discussed at Long Length of Stay Meetings, dates discussed:   08/15/2013    Comments:  08/16/13 1520 Arlyss Queen, RN BSN CM Pt discharged home today with resumption of [facility]. Discharge summary sent to [facility] per request. Hh services to start within 48 hous of discharge. Pt, pts son, and pts nurse aware of discharge arrangements.  08/15/13 Rosemary Holms RN BSN CM Pt wishes to resume [facility] services with [facility]. Need to be faxed DC summary with indication to resume [facility] services  08/12/13 1450 Arlyss Queen, Charity fundraiser BSN CM

## 2013-08-12 NOTE — Progress Notes (Signed)
Patient wore cpap for approximately 45 minutes.  Have explained to patient several times during night that cpap is to benefit her breathing.  Explained to patient that intubation could be a possibility if her condition deteriorates.  Patient completely alert and oriented and verbalized understanding. Respiratory therapy and rn have encouraged patient to wear cpap during night.

## 2013-08-12 NOTE — Progress Notes (Signed)
PT IN ICU. REFUSING HOSPITAL BIPAP. PT'S FAMILY CALLED AND BROUGHT PT'S CPAP FROM HOME WHICH PT IS NOW WEARING. SON AT BEDSIDE. DR GOODRICH AWARE OF ABG RESULTS AND THAT O2 SATS ARE IMPROVED SINCE STARTING CPAP.

## 2013-08-12 NOTE — Consult Note (Signed)
The patient was seen and examined, and I agree with the assessment and plan as documented above, with modifications as noted below.  Patient known to me from clinic. Her CXR shows worsening pulmonary edema (moderate). Her SBP was 166 mmHg during my exam. She has been noncompliant with CPAP. She has grade II diastolic dysfunction as well as severe left circumflex disease (serial 90-95% mid LCx lesions by cath on 10-11-2011). However, recent nuclear myocardial perfusion study showed no evidence of ischemia. Amlodipine was held on admission. I will increase Lasix frequency to 40 mg IV tid, increase hydralazine to 50 mg tid for more optimal BP control, and will also add low-dose spironolactone.

## 2013-08-13 DIAGNOSIS — E119 Type 2 diabetes mellitus without complications: Secondary | ICD-10-CM

## 2013-08-13 LAB — BASIC METABOLIC PANEL
CO2: 30 mEq/L (ref 19–32)
Calcium: 10.3 mg/dL (ref 8.4–10.5)
Chloride: 100 mEq/L (ref 96–112)
Creatinine, Ser: 0.99 mg/dL (ref 0.50–1.10)
Glucose, Bld: 155 mg/dL — ABNORMAL HIGH (ref 70–99)
Sodium: 139 mEq/L (ref 135–145)

## 2013-08-13 LAB — LEGIONELLA ANTIGEN, URINE: Legionella Antigen, Urine: NEGATIVE

## 2013-08-13 LAB — CBC
HCT: 36.6 % (ref 36.0–46.0)
Hemoglobin: 11 g/dL — ABNORMAL LOW (ref 12.0–15.0)
MCH: 24.9 pg — ABNORMAL LOW (ref 26.0–34.0)
MCV: 82.8 fL (ref 78.0–100.0)
Platelets: ADEQUATE 10*3/uL (ref 150–400)
RBC: 4.42 MIL/uL (ref 3.87–5.11)
RDW: 20 % — ABNORMAL HIGH (ref 11.5–15.5)
WBC: 9.1 10*3/uL (ref 4.0–10.5)

## 2013-08-13 LAB — GLUCOSE, CAPILLARY
Glucose-Capillary: 154 mg/dL — ABNORMAL HIGH (ref 70–99)
Glucose-Capillary: 282 mg/dL — ABNORMAL HIGH (ref 70–99)

## 2013-08-13 MED ORDER — INSULIN GLARGINE 100 UNIT/ML ~~LOC~~ SOLN
15.0000 [IU] | SUBCUTANEOUS | Status: AC
  Administered 2013-08-13: 15 [IU] via SUBCUTANEOUS
  Filled 2013-08-13: qty 0.15

## 2013-08-13 MED ORDER — INSULIN GLARGINE 100 UNIT/ML ~~LOC~~ SOLN
15.0000 [IU] | Freq: Every day | SUBCUTANEOUS | Status: DC
  Administered 2013-08-14 – 2013-08-16 (×3): 15 [IU] via SUBCUTANEOUS
  Filled 2013-08-13 (×8): qty 0.15

## 2013-08-13 MED ORDER — AZITHROMYCIN 250 MG PO TABS
500.0000 mg | ORAL_TABLET | Freq: Every day | ORAL | Status: DC
  Administered 2013-08-14 – 2013-08-16 (×3): 500 mg via ORAL
  Filled 2013-08-13 (×3): qty 2

## 2013-08-13 NOTE — Progress Notes (Signed)
PHARMACIST - PHYSICIAN COMMUNICATION DR:   Goodrich CONCERNING: Antibiotic IV to Oral Route Change Policy  RECOMMENDATION: This patient is receiving Zithromax by the intravenous route.  Based on criteria approved by the Pharmacy and Therapeutics Committee, the antibiotic(s) is/are being converted to the equivalent oral dose form(s).  DESCRIPTION: These criteria include:  Patient being treated for a respiratory tract infection, urinary tract infection, cellulitis or clostridium difficile associated diarrhea if on metronidazole  The patient is not neutropenic and does not exhibit a GI malabsorption state  The patient is eating (either orally or via tube) and/or has been taking other orally administered medications for a least 24 hours  The patient is improving clinically and has a Tmax < 100.5  If you have questions about this conversion, please contact the Pharmacy Department  [x]  ( [phone] )  [place] []  ( [phone] )  [place]  []  ( [phone] )  [facility] []  ( [phone] )  [facility]    S. Yitta Gongaware, PharmD 

## 2013-08-13 NOTE — Evaluation (Signed)
Physical Therapy Evaluation Patient Details Name: Theresa Mclean MRN: [id number] DOB: Sep 07, 1928 Today's Date: 08/13/2013 Time: 2130-8657 PT Time Calculation (min): 40 min  PT Assessment / Plan / Recommendation    History of Present Illness  79 year old woman with history of chronic diastolic congestive heart failure presented to the emergency department with several day history of increasing lower extremity edema and shortness of breath. Initial evaluation suggested acute diastolic congestive heart failure and she was referred for evaluation..  Clinical Impression  Pt demonstrates decreased LE strength, decreased mobility and will benefit from skilled therapy to progress pt to previous functional level.    PT Assessment  Patient needs continued PT services    Follow Up Recommendations  Home health PT    Does the patient have the potential to tolerate intense rehabilitation    no  Barriers to Discharge  none      Equipment Recommendations  None recommended by PT    Recommendations for Other Services   none  Frequency Min 3X/week    Precautions / Restrictions Precautions Precautions: Fall   Pertinent Vitals/Pain None noted      Mobility  Bed Mobility Bed Mobility: Supine to Sit;Sitting - Scoot to Edge of Bed Supine to Sit: 4: Min assist Sitting - Scoot to Edge of Bed: 6: Modified independent (Device/Increase time) Transfers Transfers: Sit to [employer] Sit to Stand: 3: Mod assist Stand Pivot Transfers: 5: Supervision Details for Transfer Assistance: slow but pt able to complete task on own. Ambulation/Gait Ambulation/Gait Assistance: Not tested (comment)    Exercises General Exercises - Lower Extremity Ankle Circles/Pumps: AROM;Both;10 reps Quad Sets: AROM;Both;10 reps Gluteal Sets: AROM;Both;10 reps Heel Slides: AROM;Both;10 reps Hip ABduction/ADduction: AROM;Both;10 reps   PT Diagnosis: Difficulty walking;Generalized weakness  PT Problem  List: Decreased strength;Decreased activity tolerance;Decreased balance PT Treatment Interventions: Gait training;Functional mobility training;Therapeutic activities;Therapeutic exercise     PT Goals(Current goals can be found in the care plan section)    Visit Information  Last PT Received On: 08/13/13 History of Present Illness: 79 year old woman with history of chronic diastolic congestive heart failure presented to the emergency department with several day history of increasing lower extremity edema and shortness of breath. Initial evaluation suggested acute diastolic congestive heart failure and she was referred for admission.       Prior Functioning  Home Living Family/patient expects to be discharged to:: Private residence Living Arrangements: Children Available Help at Discharge: Family Type of Home: House Home Access: Level entry Home Equipment: Walker - standard;Shower seat Prior Function Level of Independence: Needs assistance Gait / Transfers Assistance Needed: ambulates short distances with walker independently, transfers independently ADL's / Homemaking Assistance Needed: assist with all ADLs Comments: Pt son does cooking, cleaning, daughter assists with bathing.  Pt needs min assist with bed mobility at home.  Ambulates short distances with supervision with walker. Communication Communication: No difficulties    Cognition  Cognition Arousal/Alertness: Awake/alert Behavior During Therapy: WFL for tasks assessed/performed Overall Cognitive Status: Within Functional Limits for tasks assessed    Extremity/Trunk Assessment Lower Extremity Assessment Lower Extremity Assessment: Generalized weakness   Balance    End of Session PT - End of Session Equipment Utilized During Treatment: Gait belt Activity Tolerance: Patient tolerated treatment well Patient left: in chair;with call bell/phone within reach;with family/visitor present  GP     Theresa Mclean 08/13/2013,  12:28 PM

## 2013-08-13 NOTE — Progress Notes (Signed)
[facility] PROGRESS NOTE  Theresa Mclean ION:[id number] DOB: [birth year] DOA: 08/10/2013 PCP: Ignatius Specking., MD  Assessment/Plan: 1. Acute on chronic respiratory failure with hypoxia: Much improved. Multifactorial including acute diastolic heart failure, chronic respiratory failure, noncompliance with CPAP. Possible developing pneumonia. 2. Acute diastolic congestive heart failure: Continues to diurese well and lose weight. Continue management per cardiology. Much improved. 3. Possible developing community acquired pneumonia, afebrile. Stable. 4. Report of syncope 2 days prior to admission: Etiology unclear but possibly orthostatic. Monitor clinically. No further evaluation suggested. 5. Hypertension: Improved with adjustments per cardiology. 6. Diabetes mellitus: Capillary blood sugars stable. Continue Lantus. Sliding-scale insulin. 7. Morbid obesity: Nutrition consult 8. Obstructive sleep apnea: Continue CPAP 9. Chronic respiratory failure 3 L per minute nasal cannula, possibly secondary to cor pulmonale per cardiology note 10. History of coronary artery disease   Continue diuretics, cardiac medications per cardiology.   Basic metabolic panel in the morning, monitor urine output.  Continue empiric antibiotics.  Consult physical therapy  Nutrition consult  Transfer to telemetry  Overall improving  Pending studies:   none  Code Status: full code DVT prophylaxis: Lovenox Family Communication: None present currently Disposition Plan: home when improved  Brendia Sacks, MD  [facility]  Pager [phone] If 7PM-7AM, please contact night-coverage at [email or web address], password [facility] 08/13/2013, 7:42 AM  LOS: 3 days   Summary: 79 year old woman with history of chronic diastolic congestive heart failure presented to the emergency department with several day history of increasing lower extremity edema and shortness of breath. Initial evaluation suggested acute  diastolic congestive heart failure and she was referred for admission.  Consultants:  Cardiology  Procedures:    Antibiotics:    HPI/Subjective: Cardiology adjusted Lasix yesterday, increased hydralazine and add low-dose spironolactone. No issues overnight per RN. Patient tolerated CPAP well. She feels well this morning no nausea, vomiting or abdominal pain. Seems to be breathing better.  Objective: Filed Vitals:   08/13/13 0400 08/13/13 0424 08/13/13 0500 08/13/13 0600  BP: 146/38  144/41 147/39  Pulse:      Temp: 98.6 F (37 C)     TempSrc: Axillary     Resp: 30  19 33  Height:      Weight:  99.2 kg (218 lb 11.1 oz)    SpO2: 95%       Intake/Output Summary (Last 24 hours) at 08/13/13 0742 Last data filed at 08/13/13 0500  Gross per 24 hour  Intake   1035 ml  Output   2251 ml  Net  -1216 ml     Filed Weights   08/12/13 0500 08/12/13 0538 08/13/13 0424  Weight: 100.7 kg (222 lb 0.1 oz) 100.6 kg (221 lb 12.5 oz) 99.2 kg (218 lb 11.1 oz)    Exam:   Afebrile, vital signs stable. Hypoxia appears to be stable. Tachypneic.  General: Appears calm and comfortable. Nontoxic. Overall much better today.  Cardiovascular: Regular rate and rhythm. No murmur, rub gallop. Decreased lower journey edema, 1-2+ bilaterally  Telemetry: Sinus rhythm  Respiratory: Clear to auscultation bilaterally, no frank wheezes, rales or rhonchi. Mild increased respiratory effort.  Abdomen: Soft, nontender, nondistended  Skin: Appears grossly unremarkable  Musculoskeletal: Moves all extremities. Excellent capillary refill bilateral lower choice, warm and dry feet.  Psychiatric: Grossly normal mood and affect. Speech fluent and appropriate.  Data Reviewed:  Weight down 3 kg from admission  -3226 since admission  Basic metabolic panel unremarkable  CBC unremarkable  Scheduled Meds: . aspirin EC  81 mg Oral  Daily  . atenolol  75 mg Oral Daily  . atorvastatin  20 mg Oral QHS   . azithromycin  500 mg Intravenous Q24H  . cefTRIAXone (ROCEPHIN)  IV  1 g Intravenous Q24H  . cloNIDine  0.2 mg Transdermal Weekly  . docusate sodium  100 mg Oral BID  . enoxaparin (LOVENOX) injection  40 mg Subcutaneous Q24H  . folic acid  1 mg Oral Daily  . furosemide  40 mg Intravenous Q8H  . gabapentin  300 mg Oral QID  . hydrALAZINE  50 mg Oral TID  . insulin aspart  0-5 Units Subcutaneous QHS  . insulin aspart  0-9 Units Subcutaneous TID WC  . insulin glargine  10 Units Subcutaneous Daily  . isosorbide mononitrate  60 mg Oral Daily  . loratadine  10 mg Oral Daily  . montelukast  10 mg Oral Daily  . pantoprazole  40 mg Oral BID  . potassium chloride SA  20 mEq Oral Daily  . sodium chloride  3 mL Intravenous Q12H  . spironolactone  12.5 mg Oral Daily   Continuous Infusions:   Principal Problem:   Acute diastolic congestive heart failure Active Problems:   Diabetes mellitus   Obstructive sleep apnea   Chronic respiratory failure   Time spent 20 minutes

## 2013-08-13 NOTE — Progress Notes (Signed)
INITIAL NUTRITION ASSESSMENT  DOCUMENTATION CODES Per approved criteria  -Obesity Unspecified   INTERVENTION: Provided diet education related to CHF and general healthful diet guidelines   NUTRITION DIAGNOSIS: Knowledge deficit related to CHF AEB diet hx.  Goal: The pt will be compliant with Heart Healthy diet   Monitor:  Po intake, labs and wt trends  Reason for Assessment: Evaluate nutrition status/requirements   79 y.o. female  Admitting Dx: Acute diastolic congestive heart failure  ASSESSMENT: Obese 79 yo FM with acute respiratory failure, CHF. Chronic O2 @ 3L/m. Diet hx reviewed. Son lives with pt and is the one who shops and prepares food. Pt denies adding salt to food and doesn't eat out. Alternates breakfast daily with choices which include: oatmeal, eggs with bacon, rice with orange juice or tea for beverage. Lunch usually consist of fruit and then vegetables for dinner. Provided Heart healthy handout and reviewed basics of diet recommendations and included contact if son has questions.   Height: Ht Readings from Last 1 Encounters:  08/10/13 5\' 4"  (1.626 m)    Weight: Wt Readings from Last 1 Encounters:  08/13/13 218 lb 11.1 oz (99.2 kg)    Ideal Body Weight: 120# (54.5 kg)  % Ideal Body Weight: 182%  Wt Readings from Last 10 Encounters:  08/13/13 218 lb 11.1 oz (99.2 kg)  07/12/13 220 lb 12.8 oz (100.154 kg)  01/19/12 216 lb 12.8 oz (98.34 kg)  10/28/11 225 lb (102.059 kg)  10/20/11 223 lb 1.7 oz (101.2 kg)  10/20/11 223 lb 1.7 oz (101.2 kg)  10/20/11 223 lb 1.7 oz (101.2 kg)  10/20/11 223 lb 1.7 oz (101.2 kg)    Usual Body Weight: 217-223#  % Usual Body Weight: 100%  BMI:  Body mass index is 37.52 kg/(m^2).obesity class II  Estimated Nutritional Needs: Kcal:  1300-1540 Protein: 80-90 gr Fluid: per MD goals  Skin: intact  Diet Order: Cardiac po's 75%  EDUCATION NEEDS: -Education needs addressed   Intake/Output Summary (Last 24 hours) at  08/13/13 1129 Last data filed at 08/13/13 1000  Gross per 24 hour  Intake   1335 ml  Output   2251 ml  Net   -916 ml    Last BM: 08/12/13 loose   Labs:   Recent Labs Lab 08/11/13 0308 08/12/13 0612 08/13/13 0415  NA 140 142 139  K 3.8 3.6 4.2  CL 101 103 100  CO2 31 33* 30  BUN 22 18 21   CREATININE 0.87 0.89 0.99  CALCIUM 10.3 10.6* 10.3  GLUCOSE 138* 143* 155*    CBG (last 3)   Recent Labs  08/12/13 1623 08/12/13 2107 08/13/13 0717  GLUCAP 264* 179* 154*    Scheduled Meds: . aspirin EC  81 mg Oral Daily  . atenolol  75 mg Oral Daily  . atorvastatin  20 mg Oral QHS  . azithromycin  500 mg Intravenous Q24H  . cefTRIAXone (ROCEPHIN)  IV  1 g Intravenous Q24H  . cloNIDine  0.2 mg Transdermal Weekly  . docusate sodium  100 mg Oral BID  . enoxaparin (LOVENOX) injection  40 mg Subcutaneous Q24H  . folic acid  1 mg Oral Daily  . furosemide  40 mg Intravenous Q8H  . gabapentin  300 mg Oral QID  . hydrALAZINE  50 mg Oral TID  . insulin aspart  0-5 Units Subcutaneous QHS  . insulin aspart  0-9 Units Subcutaneous TID WC  . insulin glargine  15 Units Subcutaneous Daily  . isosorbide mononitrate  60 mg Oral Daily  . loratadine  10 mg Oral Daily  . montelukast  10 mg Oral Daily  . pantoprazole  40 mg Oral BID  . potassium chloride SA  20 mEq Oral Daily  . sodium chloride  3 mL Intravenous Q12H  . spironolactone  12.5 mg Oral Daily    Continuous Infusions:   Past Medical History  Diagnosis Date  . Hypertension   . Nephrolithiasis   . GERD (gastroesophageal reflux disease)   . Arthritis   . Psoriasis   . Morbid obesity   . Mitral regurgitation   . Hyperlipidemia   . Sleep apnea     uses cpap  . CHF (congestive heart failure)     Diastolic. EF 55-65% by cath 10/11/11  . Diabetes mellitus     insulin dependent  . Acute renal insufficiency     09/2011. not on ACEI secondary to this (also has renal artery stenosis)  . Peripheral vascular disease      severe right renal artery stenosis and left SFA stenosis by PV angio 09/2011, treated medically  . Cellulitis   . CAD (coronary artery disease)     NSTEMI 09/2011 felt poor candidate for CABG, instead s/p PTCA/DES to mid RCA 10/17/11  . Chronic respiratory failure   . Pulmonary hypertension   . Obstructive sleep apnea   . CKD (chronic kidney disease) stage 2, GFR 60-89 ml/min 07/11/2013    Past Surgical History  Procedure Laterality Date  . Acromio-clavicular joint repair  2011  . Abdominal hysterectomy    . Eye surgery      cataract  OD    Royann Shivers MS,RD,CSG,LDN Office: #[phone] Pager: [phone]

## 2013-08-13 NOTE — Progress Notes (Signed)
SUBJECTIVE: Pt feels much better, and her breathing has improved considerably. She says that her left leg stocking is too tight and causing her pain.     Intake/Output Summary (Last 24 hours) at 08/13/13 0913 Last data filed at 08/13/13 0500  Gross per 24 hour  Intake    975 ml  Output   2251 ml  Net  -1276 ml    Current Facility-Administered Medications  Medication Dose Route Frequency Provider Last Rate Last Dose  . 0.9 %  sodium chloride infusion  250 mL Intravenous PRN Standley Brooking, MD      . acetaminophen (TYLENOL) tablet 650 mg  650 mg Oral Q4H PRN Standley Brooking, MD   650 mg at 08/11/13 [phone]  . aspirin EC tablet 81 mg  81 mg Oral Daily Standley Brooking, MD   81 mg at 08/12/13 1209  . atenolol (TENORMIN) tablet 75 mg  75 mg Oral Daily Standley Brooking, MD   75 mg at 08/12/13 1207  . atorvastatin (LIPITOR) tablet 20 mg  20 mg Oral QHS Standley Brooking, MD   20 mg at 08/12/13 2146  . azithromycin (ZITHROMAX) 500 mg in dextrose 5 % 250 mL IVPB  500 mg Intravenous Q24H Standley Brooking, MD   500 mg at 08/12/13 1149  . cefTRIAXone (ROCEPHIN) 1 g in dextrose 5 % 50 mL IVPB  1 g Intravenous Q24H Standley Brooking, MD   1 g at 08/12/13 1149  . cloNIDine (CATAPRES - Dosed in mg/24 hr) patch 0.2 mg  0.2 mg Transdermal Weekly Standley Brooking, MD   0.2 mg at 08/11/13 [phone]  . docusate sodium (COLACE) capsule 100 mg  100 mg Oral BID Standley Brooking, MD   100 mg at 08/12/13 2147  . enoxaparin (LOVENOX) injection 40 mg  40 mg Subcutaneous Q24H Standley Brooking, MD   40 mg at 08/12/13 2002  . folic acid (FOLVITE) tablet 1 mg  1 mg Oral Daily Standley Brooking, MD   1 mg at 08/12/13 1000  . furosemide (LASIX) injection 40 mg  40 mg Intravenous Q8H Laqueta Linden, MD   40 mg at 08/13/13 0524  . gabapentin (NEURONTIN) capsule 300 mg  300 mg Oral QID Standley Brooking, MD   300 mg at 08/12/13 2146  . hydrALAZINE (APRESOLINE) tablet 50 mg  50 mg Oral TID Laqueta Linden, MD   50 mg at 08/12/13 2150  . insulin aspart (novoLOG) injection 0-5 Units  0-5 Units Subcutaneous QHS Standley Brooking, MD      . insulin aspart (novoLOG) injection 0-9 Units  0-9 Units Subcutaneous TID WC Standley Brooking, MD   2 Units at 08/13/13 [phone]  . insulin glargine (LANTUS) injection 15 Units  15 Units Subcutaneous Daily Standley Brooking, MD      . isosorbide mononitrate (IMDUR) 24 hr tablet 60 mg  60 mg Oral Daily Standley Brooking, MD   60 mg at 08/12/13 1208  . loratadine (CLARITIN) tablet 10 mg  10 mg Oral Daily Standley Brooking, MD   10 mg at 08/12/13 1208  . montelukast (SINGULAIR) tablet 10 mg  10 mg Oral Daily Standley Brooking, MD   10 mg at 08/12/13 1208  . nitroGLYCERIN (NITROSTAT) SL tablet 0.4 mg  0.4 mg Sublingual Q5 min PRN Standley Brooking, MD      . ondansetron [facility]) injection 4 mg  4 mg  Intravenous Q6H PRN Standley Brooking, MD      . pantoprazole (PROTONIX) EC tablet 40 mg  40 mg Oral BID Standley Brooking, MD   40 mg at 08/12/13 2145  . polyethylene glycol (MIRALAX / GLYCOLAX) packet 17 g  17 g Oral Daily PRN Standley Brooking, MD      . potassium chloride SA (K-DUR,KLOR-CON) CR tablet 20 mEq  20 mEq Oral Daily Standley Brooking, MD   20 mEq at 08/12/13 1208  . sodium chloride 0.9 % injection 3 mL  3 mL Intravenous Q12H Standley Brooking, MD   3 mL at 08/12/13 2143  . sodium chloride 0.9 % injection 3 mL  3 mL Intravenous PRN Standley Brooking, MD      . spironolactone (ALDACTONE) tablet 12.5 mg  12.5 mg Oral Daily Laqueta Linden, MD   12.5 mg at 08/12/13 1208    Filed Vitals:   08/13/13 0600 08/13/13 0630 08/13/13 0700 08/13/13 0730  BP: 147/39 148/43 152/41 153/40  Pulse:      Temp:      TempSrc:      Resp: 33 26 27 29   Height:      Weight:      SpO2:        PHYSICAL EXAM General: NAD Neck: No JVD, no thyromegaly or thyroid nodule.  Lungs: CTAB with normal respiratory effort. CV: Nondisplaced PMI.  Regular rhythm, normal S1/S2,  no S3/S4, I/VI holosystolic murmur along left sternal border.  Trace pretibial edema with compression stockings.  No carotid bruit.  Normal pedal pulses.  Abdomen: Soft, nontender, no hepatosplenomegaly, no distention.  Neurologic: Alert and oriented x 3.  Psych: Normal affect. Extremities: No clubbing or cyanosis.   TELEMETRY: Reviewed telemetry pt in NSR.  LABS: Basic Metabolic Panel:  Recent Labs  40/98/11 0612 08/13/13 0415  NA 142 139  K 3.6 4.2  CL 103 100  CO2 33* 30  GLUCOSE 143* 155*  BUN 18 21  CREATININE 0.89 0.99  CALCIUM 10.6* 10.3   Liver Function Tests: No results found for this basename: AST, ALT, ALKPHOS, BILITOT, PROT, ALBUMIN,  in the last 72 hours No results found for this basename: LIPASE, AMYLASE,  in the last 72 hours CBC:  Recent Labs  08/10/13 1556 08/13/13 0415  WBC 8.3 9.1  NEUTROABS 6.3  --   HGB 11.5* 11.0*  HCT 38.9 36.6  MCV 82.4 82.8  PLT 211 PLATELET CLUMPS NOTED ON SMEAR, COUNT APPEARS ADEQUATE   Cardiac Enzymes:  Recent Labs  08/10/13 2039 08/11/13 0308 08/11/13 0903  TROPONINI <0.30 <0.30 <0.30   BNP: No components found with this basename: POCBNP,  D-Dimer: No results found for this basename: DDIMER,  in the last 72 hours Hemoglobin A1C: No results found for this basename: HGBA1C,  in the last 72 hours Fasting Lipid Panel: No results found for this basename: CHOL, HDL, LDLCALC, TRIG, CHOLHDL, LDLDIRECT,  in the last 72 hours Thyroid Function Tests: No results found for this basename: TSH, T4TOTAL, FREET3, T3FREE, THYROIDAB,  in the last 72 hours Anemia Panel: No results found for this basename: VITAMINB12, FOLATE, FERRITIN, TIBC, IRON, RETICCTPCT,  in the last 72 hours  RADIOLOGY: Dg Chest 2 View  08/10/2013   CLINICAL DATA:  Shortness of breath.  EXAM: CHEST  2 VIEW  COMPARISON:  07/09/2013  FINDINGS: Low lung volumes. Cardiac silhouette is enlarged. There is prominence of interstitial markings, indistinctness of  the pulmonary vasculature, and areas of peribronchial  cuffing. Increased density is appreciated within the right and left lower lobe regions.  IMPRESSION: Interstitial infiltrate likely representing pulmonary edema. Asymmetric edema versus focal infiltrates within the right and left lung bases left greater than right. Surveillance evaluation recommended.   Electronically Signed   By: Salome Holmes M.D.   On: 08/10/2013 16:56   Ct Head Wo Contrast  08/10/2013   CLINICAL DATA:  Fall.  Head injury.  Weakness.  EXAM: CT HEAD WITHOUT CONTRAST  TECHNIQUE: Contiguous axial images were obtained from the base of the skull through the vertex without intravenous contrast.  COMPARISON:  None.  FINDINGS: There is no evidence of intracranial hemorrhage, brain edema, or other signs of acute infarction. There is no evidence of intracranial mass lesion or mass effect. No abnormal extraaxial fluid collections are identified.  Mild cerebral atrophy is seen as well as moderate cerebellar atrophy. Ventricles are within normal limits in size. No skull fracture identified.  IMPRESSION: No acute intracranial findings.  Cerebral and cerebellar atrophy.   Electronically Signed   By: Myles Rosenthal M.D.   On: 08/10/2013 17:10   Dg Chest Port 1 View  08/12/2013   CLINICAL DATA:  Shortness of breath, followup evaluation.  EXAM: PORTABLE CHEST - 1 VIEW  COMPARISON:  Chest radiograph August 10, 2013  FINDINGS: The cardiac silhouette remains moderately enlarged, mediastinal silhouette is nonsuspicious. Low inspiratory examination with central engorged, crowded vasculature markings and increasing interstitial prominence. Strandy densities in left lung base. No pleural effusions. No pneumothorax. Multiple EKG lines overlie the patient and may obscure subtle underlying pathology.  IMPRESSION: Cardiomegaly with apparent worsening moderate pulmonary edema, strandy densities in left lung base may reflect early confluent edema or even pneumonia.  Recommend followup chest radiograph after treatment to verify improvement.   Electronically Signed   By: Awilda Metro   On: 08/12/2013 07:03      ASSESSMENT AND PLAN: 1.Acute diastolic heart failure: has diuresed considerably with more optimal BP control and diuretic titration on 12/15. Will continue current regimen/frequency for now, with probable change to bid dosing of Lasix on 12/17. Continue spironolactone 12.5 mg daily. 2. Hypertension: better controlled with increase of hydralazine to 50 mg tid, and low-dose spironolactone. Continue present therapy. 3. CAD: S/P PCI to RCA in 2013, with cath demonstrating 2-vessel disease in the RCA and with severe left circumflex disease (serial 90-95% mid LCx lesions by cath on 10-11-2011). Repeat nuclear myocardial perfusion study stress was normal in November of 2014. Continue ASA, BB, statin. No plavix was given due to hematuria and hemoptysis during Feb 2013 admission.  4. OSA: She has been non-compliant with CPAP at home according to records. Now on CPAP during this hospitalization.    Prentice Docker, M.D., F.A.C.C.

## 2013-08-13 NOTE — Progress Notes (Signed)
Pt place on home unit with o2 at 4lpm cann hr 63 spo2 96% will continue to monitor pt throughtout the night.

## 2013-08-13 NOTE — Progress Notes (Signed)
PT TRANSFERING TO 335. HR 58 TO 64 IN SR TO0 SB. OP2 SATS 100% ON O2 AT 2L/MIN VIA [place].DIMINISHED BREATH SOUNDS. LT HAND IV PATENT. FOLEY CATH PATENT, TED HOSE IN PLACE. DENIES ANY DISTRESS OR DISCOMFORT. TRANSFER REPORT CALLED TO VAL RN ON 300.

## 2013-08-14 DIAGNOSIS — R0789 Other chest pain: Secondary | ICD-10-CM

## 2013-08-14 LAB — BASIC METABOLIC PANEL
BUN: 19 mg/dL (ref 6–23)
Calcium: 10.3 mg/dL (ref 8.4–10.5)
Chloride: 101 mEq/L (ref 96–112)
GFR calc Af Amer: 69 mL/min — ABNORMAL LOW (ref >90)
GFR calc non Af Amer: 60 mL/min — ABNORMAL LOW (ref >90)
Glucose, Bld: 141 mg/dL — ABNORMAL HIGH (ref 70–99)
Potassium: 3.8 mEq/L (ref 3.5–5.1)
Sodium: 141 mEq/L (ref 135–145)

## 2013-08-14 LAB — CBC
HCT: 36.2 % (ref 36.0–46.0)
Hemoglobin: 10.6 g/dL — ABNORMAL LOW (ref 12.0–15.0)
MCV: 83 fL (ref 78.0–100.0)
RDW: 19.8 % — ABNORMAL HIGH (ref 11.5–15.5)
WBC: 7.7 10*3/uL (ref 4.0–10.5)

## 2013-08-14 LAB — GLUCOSE, CAPILLARY
Glucose-Capillary: 235 mg/dL — ABNORMAL HIGH (ref 70–99)
Glucose-Capillary: 191 mg/dL — ABNORMAL HIGH (ref 70–99)
Glucose-Capillary: 236 mg/dL — ABNORMAL HIGH (ref 70–99)

## 2013-08-14 MED ORDER — CEFUROXIME AXETIL 250 MG PO TABS
500.0000 mg | ORAL_TABLET | Freq: Two times a day (BID) | ORAL | Status: DC
  Administered 2013-08-14 – 2013-08-16 (×4): 500 mg via ORAL
  Filled 2013-08-14 (×5): qty 2

## 2013-08-14 MED ORDER — ISOSORBIDE MONONITRATE ER 60 MG PO TB24
90.0000 mg | ORAL_TABLET | Freq: Every day | ORAL | Status: DC
  Administered 2013-08-15 – 2013-08-16 (×2): 90 mg via ORAL
  Filled 2013-08-14 (×2): qty 2

## 2013-08-14 MED ORDER — FUROSEMIDE 10 MG/ML IJ SOLN
40.0000 mg | Freq: Two times a day (BID) | INTRAMUSCULAR | Status: DC
  Administered 2013-08-14 – 2013-08-15 (×2): 40 mg via INTRAVENOUS
  Filled 2013-08-14 (×2): qty 4

## 2013-08-14 NOTE — Progress Notes (Signed)
Subjective:  Breathing ok.  Objective:  Vital Signs in the last 24 hours: Temp:  [98.1 F (36.7 C)-98.8 F (37.1 C)] 98.8 F (37.1 C) (12/17 0608) Pulse Rate:  [63-65] 63 (12/17 0608) Resp:  [20-33] 20 (12/17 0608) BP: (113-165)/(41-82) 113/48 mmHg (12/17 0608) SpO2:  [97 %-100 %] 98 % (12/17 0751) Weight:  [218 lb 7.6 oz (99.1 kg)] 218 lb 7.6 oz (99.1 kg) (12/17 0500)  Intake/Output from previous day: 12/16 0701 - 12/17 0700 In: 360 [P.O.:360] Out: 700 [Urine:700] Intake/Output from this shift:    Physical Exam: NECK: Without JVD, HJR, or bruit LUNGS: Decreased breath sounds throughout with crackles at the bases HEART: Regular rate and rhythm, no murmur, gallop, rub, bruit, thrill, or heave EXTREMITIES: trace ankle edema.Without cyanosis, clubbing,   Lab Results:  Recent Labs  08/13/13 0415 08/14/13 0557  WBC 9.1 7.7  HGB 11.0* 10.6*  PLT PLATELET CLUMPS NOTED ON SMEAR, COUNT APPEARS ADEQUATE 234    Recent Labs  08/13/13 0415 08/14/13 0557  NA 139 141  K 4.2 3.8  CL 100 101  CO2 30 33*  GLUCOSE 155* 141*  BUN 21 19  CREATININE 0.99 0.88    Recent Labs  08/11/13 0903  TROPONINI <0.30   Hepatic Function Panel No results found for this basename: PROT, ALBUMIN, AST, ALT, ALKPHOS, BILITOT, BILIDIR, IBILI,  in the last 72 hours No results found for this basename: CHOL,  in the last 72 hours No results found for this basename: PROTIME,  in the last 72 hours  Imaging:   Cardiac Studies:  Assessment/Plan:  1.Acute diastolic heart failure: has diuresed considerably with more optimal BP control and diuretic titration on 12/15. Will decrease IV Lasix to 40mg  bid today . 2. Hypertension: better controlled with increase of hydralazine to 50 mg tid, and low-dose spironolactone. May need to increase spironolactone. 3. CAD: S/P PCI to RCA in 2013, with cath demonstrating 2-vessel disease in the RCA and with severe left circumflex disease (serial 90-95% mid LCx  lesions by cath on 10-11-2011). Repeat nuclear myocardial perfusion study stress was normal in November of 2014. Continue ASA, BB, statin. No plavix was given due to hematuria and hemoptysis during Feb 2013 admission.   4. OSA: She has been non-compliant with CPAP at home according to records. Now on CPAP during this hospitalization.     LOS: 4 days    Theresa Mclean 08/14/2013, 8:13 AM

## 2013-08-14 NOTE — Progress Notes (Signed)
Physical Therapy Treatment Patient Details Name: [name] Nembhard MRN: [id number] DOB: 1928/10/12 Today's Date: 08/14/2013 Time: 4098-1191 PT Time Calculation (min): 40 min I GT 1 TE  PT Assessment / Plan / Recommendation  Patient eager to get up and out of bed to recliner. Bed exercises completed with rest breaks as appropriate, patient is dyspneic throughout therapy however O2sats at 97% and above with 2L nasal cannula. Patient was able to complete 56' of gait training today with SW;Min A without O2 as tubing would not reach around end of bed. O2 sats upon completion were 90%, administered 2L with nasal cannula and sats back to 96% within seconds. Pursed lip breathing also taught with constant reminders to perform.                                          Progress towards PT Goals Progress towards PT goals: Progressing toward goals  Plan      Precautions / Restrictions Restrictions Weight Bearing Restrictions: No       Mobility  Bed Mobility Bed Mobility: Rolling Left Rolling Left: 4: Min assist Supine to Sit: 4: Min assist;HOB elevated Sitting - Scoot to Edge of Bed: 6: Modified independent (Device/Increase time) Details for Bed Mobility Assistance: verbal cueing to use LUE to assist self to upright position Transfers Transfers: Sit to Stand;Stand to Sit Sit to Stand: 4: Min assist;From elevated surface Stand to Sit: 4: Min assist;To chair/3-in-1 Details for Transfer Assistance: verbal cues for hand placements and to slow down on descent Ambulation/Gait Ambulation/Gait Assistance: 4: Min assist Ambulation Distance (Feet): 13 Feet Assistive device: Standard walker Gait Pattern: Trunk flexed;Step-through pattern;Decreased hip/knee flexion - right;Decreased hip/knee flexion - left Gait velocity: slow and labored    Exercises General Exercises - Upper Extremity Shoulder Horizontal ABduction: AROM;Seated;Both;10 reps Shoulder Horizontal ADduction: AROM;Seated;10  reps;Both General Exercises - Lower Extremity Ankle Circles/Pumps: AROM;Both;10 reps;Supine Quad Sets: AROM;Both;10 reps;Supine Gluteal Sets: 10 reps;Both;AROM;Supine Long Arc Quad: AROM;Seated;Both;Supine;10 reps Heel Slides: AROM;Both;Supine;10 reps Straight Leg Raises: AAROM;Both;Supine;10 reps   PT Diagnosis:    PT Problem List:   PT Treatment Interventions:     PT Goals (current goals can now be found in the care plan section)    Visit Information  Last PT Received On: 08/14/13 Assistance Needed: +1    Subjective Data   I don't like lying in this bed all day.             End of Session PT - End of Session Equipment Utilized During Treatment: Gait belt Activity Tolerance: Patient tolerated treatment well Patient left: in chair;with call bell/phone within reach;with chair alarm set Nurse Communication: Mobility status   GP     Denora Wysocki ATKINSO 08/14/2013, 1:41 PM

## 2013-08-14 NOTE — Progress Notes (Signed)
[facility] PROGRESS NOTE  [name] Graff WUJ:[id number] DOB: 03-Mar-1929 DOA: 08/10/2013 PCP: Ignatius Specking., MD  Assessment/Plan: 1. Acute on chronic respiratory failure with hypoxia: Acute component appears resolved. Multifactorial including acute diastolic heart failure, chronic respiratory failure, noncompliance with CPAP. Possible developing pneumonia although this is somewhat doubted. 2. Acute diastolic congestive heart failure: Continues to diurese well and lose weight. Continue management per cardiology. Much improved. 3. Possible developing community acquired pneumonia, afebrile. Stable. 4. Report of syncope 2 days prior to admission: Etiology unclear but possibly orthostatic. Monitor clinically. No further evaluation suggested. 5. Hypertension: Much improved with adjustments per cardiology. 6. Diabetes mellitus: Capillary blood sugars stable. Continue Lantus. Sliding-scale insulin. 7. Obesity: Nutrition consult appreciated 8. Obstructive sleep apnea: now compliant with CPAP 9. Chronic respiratory failure 3 L per minute nasal cannula, possibly secondary to cor pulmonale per cardiology note; stable. 10. History of coronary artery disease, Continue ASA, BB, statin. No plavix was given due to hematuria and hemoptysis during Feb 2013 admission.   Continue cardiac medications as per cardiology, current doses of hydralazine and spironolactone, titrate as needed. Lasix per cardiology. Imdur increased.  Change to oral antibiotics  Anticipate discharge home next 48 hours  Pending studies:   none  Code Status: full code DVT prophylaxis: Lovenox Family Communication: None present currently Disposition Plan: home when improved  Brendia Sacks, MD  [facility]  Pager [phone] If 7PM-7AM, please contact night-coverage at [email or web address], password [email or web address] 08/14/2013, 10:59 AM  LOS: 4 days   Summary: 79 year old woman with history of chronic diastolic congestive heart  failure presented to the emergency department with several day history of increasing lower extremity edema and shortness of breath. Initial evaluation suggested acute diastolic congestive heart failure and she was referred for admission.  Consultants:  Cardiology  Physical therapy: Home Health  Procedures:    Antibiotics:    HPI/Subjective: Slept okay. Not eating very well. Breathing better.  Objective: Filed Vitals:   08/14/13 0500 08/14/13 0608 08/14/13 0751 08/14/13 0848  BP:  113/48  110/52  Pulse:  63    Temp:  98.8 F (37.1 C)    TempSrc:  Oral    Resp:  20    Height:      Weight: 99.1 kg (218 lb 7.6 oz)     SpO2:  97% 98%     Intake/Output Summary (Last 24 hours) at 08/14/13 1059 Last data filed at 08/14/13 0831  Gross per 24 hour  Intake    300 ml  Output    700 ml  Net   -400 ml     Filed Weights   08/12/13 0538 08/13/13 0424 08/14/13 0500  Weight: 100.6 kg (221 lb 12.5 oz) 99.2 kg (218 lb 11.1 oz) 99.1 kg (218 lb 7.6 oz)    Exam:   Afebrile, vital signs stable. Stable hypoxia. Tachypnea has resolved.  General: Appears calm and comfortable.  Cardiovascular: Regular rate and rhythm. No murmur, rub or gallop. 1+ bilateral lower extremity edema.  Respiratory: Clear to auscultation bilaterally. No wheezes, rales or rhonchi. Normal respiratory effort.  Abdomen: Soft, nontender, nondistended.  Psychiatric: Grossly normal mood and affect. Speech fluent and appropriate.  Data Reviewed:  Weight without significant change  Urine output 700 last 24 hours  Capillary blood sugars stable  Basic metabolic panel unremarkable  Hemoglobin stable  Scheduled Meds: . aspirin EC  81 mg Oral Daily  . atenolol  75 mg Oral Daily  . atorvastatin  20 mg Oral QHS  . azithromycin  500 mg Oral Daily  . cefTRIAXone (ROCEPHIN)  IV  1 g Intravenous Q24H  . cloNIDine  0.2 mg Transdermal Weekly  . docusate sodium  100 mg Oral BID  . enoxaparin (LOVENOX)  injection  40 mg Subcutaneous Q24H  . folic acid  1 mg Oral Daily  . furosemide  40 mg Intravenous Q12H  . gabapentin  300 mg Oral QID  . hydrALAZINE  50 mg Oral TID  . insulin aspart  0-5 Units Subcutaneous QHS  . insulin aspart  0-9 Units Subcutaneous TID WC  . insulin glargine  15 Units Subcutaneous Daily  . [START ON 08/15/2013] isosorbide mononitrate  90 mg Oral Daily  . loratadine  10 mg Oral Daily  . montelukast  10 mg Oral Daily  . pantoprazole  40 mg Oral BID  . potassium chloride SA  20 mEq Oral Daily  . sodium chloride  3 mL Intravenous Q12H  . spironolactone  12.5 mg Oral Daily   Continuous Infusions:   Principal Problem:   Acute diastolic congestive heart failure Active Problems:   Diabetes mellitus   Obstructive sleep apnea   Chronic respiratory failure   Time spent 20 minutes

## 2013-08-14 NOTE — Progress Notes (Signed)
Patient helped with putting on her home unit CPAP, titrated 3L of 02 in, RT will continue to monitor

## 2013-08-14 NOTE — Progress Notes (Signed)
The patient was seen and examined, and I agree with the assessment and plan as documented above, with modifications as noted below. Lasix dose reduced to 40 mg IV bid today. Will continue current doses of hydralazine and spironolactone for now, but these can be increased for BP control. Will monitor for now. Pt does complain of chest heaviness (not pain per se). I will increase Imdur to 90 mg daily.

## 2013-08-14 NOTE — Progress Notes (Signed)
Inpatient Diabetes Program Recommendations  AACE/ADA: New Consensus Statement on Inpatient Glycemic Control (2013)  Target Ranges:  Prepandial:   less than 140 mg/dL      Peak postprandial:   less than 180 mg/dL (1-2 hours)      Critically ill patients:  140 - 180 mg/dL   Results for [place], [name] (MRN [id number]) as of 08/14/2013 09:53  Ref. Range 08/13/2013 16:05 08/13/2013 21:13 08/14/2013 07:20  Glucose-Capillary Latest Range: 70-99 mg/dL 409 (H) 811 (H) 914 (H)    Inpatient Diabetes Program Recommendations Correction (SSI): Please consider increasing Novolog correction to moderate scale ACHS.  Thanks, Orlando Penner, RN, MSN, CCRN Diabetes Coordinator Inpatient Diabetes Program [phone] (Team Pager) [phone] (AP office) [phone] [facility] office)

## 2013-08-15 LAB — GLUCOSE, CAPILLARY: Glucose-Capillary: 153 mg/dL — ABNORMAL HIGH (ref 70–99)

## 2013-08-15 LAB — BASIC METABOLIC PANEL
Chloride: 97 mEq/L (ref 96–112)
Creatinine, Ser: 0.88 mg/dL (ref 0.50–1.10)
GFR calc Af Amer: 69 mL/min — ABNORMAL LOW (ref >90)
GFR calc non Af Amer: 60 mL/min — ABNORMAL LOW (ref >90)
Potassium: 3.7 mEq/L (ref 3.5–5.1)

## 2013-08-15 MED ORDER — SPIRONOLACTONE 25 MG PO TABS
25.0000 mg | ORAL_TABLET | Freq: Every day | ORAL | Status: DC
  Administered 2013-08-15 – 2013-08-16 (×2): 25 mg via ORAL
  Filled 2013-08-15: qty 1

## 2013-08-15 MED ORDER — HYDRALAZINE HCL 25 MG PO TABS
75.0000 mg | ORAL_TABLET | Freq: Three times a day (TID) | ORAL | Status: DC
  Administered 2013-08-15 (×2): 75 mg via ORAL
  Administered 2013-08-15: 25 mg via ORAL
  Administered 2013-08-16: 75 mg via ORAL
  Filled 2013-08-15 (×4): qty 3

## 2013-08-15 MED ORDER — FUROSEMIDE 40 MG PO TABS
40.0000 mg | ORAL_TABLET | Freq: Every day | ORAL | Status: DC
  Administered 2013-08-16: 40 mg via ORAL
  Filled 2013-08-15: qty 1

## 2013-08-15 NOTE — Progress Notes (Signed)
[facility] PROGRESS NOTE  Theresa Mclean [id number]:[id number] DOB: Feb 12, 1929 DOA: 08/10/2013 PCP: Ignatius Specking., MD  Summary: 79 year old woman with history of chronic diastolic congestive heart failure presented to the emergency department with several day history of increasing lower extremity edema and shortness of breath. Initial evaluation suggested acute diastolic congestive heart failure and she was referred for admission.  Assessment/Plan: 1. Acute on chronic respiratory failure with hypoxia: Continues to improve. Acute component resolved. Multifactorial including acute diastolic heart failure, chronic respiratory failure, noncompliance with CPAP. Possible developing pneumonia although this is somewhat doubted. 2. Acute diastolic congestive heart failure: Continues to diuresis well, creatinine preserved. Weights and accurate. Approaching euvolemic. 3. Possible developing community acquired pneumonia, afebrile. Appears clinically resolved. 4. Report of syncope 2 days prior to admission: Etiology unclear but possibly orthostatic. Monitor clinically. No further evaluation suggested. 5. Hypertension: Well controlled with adjustments per cardiology. 6. Diabetes mellitus: Capillary blood sugars stable. Continue Lantus. Sliding-scale insulin. 7. Obesity: Nutrition consult appreciated 8. Obstructive sleep apnea: compliant with CPAP 9. Chronic respiratory failure 3 L per minute nasal cannula, possibly secondary to cor pulmonale per cardiology note; stable. 10. History of coronary artery disease, Continue ASA, BB, statin. No plavix was given due to hematuria and hemoptysis during Feb 2013 admission.   Continue cardiac medications as per cardiology, current doses of hydralazine and spironolactone, titrate as needed. Lasix per cardiology can likely change to oral within the next 24 hours,. Imdur increased.   Continue oral antibiotics for pneumonia, possible  Clearly improving. Anticipate  discharge home next 24-48 hours  Pending studies:   none  Code Status: full code DVT prophylaxis: Lovenox Family Communication: None present currently Disposition Plan: home when improved  Brendia Sacks, MD  [facility]  Pager [phone] If 7PM-7AM, please contact night-coverage at [email or web address], password [facility] 08/15/2013, 11:19 AM  LOS: 5 days   Consultants:  Cardiology  Physical therapy: Home Health  Procedures:    Antibiotics:    HPI/Subjective: Feeling better. Breathing better. Eating better. Tolerating CPAP. Eating okay.  Objective: Filed Vitals:   08/14/13 0848 08/14/13 1513 08/14/13 2023 08/15/13 0434  BP: 110/52 131/65 145/66 129/65  Pulse:  60 63 76  Temp:  98.6 F (37 C) 98.5 F (36.9 C) 98.6 F (37 C)  TempSrc:  Oral Oral Oral  Resp:  19 19 19   Height:      Weight:    100.6 kg (221 lb 12.5 oz)  SpO2:  97% 98% 93%    Intake/Output Summary (Last 24 hours) at 08/15/13 1119 Last data filed at 08/15/13 1031  Gross per 24 hour  Intake    660 ml  Output   2300 ml  Net  -1640 ml     Filed Weights   08/13/13 0424 08/14/13 0500 08/15/13 0434  Weight: 99.2 kg (218 lb 11.1 oz) 99.1 kg (218 lb 7.6 oz) 100.6 kg (221 lb 12.5 oz)    Exam:   Afebrile, vital signs stable.   General: Appears calm and comfortable. Well appearing today.  Cardiovascular: Regular rate and rhythm. No murmur, rub or gallop. 1+ bilateral lower extremity edema with skin wrinkling, improving  Respiratory: Clear to auscultation bilaterally. No wheezes, rales rhonchi. Normal respiratory effort.  Abdomen: Soft, nontender, nondistended.  Data Reviewed:  Weight without significant change  -4.9 L since admission, urine output 1550 last 24 hours  Capillary blood sugars stable  Basic metabolic panel unremarkable  Scheduled Meds: . aspirin EC  81 mg Oral Daily  . atenolol  75  mg Oral Daily  . atorvastatin  20 mg Oral QHS  . azithromycin  500 mg Oral Daily  .  cefUROXime  500 mg Oral BID WC  . cloNIDine  0.2 mg Transdermal Weekly  . docusate sodium  100 mg Oral BID  . enoxaparin (LOVENOX) injection  40 mg Subcutaneous Q24H  . folic acid  1 mg Oral Daily  . furosemide  40 mg Intravenous Q12H  . gabapentin  300 mg Oral QID  . hydrALAZINE  50 mg Oral TID  . insulin aspart  0-5 Units Subcutaneous QHS  . insulin aspart  0-9 Units Subcutaneous TID WC  . insulin glargine  15 Units Subcutaneous Daily  . isosorbide mononitrate  90 mg Oral Daily  . loratadine  10 mg Oral Daily  . montelukast  10 mg Oral Daily  . pantoprazole  40 mg Oral BID  . potassium chloride SA  20 mEq Oral Daily  . sodium chloride  3 mL Intravenous Q12H  . spironolactone  12.5 mg Oral Daily   Continuous Infusions:   Principal Problem:   Acute diastolic congestive heart failure Active Problems:   Diabetes mellitus   Obstructive sleep apnea   Chronic respiratory failure   Time spent 15 minutes

## 2013-08-15 NOTE — Progress Notes (Signed)
Inpatient Diabetes Program Recommendations  AACE/ADA: New Consensus Statement on Inpatient Glycemic Control (2013)  Target Ranges:  Prepandial:   less than 140 mg/dL      Peak postprandial:   less than 180 mg/dL (1-2 hours)      Critically ill patients:  140 - 180 mg/dL   Results for [place], [name] (MRN [id number]) as of 08/15/2013 10:51  Ref. Range 08/14/2013 07:20 08/14/2013 11:25 08/14/2013 16:19 08/14/2013 20:16 08/15/2013 07:23  Glucose-Capillary Latest Range: 70-99 mg/dL 409 (H) 811 (H) 914 (H) 236 (H) 153 (H)    Inpatient Diabetes Program Recommendations Correction (SSI): Please consider increasing Novolog correction to moderate scale ACHS.  Thanks, Orlando Penner, RN, MSN, CCRN Diabetes Coordinator Inpatient Diabetes Program [phone] (Team Pager) [phone] (AP office) [phone] [facility] office)

## 2013-08-15 NOTE — Progress Notes (Signed)
SUBJECTIVE: Pt's chest pressure has resolved with increase in Imdur yesterday. Says breathing is much better, as is leg swelling.     Intake/Output Summary (Last 24 hours) at 08/15/13 1136 Last data filed at 08/15/13 1031  Gross per 24 hour  Intake    660 ml  Output   2300 ml  Net  -1640 ml    Current Facility-Administered Medications  Medication Dose Route Frequency Provider Last Rate Last Dose  . 0.9 %  sodium chloride infusion  250 mL Intravenous PRN Standley Brooking, MD      . acetaminophen (TYLENOL) tablet 650 mg  650 mg Oral Q4H PRN Standley Brooking, MD   650 mg at 08/13/13 1458  . aspirin EC tablet 81 mg  81 mg Oral Daily Standley Brooking, MD   81 mg at 08/15/13 1051  . atenolol (TENORMIN) tablet 75 mg  75 mg Oral Daily Standley Brooking, MD   75 mg at 08/15/13 1050  . atorvastatin (LIPITOR) tablet 20 mg  20 mg Oral QHS Standley Brooking, MD   20 mg at 08/13/13 2103  . azithromycin [facility]) tablet 500 mg  500 mg Oral Daily Standley Brooking, MD   500 mg at 08/15/13 1051  . cefUROXime (CEFTIN) tablet 500 mg  500 mg Oral BID WC Standley Brooking, MD   500 mg at 08/15/13 1610  . cloNIDine (CATAPRES - Dosed in mg/24 hr) patch 0.2 mg  0.2 mg Transdermal Weekly Standley Brooking, MD   0.2 mg at 08/11/13 [phone]  . docusate sodium (COLACE) capsule 100 mg  100 mg Oral BID Standley Brooking, MD   100 mg at 08/15/13 1049  . enoxaparin (LOVENOX) injection 40 mg  40 mg Subcutaneous Q24H Standley Brooking, MD   40 mg at 08/14/13 1837  . folic acid (FOLVITE) tablet 1 mg  1 mg Oral Daily Standley Brooking, MD   1 mg at 08/15/13 1050  . furosemide (LASIX) injection 40 mg  40 mg Intravenous Q12H Dyann Kief, PA-C   40 mg at 08/15/13 5409  . gabapentin (NEURONTIN) capsule 300 mg  300 mg Oral QID Standley Brooking, MD   300 mg at 08/15/13 1050  . hydrALAZINE (APRESOLINE) tablet 50 mg  50 mg Oral TID Laqueta Linden, MD   50 mg at 08/15/13 1051  . insulin aspart (novoLOG)  injection 0-5 Units  0-5 Units Subcutaneous QHS Standley Brooking, MD   2 Units at 08/14/13 2201  . insulin aspart (novoLOG) injection 0-9 Units  0-9 Units Subcutaneous TID WC Standley Brooking, MD   2 Units at 08/15/13 [phone]  . insulin glargine (LANTUS) injection 15 Units  15 Units Subcutaneous Daily Standley Brooking, MD   15 Units at 08/15/13 1051  . isosorbide mononitrate (IMDUR) 24 hr tablet 90 mg  90 mg Oral Daily Laqueta Linden, MD   90 mg at 08/15/13 1048  . loratadine (CLARITIN) tablet 10 mg  10 mg Oral Daily Standley Brooking, MD   10 mg at 08/15/13 1051  . montelukast (SINGULAIR) tablet 10 mg  10 mg Oral Daily Standley Brooking, MD   10 mg at 08/15/13 1051  . nitroGLYCERIN (NITROSTAT) SL tablet 0.4 mg  0.4 mg Sublingual Q5 min PRN Standley Brooking, MD      . ondansetron [facility]) injection 4 mg  4 mg Intravenous Q6H PRN Standley Brooking, MD      .  pantoprazole (PROTONIX) EC tablet 40 mg  40 mg Oral BID Standley Brooking, MD   40 mg at 08/15/13 1050  . polyethylene glycol (MIRALAX / GLYCOLAX) packet 17 g  17 g Oral Daily PRN Standley Brooking, MD      . potassium chloride SA (K-DUR,KLOR-CON) CR tablet 20 mEq  20 mEq Oral Daily Standley Brooking, MD   20 mEq at 08/15/13 1050  . sodium chloride 0.9 % injection 3 mL  3 mL Intravenous Q12H Standley Brooking, MD   3 mL at 08/14/13 0852  . sodium chloride 0.9 % injection 3 mL  3 mL Intravenous PRN Standley Brooking, MD      . spironolactone (ALDACTONE) tablet 12.5 mg  12.5 mg Oral Daily Laqueta Linden, MD   12.5 mg at 08/15/13 1052    Filed Vitals:   08/14/13 1513 08/14/13 2023 08/15/13 0434 08/15/13 1132  BP: 131/65 145/66 129/65 171/55  Pulse: 60 63 76 64  Temp: 98.6 F (37 C) 98.5 F (36.9 C) 98.6 F (37 C)   TempSrc: Oral Oral Oral   Resp: 19 19 19 19   Height:      Weight:   221 lb 12.5 oz (100.6 kg)   SpO2: 97% 98% 93% 97%    PHYSICAL EXAM General: NAD Neck: No JVD, no thyromegaly or thyroid nodule.  Lungs:  decreased breath sounds bilaterally with normal respiratory effort. CV: Nondisplaced PMI.  Regular rhythm, normal S1/S2, no S3/S4, no murmur. Trace pretibial edema.  No carotid bruit.  Normal pedal pulses.  Abdomen: Soft, nontender, no hepatosplenomegaly, no distention.  Neurologic: Alert and oriented x 3.  Psych: Normal affect. Extremities: No clubbing or cyanosis.     LABS: Basic Metabolic Panel:  Recent Labs  19/14/78 0557 08/15/13 0605  NA 141 138  K 3.8 3.7  CL 101 97  CO2 33* 33*  GLUCOSE 141* 156*  BUN 19 20  CREATININE 0.88 0.88  CALCIUM 10.3 10.5   Liver Function Tests: No results found for this basename: AST, ALT, ALKPHOS, BILITOT, PROT, ALBUMIN,  in the last 72 hours No results found for this basename: LIPASE, AMYLASE,  in the last 72 hours CBC:  Recent Labs  08/13/13 0415 08/14/13 0557  WBC 9.1 7.7  HGB 11.0* 10.6*  HCT 36.6 36.2  MCV 82.8 83.0  PLT PLATELET CLUMPS NOTED ON SMEAR, COUNT APPEARS ADEQUATE 234   Cardiac Enzymes: No results found for this basename: CKTOTAL, CKMB, CKMBINDEX, TROPONINI,  in the last 72 hours BNP: No components found with this basename: POCBNP,  D-Dimer: No results found for this basename: DDIMER,  in the last 72 hours Hemoglobin A1C: No results found for this basename: HGBA1C,  in the last 72 hours Fasting Lipid Panel: No results found for this basename: CHOL, HDL, LDLCALC, TRIG, CHOLHDL, LDLDIRECT,  in the last 72 hours Thyroid Function Tests: No results found for this basename: TSH, T4TOTAL, FREET3, T3FREE, THYROIDAB,  in the last 72 hours Anemia Panel: No results found for this basename: VITAMINB12, FOLATE, FERRITIN, TIBC, IRON, RETICCTPCT,  in the last 72 hours  RADIOLOGY: Dg Chest 2 View  08/10/2013   CLINICAL DATA:  Shortness of breath.  EXAM: CHEST  2 VIEW  COMPARISON:  07/09/2013  FINDINGS: Low lung volumes. Cardiac silhouette is enlarged. There is prominence of interstitial markings, indistinctness of the  pulmonary vasculature, and areas of peribronchial cuffing. Increased density is appreciated within the right and left lower lobe regions.  IMPRESSION: Interstitial infiltrate  likely representing pulmonary edema. Asymmetric edema versus focal infiltrates within the right and left lung bases left greater than right. Surveillance evaluation recommended.   Electronically Signed   By: Salome Holmes M.D.   On: 08/10/2013 16:56   Ct Head Wo Contrast  08/10/2013   CLINICAL DATA:  Fall.  Head injury.  Weakness.  EXAM: CT HEAD WITHOUT CONTRAST  TECHNIQUE: Contiguous axial images were obtained from the base of the skull through the vertex without intravenous contrast.  COMPARISON:  None.  FINDINGS: There is no evidence of intracranial hemorrhage, brain edema, or other signs of acute infarction. There is no evidence of intracranial mass lesion or mass effect. No abnormal extraaxial fluid collections are identified.  Mild cerebral atrophy is seen as well as moderate cerebellar atrophy. Ventricles are within normal limits in size. No skull fracture identified.  IMPRESSION: No acute intracranial findings.  Cerebral and cerebellar atrophy.   Electronically Signed   By: Myles Rosenthal M.D.   On: 08/10/2013 17:10   Dg Chest Port 1 View  08/12/2013   CLINICAL DATA:  Shortness of breath, followup evaluation.  EXAM: PORTABLE CHEST - 1 VIEW  COMPARISON:  Chest radiograph August 10, 2013  FINDINGS: The cardiac silhouette remains moderately enlarged, mediastinal silhouette is nonsuspicious. Low inspiratory examination with central engorged, crowded vasculature markings and increasing interstitial prominence. Strandy densities in left lung base. No pleural effusions. No pneumothorax. Multiple EKG lines overlie the patient and may obscure subtle underlying pathology.  IMPRESSION: Cardiomegaly with apparent worsening moderate pulmonary edema, strandy densities in left lung base may reflect early confluent edema or even pneumonia.  Recommend followup chest radiograph after treatment to verify improvement.   Electronically Signed   By: Awilda Metro   On: 08/12/2013 07:03      ASSESSMENT AND PLAN: 1.Acute diastolic heart failure: has diuresed considerably with more optimal BP control and diuretic titration on 12/15. Will switch to Lasix 40mg  po daily. However, BP still not optimally controlled. 2. Hypertension: uncontrolled today with SBP > 170 mmHg. Will increase hydralazine to 75 mg tid, and increase spironolactone to 25 mg daily. 3. CAD: chest pressure resolved with increase of Imdur on 12/17. S/P PCI to RCA in 2013, with cath demonstrating 2-vessel disease in the RCA and with severe left circumflex disease (serial 90-95% mid LCx lesions by cath on 10-11-2011). Repeat nuclear myocardial perfusion study stress was normal in November of 2014. Continue ASA, BB, statin, as well as Imdur 90 mg daily. No plavix was given due to hematuria and hemoptysis during Feb 2013 admission.  4. OSA: She has been non-compliant with CPAP at home according to records. Now on CPAP during this hospitalization.     Prentice Docker, M.D., F.A.C.C.

## 2013-08-16 LAB — GLUCOSE, CAPILLARY: Glucose-Capillary: 297 mg/dL — ABNORMAL HIGH (ref 70–99)

## 2013-08-16 LAB — BASIC METABOLIC PANEL
BUN: 19 mg/dL (ref 6–23)
Calcium: 10.9 mg/dL — ABNORMAL HIGH (ref 8.4–10.5)
Creatinine, Ser: 0.79 mg/dL (ref 0.50–1.10)
GFR calc Af Amer: 87 mL/min — ABNORMAL LOW (ref >90)
GFR calc non Af Amer: 75 mL/min — ABNORMAL LOW (ref >90)
Glucose, Bld: 173 mg/dL — ABNORMAL HIGH (ref 70–99)

## 2013-08-16 MED ORDER — INSULIN GLARGINE 100 UNIT/ML ~~LOC~~ SOLN: 30.0000 [IU] | Freq: Every day | SUBCUTANEOUS | Status: DC

## 2013-08-16 MED ORDER — SPIRONOLACTONE 25 MG PO TABS: 25.0000 mg | ORAL_TABLET | Freq: Every day | ORAL | Status: DC

## 2013-08-16 MED ORDER — ISOSORBIDE MONONITRATE ER 60 MG PO TB24: 90.0000 mg | ORAL_TABLET | Freq: Every day | ORAL | Status: DC

## 2013-08-16 MED ORDER — HYDRALAZINE HCL 25 MG PO TABS: 75.0000 mg | ORAL_TABLET | Freq: Three times a day (TID) | ORAL | Status: DC

## 2013-08-16 MED ORDER — FUROSEMIDE 40 MG PO TABS: 40.0000 mg | ORAL_TABLET | Freq: Every day | ORAL | Status: DC

## 2013-08-16 NOTE — Progress Notes (Signed)
Inpatient Diabetes Program Recommendations  AACE/ADA: New Consensus Statement on Inpatient Glycemic Control (2013)  Target Ranges:  Prepandial:   less than 140 mg/dL      Peak postprandial:   less than 180 mg/dL (1-2 hours)      Critically ill patients:  140 - 180 mg/dL   Results for Kerin, [name] (MRN [id number]) as of 08/16/2013 10:59  Ref. Range 08/15/2013 07:23 08/15/2013 11:29 08/15/2013 16:20 08/15/2013 20:04 08/16/2013 07:31  Glucose-Capillary Latest Range: 70-99 mg/dL 147 (H) 829 (H) 562 (H) 294 (H) 169 (H)    Inpatient Diabetes Program Recommendations Correction (SSI): Please consider increasing Novolog correction to moderate scale. Insulin - Meal Coverage: Please consider ordering Novolog 3 units TID with meals for meal coverage.  Note:  Fasting glucose noted to be in target range over the past 4 mornings.  However, post prandial glucose consistently elevated.  Please consider increasing Novolog correction to moderate scale and adding Novolog 3 units TID for meal coverage.  Will continue to follow.  Thanks, Orlando Penner, RN, MSN, CCRN Diabetes Coordinator Inpatient Diabetes Program [phone] (Team Pager) [phone] ([facility] office) [phone] [facility] office)

## 2013-08-16 NOTE — Progress Notes (Signed)
Patient is discharged and waiting for her ride home.  Will continue to monitor the patient until ride arrives.

## 2013-08-16 NOTE — Progress Notes (Signed)
At approximately 0200, the telemetry monitor technician called to report that patient had converted from nsr to an accelerated junctional rhythm.  Patient rhythm is still in the 60s, as it has been since admission. Patient is now sleeping in bed with bipap on.  No obvious distress noted.  Received no new orders from md.  Will continue to monitor patient.

## 2013-08-16 NOTE — Progress Notes (Signed)
SUBJECTIVE: Pt doing well this morning. No complaints of chest pressure or shortness of breath.     Intake/Output Summary (Last 24 hours) at 08/16/13 1102 Last data filed at 08/16/13 1001  Gross per 24 hour  Intake    470 ml  Output    800 ml  Net   -330 ml    Current Facility-Administered Medications  Medication Dose Route Frequency Provider Last Rate Last Dose  . 0.9 %  sodium chloride infusion  250 mL Intravenous PRN Standley Brooking, MD      . acetaminophen (TYLENOL) tablet 650 mg  650 mg Oral Q4H PRN Standley Brooking, MD   650 mg at 08/13/13 1458  . aspirin EC tablet 81 mg  81 mg Oral Daily Standley Brooking, MD   81 mg at 08/16/13 1007  . atenolol (TENORMIN) tablet 75 mg  75 mg Oral Daily Standley Brooking, MD   75 mg at 08/16/13 1005  . atorvastatin (LIPITOR) tablet 20 mg  20 mg Oral QHS Standley Brooking, MD   20 mg at 08/15/13 2212  . azithromycin [facility]) tablet 500 mg  500 mg Oral Daily Standley Brooking, MD   500 mg at 08/16/13 1005  . cefUROXime (CEFTIN) tablet 500 mg  500 mg Oral BID WC Standley Brooking, MD   500 mg at 08/16/13 1610  . cloNIDine (CATAPRES - Dosed in mg/24 hr) patch 0.2 mg  0.2 mg Transdermal Weekly Standley Brooking, MD   0.2 mg at 08/11/13 [phone]  . docusate sodium (COLACE) capsule 100 mg  100 mg Oral BID Standley Brooking, MD   100 mg at 08/16/13 1006  . enoxaparin (LOVENOX) injection 40 mg  40 mg Subcutaneous Q24H Standley Brooking, MD   40 mg at 08/15/13 2213  . folic acid (FOLVITE) tablet 1 mg  1 mg Oral Daily Standley Brooking, MD   1 mg at 08/16/13 1007  . furosemide (LASIX) tablet 40 mg  40 mg Oral Daily Laqueta Linden, MD   40 mg at 08/16/13 1007  . gabapentin (NEURONTIN) capsule 300 mg  300 mg Oral QID Standley Brooking, MD   300 mg at 08/16/13 1008  . hydrALAZINE (APRESOLINE) tablet 75 mg  75 mg Oral TID Laqueta Linden, MD   75 mg at 08/16/13 1023  . insulin aspart (novoLOG) injection 0-5 Units  0-5 Units Subcutaneous  QHS Standley Brooking, MD   3 Units at 08/15/13 2213  . insulin aspart (novoLOG) injection 0-9 Units  0-9 Units Subcutaneous TID WC Standley Brooking, MD   2 Units at 08/16/13 0940  . insulin glargine (LANTUS) injection 15 Units  15 Units Subcutaneous Daily Standley Brooking, MD   15 Units at 08/16/13 1000  . isosorbide mononitrate (IMDUR) 24 hr tablet 90 mg  90 mg Oral Daily Laqueta Linden, MD   90 mg at 08/16/13 1004  . loratadine (CLARITIN) tablet 10 mg  10 mg Oral Daily Standley Brooking, MD   10 mg at 08/16/13 1008  . montelukast (SINGULAIR) tablet 10 mg  10 mg Oral Daily Standley Brooking, MD   10 mg at 08/16/13 1007  . nitroGLYCERIN (NITROSTAT) SL tablet 0.4 mg  0.4 mg Sublingual Q5 min PRN Standley Brooking, MD      . ondansetron [facility]) injection 4 mg  4 mg Intravenous Q6H PRN Standley Brooking, MD      .  pantoprazole (PROTONIX) EC tablet 40 mg  40 mg Oral BID Standley Brooking, MD   40 mg at 08/16/13 1006  . polyethylene glycol (MIRALAX / GLYCOLAX) packet 17 g  17 g Oral Daily PRN Standley Brooking, MD   17 g at 08/15/13 1648  . potassium chloride SA (K-DUR,KLOR-CON) CR tablet 20 mEq  20 mEq Oral Daily Standley Brooking, MD   20 mEq at 08/16/13 1005  . sodium chloride 0.9 % injection 3 mL  3 mL Intravenous Q12H Standley Brooking, MD   3 mL at 08/16/13 1009  . sodium chloride 0.9 % injection 3 mL  3 mL Intravenous PRN Standley Brooking, MD      . spironolactone (ALDACTONE) tablet 25 mg  25 mg Oral Daily Laqueta Linden, MD   25 mg at 08/16/13 1008    Filed Vitals:   08/15/13 0434 08/15/13 1132 08/15/13 2000 08/16/13 0437  BP: 129/65 171/55 175/58 147/42  Pulse: 76 64 68 61  Temp: 98.6 F (37 C)  98.6 F (37 C) 98.4 F (36.9 C)  TempSrc: Oral  Oral Oral  Resp: 19 19 19 19   Height:      Weight: 221 lb 12.5 oz (100.6 kg)   222 lb 14.2 oz (101.1 kg)  SpO2: 93% 97% 99% 68%    PHYSICAL EXAM General: NAD  Neck: No JVD, no thyromegaly or thyroid nodule.  Lungs: clear  bilaterally with normal respiratory effort.  CV: Nondisplaced PMI. Regular rhythm, normal S1/S2, no S3/S4, I/VI holosystolic murmur along left sternal border. No pretibial edema with skin wrinkling. No carotid bruit. Normal pedal pulses.  Abdomen: Soft, nontender, no hepatosplenomegaly, no distention.  Neurologic: Alert and oriented x 3.  Psych: Normal affect.  Extremities: No clubbing or cyanosis.    TELEMETRY: Reviewed telemetry pt in NSR.  LABS: Basic Metabolic Panel:  Recent Labs  40/98/11 0605 08/16/13 0640  NA 138 138  K 3.7 4.1  CL 97 98  CO2 33* 34*  GLUCOSE 156* 173*  BUN 20 19  CREATININE 0.88 0.79  CALCIUM 10.5 10.9*   Liver Function Tests: No results found for this basename: AST, ALT, ALKPHOS, BILITOT, PROT, ALBUMIN,  in the last 72 hours No results found for this basename: LIPASE, AMYLASE,  in the last 72 hours CBC:  Recent Labs  08/14/13 0557  WBC 7.7  HGB 10.6*  HCT 36.2  MCV 83.0  PLT 234   Cardiac Enzymes: No results found for this basename: CKTOTAL, CKMB, CKMBINDEX, TROPONINI,  in the last 72 hours BNP: No components found with this basename: POCBNP,  D-Dimer: No results found for this basename: DDIMER,  in the last 72 hours Hemoglobin A1C: No results found for this basename: HGBA1C,  in the last 72 hours Fasting Lipid Panel: No results found for this basename: CHOL, HDL, LDLCALC, TRIG, CHOLHDL, LDLDIRECT,  in the last 72 hours Thyroid Function Tests: No results found for this basename: TSH, T4TOTAL, FREET3, T3FREE, THYROIDAB,  in the last 72 hours Anemia Panel: No results found for this basename: VITAMINB12, FOLATE, FERRITIN, TIBC, IRON, RETICCTPCT,  in the last 72 hours  RADIOLOGY: Dg Chest 2 View  08/10/2013   CLINICAL DATA:  Shortness of breath.  EXAM: CHEST  2 VIEW  COMPARISON:  07/09/2013  FINDINGS: Low lung volumes. Cardiac silhouette is enlarged. There is prominence of interstitial markings, indistinctness of the pulmonary  vasculature, and areas of peribronchial cuffing. Increased density is appreciated within the right and left lower  lobe regions.  IMPRESSION: Interstitial infiltrate likely representing pulmonary edema. Asymmetric edema versus focal infiltrates within the right and left lung bases left greater than right. Surveillance evaluation recommended.   Electronically Signed   By: Salome Holmes M.D.   On: 08/10/2013 16:56   Ct Head Wo Contrast  08/10/2013   CLINICAL DATA:  Fall.  Head injury.  Weakness.  EXAM: CT HEAD WITHOUT CONTRAST  TECHNIQUE: Contiguous axial images were obtained from the base of the skull through the vertex without intravenous contrast.  COMPARISON:  None.  FINDINGS: There is no evidence of intracranial hemorrhage, brain edema, or other signs of acute infarction. There is no evidence of intracranial mass lesion or mass effect. No abnormal extraaxial fluid collections are identified.  Mild cerebral atrophy is seen as well as moderate cerebellar atrophy. Ventricles are within normal limits in size. No skull fracture identified.  IMPRESSION: No acute intracranial findings.  Cerebral and cerebellar atrophy.   Electronically Signed   By: Myles Rosenthal M.D.   On: 08/10/2013 17:10   Dg Chest Port 1 View  08/12/2013   CLINICAL DATA:  Shortness of breath, followup evaluation.  EXAM: PORTABLE CHEST - 1 VIEW  COMPARISON:  Chest radiograph August 10, 2013  FINDINGS: The cardiac silhouette remains moderately enlarged, mediastinal silhouette is nonsuspicious. Low inspiratory examination with central engorged, crowded vasculature markings and increasing interstitial prominence. Strandy densities in left lung base. No pleural effusions. No pneumothorax. Multiple EKG lines overlie the patient and may obscure subtle underlying pathology.  IMPRESSION: Cardiomegaly with apparent worsening moderate pulmonary edema, strandy densities in left lung base may reflect early confluent edema or even pneumonia. Recommend  followup chest radiograph after treatment to verify improvement.   Electronically Signed   By: Awilda Metro   On: 08/12/2013 07:03      ASSESSMENT AND PLAN: 1.Acute diastolic heart failure: has diuresed considerably and appears to be euvolemic. Continue Lasix 40mg  po daily. BP more optimally controlled.  2. Hypertension: much better controlled today. Will continue hydralazine 75 mg tid, along with spironolactone 25 mg daily.  3. CAD: chest pressure resolved with increase of Imdur on 12/17. S/P PCI to RCA in 2013, with cath demonstrating 2-vessel disease in the RCA and with severe left circumflex disease (serial 90-95% mid LCx lesions by cath on 10-11-2011). Repeat nuclear myocardial perfusion study stress was normal in November of 2014. Continue ASA, BB, statin, as well as Imdur 90 mg daily. No plavix was given due to hematuria and hemoptysis during Feb 2013 admission.  4. OSA: She has been non-compliant with CPAP at home according to records. Now on CPAP during this hospitalization.   Dispo: no further recommendations. Will sign off for now. Please call with questions. Will have her f/u in the office within the next few weeks.   Prentice Docker, M.D., F.A.C.C.

## 2013-08-16 NOTE — Progress Notes (Signed)
Telemetry called and notified nurse that patient was back in nsr. Patient in no distress, resting in bed.

## 2013-08-16 NOTE — Discharge Summary (Signed)
Physician Discharge Summary  Theresa Mclean ZOX:[id number] DOB: 11/18/1928 DOA: 08/10/2013  PCP: Ignatius Specking., MD  Admit date: 08/10/2013 Discharge date: 08/16/2013  Time spent: 40 minutes  Recommendations for Outpatient Follow-up:  1. Follow up with primary care doctor in 1 week 2. Follow up with cardiology in 2 weeks 3. Resume home health RN, PT and aide  Discharge Diagnoses:  Principal Problem:   Acute diastolic congestive heart failure Active Problems:   Diabetes mellitus   Obstructive sleep apnea   Chronic respiratory failure   Discharge Condition: improved  Diet recommendation: low salt  Filed Weights   08/14/13 0500 08/15/13 0434 08/16/13 0437  Weight: 99.1 kg (218 lb 7.6 oz) 100.6 kg (221 lb 12.5 oz) 101.1 kg (222 lb 14.2 oz)    History of present illness:  79 year old woman with history of chronic diastolic congestive heart failure presented to the emergency department with several day history of increasing lower extremity edema and shortness of breath. Initial evaluation suggested acute diastolic congestive heart failure and she was referred for admission.  History obtained from patient as well as son at bedside. The patient has been compliant with a no salt diet and with her medications. She did however does not weigh herself even though her son has provided her with a scale. Over the last several days she has had increasing swelling of her bilateral lower extremities with tightness and some pain. She has also had some increased shortness of breath over the last several days. No chest pain.  In the emergency department noted to be afebrile with stable vital signs. Chemistry panel, troponin, BNP, CBC unremarkable. EKG not acute. Chest x-ray suggested pulmonary edema. Urinalysis was unremarkable. CT head no acute findings. 2-D echocardiogram 05/30/2013: Left ventricular ejection fraction 60-65%. Grade 2 diastolic dysfunction.   Hospital Course:  This patient was  admitted to the hospital with shortness of breath due to diastolic congestive heart failure. The patient was seen by cardiology and diuresed very well with IV Lasix. She is now approaching euvolemic. The patient does have chronic respiratory failure, on 3 L of oxygen. She has obstructive sleep apnea and has been noncompliant with CPAP therapy at home. The patient does feel that she is back to her baseline. Cardiology has changed back to oral Lasix and has signed off. Her cardiac medications have also been further adjusted. The patient reports that she ambulates with a walker, often sleeps in a recliner or her hospital bed at home. She feels she is back to her baseline and is comfortable with discharge home today. She'll follow up with cardiology next 2 weeks, follow up with primary care physician next 1 week. Resume home health RN to assist with management of congestive heart failure.  Procedures:  none  Consultations:  cardiology  Discharge Exam: Filed Vitals:   08/16/13 0437  BP: 147/42  Pulse: 61  Temp: 98.4 F (36.9 C)  Resp: 19    General: NAD, feels back to baseline Cardiovascular: S1, S2 RRR Respiratory: CTA B  Discharge Instructions  Discharge Orders   Future Appointments Provider Department Dept Phone   09/16/2013 3:00 PM Laqueta Linden, MD [facility] [phone]   Future Orders Complete By Expires   (HEART FAILURE PATIENTS) Call MD:  Anytime you have any of the following symptoms: 1) 3 pound weight gain in 24 hours or 5 pounds in 1 week 2) shortness of breath, with or without a dry hacking cough 3) swelling in the hands, feet  or stomach 4) if you have to sleep on extra pillows at night in order to breathe.  As directed    Call MD for:  difficulty breathing, headache or visual disturbances  As directed    Diet - low sodium heart healthy  As directed    Diet Carb Modified  As directed    Increase activity slowly  As directed         Medication List         acitretin 10 MG capsule  Commonly known as:  SORIATANE  Take 10 mg by mouth daily with supper.     amLODipine 10 MG tablet  Commonly known as:  NORVASC  Take 10 mg by mouth daily.     aspirin 81 MG tablet  Take 81 mg by mouth daily.     atenolol 50 MG tablet  Commonly known as:  TENORMIN  Take 75 mg by mouth daily.     atorvastatin 20 MG tablet  Commonly known as:  LIPITOR  Take 20 mg by mouth at bedtime.     calcium-vitamin D 500-200 MG-UNIT per tablet  Commonly known as:  OSCAL WITH D  Take 1 tablet by mouth daily with breakfast.     clobetasol 0.05 % topical foam  Commonly known as:  OLUX  Apply to scalp daily as needed     cloNIDine 0.2 mg/24hr patch  Commonly known as:  CATAPRES - Dosed in mg/24 hr  Place 1 patch onto the skin once a week.     docusate sodium 100 MG capsule  Commonly known as:  COLACE  Take 100 mg by mouth 2 (two) times daily.     fexofenadine 180 MG tablet  Commonly known as:  ALLEGRA  Take 180 mg by mouth daily. During allergy seasons.     FLUOCINOLONE ACETONIDE BODY 0.01 % Oil  Apply 1 application topically at bedtime.     fluocinonide ointment 0.05 %  Commonly known as:  LIDEX  Apply to affected areas of the body daily as needed. Not to face.     folic acid 1 MG tablet  Commonly known as:  FOLVITE  Take 1 mg by mouth daily.     furosemide 40 MG tablet  Commonly known as:  LASIX  Take 1 tablet (40 mg total) by mouth daily. (MAY TAKE AN EXTRA 40MG  AS NEEDED FOR INCREASED LEG SWELLING AND/OR SHORTNESS OF BREATH     gabapentin 300 MG capsule  Commonly known as:  NEURONTIN  Take 300 mg by mouth 4 (four) times daily.     hydrALAZINE 25 MG tablet  Commonly known as:  APRESOLINE  Take 3 tablets (75 mg total) by mouth 3 (three) times daily.     insulin aspart 100 UNIT/ML injection  Commonly known as:  novoLOG  - SLIDING SCALE 0-9 Units, Injected subcutaneous, 3 times daily with meals.  - CBG < 70:  implement hypoglycemia protocol  - CBG 70 - 120: 0 units  - CBG 121 - 150: 1 unit  - CBG 151 - 200: 2 units  - CBG 201 - 250: 3 units  - CBG 251 - 300: 5 units  - CBG 301 - 350: 7 units  - CBG 351 - 400: 9 units  - CBG > 400: call MD     insulin glargine 100 UNIT/ML injection  Commonly known as:  LANTUS  Inject 0.3 mLs (30 Units total) into the skin daily.     isosorbide mononitrate 60 MG  24 hr tablet  Commonly known as:  IMDUR  Take 1.5 tablets (90 mg total) by mouth daily.     methotrexate 2.5 MG tablet  Commonly known as:  RHEUMATREX  Take 7.5 mg by mouth every Wednesday. Caution:Chemotherapy. Protect from light. Takes on Wednesday.     montelukast 10 MG tablet  Commonly known as:  SINGULAIR  Take 10 mg by mouth daily.     nitroGLYCERIN 0.4 MG SL tablet  Commonly known as:  NITROSTAT  Place 1 tablet (0.4 mg total) under the tongue every 5 (five) minutes as needed for chest pain.     pantoprazole 40 MG tablet  Commonly known as:  PROTONIX  Take 40 mg by mouth 2 (two) times daily.     polyethylene glycol packet  Commonly known as:  MIRALAX / GLYCOLAX  Take 17 g by mouth daily as needed for moderate constipation.     potassium chloride SA 20 MEQ tablet  Commonly known as:  K-DUR,KLOR-CON  Take 20 mEq by mouth daily.     spironolactone 25 MG tablet  Commonly known as:  ALDACTONE  Take 1 tablet (25 mg total) by mouth daily.       No Known Allergies    The results of significant diagnostics from this hospitalization (including imaging, microbiology, ancillary and laboratory) are listed below for reference.    Significant Diagnostic Studies: Dg Chest 2 View  08/10/2013   CLINICAL DATA:  Shortness of breath.  EXAM: CHEST  2 VIEW  COMPARISON:  07/09/2013  FINDINGS: Low lung volumes. Cardiac silhouette is enlarged. There is prominence of interstitial markings, indistinctness of the pulmonary vasculature, and areas of peribronchial cuffing. Increased density  is appreciated within the right and left lower lobe regions.  IMPRESSION: Interstitial infiltrate likely representing pulmonary edema. Asymmetric edema versus focal infiltrates within the right and left lung bases left greater than right. Surveillance evaluation recommended.   Electronically Signed   By: Salome Holmes M.D.   On: 08/10/2013 16:56   Ct Head Wo Contrast  08/10/2013   CLINICAL DATA:  Fall.  Head injury.  Weakness.  EXAM: CT HEAD WITHOUT CONTRAST  TECHNIQUE: Contiguous axial images were obtained from the base of the skull through the vertex without intravenous contrast.  COMPARISON:  None.  FINDINGS: There is no evidence of intracranial hemorrhage, brain edema, or other signs of acute infarction. There is no evidence of intracranial mass lesion or mass effect. No abnormal extraaxial fluid collections are identified.  Mild cerebral atrophy is seen as well as moderate cerebellar atrophy. Ventricles are within normal limits in size. No skull fracture identified.  IMPRESSION: No acute intracranial findings.  Cerebral and cerebellar atrophy.   Electronically Signed   By: Myles Rosenthal M.D.   On: 08/10/2013 17:10   Dg Chest Port 1 View  08/12/2013   CLINICAL DATA:  Shortness of breath, followup evaluation.  EXAM: PORTABLE CHEST - 1 VIEW  COMPARISON:  Chest radiograph August 10, 2013  FINDINGS: The cardiac silhouette remains moderately enlarged, mediastinal silhouette is nonsuspicious. Low inspiratory examination with central engorged, crowded vasculature markings and increasing interstitial prominence. Strandy densities in left lung base. No pleural effusions. No pneumothorax. Multiple EKG lines overlie the patient and may obscure subtle underlying pathology.  IMPRESSION: Cardiomegaly with apparent worsening moderate pulmonary edema, strandy densities in left lung base may reflect early confluent edema or even pneumonia. Recommend followup chest radiograph after treatment to verify improvement.    Electronically Signed   By: Pernell Dupre  Bloomer   On: 08/12/2013 07:03    Microbiology: Recent Results (from the past 240 hour(s))  CULTURE, BLOOD (ROUTINE X 2)     Status: None   Collection Time    08/12/13 10:35 AM      Result Value Range Status   Specimen Description BLOOD LEFT FINGER   Final   Special Requests BOTTLES DRAWN AEROBIC AND ANAEROBIC 5CC   Final   Culture NO GROWTH 2 DAYS   Final   Report Status PENDING   Incomplete  CULTURE, BLOOD (ROUTINE X 2)     Status: None   Collection Time    08/12/13 10:45 AM      Result Value Range Status   Specimen Description BLOOD LEFT HAND   Final   Special Requests BOTTLES DRAWN AEROBIC AND ANAEROBIC 5CC   Final   Culture NO GROWTH 2 DAYS   Final   Report Status PENDING   Incomplete  MRSA PCR SCREENING     Status: None   Collection Time    08/12/13  1:30 PM      Result Value Range Status   MRSA by PCR NEGATIVE  NEGATIVE Final   Comment:            The GeneXpert MRSA Assay (FDA     approved for NASAL specimens     only), is one component of a     comprehensive MRSA colonization     surveillance program. It is not     intended to diagnose MRSA     infection nor to guide or     monitor treatment for     MRSA infections.     Labs: Basic Metabolic Panel:  Recent Labs Lab 08/12/13 0612 08/13/13 0415 08/14/13 0557 08/15/13 0605 08/16/13 0640  NA 142 139 141 138 138  K 3.6 4.2 3.8 3.7 4.1  CL 103 100 101 97 98  CO2 33* 30 33* 33* 34*  GLUCOSE 143* 155* 141* 156* 173*  BUN 18 21 19 20 19   CREATININE 0.89 0.99 0.88 0.88 0.79  CALCIUM 10.6* 10.3 10.3 10.5 10.9*   Liver Function Tests: No results found for this basename: AST, ALT, ALKPHOS, BILITOT, PROT, ALBUMIN,  in the last 168 hours No results found for this basename: LIPASE, AMYLASE,  in the last 168 hours No results found for this basename: AMMONIA,  in the last 168 hours CBC:  Recent Labs Lab 08/10/13 1556 08/13/13 0415 08/14/13 0557  WBC 8.3 9.1 7.7   NEUTROABS 6.3  --   --   HGB 11.5* 11.0* 10.6*  HCT 38.9 36.6 36.2  MCV 82.4 82.8 83.0  PLT 211 PLATELET CLUMPS NOTED ON SMEAR, COUNT APPEARS ADEQUATE 234   Cardiac Enzymes:  Recent Labs Lab 08/10/13 1556 08/10/13 2039 08/11/13 0308 08/11/13 0903  TROPONINI <0.30 <0.30 <0.30 <0.30   BNP: BNP (last 3 results)  Recent Labs  07/09/13 1937 08/10/13 1556  PROBNP 390.6 378.0   CBG:  Recent Labs Lab 08/15/13 1129 08/15/13 1620 08/15/13 2004 08/16/13 0731 08/16/13 1108  GLUCAP 226* 303* 294* 169* 297*       Signed:  MEMON,JEHANZEB  [facility] 08/16/2013, 2:53 PM

## 2013-08-16 NOTE — Progress Notes (Signed)
Patient is prepared for discharge and has roomair SpO2 ranging from 86-92%. Patient's son has not brought patient's portable oxygen tank from home. RN has been notified.

## 2013-08-19 LAB — CULTURE, BLOOD (ROUTINE X 2): Culture: NO GROWTH

## 2013-08-27 ENCOUNTER — Telehealth: Payer: Self-pay | Admitting: Cardiovascular Disease

## 2013-08-27 NOTE — Telephone Encounter (Signed)
Call placed to Dr. Sherril Croon office to see what he suggested for her today.  MD advised bumping up her Lasix 40mg  twice a day  X 2 days, then back to previous & elevation and rest for edema in legs.    Son came by office & he was advised of above.  Advised him to follow instructions given by PMD & to call him back as advised if her symptoms are not getting better.  Also, advised him to take her to ED if necessary.  Patient already has follow up scheduled for 09/16/2013 with Dr. Purvis Sheffield.

## 2013-09-16 ENCOUNTER — Ambulatory Visit (INDEPENDENT_AMBULATORY_CARE_PROVIDER_SITE_OTHER): Payer: Medicare Other | Admitting: Cardiovascular Disease

## 2013-09-16 ENCOUNTER — Encounter: Payer: Self-pay | Admitting: Cardiovascular Disease

## 2013-09-16 VITALS — BP 126/68 | HR 59 | Ht 64.0 in

## 2013-09-16 DIAGNOSIS — I10 Essential (primary) hypertension: Secondary | ICD-10-CM

## 2013-09-16 DIAGNOSIS — I2510 Atherosclerotic heart disease of native coronary artery without angina pectoris: Secondary | ICD-10-CM

## 2013-09-16 DIAGNOSIS — G4733 Obstructive sleep apnea (adult) (pediatric): Secondary | ICD-10-CM

## 2013-09-16 DIAGNOSIS — I739 Peripheral vascular disease, unspecified: Secondary | ICD-10-CM

## 2013-09-16 DIAGNOSIS — E785 Hyperlipidemia, unspecified: Secondary | ICD-10-CM

## 2013-09-16 DIAGNOSIS — I5032 Chronic diastolic (congestive) heart failure: Secondary | ICD-10-CM

## 2013-09-16 NOTE — Patient Instructions (Signed)
   Continue all current medications. Follow up in  10-12 weeks

## 2013-09-16 NOTE — Progress Notes (Signed)
Patient ID: [place] H Bont[place]po, female   DOB: 29-Mar-1929, 80 y.o.   MRN: [id number]      SUBJECTIVE: The patient is here to followup on multiple cardiovascular issues, which include chronic diastolic heart failure, hypertension, coronary artery disease with previous percutaneous coronary intervention, and hyperlipidemia. She has chronic respiratory failure and uses 3 L of oxygen at home. She has obstructive sleep apnea and has now been compliant with CPAP at home. She was hospitalized for acute diastolic heart failure this past December. An echocardiogram in October 2014 showed normal left ventricular systolic function with an ejection fraction of 60-65% with grade 2 diastolic dysfunction. She has been feeling well and denies chest pain, shortness of breath, palpitations and leg swelling. She takes her medications as prescribed. She lives with her son [name], who also feels that she is doing very well.    No Known Allergies  Current Outpatient Prescriptions  Medication Sig Dispense Refill  . acitretin (SORIATANE) 10 MG capsule Take 10 mg by mouth daily with supper.      Marland Kitchen amLODipine (NORVASC) 10 MG tablet Take 10 mg by mouth daily.      Marland Kitchen aspirin 81 MG tablet Take 81 mg by mouth daily.      Marland Kitchen atenolol (TENORMIN) 50 MG tablet Take 75 mg by mouth daily.      Marland Kitchen atorvastatin (LIPITOR) 20 MG tablet Take 20 mg by mouth at bedtime.       . calcium-vitamin D (OSCAL WITH D) 500-200 MG-UNIT per tablet Take 1 tablet by mouth daily with breakfast.      . clobetasol (OLUX) 0.05 % topical foam Apply to scalp daily as needed      . cloNIDine (CATAPRES - DOSED IN MG/24 HR) 0.2 mg/24hr patch Place 1 patch onto the skin once a week.      . docusate sodium (COLACE) 100 MG capsule Take 100 mg by mouth 2 (two) times daily.       . fexofenadine (ALLEGRA) 180 MG tablet Take 180 mg by mouth daily. During allergy seasons.      Marland Kitchen FLUOCINOLONE ACETONIDE BODY 0.01 % OIL Apply 1 application topically at bedtime.      .  fluocinonide ointment (LIDEX) 0.05 % Apply to affected areas of the body daily as needed. Not to face.      . folic acid (FOLVITE) 1 MG tablet Take 1 mg by mouth daily.      . furosemide (LASIX) 40 MG tablet Take 1 tablet (40 mg total) by mouth daily. (MAY TAKE AN EXTRA 40MG  AS NEEDED FOR INCREASED LEG SWELLING AND/OR SHORTNESS OF BREATH  30 tablet    . gabapentin (NEURONTIN) 300 MG capsule Take 300 mg by mouth 4 (four) times daily.       . hydrALAZINE (APRESOLINE) 25 MG tablet Take 3 tablets (75 mg total) by mouth 3 (three) times daily.  90 tablet  1  . insulin aspart (NOVOLOG) 100 UNIT/ML injection SLIDING SCALE 0-9 Units, Injected subcutaneous, 3 times daily with meals. CBG < 70: implement hypoglycemia protocol CBG 70 - 120: 0 units CBG 121 - 150: 1 unit CBG 151 - 200: 2 units CBG 201 - 250: 3 units CBG 251 - 300: 5 units CBG 301 - 350: 7 units CBG 351 - 400: 9 units CBG > 400: call MD      . insulin glargine (LANTUS) 100 UNIT/ML injection Inject 0.3 mLs (30 Units total) into the skin daily.  10 mL  12  .  isosorbide mononitrate (IMDUR) 60 MG 24 hr tablet Take 1.5 tablets (90 mg total) by mouth daily.  30 tablet  1  . ketoconazole (NIZORAL) 2 % shampoo Apply 1 application topically once a week.      . methotrexate (RHEUMATREX) 2.5 MG tablet Take 5 mg by mouth every Wednesday. Caution:Chemotherapy. Protect from light. Takes on Wednesday.      . montelukast (SINGULAIR) 10 MG tablet Take 10 mg by mouth daily.      . nitroGLYCERIN (NITROSTAT) 0.4 MG SL tablet Place 1 tablet (0.4 mg total) under the tongue every 5 (five) minutes as needed for chest pain.  25 tablet  3  . pantoprazole (PROTONIX) 40 MG tablet Take 40 mg by mouth 2 (two) times daily.       . polyethylene glycol (MIRALAX / GLYCOLAX) packet Take 17 g by mouth daily as needed for moderate constipation.      . potassium chloride SA (K-DUR,KLOR-CON) 20 MEQ tablet Take 20 mEq by mouth daily.      Marland Kitchen spironolactone (ALDACTONE) 25 MG tablet  Take 1 tablet (25 mg total) by mouth daily.  30 tablet  1   No current facility-administered medications for this visit.    Past Medical History  Diagnosis Date  . Hypertension   . Nephrolithiasis   . GERD (gastroesophageal reflux disease)   . Arthritis   . Psoriasis   . Morbid obesity   . Mitral regurgitation   . Hyperlipidemia   . Sleep apnea     uses cpap  . CHF (congestive heart failure)     Diastolic. EF 55-65% by cath 10/11/11  . Diabetes mellitus     insulin dependent  . Acute renal insufficiency     09/2011. not on ACEI secondary to this (also has renal artery stenosis)  . Peripheral vascular disease     severe right renal artery stenosis and left SFA stenosis by PV angio 09/2011, treated medically  . Cellulitis   . CAD (coronary artery disease)     NSTEMI 09/2011 felt poor candidate for CABG, instead s/p PTCA/DES to mid RCA 10/17/11  . Chronic respiratory failure   . Pulmonary hypertension   . Obstructive sleep apnea   . CKD (chronic kidney disease) stage 2, GFR 60-89 ml/min 07/11/2013    Past Surgical History  Procedure Laterality Date  . Acromio-clavicular joint repair  2011  . Abdominal hysterectomy    . Eye surgery      cataract  OD    History   Social History  . Marital Status: Divorced    Spouse Name: N/A    Number of Children: N/A  . Years of Education: N/A   Occupational History  . Not on file.   Social History Main Topics  . Smoking status: Never Smoker   . Smokeless tobacco: Never Used  . Alcohol Use: No  . Drug Use: No  . Sexual Activity: No   Other Topics Concern  . Not on file   Social History Narrative  . No narrative on file     Filed Vitals:   09/16/13 1455  BP: 126/68  Pulse: 59  Height: 5\' 4"  (1.626 m)  SpO2: 97%    PHYSICAL EXAM General: NAD, using oxygen by nasal cannula Neck: No JVD, no thyromegaly or thyroid nodule.  Lungs: Clear to auscultation bilaterally with normal respiratory effort. CV: Nondisplaced PMI.   Heart regular S1/S2, no S3/S4, no murmur.  No peripheral edema.  No carotid bruit.  Normal pedal  pulses.  Abdomen: Soft, nontender, no hepatosplenomegaly, no distention.  Neurologic: Alert and oriented x 3.  Psych: Normal affect. Extremities: No clubbing or cyanosis.   ECG: reviewed and available in electronic records.      ASSESSMENT AND PLAN: 1. Chronic diastolic heart failure: compensated and euvolemic. Continue Lasix 40mg  po daily, along with hydralazine and spironolactone. BP well controlled.  2. Hypertension: controlled on present therapy. Will continue hydralazine 75 mg tid, along with spironolactone 25 mg daily, along with amlodipine 10 mg daily and atenolol 75 mg daily. She is not on an ACEI reportedly because of severe right renal artery stenosis. 3. CAD: symptomatically stable. S/P PCI to RCA in 2013, with cath demonstrating 2-vessel disease in the RCA and with severe left circumflex disease (serial 90-95% mid LCx lesions by cath on 10-11-2011). Repeat nuclear myocardial perfusion study stress was normal in November of 2014. Continue ASA, BB, statin, as well as Imdur 90 mg daily. 4. OSA: She has been compliant with CPAP. 5. PVD: severe right renal artery stenosis and left SFA stenosis by PV angio 09/2011.  6. Hyperlipidemia: on Lipitor 20 mg daily..  Dispo: f/u 8-10 weeks.  Kate Sable, M.D., F.A.C.C.

## 2013-10-21 ENCOUNTER — Other Ambulatory Visit: Payer: Self-pay | Admitting: *Deleted

## 2013-10-21 MED ORDER — ISOSORBIDE MONONITRATE ER 60 MG PO TB24: 90.0000 mg | ORAL_TABLET | Freq: Every day | ORAL | Status: DC

## 2013-11-14 ENCOUNTER — Ambulatory Visit (INDEPENDENT_AMBULATORY_CARE_PROVIDER_SITE_OTHER): Payer: Medicare Other | Admitting: Cardiovascular Disease

## 2013-11-14 ENCOUNTER — Ambulatory Visit: Payer: Medicare Other | Admitting: Cardiovascular Disease

## 2013-11-14 ENCOUNTER — Encounter: Payer: Self-pay | Admitting: Cardiovascular Disease

## 2013-11-14 VITALS — BP 151/60 | HR 56 | Ht 64.0 in | Wt 216.0 lb

## 2013-11-14 DIAGNOSIS — N189 Chronic kidney disease, unspecified: Secondary | ICD-10-CM

## 2013-11-14 DIAGNOSIS — E785 Hyperlipidemia, unspecified: Secondary | ICD-10-CM

## 2013-11-14 DIAGNOSIS — I739 Peripheral vascular disease, unspecified: Secondary | ICD-10-CM

## 2013-11-14 DIAGNOSIS — I10 Essential (primary) hypertension: Secondary | ICD-10-CM

## 2013-11-14 DIAGNOSIS — G4733 Obstructive sleep apnea (adult) (pediatric): Secondary | ICD-10-CM

## 2013-11-14 DIAGNOSIS — I5032 Chronic diastolic (congestive) heart failure: Secondary | ICD-10-CM

## 2013-11-14 DIAGNOSIS — I2510 Atherosclerotic heart disease of native coronary artery without angina pectoris: Secondary | ICD-10-CM

## 2013-11-14 NOTE — Patient Instructions (Signed)
Continue all current medications. Follow up in  3 months 

## 2013-11-14 NOTE — Progress Notes (Signed)
Patient ID: Theresa Mclean, female   DOB: [birth year], 80 y.o.   MRN: [id number]      SUBJECTIVE: The patient is here to followup on multiple cardiovascular issues, which include chronic diastolic heart failure, hypertension, coronary artery disease with previous percutaneous coronary intervention, and hyperlipidemia. She has chronic respiratory failure and uses 3 L of oxygen at home. She has obstructive sleep apnea and has been compliant with CPAP at home. She was hospitalized for acute diastolic heart failure this past December. An echocardiogram in October 2014 showed normal left ventricular systolic function with an ejection fraction of 60-65% with grade 2 diastolic dysfunction.   She has been feeling well and denies chest pain, shortness of breath, palpitations and leg swelling. She lives with her son [name], who is also my patient. She is here with her daughter, Thayer Headings.        No Known Allergies  Current Outpatient Prescriptions  Medication Sig Dispense Refill  . acitretin (SORIATANE) 10 MG capsule Take 10 mg by mouth daily with supper.      Marland Kitchen amLODipine (NORVASC) 10 MG tablet Take 10 mg by mouth daily.      Marland Kitchen aspirin 81 MG tablet Take 81 mg by mouth daily.      Marland Kitchen atenolol (TENORMIN) 50 MG tablet Take 75 mg by mouth daily.      Marland Kitchen atorvastatin (LIPITOR) 20 MG tablet Take 20 mg by mouth at bedtime.       . calcium-vitamin D (OSCAL WITH D) 500-200 MG-UNIT per tablet Take 1 tablet by mouth daily with breakfast.      . clobetasol (OLUX) 0.05 % topical foam Apply to scalp daily as needed      . docusate sodium (COLACE) 100 MG capsule Take 100 mg by mouth 2 (two) times daily.       . fexofenadine (ALLEGRA) 180 MG tablet Take 180 mg by mouth daily. During allergy seasons.      Marland Kitchen FLUOCINOLONE ACETONIDE BODY 0.01 % OIL Apply 1 application topically at bedtime. ON SCALP      . fluocinonide ointment (LIDEX) 0.05 % Apply to affected areas of the body daily as needed. Not to face.      . folic  acid (FOLVITE) 1 MG tablet Take 1 mg by mouth daily.      Marland Kitchen gabapentin (NEURONTIN) 300 MG capsule Take 300 mg by mouth 4 (four) times daily.       . hydrALAZINE (APRESOLINE) 25 MG tablet Take 25 mg by mouth 3 (three) times daily.      . insulin aspart (NOVOLOG) 100 UNIT/ML injection SLIDING SCALE 0-9 Units, Injected subcutaneous, 3 times daily with meals. CBG < 70: implement hypoglycemia protocol CBG 70 - 120: 0 units CBG 121 - 150: 1 unit CBG 151 - 200: 2 units CBG 201 - 250: 3 units CBG 251 - 300: 5 units CBG 301 - 350: 7 units CBG 351 - 400: 9 units CBG > 400: call MD      . insulin glargine (LANTUS) 100 UNIT/ML injection Inject 0.3 mLs (30 Units total) into the skin daily.  10 mL  12  . isosorbide mononitrate (IMDUR) 30 MG 24 hr tablet Take 30 mg by mouth daily.      . isosorbide mononitrate (IMDUR) 60 MG 24 hr tablet Take 1.5 tablets (90 mg total) by mouth daily.  45 tablet  6  . methotrexate (RHEUMATREX) 2.5 MG tablet Take 7.5 mg by mouth every Wednesday. Caution:Chemotherapy. Protect from light. Takes  on Wednesday.      . montelukast (SINGULAIR) 10 MG tablet Take 10 mg by mouth daily.      . nitroGLYCERIN (NITROSTAT) 0.4 MG SL tablet Place 1 tablet (0.4 mg total) under the tongue every 5 (five) minutes as needed for chest pain.  25 tablet  3  . pantoprazole (PROTONIX) 40 MG tablet Take 40 mg by mouth daily.       . polyethylene glycol (MIRALAX / GLYCOLAX) packet Take 17 g by mouth daily as needed for moderate constipation.      . potassium chloride SA (K-DUR,KLOR-CON) 20 MEQ tablet Take 20 mEq by mouth daily.      Marland Kitchen spironolactone (ALDACTONE) 25 MG tablet Take 1 tablet (25 mg total) by mouth daily.  30 tablet  1  . cloNIDine (CATAPRES - DOSED IN MG/24 HR) 0.1 mg/24hr patch Place 0.1 mg onto the skin once a week.       No current facility-administered medications for this visit.    Past Medical History  Diagnosis Date  . Hypertension   . Nephrolithiasis   . GERD  (gastroesophageal reflux disease)   . Arthritis   . Psoriasis   . Morbid obesity   . Mitral regurgitation   . Hyperlipidemia   . Sleep apnea     uses cpap  . CHF (congestive heart failure)     Diastolic. EF 55-65% by cath 10/11/11  . Diabetes mellitus     insulin dependent  . Acute renal insufficiency     09/2011. not on ACEI secondary to this (also has renal artery stenosis)  . Peripheral vascular disease     severe right renal artery stenosis and left SFA stenosis by PV angio 09/2011, treated medically  . Cellulitis   . CAD (coronary artery disease)     NSTEMI 09/2011 felt poor candidate for CABG, instead s/p PTCA/DES to mid RCA 10/17/11  . Chronic respiratory failure   . Pulmonary hypertension   . Obstructive sleep apnea   . CKD (chronic kidney disease) stage 2, GFR 60-89 ml/min 07/11/2013    Past Surgical History  Procedure Laterality Date  . Acromio-clavicular joint repair  2011  . Abdominal hysterectomy    . Eye surgery      cataract  OD    History   Social History  . Marital Status: Divorced    Spouse Name: N/A    Number of Children: N/A  . Years of Education: N/A   Occupational History  . Not on file.   Social History Main Topics  . Smoking status: Never Smoker   . Smokeless tobacco: Never Used  . Alcohol Use: No  . Drug Use: No  . Sexual Activity: No   Other Topics Concern  . Not on file   Social History Narrative  . No narrative on file     Filed Vitals:   11/14/13 1456  BP: 151/60  Pulse: 56  Height: 5\' 4"  (1.626 m)  Weight: 216 lb (97.977 kg)  SpO2: 99%    PHYSICAL EXAM General: NAD Neck: No JVD, no thyromegaly. Lungs: Clear to auscultation bilaterally with normal respiratory effort. CV: Nondisplaced PMI.  Regular rate and rhythm, normal S1/S2, no S3/S4, soft I/VI systolic murmur along left sternal border. No pretibial or periankle edema.  No carotid bruit.  Normal pedal pulses.  Abdomen: Soft, nontender, no hepatosplenomegaly, no  distention.  Neurologic: Alert and oriented x 3.  Psych: Normal affect. Extremities: No clubbing or cyanosis.   ECG: reviewed and  available in electronic records.      ASSESSMENT AND PLAN: 1. Chronic diastolic heart failure: compensated and euvolemic. Continue Lasix 40mg  po daily, along with hydralazine and spironolactone. BP reasonably well controlled for age. 2. Hypertension: reasonably controlled for age on present therapy. Will continue hydralazine 25 mg tid, along with spironolactone 25 mg daily, amlodipine 10 mg daily and atenolol 75 mg daily. She is not on an ACEI reportedly because of severe right renal artery stenosis.  3. CAD: symptomatically stable. S/P PCI to RCA in 2013, with cath demonstrating 2-vessel disease in the RCA and with severe left circumflex disease (serial 90-95% mid LCx lesions by cath on 10-11-2011). Repeat nuclear myocardial perfusion study stress was normal in November of 2014. Continue ASA, BB, statin, as well as Imdur 90 mg daily.  4. OSA: She has been compliant with CPAP.  5. PVD: severe right renal artery stenosis and left SFA stenosis by PV angio 09/2011.  6. Hyperlipidemia: on Lipitor 20 mg daily.  Dispo: f/u 3 months.    Kate Sable, M.D., F.A.C.C.

## 2013-11-15 ENCOUNTER — Telehealth: Payer: Self-pay | Admitting: *Deleted

## 2013-11-15 MED ORDER — FUROSEMIDE 40 MG PO TABS: 40.0000 mg | ORAL_TABLET | Freq: Every day | ORAL | Status: DC

## 2013-11-15 NOTE — Telephone Encounter (Signed)
Need clarification on isosorbide dose and also whether or not she is to go back on furosemide that Dr. Woody Seller put her on. Patient's list has imdur 60 mg 1.5 tablets(90 mg)  plus 30 mg tablets 1 daily. MD notes states that patient should continue furosemide but it wasn't with her medications on yesterday. Please clarify medications since profile, patient instructions and office notes give different directions.

## 2013-11-15 NOTE — Telephone Encounter (Signed)
Made an attempt to call Theresa Mclean at [facility] but no answer so new prescriptions with directions and clarifications faxed to [facility].

## 2013-11-15 NOTE — Telephone Encounter (Signed)
Yes, continue Lasix 40 mg daily as documented in my assessment and plan, and take only 90 mg Imdur daily. Thanks!

## 2013-11-16 DIAGNOSIS — M545 Low back pain, unspecified: Secondary | ICD-10-CM | POA: Insufficient documentation

## 2013-11-16 DIAGNOSIS — G894 Chronic pain syndrome: Secondary | ICD-10-CM | POA: Insufficient documentation

## 2013-11-16 DIAGNOSIS — M25569 Pain in unspecified knee: Secondary | ICD-10-CM | POA: Insufficient documentation

## 2013-11-16 DIAGNOSIS — G549 Nerve root and plexus disorder, unspecified: Secondary | ICD-10-CM | POA: Insufficient documentation

## 2013-11-24 ENCOUNTER — Emergency Department (HOSPITAL_COMMUNITY)
Admission: EM | Admit: 2013-11-24 | Discharge: 2013-11-24 | Disposition: A | Discharge status: Home / Self Care | Payer: Medicare Other | Attending: Emergency Medicine | Admitting: Emergency Medicine

## 2013-11-24 ENCOUNTER — Emergency Department (HOSPITAL_COMMUNITY): Payer: Medicare Other

## 2013-11-24 ENCOUNTER — Encounter (HOSPITAL_COMMUNITY): Payer: Self-pay | Admitting: Emergency Medicine

## 2013-11-24 DIAGNOSIS — I2789 Other specified pulmonary heart diseases: Secondary | ICD-10-CM | POA: Insufficient documentation

## 2013-11-24 DIAGNOSIS — K219 Gastro-esophageal reflux disease without esophagitis: Secondary | ICD-10-CM | POA: Insufficient documentation

## 2013-11-24 DIAGNOSIS — G4730 Sleep apnea, unspecified: Secondary | ICD-10-CM | POA: Insufficient documentation

## 2013-11-24 DIAGNOSIS — Z9981 Dependence on supplemental oxygen: Secondary | ICD-10-CM | POA: Insufficient documentation

## 2013-11-24 DIAGNOSIS — Z87442 Personal history of urinary calculi: Secondary | ICD-10-CM | POA: Insufficient documentation

## 2013-11-24 DIAGNOSIS — R109 Unspecified abdominal pain: Secondary | ICD-10-CM

## 2013-11-24 DIAGNOSIS — R34 Anuria and oliguria: Secondary | ICD-10-CM | POA: Insufficient documentation

## 2013-11-24 DIAGNOSIS — Z90710 Acquired absence of both cervix and uterus: Secondary | ICD-10-CM | POA: Insufficient documentation

## 2013-11-24 DIAGNOSIS — I059 Rheumatic mitral valve disease, unspecified: Secondary | ICD-10-CM | POA: Insufficient documentation

## 2013-11-24 DIAGNOSIS — R059 Cough, unspecified: Secondary | ICD-10-CM | POA: Insufficient documentation

## 2013-11-24 DIAGNOSIS — M7918 Myalgia, other site: Secondary | ICD-10-CM

## 2013-11-24 DIAGNOSIS — I2510 Atherosclerotic heart disease of native coronary artery without angina pectoris: Secondary | ICD-10-CM | POA: Insufficient documentation

## 2013-11-24 DIAGNOSIS — Z7982 Long term (current) use of aspirin: Secondary | ICD-10-CM | POA: Insufficient documentation

## 2013-11-24 DIAGNOSIS — E119 Type 2 diabetes mellitus without complications: Secondary | ICD-10-CM | POA: Insufficient documentation

## 2013-11-24 DIAGNOSIS — I129 Hypertensive chronic kidney disease with stage 1 through stage 4 chronic kidney disease, or unspecified chronic kidney disease: Secondary | ICD-10-CM | POA: Insufficient documentation

## 2013-11-24 DIAGNOSIS — N182 Chronic kidney disease, stage 2 (mild): Secondary | ICD-10-CM | POA: Insufficient documentation

## 2013-11-24 DIAGNOSIS — G4733 Obstructive sleep apnea (adult) (pediatric): Secondary | ICD-10-CM | POA: Insufficient documentation

## 2013-11-24 DIAGNOSIS — I509 Heart failure, unspecified: Secondary | ICD-10-CM | POA: Insufficient documentation

## 2013-11-24 DIAGNOSIS — M129 Arthropathy, unspecified: Secondary | ICD-10-CM | POA: Insufficient documentation

## 2013-11-24 DIAGNOSIS — R350 Frequency of micturition: Secondary | ICD-10-CM

## 2013-11-24 DIAGNOSIS — Z79899 Other long term (current) drug therapy: Secondary | ICD-10-CM | POA: Insufficient documentation

## 2013-11-24 DIAGNOSIS — R1031 Right lower quadrant pain: Secondary | ICD-10-CM | POA: Insufficient documentation

## 2013-11-24 DIAGNOSIS — R6883 Chills (without fever): Secondary | ICD-10-CM | POA: Insufficient documentation

## 2013-11-24 DIAGNOSIS — R05 Cough: Secondary | ICD-10-CM | POA: Insufficient documentation

## 2013-11-24 DIAGNOSIS — Z794 Long term (current) use of insulin: Secondary | ICD-10-CM | POA: Insufficient documentation

## 2013-11-24 DIAGNOSIS — E785 Hyperlipidemia, unspecified: Secondary | ICD-10-CM | POA: Insufficient documentation

## 2013-11-24 DIAGNOSIS — Z872 Personal history of diseases of the skin and subcutaneous tissue: Secondary | ICD-10-CM | POA: Insufficient documentation

## 2013-11-24 LAB — COMPREHENSIVE METABOLIC PANEL
ALBUMIN: 4 g/dL (ref 3.5–5.2)
ALT: 17 U/L (ref 0–35)
AST: 14 U/L (ref 0–37)
Alkaline Phosphatase: 73 U/L (ref 39–117)
BILIRUBIN TOTAL: 0.6 mg/dL (ref 0.3–1.2)
BUN: 19 mg/dL (ref 6–23)
CHLORIDE: 100 meq/L (ref 96–112)
CO2: 29 mEq/L (ref 19–32)
CREATININE: 0.78 mg/dL (ref 0.50–1.10)
Calcium: 11.3 mg/dL — ABNORMAL HIGH (ref 8.4–10.5)
GFR calc Af Amer: 88 mL/min — ABNORMAL LOW (ref >90)
GFR calc non Af Amer: 76 mL/min — ABNORMAL LOW (ref >90)
Glucose, Bld: 132 mg/dL — ABNORMAL HIGH (ref 70–99)
Potassium: 4 mEq/L (ref 3.7–5.3)
Sodium: 140 mEq/L (ref 137–147)
Total Protein: 9 g/dL — ABNORMAL HIGH (ref 6.0–8.3)

## 2013-11-24 LAB — LIPASE, BLOOD: Lipase: 29 U/L (ref 11–59)

## 2013-11-24 LAB — URINALYSIS, ROUTINE W REFLEX MICROSCOPIC
BILIRUBIN URINE: NEGATIVE
Glucose, UA: NEGATIVE mg/dL
HGB URINE DIPSTICK: NEGATIVE
Ketones, ur: NEGATIVE mg/dL
Leukocytes, UA: NEGATIVE
Nitrite: NEGATIVE
Protein, ur: NEGATIVE mg/dL
Specific Gravity, Urine: 1.01 (ref 1.005–1.030)
UROBILINOGEN UA: 0.2 mg/dL (ref 0.0–1.0)
pH: 6.5 (ref 5.0–8.0)

## 2013-11-24 LAB — CBC WITH DIFFERENTIAL/PLATELET
BASOS ABS: 0 10*3/uL (ref 0.0–0.1)
BASOS PCT: 0 % (ref 0–1)
EOS ABS: 0.2 10*3/uL (ref 0.0–0.7)
Eosinophils Relative: 1 % (ref 0–5)
HCT: 35.4 % — ABNORMAL LOW (ref 36.0–46.0)
Hemoglobin: 10.7 g/dL — ABNORMAL LOW (ref 12.0–15.0)
Lymphocytes Relative: 13 % (ref 12–46)
Lymphs Abs: 1.5 10*3/uL (ref 0.7–4.0)
MCH: 24.2 pg — AB (ref 26.0–34.0)
MCHC: 30.2 g/dL (ref 30.0–36.0)
MCV: 79.9 fL (ref 78.0–100.0)
MONO ABS: 0.8 10*3/uL (ref 0.1–1.0)
Monocytes Relative: 7 % (ref 3–12)
NEUTROS ABS: 9.1 10*3/uL — AB (ref 1.7–7.7)
Neutrophils Relative %: 79 % — ABNORMAL HIGH (ref 43–77)
Platelets: 173 10*3/uL (ref 150–400)
RBC: 4.43 MIL/uL (ref 3.87–5.11)
RDW: 20.7 % — AB (ref 11.5–15.5)
WBC: 11.6 10*3/uL — ABNORMAL HIGH (ref 4.0–10.5)

## 2013-11-24 MED ORDER — METHOCARBAMOL 750 MG PO TABS: ORAL_TABLET | ORAL | Status: DC

## 2013-11-24 MED ORDER — FENTANYL CITRATE 0.05 MG/ML IJ SOLN
25.0000 ug | Freq: Once | INTRAMUSCULAR | Status: AC
  Administered 2013-11-24: 25 ug via INTRAVENOUS
  Filled 2013-11-24: qty 2

## 2013-11-24 MED ORDER — ONDANSETRON HCL 4 MG/2ML IJ SOLN
4.0000 mg | Freq: Once | INTRAMUSCULAR | Status: AC
  Administered 2013-11-24: 4 mg via INTRAVENOUS
  Filled 2013-11-24: qty 2

## 2013-11-24 MED ORDER — TRAMADOL HCL 50 MG PO TABS: 100.0000 mg | ORAL_TABLET | Freq: Four times a day (QID) | ORAL | Status: DC | PRN

## 2013-11-24 NOTE — ED Notes (Signed)
Right sided lower back pain x 2 days worse with movement.  Reports worsening pain with urination also.  States, "I'm not making water right."

## 2013-11-24 NOTE — ED Provider Notes (Signed)
CSN: [id number]     Arrival date & time 11/24/13  1107 History   First MD Initiated Contact with Patient 11/24/13 1304   This chart was scribed for Theresa Norrie, MD by Forrestine Him, ED Scribe. This patient was seen in room APA08/APA08 and the patient's care was started 1:08 PM.   Chief Complaint  Patient presents with  . Back Pain    The history is provided by the patient. No language interpreter was used.    HPI Comments: Theresa Mclean is a 80 y.o. female who presents to the Emergency Department complaining of constant R lower back pain x 2 days that is progressively worsening. She describes this pain as achy. States movement, changing positions and urinating exacerbates her pain. States deep breathing and coughing does not worsen her pain. She also reports the pain radiates into the lower abdominal pain, and she reports chills, frequency, mild cough, and decreased urinary volume when she goes. She denies any injury or falls. At this time she denies any nausea, hematuria, vomiting, diarrhea, or SOB.   Denies currently being a smoker. At this time pt is living alone but states sometimes someone comes to stay with her. Pt no longer sees a nephrologist (had renal failure after IV contrast) . She is currently on 3 liters of oxygen at home. No other concerns this visit.  PCP Dr Woody Seller  Past Medical History  Diagnosis Date  . Hypertension   . Nephrolithiasis   . GERD (gastroesophageal reflux disease)   . Arthritis   . Psoriasis   . Morbid obesity   . Mitral regurgitation   . Hyperlipidemia   . Sleep apnea     uses cpap  . CHF (congestive heart failure)     Diastolic. EF 55-65% by cath 10/11/11  . Diabetes mellitus     insulin dependent  . Acute renal insufficiency     09/2011. not on ACEI secondary to this (also has renal artery stenosis)  . Peripheral vascular disease     severe right renal artery stenosis and left SFA stenosis by PV angio 09/2011, treated medically  . Cellulitis   .  CAD (coronary artery disease)     NSTEMI 09/2011 felt poor candidate for CABG, instead s/p PTCA/DES to mid RCA 10/17/11  . Chronic respiratory failure   . Pulmonary hypertension   . Obstructive sleep apnea   . CKD (chronic kidney disease) stage 2, GFR 60-89 ml/min 07/11/2013   Past Surgical History  Procedure Laterality Date  . Acromio-clavicular joint repair  2011  . Abdominal hysterectomy    . Eye surgery      cataract  OD   Family History  Problem Relation Age of Onset  . Psoriasis Brother    History  Substance Use Topics  . Smoking status: Never Smoker   . Smokeless tobacco: Never Used  . Alcohol Use: No   Oxygen 3 lpm [place] Lives alone Uses a walker   OB History   Grav Para Term Preterm Abortions TAB SAB Ect Mult Living                 Review of Systems  Constitutional: Positive for chills. Negative for fever.  HENT: Negative for congestion.   Respiratory: Positive for cough. Negative for shortness of breath.   Gastrointestinal: Positive for abdominal pain. Negative for nausea and vomiting.  Genitourinary: Positive for frequency. Negative for hematuria.  Musculoskeletal: Positive for back pain.  All other systems reviewed and are negative.  Allergies  Review of patient's allergies indicates no known allergies.  Home Medications   Current Outpatient Rx  Name  Route  Sig  Dispense  Refill  . acitretin (SORIATANE) 10 MG capsule   Oral   Take 10 mg by mouth daily with supper.         Marland Kitchen amLODipine (NORVASC) 10 MG tablet   Oral   Take 10 mg by mouth daily.         Marland Kitchen aspirin 81 MG tablet   Oral   Take 81 mg by mouth daily.         Marland Kitchen atenolol (TENORMIN) 50 MG tablet   Oral   Take 75 mg by mouth daily.         Marland Kitchen atorvastatin (LIPITOR) 20 MG tablet   Oral   Take 20 mg by mouth at bedtime.          . calcium-vitamin D (OSCAL WITH D) 500-200 MG-UNIT per tablet   Oral   Take 1 tablet by mouth daily with breakfast.         . clobetasol  (OLUX) 0.05 % topical foam      Apply to scalp daily as needed         . cloNIDine (CATAPRES - DOSED IN MG/24 HR) 0.1 mg/24hr patch   Transdermal   Place 0.1 mg onto the skin once a week.         . docusate sodium (COLACE) 100 MG capsule   Oral   Take 100 mg by mouth 2 (two) times daily.          . fexofenadine (ALLEGRA) 180 MG tablet   Oral   Take 180 mg by mouth daily. During allergy seasons.         Marland Kitchen FLUOCINOLONE ACETONIDE BODY 0.01 % OIL   Topical   Apply 1 application topically at bedtime. ON SCALP         . fluocinonide ointment (LIDEX) 0.05 %      Apply to affected areas of the body daily as needed. Not to face.         . folic acid (FOLVITE) 1 MG tablet   Oral   Take 1 mg by mouth daily.         . furosemide (LASIX) 40 MG tablet   Oral   Take 1 tablet (40 mg total) by mouth daily.   30 tablet   3     STOP IMDUR 30MG    . gabapentin (NEURONTIN) 300 MG capsule   Oral   Take 300 mg by mouth 4 (four) times daily.          . hydrALAZINE (APRESOLINE) 25 MG tablet   Oral   Take 25 mg by mouth 3 (three) times daily.         . insulin aspart (NOVOLOG) 100 UNIT/ML injection      SLIDING SCALE 0-9 Units, Injected subcutaneous, 3 times daily with meals. CBG < 70: implement hypoglycemia protocol CBG 70 - 120: 0 units CBG 121 - 150: 1 unit CBG 151 - 200: 2 units CBG 201 - 250: 3 units CBG 251 - 300: 5 units CBG 301 - 350: 7 units CBG 351 - 400: 9 units CBG > 400: call MD         . insulin glargine (LANTUS) 100 UNIT/ML injection   Subcutaneous   Inject 0.3 mLs (30 Units total) into the skin daily.   10 mL   12   .  isosorbide mononitrate (IMDUR) 60 MG 24 hr tablet   Oral   Take 1.5 tablets (90 mg total) by mouth daily.   45 tablet   6   . methotrexate (RHEUMATREX) 2.5 MG tablet   Oral   Take 7.5 mg by mouth every Wednesday. Caution:Chemotherapy. Protect from light. Takes on Wednesday.         . montelukast (SINGULAIR) 10 MG  tablet   Oral   Take 10 mg by mouth daily.         . nitroGLYCERIN (NITROSTAT) 0.4 MG SL tablet   Sublingual   Place 1 tablet (0.4 mg total) under the tongue every 5 (five) minutes as needed for chest pain.   25 tablet   3   . pantoprazole (PROTONIX) 40 MG tablet   Oral   Take 40 mg by mouth daily.          . polyethylene glycol (MIRALAX / GLYCOLAX) packet   Oral   Take 17 g by mouth daily as needed for moderate constipation.         . potassium chloride SA (K-DUR,KLOR-CON) 20 MEQ tablet   Oral   Take 20 mEq by mouth daily.         Marland Kitchen spironolactone (ALDACTONE) 25 MG tablet   Oral   Take 1 tablet (25 mg total) by mouth daily.   30 tablet   1    Triage Vitals: BP 155/48  Pulse 58  Temp(Src) 98.4 F (36.9 C) (Oral)  Resp 18  Wt 216 lb (97.977 kg)  SpO2 97%  LMP 08/29/1990   Physical Exam  Nursing note and vitals reviewed. Constitutional: She is oriented to person, place, and time. She appears well-developed and well-nourished.  Non-toxic appearance. She does not appear ill. No distress.  HENT:  Head: Normocephalic and atraumatic.  Right Ear: External ear normal.  Left Ear: External ear normal.  Nose: Nose normal. No mucosal edema or rhinorrhea.  Mouth/Throat: Oropharynx is clear and moist and mucous membranes are normal. No dental abscesses or uvula swelling.  Eyes: Conjunctivae and EOM are normal. Pupils are equal, round, and reactive to light.  Neck: Normal range of motion and full passive range of motion without pain. Neck supple.  Cardiovascular: Normal rate, regular rhythm and normal heart sounds.  Exam reveals no gallop and no friction rub.   No murmur heard. Pulmonary/Chest: Effort normal and breath sounds normal. No respiratory distress. She has no wheezes. She has no rhonchi. She has no rales. She exhibits no tenderness and no crepitus.  Tenderness to palpation over RLQ and mid abdomen  Abdominal: Soft. Normal appearance and bowel sounds are normal.  She exhibits no distension. There is tenderness. There is no rebound and no guarding.  Genitourinary:  R CVA tenderness  Musculoskeletal: Normal range of motion. She exhibits no edema and no tenderness.  Moves all extremities well.   Neurological: She is alert and oriented to person, place, and time. She has normal strength. No cranial nerve deficit.  Skin: Skin is warm, dry and intact. No rash noted. No erythema. No pallor.  Psychiatric: She has a normal mood and affect. Her speech is normal and behavior is normal. Her mood appears not anxious.    ED Course  Procedures (including critical care time)  Medications  fentaNYL (SUBLIMAZE) injection 25 mcg (25 mcg Intravenous Given 11/24/13 1352)  ondansetron (ZOFRAN) injection 4 mg (4 mg Intravenous Given 11/24/13 1352)     DIAGNOSTIC STUDIES: Oxygen Saturation is 97% on  RA, Normal by my interpretation.    COORDINATION OF CARE: 1:17 PM- Will order Urinalysis, CBC, comp. Metabolic panel, and lipase. Will give Sublimaze and Zofran. Discussed treatment plan with pt at bedside and pt agreed to plan.     2:37 PM- Discussed test results with patient. Advised pt to follow up with PCP regarding high levels of Calcium.  Labs Review Results for orders placed during the hospital encounter of 11/24/13  URINALYSIS, ROUTINE W REFLEX MICROSCOPIC      Result Value Ref Range   Color, Urine YELLOW  YELLOW   APPearance CLOUDY (*) CLEAR   Specific Gravity, Urine 1.010  1.005 - 1.030   pH 6.5  5.0 - 8.0   Glucose, UA NEGATIVE  NEGATIVE mg/dL   Hgb urine dipstick NEGATIVE  NEGATIVE   Bilirubin Urine NEGATIVE  NEGATIVE   Ketones, ur NEGATIVE  NEGATIVE mg/dL   Protein, ur NEGATIVE  NEGATIVE mg/dL   Urobilinogen, UA 0.2  0.0 - 1.0 mg/dL   Nitrite NEGATIVE  NEGATIVE   Leukocytes, UA NEGATIVE  NEGATIVE  CBC WITH DIFFERENTIAL      Result Value Ref Range   WBC 11.6 (*) 4.0 - 10.5 K/uL   RBC 4.43  3.87 - 5.11 MIL/uL   Hemoglobin 10.7 (*) 12.0 - 15.0 g/dL    HCT 35.4 (*) 36.0 - 46.0 %   MCV 79.9  78.0 - 100.0 fL   MCH 24.2 (*) 26.0 - 34.0 pg   MCHC 30.2  30.0 - 36.0 g/dL   RDW 20.7 (*) 11.5 - 15.5 %   Platelets 173  150 - 400 K/uL   Neutrophils Relative % 79 (*) 43 - 77 %   Neutro Abs 9.1 (*) 1.7 - 7.7 K/uL   Lymphocytes Relative 13  12 - 46 %   Lymphs Abs 1.5  0.7 - 4.0 K/uL   Monocytes Relative 7  3 - 12 %   Monocytes Absolute 0.8  0.1 - 1.0 K/uL   Eosinophils Relative 1  0 - 5 %   Eosinophils Absolute 0.2  0.0 - 0.7 K/uL   Basophils Relative 0  0 - 1 %   Basophils Absolute 0.0  0.0 - 0.1 K/uL  COMPREHENSIVE METABOLIC PANEL      Result Value Ref Range   Sodium 140  137 - 147 mEq/L   Potassium 4.0  3.7 - 5.3 mEq/L   Chloride 100  96 - 112 mEq/L   CO2 29  19 - 32 mEq/L   Glucose, Bld 132 (*) 70 - 99 mg/dL   BUN 19  6 - 23 mg/dL   Creatinine, Ser 0.78  0.50 - 1.10 mg/dL   Calcium 11.3 (*) 8.4 - 10.5 mg/dL   Total Protein 9.0 (*) 6.0 - 8.3 g/dL   Albumin 4.0  3.5 - 5.2 g/dL   AST 14  0 - 37 U/L   ALT 17  0 - 35 U/L   Alkaline Phosphatase 73  39 - 117 U/L   Total Bilirubin 0.6  0.3 - 1.2 mg/dL   GFR calc non Af Amer 76 (*) >90 mL/min   GFR calc Af Amer 88 (*) >90 mL/min  LIPASE, BLOOD      Result Value Ref Range   Lipase 29  11 - 59 U/L   Laboratory interpretation all normal except hypercalcemia    Imaging Review Ct Abdomen Pelvis Wo Contrast  11/24/2013   CLINICAL DATA:  Right flank pain.  EXAM: CT ABDOMEN AND PELVIS WITHOUT  CONTRAST  TECHNIQUE: Multidetector CT imaging of the abdomen and pelvis was performed following the standard protocol without intravenous contrast.  COMPARISON:  07/10/2010  FINDINGS: Again noted is interstitial thickening at the lung bases along with atelectasis. There are coronary artery calcifications. Negative for free air.  Negative for kidney stones or hydronephrosis. Again noted are phleboliths or calcifications involving the bilateral gonadal veins. Fluid in the urinary bladder.  Unenhanced CT was  performed per clinician order. Lack of IV contrast limits sensitivity and specificity, especially for evaluation of abdominal/pelvic solid viscera. No gross abnormality to the liver, gallbladder, spleen, pancreas or adrenal glands. Normal appearance of the stomach and duodenum. There is no significant free fluid or lymphadenopathy. The abdominal aorta is heavily calcified without aneurysm. Incidentally, there is a retro aortic left renal vein which is a normal variant. No significant free fluid or lymphadenopathy. The uterus has been removed.  Stool throughout the colon. No evidence for bowel dilatation. Degenerative facet disease in the lower lumbar spine.  IMPRESSION: No acute abnormality within the abdomen or pelvis. Negative for kidney stones or hydronephrosis.  Interstitial thickening at the lung bases. Some of these densities may be related atelectasis but cannot exclude mild edema superimposed on chronic disease.  Coronary artery calcifications.   Electronically Signed   By: Markus Daft M.D.   On: 11/24/2013 14:17     EKG Interpretation None      MDM  patient presents with frequency and pain in her flank that radiates to her abdomen. She is noted to have a worsening of her hypercalcemia. Her son states she was having hypo-calcemia  and she is on vitamin D and calcium supplementation. The hypercalcemia could explain her abdominal pain and also her urinary frequency. She would need to be followed up by her PCP for this. Patient's pain also seems to be musculoskeletal in that it hurts with any type of movement or changing positions.    Final diagnoses:  Hypercalcemia  Muscular abdominal pain in right flank  Frequency of urination    Discharge Medication List as of 11/24/2013  2:49 PM    START taking these medications   Details  methocarbamol (ROBAXIN) 750 MG tablet Take 1 po QID for muscle pain, Print    traMADol (ULTRAM) 50 MG tablet Take 2 tablets (100 mg total) by mouth every 6 (six) hours  as needed., Starting 11/24/2013, Until Discontinued, Print        Plan discharge   Rolland Porter, MD, FACEP   I personally performed the services described in this documentation, which was scribed in my presence. The recorded information has been reviewed and considered.  Rolland Porter, MD, [facility]ander   Theresa Norrie, MD 11/24/13 [phone]

## 2013-11-24 NOTE — Discharge Instructions (Signed)
Try ice and heat to your pain in your right flank. Take the tramadol with acetaminophen 650 mg 4 times a day and the robaxin muscle relaxer. You need to call your doctor's office tomorrow to let them know your calcium is getting high, it was 11.3 today. A high calcium can make your urinated frequently.  Return to the ED if you get a fever, vomiting or seem worse.    Hypercalcemia Hypercalcemia means the calcium in your blood is too high. Calcium in our blood is important for the control of many things, such as:  Blood clotting.  Conducting of nerve impulses.  Muscle contraction.  Maintaining teeth and bone health.  Other body functions. In the bloodstream, calcium maintains a constant balance with another mineral, phosphate. Calcium is absorbed into the body through the small intestine. This is helped by Vitamin D. Calcium levels are maintained mostly by vitamin D and a hormone (parathyroid hormone). But the kidneys also help. Hypercalcemia can happen when the concentration of calcium is too high for the kidneys to maintain balance. The body maintains a balance between the calcium we eat and the calcium already in our body. If calcium intake is increased or we cannot use calcium properly, there may be problems. Some common sources of calcium are:   Dairy products.  Nuts.  Eggs.  Whole grains.  Legumes.  Green leafy vegetables. CAUSES There are many causes of this condition, but some common ones are:  Hyperparathyroidism. This is an over activity of the parathyroid gland.  Cancers of the breast, kidney, lung, head and neck are common causes of calcium increases.  Medications that cause you to urinate more often (diuretics), nausea, vomiting and diarrhea also increase the calcium in the blood.  Overuse of calcium-containing antacids. SYMPTOMS  Many patients with mild hypercalcemia have no symptoms. For those with symptoms common problems include:  Loss of  appetite.  Constipation.  Increased thirst.  Heart rhythm changes.  Abnormal thinking.  Nausea.  Abdominal pain.  Kidney stones.  Mood swings.  Coma and death when severe.  Vomiting.  Increased urination.  High blood pressure.  Confusion. DIAGNOSIS   Your caregiver will do a medical history and perform a physical exam on you.  Calcium and parathyroid hormone (PTH) may be measured with a blood test. TREATMENT   The treatment depends on the calcium level and what is causing the higher level. Hypercalcemia can be lifethreatening. Fast lowering of the calcium level may be necessary.  With normal kidney function, fluids can be given by vein to clear the excess calcium. Hemodialysis works well to reduce dangerous calcium levels if there is poor kidney function. This is a procedure in which a machine is used to filter out unwanted substances. The blood is then returned to the body.  Drugs, such as diuretics, can be given after adequate fluid intake is established. These medications help the kidneys get rid of extra calcium. Drugs that lessen (inhibit) bone loss are helpful in gaining long-term control. Phosphate pills help lower high calcium levels caused by a low supply of phosphate. Anti-inflammatory agents such as steroids are helpful with some cancers and toxic levels of vitamin D.  Treatment of the underlying cause of the hypercalcemia will also correct the imbalance. Hyperparathyroidism is usually treated by surgical removal of one or more of the parathyroid glands and any tissue, other than the glands themselves, that is producing too much hormone.  The hypercalcemia caused by cancer is difficult to treat without controlling the  cancer. Symptoms can be improved with fluids and drug therapy as outlined above. PROGNOSIS   Surgery to remove the parathyroid glands is usually successful. This also depends on the amount of damage to the kidneys and whether or not it can be  treated.  Mild hypercalcemia can be controlled with good fluid intake and the use of effective medications.  Hypercalcemia often develops as a late complication of cancer. The expected outlook is poor without effective anticancer therapy. PREVENTION   If you are at risk for developing hypercalcemia, be familiar with early symptoms. Report these to your caregiver.  Good fluid intake (up to four quarts of liquid a day if possible) is helpful.  Try to control nausea and vomiting, and treat fevers to avoid dehydration.  Lowering the amount of calcium in your diet is not necessary. High blood calcium reduces absorption of calcium in the intestine.  Stay as active as possible. SEEK IMMEDIATE MEDICAL CARE IF:   You develop chest pain, sweating, or shortness of breath.  You get confused, feel faint or pass out.  You develop severe nausea and vomiting. MAKE SURE YOU:   Understand these instructions.  Will watch your condition.  Will get help right away if you are not doing well or get worse. Document Released: 10/29/2004 Document Revised: 12/10/2012 Document Reviewed: 08/10/2010 [facility] Patient Information 2014 [place], [place].

## 2013-11-25 DIAGNOSIS — L219 Seborrheic dermatitis, unspecified: Secondary | ICD-10-CM | POA: Insufficient documentation

## 2014-02-07 ENCOUNTER — Encounter: Payer: Self-pay | Admitting: Cardiovascular Disease

## 2014-02-07 ENCOUNTER — Ambulatory Visit (INDEPENDENT_AMBULATORY_CARE_PROVIDER_SITE_OTHER): Payer: Medicare Other | Admitting: Cardiovascular Disease

## 2014-02-07 VITALS — BP 146/69 | HR 54 | Ht 64.0 in | Wt 213.0 lb

## 2014-02-07 DIAGNOSIS — I5032 Chronic diastolic (congestive) heart failure: Secondary | ICD-10-CM

## 2014-02-07 DIAGNOSIS — G4733 Obstructive sleep apnea (adult) (pediatric): Secondary | ICD-10-CM

## 2014-02-07 DIAGNOSIS — E785 Hyperlipidemia, unspecified: Secondary | ICD-10-CM

## 2014-02-07 DIAGNOSIS — I10 Essential (primary) hypertension: Secondary | ICD-10-CM

## 2014-02-07 DIAGNOSIS — I739 Peripheral vascular disease, unspecified: Secondary | ICD-10-CM

## 2014-02-07 DIAGNOSIS — I2510 Atherosclerotic heart disease of native coronary artery without angina pectoris: Secondary | ICD-10-CM

## 2014-02-07 DIAGNOSIS — N189 Chronic kidney disease, unspecified: Secondary | ICD-10-CM

## 2014-02-07 NOTE — Progress Notes (Signed)
Patient ID: [place] H Pusch, female   DOB: [birth year], 80 y.o.   MRN: [id number]      SUBJECTIVE: The patient is here to followup on multiple cardiovascular issues, which include chronic diastolic heart failure, hypertension, coronary artery disease with previous percutaneous coronary intervention, and hyperlipidemia. She has chronic respiratory failure and uses 3 L of oxygen at home. She has obstructive sleep apnea and has been compliant with CPAP at home. She was hospitalized for acute diastolic heart failure last December, but has not been hospitalized since then. An echocardiogram in October 2014 showed normal left ventricular systolic function with an ejection fraction of 60-65% with grade 2 diastolic dysfunction.   She has been feeling well and denies chest pain, shortness of breath, palpitations and leg swelling. She lives with her son [name], who is also my patient.  She is here with her daughter, Thayer Headings.   She is planning on going to [place], [place], next week for her son's civil service ceremony for the [employer].     No Known Allergies  Current Outpatient Prescriptions  Medication Sig Dispense Refill  . acitretin (SORIATANE) 10 MG capsule Take 10 mg by mouth daily with supper.      Marland Kitchen amLODipine (NORVASC) 10 MG tablet Take 10 mg by mouth daily.      Marland Kitchen aspirin 81 MG tablet Take 81 mg by mouth daily.      Marland Kitchen atenolol (TENORMIN) 50 MG tablet Take 75 mg by mouth daily.      Marland Kitchen atorvastatin (LIPITOR) 20 MG tablet Take 20 mg by mouth at bedtime.       . calcium-vitamin D (OSCAL WITH D) 500-200 MG-UNIT per tablet Take 1 tablet by mouth daily with breakfast.      . clobetasol (OLUX) 0.05 % topical foam Apply to scalp daily as needed      . cloNIDine (CATAPRES - DOSED IN MG/24 HR) 0.1 mg/24hr patch Place 0.1 mg onto the skin once a week.      . docusate sodium (COLACE) 100 MG capsule Take 100 mg by mouth 2 (two) times daily.       . fexofenadine (ALLEGRA) 180 MG tablet Take 180 mg by  mouth daily. During allergy seasons.      Marland Kitchen FLUOCINOLONE ACETONIDE BODY 0.01 % OIL Apply 1 application topically at bedtime. ON SCALP      . fluocinonide ointment (LIDEX) 0.05 % Apply to affected areas of the body daily as needed. Not to face.      . folic acid (FOLVITE) 1 MG tablet Take 1 mg by mouth daily.      . furosemide (LASIX) 40 MG tablet Take 1 tablet (40 mg total) by mouth daily.  30 tablet  3  . gabapentin (NEURONTIN) 300 MG capsule Take 300 mg by mouth 4 (four) times daily.       . hydrALAZINE (APRESOLINE) 25 MG tablet Take 25 mg by mouth 3 (three) times daily.      Marland Kitchen HYDROcodone-acetaminophen (NORCO/VICODIN) 5-325 MG per tablet Take 1 tablet by mouth 2 (two) times daily as needed for moderate pain.      Marland Kitchen insulin aspart (NOVOLOG) 100 UNIT/ML injection SLIDING SCALE 0-9 Units, Injected subcutaneous, 3 times daily with meals. CBG < 70: implement hypoglycemia protocol CBG 70 - 120: 0 units CBG 121 - 150: 1 unit CBG 151 - 200: 2 units CBG 201 - 250: 3 units CBG 251 - 300: 5 units CBG 301 - 350: 7 units CBG 351 -  400: 9 units CBG > 400: call MD      . insulin glargine (LANTUS) 100 UNIT/ML injection Inject 0.3 mLs (30 Units total) into the skin daily.  10 mL  12  . isosorbide mononitrate (IMDUR) 60 MG 24 hr tablet Take 1.5 tablets (90 mg total) by mouth daily.  45 tablet  6  . methocarbamol (ROBAXIN) 750 MG tablet Take 1 po QID for muscle pain  40 tablet  0  . methotrexate (RHEUMATREX) 2.5 MG tablet Take 7.5 mg by mouth every Wednesday. Caution:Chemotherapy. Protect from light. Takes on Wednesday.      . montelukast (SINGULAIR) 10 MG tablet Take 10 mg by mouth daily.      . nitroGLYCERIN (NITROSTAT) 0.4 MG SL tablet Place 1 tablet (0.4 mg total) under the tongue every 5 (five) minutes as needed for chest pain.  25 tablet  3  . pantoprazole (PROTONIX) 40 MG tablet Take 40 mg by mouth daily.       . polyethylene glycol (MIRALAX / GLYCOLAX) packet Take 17 g by mouth daily as needed for  moderate constipation.      . potassium chloride SA (K-DUR,KLOR-CON) 20 MEQ tablet Take 20 mEq by mouth daily.      Marland Kitchen spironolactone (ALDACTONE) 25 MG tablet Take 1 tablet (25 mg total) by mouth daily.  30 tablet  1  . traMADol (ULTRAM) 50 MG tablet Take 2 tablets (100 mg total) by mouth every 6 (six) hours as needed.  16 tablet  0   No current facility-administered medications for this visit.    Past Medical History  Diagnosis Date  . Hypertension   . Nephrolithiasis   . GERD (gastroesophageal reflux disease)   . Arthritis   . Psoriasis   . Morbid obesity   . Mitral regurgitation   . Hyperlipidemia   . Sleep apnea     uses cpap  . CHF (congestive heart failure)     Diastolic. EF 55-65% by cath 10/11/11  . Diabetes mellitus     insulin dependent  . Acute renal insufficiency     09/2011. not on ACEI secondary to this (also has renal artery stenosis)  . Peripheral vascular disease     severe right renal artery stenosis and left SFA stenosis by PV angio 09/2011, treated medically  . Cellulitis   . CAD (coronary artery disease)     NSTEMI 09/2011 felt poor candidate for CABG, instead s/p PTCA/DES to mid RCA 10/17/11  . Chronic respiratory failure   . Pulmonary hypertension   . Obstructive sleep apnea   . CKD (chronic kidney disease) stage 2, GFR 60-89 ml/min 07/11/2013    Past Surgical History  Procedure Laterality Date  . Acromio-clavicular joint repair  2011  . Abdominal hysterectomy    . Eye surgery      cataract  OD    History   Social History  . Marital Status: Divorced    Spouse Name: N/A    Number of Children: N/A  . Years of Education: N/A   Occupational History  . Not on file.   Social History Main Topics  . Smoking status: Never Smoker   . Smokeless tobacco: Never Used  . Alcohol Use: No  . Drug Use: No  . Sexual Activity: No   Other Topics Concern  . Not on file   Social History Narrative  . No narrative on file     Filed Vitals:   02/07/14  1403  BP: 146/69  Pulse: 54  Height: 5\' 4"  (1.626 m)  Weight: 213 lb (96.616 kg)  SpO2: 100%    PHYSICAL EXAM General: NAD, using oxygen by nasal cannula Neck: No JVD, no thyromegaly.  Lungs: Clear to auscultation bilaterally with normal respiratory effort.  CV: Nondisplaced PMI. Regular rate and rhythm, normal S1/S2, no S3/S4, soft I/VI systolic murmur along left sternal border. No pretibial or periankle edema. No carotid bruit. Normal pedal pulses.  Abdomen: Soft, nontender, no hepatosplenomegaly, no distention.  Neurologic: Alert and oriented x 3.  Psych: Normal affect.  Extremities: No clubbing or cyanosis.   ECG: reviewed and available in electronic records.      ASSESSMENT AND PLAN: 1. Chronic diastolic heart failure: Compensated and euvolemic. Continue Lasix 40mg  po daily, along with hydralazine and spironolactone. BP reasonably well controlled for age.  2. Hypertension: Reasonably controlled for age on present therapy. Will continue hydralazine 25 mg tid, along with spironolactone 25 mg daily, amlodipine 10 mg daily and atenolol 75 mg daily. She is not on an ACEI reportedly because of severe right renal artery stenosis.  3. CAD: Symptomatically stable. S/P PCI to RCA in 2013, with cath demonstrating 2-vessel disease in the RCA and with severe left circumflex disease (serial 90-95% mid LCx lesions by cath on 10-11-2011). Repeat nuclear myocardial perfusion study stress was normal in November of 2014. Continue ASA, BB, statin, as well as Imdur 90 mg daily.  4. OSA: She has been compliant with CPAP.  5. PVD: Severe right renal artery stenosis and left SFA stenosis by PV angio 09/2011.  6. Hyperlipidemia: on Lipitor 20 mg daily.   Dispo: f/u in October 2015.  Kate Sable, M.D., F.A.C.C.

## 2014-02-07 NOTE — Patient Instructions (Signed)
Continue all current medications. Your physician wants you to follow up in:  4 months.  You will receive a reminder letter in the mail one-two months in advance.  If you don't receive a letter, please call our office to schedule the follow up appointment   

## 2014-03-10 ENCOUNTER — Other Ambulatory Visit: Payer: Self-pay | Admitting: Cardiovascular Disease

## 2014-05-08 ENCOUNTER — Other Ambulatory Visit: Payer: Self-pay | Admitting: Cardiovascular Disease

## 2014-05-26 ENCOUNTER — Ambulatory Visit (INDEPENDENT_AMBULATORY_CARE_PROVIDER_SITE_OTHER): Payer: Medicare Other | Admitting: Cardiovascular Disease

## 2014-05-26 ENCOUNTER — Encounter: Payer: Self-pay | Admitting: Cardiovascular Disease

## 2014-05-26 VITALS — BP 138/74 | HR 60 | Ht 64.0 in | Wt 219.0 lb

## 2014-05-26 DIAGNOSIS — I209 Angina pectoris, unspecified: Secondary | ICD-10-CM

## 2014-05-26 DIAGNOSIS — N189 Chronic kidney disease, unspecified: Secondary | ICD-10-CM

## 2014-05-26 DIAGNOSIS — I10 Essential (primary) hypertension: Secondary | ICD-10-CM

## 2014-05-26 DIAGNOSIS — I2510 Atherosclerotic heart disease of native coronary artery without angina pectoris: Secondary | ICD-10-CM

## 2014-05-26 DIAGNOSIS — I5032 Chronic diastolic (congestive) heart failure: Secondary | ICD-10-CM

## 2014-05-26 DIAGNOSIS — I739 Peripheral vascular disease, unspecified: Secondary | ICD-10-CM

## 2014-05-26 DIAGNOSIS — I25119 Atherosclerotic heart disease of native coronary artery with unspecified angina pectoris: Secondary | ICD-10-CM

## 2014-05-26 DIAGNOSIS — G4733 Obstructive sleep apnea (adult) (pediatric): Secondary | ICD-10-CM

## 2014-05-26 DIAGNOSIS — E785 Hyperlipidemia, unspecified: Secondary | ICD-10-CM

## 2014-05-26 MED ORDER — RANOLAZINE ER 500 MG PO TB12: 500.0000 mg | ORAL_TABLET | Freq: Two times a day (BID) | ORAL | Status: DC

## 2014-05-26 MED ORDER — HYDRALAZINE HCL 50 MG PO TABS
50.0000 mg | ORAL_TABLET | Freq: Three times a day (TID) | ORAL | Status: DC
Start: 2014-05-26 — End: 2014-07-14

## 2014-05-26 NOTE — Patient Instructions (Signed)
   Stop Imdur  Change to Ranexa 500mg  twice a day - samples provided & new sent to pharm  Stop Clonidine  Increase Hydralazine to 50mg  three times per day - new sent to pharm Continue all other medications.   Follow up in  3 months

## 2014-05-26 NOTE — Progress Notes (Signed)
Patient ID: Theresa Mclean, female   DOB: 24-Dec-1928, 80 y.o.   MRN: [id number]      SUBJECTIVE: The patient is here to followup on multiple cardiovascular issues, which include chronic diastolic heart failure, hypertension, coronary artery disease with previous percutaneous coronary intervention, and hyperlipidemia. She has chronic respiratory failure and uses 3 L of oxygen at home. She has obstructive sleep apnea and has been compliant with CPAP at home. She was hospitalized for acute diastolic heart failure last December, but has not been hospitalized since then. An echocardiogram in October 2014 showed normal left ventricular systolic function with an ejection fraction of 60-65% with grade 2 diastolic dysfunction.   She has been having episodic chest pain, relieved with 2 SL nitro on one occasion. She has had left infraaxillary discomfort at times as well. She sometimes forgets to use the clonidine patch.  Children: Lives with son [name], also my patient) and has daughter Thayer Headings).  Review of Systems: As per "subjective", otherwise negative.  No Known Allergies  Current Outpatient Prescriptions  Medication Sig Dispense Refill  . acitretin (SORIATANE) 10 MG capsule Take 10 mg by mouth daily with supper.      Marland Kitchen amLODipine (NORVASC) 10 MG tablet Take 10 mg by mouth daily.      Marland Kitchen aspirin 81 MG tablet Take 81 mg by mouth daily.      Marland Kitchen atenolol (TENORMIN) 50 MG tablet Take 75 mg by mouth daily.      Marland Kitchen atorvastatin (LIPITOR) 20 MG tablet Take 20 mg by mouth at bedtime.       . calcium-vitamin D (OSCAL WITH D) 500-200 MG-UNIT per tablet Take 1 tablet by mouth daily with breakfast.      . clobetasol (OLUX) 0.05 % topical foam Apply to scalp daily as needed      . cloNIDine (CATAPRES - DOSED IN MG/24 HR) 0.1 mg/24hr patch Place 0.1 mg onto the skin once a week.      . docusate sodium (COLACE) 100 MG capsule Take 100 mg by mouth 2 (two) times daily.       . fexofenadine (ALLEGRA) 180 MG tablet  Take 180 mg by mouth daily. During allergy seasons.      Marland Kitchen FLUOCINOLONE ACETONIDE BODY 0.01 % OIL Apply 1 application topically at bedtime. ON SCALP      . folic acid (FOLVITE) 1 MG tablet Take 1 mg by mouth daily.      . furosemide (LASIX) 40 MG tablet TAKE 1 TABLET BY MOUTH DAILY.  30 tablet  6  . gabapentin (NEURONTIN) 300 MG capsule Take 300 mg by mouth 4 (four) times daily.       . hydrALAZINE (APRESOLINE) 25 MG tablet Take 25 mg by mouth 3 (three) times daily.      Marland Kitchen HYDROcodone-acetaminophen (NORCO) 7.5-325 MG per tablet Take 1 tablet by mouth every 6 (six) hours as needed for moderate pain.      Marland Kitchen insulin aspart (NOVOLOG) 100 UNIT/ML injection SLIDING SCALE 0-9 Units, Injected subcutaneous, 3 times daily with meals. CBG < 70: implement hypoglycemia protocol CBG 70 - 120: 0 units CBG 121 - 150: 1 unit CBG 151 - 200: 2 units CBG 201 - 250: 3 units CBG 251 - 300: 5 units CBG 301 - 350: 7 units CBG 351 - 400: 9 units CBG > 400: call MD      . insulin glargine (LANTUS) 100 UNIT/ML injection Inject 50 Units into the skin daily.      Marland Kitchen  isosorbide mononitrate (IMDUR) 60 MG 24 hr tablet TAKE 1 AND 1/2 TABLETS BY MOUTH DAILY.  45 tablet  6  . methocarbamol (ROBAXIN) 750 MG tablet Take 1 po QID for muscle pain  40 tablet  0  . methotrexate (RHEUMATREX) 2.5 MG tablet Take 7.5 mg by mouth every Wednesday. Caution:Chemotherapy. Protect from light. Takes on Wednesday.      . montelukast (SINGULAIR) 10 MG tablet Take 10 mg by mouth daily.      . nitroGLYCERIN (NITROSTAT) 0.4 MG SL tablet Place 1 tablet (0.4 mg total) under the tongue every 5 (five) minutes as needed for chest pain.  25 tablet  3  . pantoprazole (PROTONIX) 40 MG tablet Take 40 mg by mouth daily.       . polyethylene glycol (MIRALAX / GLYCOLAX) packet Take 17 g by mouth daily as needed for moderate constipation.      . potassium chloride SA (K-DUR,KLOR-CON) 20 MEQ tablet Take 20 mEq by mouth daily.      Marland Kitchen spironolactone (ALDACTONE)  25 MG tablet Take 1 tablet (25 mg total) by mouth daily.  30 tablet  1   No current facility-administered medications for this visit.    Past Medical History  Diagnosis Date  . Hypertension   . Nephrolithiasis   . GERD (gastroesophageal reflux disease)   . Arthritis   . Psoriasis   . Morbid obesity   . Mitral regurgitation   . Hyperlipidemia   . Sleep apnea     uses cpap  . CHF (congestive heart failure)     Diastolic. EF 55-65% by cath 10/11/11  . Diabetes mellitus     insulin dependent  . Acute renal insufficiency     09/2011. not on ACEI secondary to this (also has renal artery stenosis)  . Peripheral vascular disease     severe right renal artery stenosis and left SFA stenosis by PV angio 09/2011, treated medically  . Cellulitis   . CAD (coronary artery disease)     NSTEMI 09/2011 felt poor candidate for CABG, instead s/p PTCA/DES to mid RCA 10/17/11  . Chronic respiratory failure   . Pulmonary hypertension   . Obstructive sleep apnea   . CKD (chronic kidney disease) stage 2, GFR 60-89 ml/min 07/11/2013    Past Surgical History  Procedure Laterality Date  . Acromio-clavicular joint repair  2011  . Abdominal hysterectomy    . Eye surgery      cataract  OD    History   Social History  . Marital Status: Divorced    Spouse Name: N/A    Number of Children: N/A  . Years of Education: N/A   Occupational History  . Not on file.   Social History Main Topics  . Smoking status: Never Smoker   . Smokeless tobacco: Never Used  . Alcohol Use: No  . Drug Use: No  . Sexual Activity: No   Other Topics Concern  . Not on file   Social History Narrative  . No narrative on file     Filed Vitals:   05/26/14 1019  BP: 138/74  Pulse: 60  Height: 5\' 4"  (1.626 m)  Weight: 219 lb (99.338 kg)  SpO2: 99%    PHYSICAL EXAM General: NAD, using oxygen by nasal cannula  Neck: No JVD, no thyromegaly.  Lungs: Clear to auscultation bilaterally with normal respiratory effort.   CV: Nondisplaced PMI. Regular rate and rhythm, normal S1/S2, no S3/S4, soft I/VI systolic murmur along left sternal border. No  pretibial or periankle edema. No carotid bruit.  Abdomen: Soft, nontender,no distention.  Neurologic: Alert and oriented. Psych: Normal affect.  Skin: Normal. Musculoskeletal: No gross deformities. Extremities: No clubbing or cyanosis.   ECG: Most recent ECG reviewed.      ASSESSMENT AND PLAN: 1. Chronic diastolic heart failure: Compensated and euvolemic. Continue Lasix 40mg  po daily, along with hydralazine and spironolactone. BP well controlled. As I am stopping clonidine patch, I will increase hydralazine to 50 mg tid. 2. Essential hypertension: Well controlled on present therapy. As I am stopping clonidine patch, I will increase hydralazine to 50 mg tid and continue spironolactone 25 mg daily, amlodipine 10 mg daily and atenolol 75 mg daily. She is not on an ACEI reportedly because of severe right renal artery stenosis.  3. CAD: Due to episodic chest pain and possible development of nitrate tolerance, I will stop Imdur and start Ranexa 500 mg bid. Has h/o PCI to RCA in 2013, with cath demonstrating 2-vessel disease in the RCA and with severe left circumflex disease (serial 90-95% mid LCx lesions by cath on 10-11-2011). Repeat nuclear myocardial perfusion study stress was normal in November of 2014. Continue ASA, atenolol and atorvastatin. 4. OSA: She has been compliant with CPAP.  5. PVD: Severe right renal artery stenosis and left SFA stenosis by PV angio 09/2011.  6. Hyperlipidemia: On Lipitor 20 mg daily.   Dispo: f/u 3 months.  Kate Sable, M.D., F.A.C.C.

## 2014-06-18 ENCOUNTER — Telehealth: Payer: Self-pay | Admitting: *Deleted

## 2014-06-18 NOTE — Telephone Encounter (Signed)
Patient c/o nausea since starting ranexa. Patient said she has been taking this on an empty stomach. Nurse advised patient to try it with food to see if her symptoms improve. Patient advised to call our office back if her symptoms don't improve.

## 2014-07-08 ENCOUNTER — Inpatient Hospital Stay (HOSPITAL_COMMUNITY)
Admission: AD | Admit: 2014-07-08 | Discharge: 2014-07-14 | DRG: 391 | Disposition: A | Discharge status: Home Health | Payer: Medicare Other | Source: Other Acute Inpatient Hospital | Attending: Internal Medicine | Admitting: Internal Medicine

## 2014-07-08 ENCOUNTER — Inpatient Hospital Stay (HOSPITAL_COMMUNITY): Payer: Medicare Other

## 2014-07-08 DIAGNOSIS — I252 Old myocardial infarction: Secondary | ICD-10-CM | POA: Diagnosis not present

## 2014-07-08 DIAGNOSIS — G4733 Obstructive sleep apnea (adult) (pediatric): Secondary | ICD-10-CM | POA: Diagnosis present

## 2014-07-08 DIAGNOSIS — Z6837 Body mass index (BMI) 37.0-37.9, adult: Secondary | ICD-10-CM

## 2014-07-08 DIAGNOSIS — Z7982 Long term (current) use of aspirin: Secondary | ICD-10-CM

## 2014-07-08 DIAGNOSIS — I739 Peripheral vascular disease, unspecified: Secondary | ICD-10-CM | POA: Diagnosis present

## 2014-07-08 DIAGNOSIS — I10 Essential (primary) hypertension: Secondary | ICD-10-CM

## 2014-07-08 DIAGNOSIS — I129 Hypertensive chronic kidney disease with stage 1 through stage 4 chronic kidney disease, or unspecified chronic kidney disease: Secondary | ICD-10-CM | POA: Diagnosis present

## 2014-07-08 DIAGNOSIS — IMO0002 Reserved for concepts with insufficient information to code with codable children: Secondary | ICD-10-CM

## 2014-07-08 DIAGNOSIS — I5031 Acute diastolic (congestive) heart failure: Secondary | ICD-10-CM

## 2014-07-08 DIAGNOSIS — R079 Chest pain, unspecified: Secondary | ICD-10-CM

## 2014-07-08 DIAGNOSIS — I701 Atherosclerosis of renal artery: Secondary | ICD-10-CM | POA: Diagnosis present

## 2014-07-08 DIAGNOSIS — N182 Chronic kidney disease, stage 2 (mild): Secondary | ICD-10-CM

## 2014-07-08 DIAGNOSIS — Z79899 Other long term (current) drug therapy: Secondary | ICD-10-CM | POA: Diagnosis not present

## 2014-07-08 DIAGNOSIS — I5041 Acute combined systolic (congestive) and diastolic (congestive) heart failure: Secondary | ICD-10-CM

## 2014-07-08 DIAGNOSIS — I272 Other secondary pulmonary hypertension: Secondary | ICD-10-CM | POA: Diagnosis present

## 2014-07-08 DIAGNOSIS — R109 Unspecified abdominal pain: Secondary | ICD-10-CM

## 2014-07-08 DIAGNOSIS — K529 Noninfective gastroenteritis and colitis, unspecified: Secondary | ICD-10-CM | POA: Diagnosis present

## 2014-07-08 DIAGNOSIS — I5043 Acute on chronic combined systolic (congestive) and diastolic (congestive) heart failure: Secondary | ICD-10-CM | POA: Diagnosis present

## 2014-07-08 DIAGNOSIS — I2510 Atherosclerotic heart disease of native coronary artery without angina pectoris: Secondary | ICD-10-CM | POA: Diagnosis present

## 2014-07-08 DIAGNOSIS — K219 Gastro-esophageal reflux disease without esophagitis: Secondary | ICD-10-CM | POA: Diagnosis present

## 2014-07-08 DIAGNOSIS — Z95818 Presence of other cardiac implants and grafts: Secondary | ICD-10-CM

## 2014-07-08 DIAGNOSIS — R001 Bradycardia, unspecified: Secondary | ICD-10-CM | POA: Diagnosis present

## 2014-07-08 DIAGNOSIS — E785 Hyperlipidemia, unspecified: Secondary | ICD-10-CM | POA: Diagnosis present

## 2014-07-08 DIAGNOSIS — E1165 Type 2 diabetes mellitus with hyperglycemia: Secondary | ICD-10-CM

## 2014-07-08 DIAGNOSIS — E875 Hyperkalemia: Secondary | ICD-10-CM | POA: Diagnosis present

## 2014-07-08 DIAGNOSIS — I5032 Chronic diastolic (congestive) heart failure: Secondary | ICD-10-CM

## 2014-07-08 DIAGNOSIS — J9610 Chronic respiratory failure, unspecified whether with hypoxia or hypercapnia: Secondary | ICD-10-CM | POA: Diagnosis present

## 2014-07-08 DIAGNOSIS — N189 Chronic kidney disease, unspecified: Secondary | ICD-10-CM | POA: Insufficient documentation

## 2014-07-08 DIAGNOSIS — R519 Headache, unspecified: Secondary | ICD-10-CM

## 2014-07-08 DIAGNOSIS — N184 Chronic kidney disease, stage 4 (severe): Secondary | ICD-10-CM | POA: Diagnosis present

## 2014-07-08 DIAGNOSIS — Z794 Long term (current) use of insulin: Secondary | ICD-10-CM | POA: Diagnosis not present

## 2014-07-08 DIAGNOSIS — N183 Chronic kidney disease, stage 3 (moderate): Secondary | ICD-10-CM

## 2014-07-08 DIAGNOSIS — I5033 Acute on chronic diastolic (congestive) heart failure: Secondary | ICD-10-CM

## 2014-07-08 DIAGNOSIS — O26899 Other specified pregnancy related conditions, unspecified trimester: Secondary | ICD-10-CM

## 2014-07-08 DIAGNOSIS — Z9119 Patient's noncompliance with other medical treatment and regimen: Secondary | ICD-10-CM | POA: Diagnosis present

## 2014-07-08 DIAGNOSIS — I509 Heart failure, unspecified: Secondary | ICD-10-CM

## 2014-07-08 DIAGNOSIS — E119 Type 2 diabetes mellitus without complications: Secondary | ICD-10-CM

## 2014-07-08 DIAGNOSIS — E1140 Type 2 diabetes mellitus with diabetic neuropathy, unspecified: Secondary | ICD-10-CM | POA: Diagnosis present

## 2014-07-08 DIAGNOSIS — R51 Headache: Secondary | ICD-10-CM

## 2014-07-08 DIAGNOSIS — E1129 Type 2 diabetes mellitus with other diabetic kidney complication: Secondary | ICD-10-CM

## 2014-07-08 DIAGNOSIS — I214 Non-ST elevation (NSTEMI) myocardial infarction: Secondary | ICD-10-CM

## 2014-07-08 DIAGNOSIS — I340 Nonrheumatic mitral (valve) insufficiency: Secondary | ICD-10-CM

## 2014-07-08 MED ORDER — RANOLAZINE ER 500 MG PO TB12
500.0000 mg | ORAL_TABLET | Freq: Two times a day (BID) | ORAL | Status: DC
  Administered 2014-07-08 – 2014-07-14 (×11): 500 mg via ORAL
  Filled 2014-07-08 (×14): qty 1

## 2014-07-08 MED ORDER — SODIUM CHLORIDE 0.9 % IJ SOLN
3.0000 mL | Freq: Two times a day (BID) | INTRAMUSCULAR | Status: DC
  Administered 2014-07-08 – 2014-07-14 (×8): 3 mL via INTRAVENOUS

## 2014-07-08 MED ORDER — SODIUM CHLORIDE 0.9 % IJ SOLN
3.0000 mL | INTRAMUSCULAR | Status: DC | PRN
Start: 2014-07-08 — End: 2014-07-11

## 2014-07-08 MED ORDER — ONDANSETRON HCL 4 MG PO TABS: 4.0000 mg | ORAL_TABLET | Freq: Four times a day (QID) | ORAL | Status: DC | PRN

## 2014-07-08 MED ORDER — HYDRALAZINE HCL 50 MG PO TABS
50.0000 mg | ORAL_TABLET | Freq: Three times a day (TID) | ORAL | Status: DC
  Administered 2014-07-08 – 2014-07-09 (×2): 50 mg via ORAL
  Filled 2014-07-08 (×4): qty 1

## 2014-07-08 MED ORDER — METHOTREXATE 2.5 MG PO TABS
7.5000 mg | ORAL_TABLET | ORAL | Status: DC
  Administered 2014-07-09: 7.5 mg via ORAL
  Filled 2014-07-08: qty 3

## 2014-07-08 MED ORDER — MORPHINE SULFATE 2 MG/ML IJ SOLN
1.0000 mg | INTRAMUSCULAR | Status: DC | PRN
  Administered 2014-07-09 (×2): 1 mg via INTRAVENOUS
  Filled 2014-07-08 (×2): qty 1

## 2014-07-08 MED ORDER — GABAPENTIN 300 MG PO CAPS
300.0000 mg | ORAL_CAPSULE | Freq: Four times a day (QID) | ORAL | Status: DC
  Administered 2014-07-08 – 2014-07-10 (×7): 300 mg via ORAL
  Filled 2014-07-08 (×13): qty 1

## 2014-07-08 MED ORDER — CLOBETASOL PROPIONATE 0.05 % EX FOAM: Freq: Two times a day (BID) | CUTANEOUS | Status: DC

## 2014-07-08 MED ORDER — ONDANSETRON HCL 4 MG/2ML IJ SOLN: 4.0000 mg | Freq: Four times a day (QID) | INTRAMUSCULAR | Status: DC | PRN

## 2014-07-08 MED ORDER — PANTOPRAZOLE SODIUM 40 MG PO TBEC
40.0000 mg | DELAYED_RELEASE_TABLET | Freq: Every day | ORAL | Status: DC
Start: 2014-07-09 — End: 2014-07-14
  Administered 2014-07-09 – 2014-07-14 (×5): 40 mg via ORAL
  Filled 2014-07-08 (×5): qty 1

## 2014-07-08 MED ORDER — MONTELUKAST SODIUM 10 MG PO TABS
10.0000 mg | ORAL_TABLET | Freq: Every day | ORAL | Status: DC
  Administered 2014-07-09 – 2014-07-14 (×5): 10 mg via ORAL
  Filled 2014-07-08 (×6): qty 1

## 2014-07-08 MED ORDER — CALCIUM CARBONATE-VITAMIN D 500-200 MG-UNIT PO TABS
1.0000 | ORAL_TABLET | Freq: Every day | ORAL | Status: DC
  Administered 2014-07-09 – 2014-07-14 (×4): 1 via ORAL
  Filled 2014-07-08 (×8): qty 1

## 2014-07-08 MED ORDER — OXYCODONE HCL 5 MG PO TABS: 5.0000 mg | ORAL_TABLET | ORAL | Status: DC | PRN

## 2014-07-08 MED ORDER — ADULT MULTIVITAMIN W/MINERALS CH
1.0000 | ORAL_TABLET | Freq: Every day | ORAL | Status: DC
  Administered 2014-07-09 – 2014-07-14 (×5): 1 via ORAL
  Filled 2014-07-08 (×6): qty 1

## 2014-07-08 MED ORDER — SODIUM CHLORIDE 0.9 % IJ SOLN
3.0000 mL | Freq: Two times a day (BID) | INTRAMUSCULAR | Status: DC
  Administered 2014-07-09 – 2014-07-11 (×4): 3 mL via INTRAVENOUS

## 2014-07-08 MED ORDER — ASPIRIN EC 81 MG PO TBEC
81.0000 mg | DELAYED_RELEASE_TABLET | Freq: Every day | ORAL | Status: DC
  Administered 2014-07-09 – 2014-07-14 (×5): 81 mg via ORAL
  Filled 2014-07-08 (×6): qty 1

## 2014-07-08 MED ORDER — AMLODIPINE BESYLATE 10 MG PO TABS
10.0000 mg | ORAL_TABLET | Freq: Every day | ORAL | Status: DC
  Administered 2014-07-09 – 2014-07-14 (×5): 10 mg via ORAL
  Filled 2014-07-08 (×6): qty 1

## 2014-07-08 MED ORDER — METHOCARBAMOL 500 MG PO TABS
750.0000 mg | ORAL_TABLET | Freq: Three times a day (TID) | ORAL | Status: DC | PRN
  Administered 2014-07-10: 750 mg via ORAL
  Filled 2014-07-08 (×2): qty 1

## 2014-07-08 MED ORDER — SODIUM CHLORIDE 0.9 % IV SOLN: 250.0000 mL | INTRAVENOUS | Status: DC | PRN

## 2014-07-08 MED ORDER — ACETAMINOPHEN 650 MG RE SUPP: 650.0000 mg | Freq: Four times a day (QID) | RECTAL | Status: DC | PRN

## 2014-07-08 MED ORDER — INSULIN GLARGINE 100 UNIT/ML ~~LOC~~ SOLN
50.0000 [IU] | Freq: Every day | SUBCUTANEOUS | Status: DC
  Filled 2014-07-08: qty 0.5

## 2014-07-08 MED ORDER — VITAMIN B-1 100 MG PO TABS
100.0000 mg | ORAL_TABLET | Freq: Every day | ORAL | Status: DC
  Administered 2014-07-09 – 2014-07-14 (×5): 100 mg via ORAL
  Filled 2014-07-08 (×6): qty 1

## 2014-07-08 MED ORDER — FOLIC ACID 1 MG PO TABS
1.0000 mg | ORAL_TABLET | Freq: Every day | ORAL | Status: DC
  Administered 2014-07-09 – 2014-07-14 (×5): 1 mg via ORAL
  Filled 2014-07-08 (×6): qty 1

## 2014-07-08 MED ORDER — SENNA 8.6 MG PO TABS
1.0000 | ORAL_TABLET | Freq: Two times a day (BID) | ORAL | Status: DC
  Administered 2014-07-08 – 2014-07-14 (×11): 8.6 mg via ORAL
  Filled 2014-07-08 (×13): qty 1

## 2014-07-08 MED ORDER — INSULIN ASPART 100 UNIT/ML ~~LOC~~ SOLN
0.0000 [IU] | Freq: Three times a day (TID) | SUBCUTANEOUS | Status: DC
  Administered 2014-07-09 (×3): 5 [IU] via SUBCUTANEOUS
  Administered 2014-07-10 – 2014-07-11 (×4): 3 [IU] via SUBCUTANEOUS
  Administered 2014-07-11: 5 [IU] via SUBCUTANEOUS
  Administered 2014-07-11 – 2014-07-13 (×5): 3 [IU] via SUBCUTANEOUS
  Administered 2014-07-13: 5 [IU] via SUBCUTANEOUS
  Administered 2014-07-13: 2 [IU] via SUBCUTANEOUS
  Administered 2014-07-14: 8 [IU] via SUBCUTANEOUS
  Administered 2014-07-14: 3 [IU] via SUBCUTANEOUS

## 2014-07-08 MED ORDER — ACITRETIN 10 MG PO CAPS: 10.0000 mg | ORAL_CAPSULE | Freq: Every day | ORAL | Status: DC

## 2014-07-08 MED ORDER — POLYETHYLENE GLYCOL 3350 17 G PO PACK
17.0000 g | PACK | Freq: Every day | ORAL | Status: DC | PRN
  Filled 2014-07-08: qty 1

## 2014-07-08 MED ORDER — ACETAMINOPHEN 325 MG PO TABS
650.0000 mg | ORAL_TABLET | Freq: Four times a day (QID) | ORAL | Status: DC | PRN
Start: 2014-07-08 — End: 2014-07-14

## 2014-07-08 MED ORDER — BISACODYL 10 MG RE SUPP: 10.0000 mg | Freq: Every day | RECTAL | Status: DC | PRN

## 2014-07-08 MED ORDER — HYDROCODONE-ACETAMINOPHEN 7.5-325 MG PO TABS: 1.0000 | ORAL_TABLET | Freq: Four times a day (QID) | ORAL | Status: DC | PRN

## 2014-07-08 MED ORDER — NITROGLYCERIN 0.4 MG SL SUBL: 0.4000 mg | SUBLINGUAL_TABLET | SUBLINGUAL | Status: DC | PRN

## 2014-07-08 MED ORDER — ATORVASTATIN CALCIUM 20 MG PO TABS
20.0000 mg | ORAL_TABLET | Freq: Every day | ORAL | Status: DC
  Administered 2014-07-08 – 2014-07-13 (×6): 20 mg via ORAL
  Filled 2014-07-08 (×8): qty 1

## 2014-07-08 MED ORDER — HEPARIN SODIUM (PORCINE) 5000 UNIT/ML IJ SOLN
5000.0000 [IU] | Freq: Three times a day (TID) | INTRAMUSCULAR | Status: DC
  Administered 2014-07-08 – 2014-07-14 (×18): 5000 [IU] via SUBCUTANEOUS
  Filled 2014-07-08 (×20): qty 1

## 2014-07-08 MED ORDER — LORATADINE 10 MG PO TABS
10.0000 mg | ORAL_TABLET | Freq: Every day | ORAL | Status: DC
  Administered 2014-07-09 – 2014-07-10 (×2): 10 mg via ORAL
  Filled 2014-07-08 (×3): qty 1

## 2014-07-08 NOTE — H&P (Signed)
[facility] History and Physical  Theresa H Daloia JJO:[id number] DOB: July 04, 1929 DOA: 07/08/2014  Referring physician: Jerene Bears, MD PCP: Glenda Chroman., MD   Chief Complaint: Not feeling well  HPI: Theresa Mclean is a 80 y.o. female with multiple medical problems presents as a transfer from [facility]. Patient presented to [facility] not feeling well. She was noted to have bradycardia and also was noted to have hyperkalemia. Patient states that she has been having some vague abdominal pain. She has no diarrhea no vomiting. She denies having any chest pain. She states there is no headache noted. She has been feeling very fatigued however. Patient states that she has chronic edema and her left leg has been more swollen than the right side since her knee surgery. Patient had lab work done at [facility] and her initial potassium was 6.1 with a creatinine of 1.53 apparently has a baseline of 0.9 on her creatinine   Review of Systems:  Constitutional:  No weight loss, night sweats, Fevers, chills, ++fatigue.  HEENT:  No headaches, Difficulty swallowing  Cardio-vascular:  No chest pain, Orthopnea, PND, ++swelling in lower extremities  GI:  No heartburn, indigestion, abdominal pain, nausea, vomiting, diarrhea  Resp:  ++shortness of breath with exertion.No wheezing.  Skin:  no rash or lesions GU:  no dysuria, change in color of urine, no urgency or frequency.  Musculoskeletal:  No joint pain or swelling. No decreased range of motion.  Psych:  No change in mood or affect. No depression or anxiety   Past Medical History  Diagnosis Date  . Hypertension   . Nephrolithiasis   . GERD (gastroesophageal reflux disease)   . Arthritis   . Psoriasis   . Morbid obesity   . Mitral regurgitation   . Hyperlipidemia   . Sleep apnea     uses cpap  . CHF (congestive heart failure)     Diastolic. EF 55-65% by cath 10/11/11  . Diabetes mellitus     insulin dependent  . Acute renal  insufficiency     09/2011. not on ACEI secondary to this (also has renal artery stenosis)  . Peripheral vascular disease     severe right renal artery stenosis and left SFA stenosis by PV angio 09/2011, treated medically  . Cellulitis   . CAD (coronary artery disease)     NSTEMI 09/2011 felt poor candidate for CABG, instead s/p PTCA/DES to mid RCA 10/17/11  . Chronic respiratory failure   . Pulmonary hypertension   . Obstructive sleep apnea   . CKD (chronic kidney disease) stage 2, GFR 60-89 ml/min 07/11/2013   Past Surgical History  Procedure Laterality Date  . Acromio-clavicular joint repair  2011  . Abdominal hysterectomy    . Eye surgery      cataract  OD   Social History:  reports that she has never smoked. She has never used smokeless tobacco. She reports that she does not drink alcohol or use illicit drugs.  No Known Allergies  Family History  Problem Relation Age of Onset  . Psoriasis Brother      Prior to Admission medications   Medication Sig Start Date End Date Taking? Authorizing Provider  acitretin (SORIATANE) 10 MG capsule Take 10 mg by mouth daily with supper. 08/01/13   Historical Provider, MD  amLODipine (NORVASC) 10 MG tablet Take 10 mg by mouth daily.    Historical Provider, MD  aspirin 81 MG tablet Take 81 mg by mouth daily.    Historical Provider, MD  atenolol (TENORMIN) 50 MG tablet Take 75 mg by mouth daily. 10/28/11   Donney Dice, PA-C  atorvastatin (LIPITOR) 20 MG tablet Take 20 mg by mouth at bedtime.     Historical Provider, MD  calcium-vitamin D (OSCAL WITH D) 500-200 MG-UNIT per tablet Take 1 tablet by mouth daily with breakfast.    Historical Provider, MD  clobetasol (OLUX) 0.05 % topical foam Apply to scalp daily as needed 04/24/13   Historical Provider, MD  docusate sodium (COLACE) 100 MG capsule Take 100 mg by mouth 2 (two) times daily.     Historical Provider, MD  fexofenadine (ALLEGRA) 180 MG tablet Take 180 mg by mouth daily. During allergy  seasons.    Historical Provider, MD  FLUOCINOLONE ACETONIDE BODY 0.01 % OIL Apply 1 application topically at bedtime. ON SCALP 08/01/13   Historical Provider, MD  folic acid (FOLVITE) 1 MG tablet Take 1 mg by mouth daily.    Historical Provider, MD  furosemide (LASIX) 40 MG tablet TAKE 1 TABLET BY MOUTH DAILY. 03/10/14   Herminio Commons, MD  gabapentin (NEURONTIN) 300 MG capsule Take 300 mg by mouth 4 (four) times daily.     Historical Provider, MD  hydrALAZINE (APRESOLINE) 50 MG tablet Take 1 tablet (50 mg total) by mouth 3 (three) times daily. 05/26/14   Herminio Commons, MD  HYDROcodone-acetaminophen (NORCO) 7.5-325 MG per tablet Take 1 tablet by mouth every 6 (six) hours as needed for moderate pain.    Historical Provider, MD  insulin aspart (NOVOLOG) 100 UNIT/ML injection SLIDING SCALE 0-9 Units, Injected subcutaneous, 3 times daily with meals. CBG < 70: implement hypoglycemia protocol CBG 70 - 120: 0 units CBG 121 - 150: 1 unit CBG 151 - 200: 2 units CBG 201 - 250: 3 units CBG 251 - 300: 5 units CBG 301 - 350: 7 units CBG 351 - 400: 9 units CBG > 400: call MD 10/20/11   [name], PA-C  insulin glargine (LANTUS) 100 UNIT/ML injection Inject 50 Units into the skin daily. 08/16/13   Kathie Dike, MD  methocarbamol (ROBAXIN) 750 MG tablet Take 1 po QID for muscle pain 11/24/13   [name], MD  methotrexate (RHEUMATREX) 2.5 MG tablet Take 7.5 mg by mouth every Wednesday. Caution:Chemotherapy. Protect from light. Takes on Wednesday.    Historical Provider, MD  montelukast (SINGULAIR) 10 MG tablet Take 10 mg by mouth daily.    Historical Provider, MD  nitroGLYCERIN (NITROSTAT) 0.4 MG SL tablet Place 1 tablet (0.4 mg total) under the tongue every 5 (five) minutes as needed for chest pain. 07/29/13   Herminio Commons, MD  pantoprazole (PROTONIX) 40 MG tablet Take 40 mg by mouth daily.     Historical Provider, MD  polyethylene glycol (MIRALAX / GLYCOLAX) packet Take 17 g by mouth daily  as needed for moderate constipation. 07/12/13   Rexene Alberts, MD  potassium chloride SA (K-DUR,KLOR-CON) 20 MEQ tablet Take 20 mEq by mouth daily.    Historical Provider, MD  ranolazine (RANEXA) 500 MG 12 hr tablet Take 1 tablet (500 mg total) by mouth 2 (two) times daily. 05/26/14   Herminio Commons, MD  spironolactone (ALDACTONE) 25 MG tablet Take 1 tablet (25 mg total) by mouth daily. 08/16/13   Kathie Dike, MD   Physical Exam: Filed Vitals:   07/08/14 2138  BP: 172/50  Pulse: 52  Temp: 99 F (37.2 C)  TempSrc: Oral  Resp: 24  Height: 5\' 4"  (1.626 m)  Weight: 96.8 kg (213 lb 6.5 oz)  SpO2: 92%    Wt Readings from Last 3 Encounters:  07/08/14 96.8 kg (213 lb 6.5 oz)  05/26/14 99.338 kg (219 lb)  02/07/14 96.616 kg (213 lb)    General:  Appears calm and comfortable Eyes: PERRL, normal lids, irises & conjunctiva ENT: grossly normal hearing, lips & tongue Neck: no LAD, masses or thyromegaly Cardiovascular: RR, bradycardia. ++LE edema. Telemetry: Bradycardia Respiratory:  Normal respiratory effort.++rales Abdomen: soft, lower abdominal tenderness obese Skin: no rash or induration seen on limited exam Musculoskeletal: grossly normal tone Psychiatric: grossly normal mood and affect Neurologic: grossly non-focal.          Labs on Admission:  Basic Metabolic Panel: No results for input(s): NA, K, CL, CO2, GLUCOSE, BUN, CREATININE, CALCIUM, MG, PHOS in the last 168 hours. Liver Function Tests: No results for input(s): AST, ALT, ALKPHOS, BILITOT, PROT, ALBUMIN in the last 168 hours. No results for input(s): LIPASE, AMYLASE in the last 168 hours. No results for input(s): AMMONIA in the last 168 hours. CBC: No results for input(s): WBC, NEUTROABS, HGB, HCT, MCV, PLT in the last 168 hours. Cardiac Enzymes: No results for input(s): CKTOTAL, CKMB, CKMBINDEX, TROPONINI in the last 168 hours.  BNP (last 3 results)  Recent Labs  07/09/13 1937 08/10/13 1556  PROBNP  390.6 378.0   CBG: No results for input(s): GLUCAP in the last 168 hours.  Radiological Exams on Admission: No results found.   Assessment/Plan Active Problems:   Bradycardia   Diabetes   Essential hypertension   Acute combined systolic and diastolic congestive heart failure   Hyperkalemia   Chronic kidney disease (CKD), stage IV (severe)   1. Bradycardia -she will be admitted to stepdown -will hold beta blockers and monitor -will get ECG now -cardiology evaluation  2. Hyperkalemia -will repeat labs  -she was treated at [facility] -stop K supplements -will also hold aldactone -continue with telemetry  3. Diabetes Mellitus -will continue with her home medications -sliding scale ordered  4. CKD Stage III -will repeat labs -multiple reasons including renal artery stenosis by history  5. Hypertension -will continue with home medications -holding beta blockers for now  6. Congestive heart failure -echo ordered -will check enzymes -hold lasix for now -check a CXR    Code Status: Full code (must indicate code status--if unknown or must be presumed, indicate so) DVT Prophylaxis:heparin Family Communication: Daughter  (indicate person spoken with, if applicable, with phone number if by telephone) Disposition Plan: Home (indicate anticipated LOS)  Time spent: 28min  Tylisha Danis A [facility] Pager [phone]

## 2014-07-09 ENCOUNTER — Inpatient Hospital Stay (HOSPITAL_COMMUNITY): Payer: Medicare Other

## 2014-07-09 ENCOUNTER — Encounter (HOSPITAL_COMMUNITY): Payer: Self-pay | Admitting: General Practice

## 2014-07-09 DIAGNOSIS — I359 Nonrheumatic aortic valve disorder, unspecified: Secondary | ICD-10-CM

## 2014-07-09 DIAGNOSIS — I5033 Acute on chronic diastolic (congestive) heart failure: Secondary | ICD-10-CM

## 2014-07-09 DIAGNOSIS — R079 Chest pain, unspecified: Secondary | ICD-10-CM

## 2014-07-09 DIAGNOSIS — I10 Essential (primary) hypertension: Secondary | ICD-10-CM

## 2014-07-09 LAB — CBC
HCT: 34.5 % — ABNORMAL LOW (ref 36.0–46.0)
HCT: 34.4 % — ABNORMAL LOW (ref 36.0–46.0)
Hemoglobin: 10.8 g/dL — ABNORMAL LOW (ref 12.0–15.0)
Hemoglobin: 10.7 g/dL — ABNORMAL LOW (ref 12.0–15.0)
MCH: 24.9 pg — ABNORMAL LOW (ref 26.0–34.0)
MCH: 25.4 pg — AB (ref 26.0–34.0)
MCHC: 31.3 g/dL (ref 30.0–36.0)
MCHC: 31.1 g/dL (ref 30.0–36.0)
MCV: 80.2 fL (ref 78.0–100.0)
MCV: 81 fL (ref 78.0–100.0)
PLATELETS: 212 10*3/uL (ref 150–400)
Platelets: UNDETERMINED 10*3/uL (ref 150–400)
RBC: 4.29 MIL/uL (ref 3.87–5.11)
RBC: 4.26 MIL/uL (ref 3.87–5.11)
RDW: 19.5 % — ABNORMAL HIGH (ref 11.5–15.5)
RDW: 19.7 % — AB (ref 11.5–15.5)
WBC: 12.4 10*3/uL — ABNORMAL HIGH (ref 4.0–10.5)
WBC: 13 10*3/uL — ABNORMAL HIGH (ref 4.0–10.5)

## 2014-07-09 LAB — GLUCOSE, CAPILLARY
GLUCOSE-CAPILLARY: 231 mg/dL — AB (ref 70–99)
Glucose-Capillary: 243 mg/dL — ABNORMAL HIGH (ref 70–99)
Glucose-Capillary: 163 mg/dL — ABNORMAL HIGH (ref 70–99)
Glucose-Capillary: 174 mg/dL — ABNORMAL HIGH (ref 70–99)

## 2014-07-09 LAB — COMPREHENSIVE METABOLIC PANEL
ALT: 24 U/L (ref 0–35)
ALT: 24 U/L (ref 0–35)
ANION GAP: 14 (ref 5–15)
ANION GAP: 16 — AB (ref 5–15)
AST: 15 U/L (ref 0–37)
AST: 15 U/L (ref 0–37)
Albumin: 3.5 g/dL (ref 3.5–5.2)
Albumin: 3.6 g/dL (ref 3.5–5.2)
Alkaline Phosphatase: 69 U/L (ref 39–117)
Alkaline Phosphatase: 66 U/L (ref 39–117)
BUN: 34 mg/dL — ABNORMAL HIGH (ref 6–23)
BUN: 36 mg/dL — AB (ref 6–23)
CO2: 24 mEq/L (ref 19–32)
CO2: 20 meq/L (ref 19–32)
CREATININE: 1.09 mg/dL (ref 0.50–1.10)
Calcium: 10.6 mg/dL — ABNORMAL HIGH (ref 8.4–10.5)
Calcium: 10.8 mg/dL — ABNORMAL HIGH (ref 8.4–10.5)
Chloride: 101 mEq/L (ref 96–112)
Chloride: 103 mEq/L (ref 96–112)
Creatinine, Ser: 0.99 mg/dL (ref 0.50–1.10)
GFR calc Af Amer: 59 mL/min — ABNORMAL LOW (ref >90)
GFR calc non Af Amer: 51 mL/min — ABNORMAL LOW (ref >90)
GFR, EST AFRICAN AMERICAN: 53 mL/min — AB (ref >90)
GFR, EST NON AFRICAN AMERICAN: 46 mL/min — AB (ref >90)
GLUCOSE: 189 mg/dL — AB (ref 70–99)
Glucose, Bld: 204 mg/dL — ABNORMAL HIGH (ref 70–99)
Potassium: 4.3 mEq/L (ref 3.7–5.3)
Potassium: 4 mEq/L (ref 3.7–5.3)
Sodium: 137 mEq/L (ref 137–147)
Sodium: 141 mEq/L (ref 137–147)
Total Bilirubin: 0.5 mg/dL (ref 0.3–1.2)
Total Bilirubin: 0.6 mg/dL (ref 0.3–1.2)
Total Protein: 7.7 g/dL (ref 6.0–8.3)
Total Protein: 7.4 g/dL (ref 6.0–8.3)

## 2014-07-09 LAB — MRSA PCR SCREENING: MRSA BY PCR: POSITIVE — AB

## 2014-07-09 LAB — TROPONIN I
Troponin I: <0.3 ng/mL (ref <0.30)
Troponin I: <0.3 ng/mL (ref <0.30)

## 2014-07-09 LAB — TSH: TSH: 2.12 u[IU]/mL (ref 0.350–4.500)

## 2014-07-09 LAB — HEMOGLOBIN A1C
Hgb A1c MFr Bld: 8.2 % — ABNORMAL HIGH (ref <5.7)
Mean Plasma Glucose: 189 mg/dL — ABNORMAL HIGH (ref <117)

## 2014-07-09 LAB — APTT: aPTT: 23 seconds — ABNORMAL LOW (ref 24–37)

## 2014-07-09 LAB — PROTIME-INR
INR: 1.11 (ref 0.00–1.49)
Prothrombin Time: 14.5 seconds (ref 11.6–15.2)

## 2014-07-09 MED ORDER — PNEUMOCOCCAL VAC POLYVALENT 25 MCG/0.5ML IJ INJ
0.5000 mL | INJECTION | INTRAMUSCULAR | Status: AC
  Administered 2014-07-10: 0.5 mL via INTRAMUSCULAR
  Filled 2014-07-09: qty 0.5

## 2014-07-09 MED ORDER — INSULIN GLARGINE 100 UNIT/ML ~~LOC~~ SOLN
20.0000 [IU] | Freq: Two times a day (BID) | SUBCUTANEOUS | Status: DC
  Administered 2014-07-09 (×2): 20 [IU] via SUBCUTANEOUS
  Filled 2014-07-09 (×3): qty 0.2

## 2014-07-09 MED ORDER — HYDRALAZINE HCL 20 MG/ML IJ SOLN
5.0000 mg | Freq: Once | INTRAMUSCULAR | Status: AC
  Administered 2014-07-09: 5 mg via INTRAVENOUS
  Filled 2014-07-09: qty 1

## 2014-07-09 MED ORDER — LEVOFLOXACIN IN D5W 750 MG/150ML IV SOLN
750.0000 mg | INTRAVENOUS | Status: DC
  Administered 2014-07-09: 750 mg via INTRAVENOUS
  Filled 2014-07-09: qty 150

## 2014-07-09 MED ORDER — POLYETHYLENE GLYCOL 3350 17 G PO PACK
17.0000 g | PACK | Freq: Two times a day (BID) | ORAL | Status: DC
  Administered 2014-07-09 (×2): 17 g via ORAL
  Filled 2014-07-09 (×5): qty 1

## 2014-07-09 MED ORDER — MUPIROCIN 2 % EX OINT
1.0000 "application " | TOPICAL_OINTMENT | Freq: Two times a day (BID) | CUTANEOUS | Status: AC
  Administered 2014-07-09 – 2014-07-13 (×10): 1 via NASAL
  Filled 2014-07-09: qty 22

## 2014-07-09 MED ORDER — HYDRALAZINE HCL 50 MG PO TABS
100.0000 mg | ORAL_TABLET | Freq: Three times a day (TID) | ORAL | Status: DC
  Administered 2014-07-09 – 2014-07-14 (×14): 100 mg via ORAL
  Filled 2014-07-09 (×17): qty 2

## 2014-07-09 MED ORDER — CLONIDINE HCL 0.1 MG/24HR TD PTWK
0.1000 mg | MEDICATED_PATCH | TRANSDERMAL | Status: DC
  Administered 2014-07-09: 0.1 mg via TRANSDERMAL
  Filled 2014-07-09: qty 1

## 2014-07-09 MED ORDER — CHLORHEXIDINE GLUCONATE CLOTH 2 % EX PADS
6.0000 | MEDICATED_PAD | Freq: Every day | CUTANEOUS | Status: AC
  Administered 2014-07-09 – 2014-07-13 (×5): 6 via TOPICAL

## 2014-07-09 NOTE — Evaluation (Signed)
Physical Therapy Evaluation Patient Details Name: Theresa Mclean MRN: [id number] DOB: 1928-11-08 Today's Date: 07/09/2014   History of Present Illness  80 y.o. female with multiple medical problems who presents as a transfer from [facility]. Pt noted to have bradycardia and hyperkalemia. Work up underway.   Clinical Impression  Patient demonstrates deficits in functional mobility as indicated below. Will need continued skilled PT to address deficits and maximize function. Will see as indicated and progress as tolerated.  Patient reports that she has all equipment needed at home and that sone can provide physical assist.  Patient not at her baseline for mobility but states that she is only a "little worse".  Will benefit from HHPT upon acute discharge.    Follow Up Recommendations Home health PT;Supervision/Assistance - 24 hour    Equipment Recommendations  None recommended by PT    Recommendations for Other Services       Precautions / Restrictions Precautions Precautions: Fall Restrictions Weight Bearing Restrictions: No      Mobility  Bed Mobility Overal bed mobility: Needs Assistance Bed Mobility: Rolling;Sidelying to Sit Rolling: Min assist Sidelying to sit: Mod assist       General bed mobility comments: assist to elevate to upright and scoot to EOB  Transfers Overall transfer level: Needs assistance Equipment used: 2 person hand held assist Transfers: Sit to/from [facility] Sit to Stand: Mod assist Stand pivot transfers: Min assist       General transfer comment: increased assist to control descent to chair, VCs to push from bed to elevate to standing  Ambulation/Gait                Stairs            Wheelchair Mobility    Modified Rankin (Stroke Patients Only)       Balance Overall balance assessment: Needs assistance Sitting-balance support: Feet supported Sitting balance-Leahy Scale: Good     Standing  balance support: During functional activity Standing balance-Leahy Scale: Fair Standing balance comment: some assist for stability                             Pertinent Vitals/Pain Pain Assessment: 0-10 Pain Score: 8  Pain Location: deep chest Pain Descriptors / Indicators: Aching Pain Intervention(s): Monitored during session;Limited activity within patient's tolerance;Repositioned    Home Living Family/patient expects to be discharged to:: Private residence Living Arrangements: Children Available Help at Discharge: Family Type of Home: House Home Access: Level entry     Home Layout: One level Home Equipment: Walker - standard;Shower seat      Prior Function Level of Independence: Needs assistance         Comments: Pt son does cooking, cleaning, daughter assists with bathing.  Pt needs min assist with bed mobility at home.  Ambulates short distances with supervision with walker.     Hand Dominance   Dominant Hand: Right    Extremity/Trunk Assessment   Upper Extremity Assessment: Generalized weakness           Lower Extremity Assessment: RLE deficits/detail;LLE deficits/detail RLE Deficits / Details: limited ROM and decreased strength throughout range (+ arthritic changes) LLE Deficits / Details: limited ROM and decreased strength throughout range (+ arthritic changes)  Cervical / Trunk Assessment:  (increased body habitus)  Communication   Communication: No difficulties  Cognition Arousal/Alertness: Awake/alert  General Comments General comments (skin integrity, edema, etc.): Educated regarding positioning and activity for LE edema control as well as upright positioning for intake by mouth (food and drinks).     Exercises        Assessment/Plan    PT Assessment Patient needs continued PT services  PT Diagnosis Difficulty walking;Generalized weakness;Acute pain;Abnormality of gait   PT Problem List  Decreased range of motion;Decreased strength;Decreased activity tolerance;Decreased balance;Decreased mobility;Pain;Obesity  PT Treatment Interventions DME instruction;Gait training;Stair training;Functional mobility training;Therapeutic activities;Therapeutic exercise;Balance training;Patient/family education   PT Goals (Current goals can be found in the Care Plan section) Acute Rehab PT Goals Patient Stated Goal: to return home with son assist PT Goal Formulation: With patient Time For Goal Achievement: 07/23/14 Potential to Achieve Goals: Fair    Frequency Min 3X/week   Barriers to discharge        Co-evaluation               End of Session Equipment Utilized During Treatment: Gait belt Activity Tolerance: Patient tolerated treatment well;Patient limited by fatigue Patient left: in chair;with call bell/phone within reach;with nursing/sitter in room Nurse Communication: Mobility status         Time: 0926-0951 PT Time Calculation (min) (ACUTE ONLY): 25 min   Charges:   PT Evaluation $Initial PT Evaluation Tier I: 1 Procedure PT Treatments $Therapeutic Activity: 8-22 mins $Self Care/Home Management: 8-22   PT G CodesDuncan Dull 07/09/2014, 10:59 AM Alben Deeds, PT DPT  [phone]

## 2014-07-09 NOTE — Progress Notes (Signed)
UR Completed.  Theresa Mclean [phone] 07/09/2014

## 2014-07-09 NOTE — Consult Note (Signed)
CARDIOLOGY CONSULT NOTE   Patient ID: Theresa Mclean MRN: [id number], DOB/AGE: 04/01/1929   Admit date: 07/08/2014 Date of Consult: 07/09/2014  Primary Physician: Glenda Chroman., MD Primary Cardiologist: Dr Bronson Ing  Reason for consult:  Chest pain  Problem List  Past Medical History  Diagnosis Date  . Hypertension   . Nephrolithiasis   . GERD (gastroesophageal reflux disease)   . Arthritis   . Psoriasis   . Morbid obesity   . Mitral regurgitation   . Hyperlipidemia   . Sleep apnea     uses cpap  . CHF (congestive heart failure)     Diastolic. EF 55-65% by cath 10/11/11  . Diabetes mellitus     insulin dependent  . Acute renal insufficiency     09/2011. not on ACEI secondary to this (also has renal artery stenosis)  . Peripheral vascular disease     severe right renal artery stenosis and left SFA stenosis by PV angio 09/2011, treated medically  . Cellulitis   . CAD (coronary artery disease)     NSTEMI 09/2011 felt poor candidate for CABG, instead s/p PTCA/DES to mid RCA 10/17/11  . Chronic respiratory failure   . Pulmonary hypertension   . Obstructive sleep apnea   . CKD (chronic kidney disease) stage 2, GFR 60-89 ml/min 07/11/2013    Past Surgical History  Procedure Laterality Date  . Acromio-clavicular joint repair  2011  . Abdominal hysterectomy    . Eye surgery      cataract  OD     Allergies  No Known Allergies  HPI   80 year old female with h/o HTN, PVD (renal artery stenosis, not on ACEI), left SFA stenosis, hypertension, hyperlipidemia, chronic diastolic CHF, CAD, NSTEMI 09/2011 felt poor candidate for CABG, instead s/p PTCA/DES to mid RCA 10/17/11, chronic respiratory failure, OSA, on home O2 and CPAP at home, followed by [facility] in the clinic, last seen on 05/26/2014. She complained of episodic chest,  imdur was changed to ranolazine for presumed nitrate tolerance development.   She was transferred to [facility] from [facility] for fatigue, found to be in  bradycardia and hyperkalemia. Corrected on admission here. Patient states that she has been having the same chest pain, also mild abdominal pain. She has no diarrhea no vomiting.   She has been feeling very fatigued however. Patient states that she has chronic edema and her left leg has been more swollen than the right side since her knee surgery. Patient had lab work done at [employer] [employer] and her initial potassium was 6.1 with a creatinine of 1.53, today K 4.0, crea 0.99.   Inpatient Medications  . acitretin  10 mg Oral Q supper  . amLODipine  10 mg Oral Daily  . aspirin EC  81 mg Oral Daily  . atorvastatin  20 mg Oral QHS  . calcium-vitamin D  1 tablet Oral Q breakfast  . Chlorhexidine Gluconate Cloth  6 each Topical Q0600  . folic acid  1 mg Oral Daily  . gabapentin  300 mg Oral QID  . heparin  5,000 Units Subcutaneous 3 times per day  . hydrALAZINE  100 mg Oral TID  . insulin aspart  0-15 Units Subcutaneous TID WC  . insulin glargine  20 Units Subcutaneous BID  . levofloxacin (LEVAQUIN) IV  750 mg Intravenous Q48H  . loratadine  10 mg Oral Daily  . methotrexate  7.5 mg Oral Q Wed  . montelukast  10 mg Oral Daily  . multivitamin with minerals  1 tablet Oral Daily  . mupirocin ointment  1 application Nasal BID  . pantoprazole  40 mg Oral Daily  . [START ON 07/10/2014] pneumococcal 23 valent vaccine  0.5 mL Intramuscular Tomorrow-1000  . polyethylene glycol  17 g Oral BID  . ranolazine  500 mg Oral BID  . senna  1 tablet Oral BID  . sodium chloride  3 mL Intravenous Q12H  . sodium chloride  3 mL Intravenous Q12H  . thiamine  100 mg Oral Daily   Family History Family History  Problem Relation Age of Onset  . Psoriasis Brother     Social History History   Social History  . Marital Status: Divorced    Spouse Name: N/A    Number of Children: N/A  . Years of Education: N/A   Occupational History  . Not on file.   Social History Main Topics  . Smoking status: Never Smoker   .  Smokeless tobacco: Never Used  . Alcohol Use: No  . Drug Use: No  . Sexual Activity: No   Other Topics Concern  . Not on file   Social History Narrative    Review of Systems  General:  No chills, fever, night sweats or weight changes.  Cardiovascular:  No chest pain, dyspnea on exertion, edema, orthopnea, palpitations, paroxysmal nocturnal dyspnea. Dermatological: No rash, lesions/masses Respiratory: No cough, dyspnea Urologic: No hematuria, dysuria Abdominal:   No nausea, vomiting, diarrhea, bright red blood per rectum, melena, or hematemesis Neurologic:  No visual changes, wkns, changes in mental status. All other systems reviewed and are otherwise negative except as noted above.  Physical Exam  Blood pressure 153/37, pulse 64, temperature 99.2 F (37.3 C), temperature source Oral, resp. rate 19, height 5\' 4"  (1.626 m), weight 214 lb 4.6 oz (97.2 kg), SpO2 98 %.  General: Pleasant, NAD Psych: Normal affect. Neuro: Alert and oriented X 3. Moves all extremities spontaneously. HEENT: Normal  Neck: Supple without bruits or JVD. Lungs:  Resp regular and unlabored, CTA. Heart: RRR no s3, s4, or murmurs. Abdomen: Soft, non-tender, non-distended, BS + x 4.  Extremities: No clubbing, cyanosis,+1 LE edema, appears chronic. DP/PT/Radials 2+ and equal bilaterally.  Labs  Recent Labs  07/09/14 0026 07/09/14 0415 07/09/14 1048  TROPONINI <0.30 <0.30 <0.30   Lab Results  Component Value Date   WBC 13.0* 07/09/2014   HGB 10.7* 07/09/2014   HCT 34.4* 07/09/2014   MCV 80.2 07/09/2014   PLT 212 07/09/2014    Recent Labs Lab 07/09/14 0415  NA 141  K 4.0  CL 103  CO2 24  BUN 34*  CREATININE 0.99  CALCIUM 10.8*  PROT 7.4  BILITOT 0.6  ALKPHOS 66  ALT 24  AST 15  GLUCOSE 204*   No results found for: CHOL, HDL, LDLCALC, TRIG No results found for: DDIMER Invalid input(s): POCBNP  Radiology/Studies  Dg Chest Port 1 View  07/08/2014   CLINICAL DATA:  Congestive  heart failure.  EXAM: PORTABLE CHEST - 1 VIEW  COMPARISON:  07/08/2014 earlier and 08/12/2013  FINDINGS: Lungs are hypoinflated and demonstrate slight worsening bilateral perihilar opacification/interstitial prominence. No definite effusion. Stable cardiomegaly. Remainder the exam is unchanged.  IMPRESSION: Slight worsening bilateral perihilar opacification/interstitial prominence which may be due to mild interstitial edema versus interstitial pneumonitis.  Stable cardiomegaly.     Echocardiogram 05/2013 Left ventricle: Mild posterior wall and moderate basal septal hypertrophy. The cavity size was normal. Systolic function was normal. The estimated ejection fraction was in the range  of 60% to 65%. Wall motion was normal; there were no regional wall motion abnormalities. Features are consistent with a pseudonormal left ventricular filling pattern, with concomitant abnormal relaxation and increased filling pressure (grade 2 diastolic dysfunction). Doppler parameters are consistent with high ventricular filling pressure. - Aortic valve: A mild gradient is noted across the LVOT. Poorly visualized. Mildly thickened, mildly calcified leaflets. Mean gradient: 20mm Hg (S). Valve area: 1.73cm^2(VTI). Valve area: 1.61cm^2 (Vmax). - Mitral valve: Calcified annulus. Mildly thickened leaflets . Mild regurgitation. - Left atrium: The atrium was mildly dilated. - Tricuspid valve: Mild regurgitation.  ECG: SR, LVH with repolarization abnormalities, new negative T waves in V4 and lead II    ASSESSMENT AND PLAN  80 year old female  1. Chest pain, h/o  PCI to RCA in 2013, with cath demonstrating 2-vessel disease in the RCA and with severe left circumflex disease (serial 90-95% mid LCx lesions by cath on 10-11-2011). Nuclear myocardial perfusion study stress was normal in November of 2014. ECG shows new negative T waves in V4 and lead II, I would consider a Lexiscan nuclear stress  test. Continue ASA, atorvastatin and ranolazine. Atenolol was held secondary to bradycardia.   2. Acute on chronic diastolic heart failure: baseline weight 219, today 214, she states that her legs are swollen, but according to her son this is better than baseline. CXR shows mild interstitial edema versus interstitial pneumonitis, she feels warm and has leukocytosis, for now I would continue home dose of lasix 40 mg PO daily and spironolactone 25 mg po daily.  3. Essential hypertension: Uncontrolled after stopping atenolol, we will restart clonidine. No ACEI (renal artery senosis).  4. OSA: She has been compliant with CPAP.   5. PVD: Severe right renal artery stenosis and left SFA stenosis by PV angio 09/2011.   6. Hyperlipidemia: On Lipitor 20 mg daily.    Signed, Dorothy Spark, MD, [facility] 07/09/2014, 12:34 PM

## 2014-07-09 NOTE — Progress Notes (Signed)
Patient Demographics  Theresa Mclean, is a 80 y.o. female, DOB - 05/31/1929, GLO:[id number]  Admit date - 07/08/2014   Admitting Physician Ripudeep Krystal Eaton, MD  Outpatient Primary MD for the patient is [name],[name], MD  LOS - 1   No chief complaint on file.       Subjective:   Theresa Zillmer today has, No headache, No chest pain, No abdominal pain - No Nausea, No new weakness tingling or numbness, No Cough - SOB.    Assessment & Plan    1. Abdominal pain nausea vomiting. Likely gastroenteritis and currently symptom-free, will obtain abdominal x-ray and monitor. Supportive care for now.   2. Bradycardia and hyperkalemia upon arrival to [facility] ER. Currently in sinus rhythm, beta blocker discontinued, hyperkalemia corrected, monitor on telemetry, obtain echogram, TSH is stable, cardiology called.   3. CAD - T-wave inversion in lateral leads on EKG. Chest pain-free. Troponin negative 2, beta blocker held due to bradycardia, echo ordered and cardiology consulted. Continue aspirin and statin. She is also on Ranexa which will be continued. As needed sublingual nitroglycerin.    4.DM type II. Continue Lantus and sliding scale check A1c, monitor CBGs.  CBG (last 3)   Recent Labs  07/09/14 0803  GLUCAP 231*     Lab Results  Component Value Date   HGBA1C 7.4* 07/10/2013     5. Essential hypertension - Continue Norvasc, increase hydralazine for better control. Beta blocker held due to bradycardia.     6. Possible CAP . Afebrile, mild leukocytosis, place on Levaquin, speech eval and monitor.    7. 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     8.GERD - PPI    9.Diabetic neuropathy. Continue Neurontin.    10. Indwelling  Foley catheter. Placed recently at [facility]. Continue for now.      Code Status: Full  Family Communication: none present  Disposition Plan:  TBD   Procedures  Echo   Consults      Medications  Scheduled Meds: . acitretin  10 mg Oral Q supper  . amLODipine  10 mg Oral Daily  . aspirin EC  81 mg Oral Daily  . atorvastatin  20 mg Oral QHS  . calcium-vitamin D  1 tablet Oral Q breakfast  . Chlorhexidine Gluconate Cloth  6 each Topical Q0600  . folic acid  1 mg Oral Daily  . gabapentin  300 mg Oral QID  . heparin  5,000 Units Subcutaneous 3 times per day  . hydrALAZINE  50 mg Oral TID  . insulin aspart  0-15 Units Subcutaneous TID WC  . insulin glargine  50 Units Subcutaneous Daily  . levofloxacin (LEVAQUIN) IV  750 mg Intravenous Q48H  . loratadine  10 mg Oral Daily  . methotrexate  7.5 mg Oral Q Wed  . montelukast  10 mg Oral Daily  . multivitamin with minerals  1 tablet Oral Daily  . mupirocin ointment  1 application Nasal BID  . pantoprazole  40 mg Oral Daily  . [START ON 07/10/2014] pneumococcal 23 valent vaccine  0.5 mL Intramuscular Tomorrow-1000  . polyethylene glycol  17 g Oral BID  . ranolazine  500 mg Oral BID  . senna  1 tablet Oral BID  . sodium chloride  3 mL Intravenous Q12H  . sodium chloride  3 mL Intravenous Q12H  . thiamine  100 mg Oral Daily   Continuous Infusions:  PRN Meds:.sodium chloride, acetaminophen **OR** [DISCONTINUED] acetaminophen, bisacodyl, HYDROcodone-acetaminophen, methocarbamol, morphine injection, nitroGLYCERIN, [DISCONTINUED] ondansetron **OR** ondansetron (ZOFRAN) IV, polyethylene glycol, sodium chloride  DVT Prophylaxis    Heparin    Lab Results  Component Value Date   PLT 212 07/09/2014    Antibiotics     Anti-infectives    Start     Dose/Rate Route Frequency Ordered Stop   07/09/14 0900  levofloxacin (LEVAQUIN) IVPB 750 mg     750 mg100 mL/hr over 90 Minutes Intravenous Every 48 hours 07/09/14 0814             Objective:   Filed Vitals:   07/09/14 0000 07/09/14 0432 07/09/14 0534 07/09/14 0804  BP: 184/50 183/44 180/46 152/65  Pulse: 51 63  68  Temp: 98 F (36.7 C) 99 F (37.2 C)  99.6 F (37.6 C)  TempSrc: Oral Oral  Oral  Resp: 26 21    Height:  5\' 4"  (1.626 m)    Weight:  97.2 kg (214 lb 4.6 oz)    SpO2: 96% 97%  96%    Wt Readings from Last 3 Encounters:  07/09/14 97.2 kg (214 lb 4.6 oz)  05/26/14 99.338 kg (219 lb)  02/07/14 96.616 kg (213 lb)     Intake/Output Summary (Last 24 hours) at 07/09/14 0947 Last data filed at 07/09/14 0939  Gross per 24 hour  Intake    123 ml  Output   1000 ml  Net   -877 ml     Physical Exam  Awake Alert, Oriented X 2, No new F.N deficits, Normal affect [place].AT,PERRAL Supple Neck,No JVD, No cervical lymphadenopathy appriciated.  Symmetrical Chest wall movement, Good air movement bilaterally, bibasilar rales L>R RRR,No Gallops,Rubs or new Murmurs, No Parasternal Heave +ve B.Sounds, Abd Soft, No tenderness, No organomegaly appriciated, No rebound - guarding or rigidity. No Cyanosis, Clubbing or edema, No new Rash or bruise      Data Review   Micro Results Recent Results (from the past 240 hour(s))  MRSA PCR Screening     Status: Abnormal   Collection Time: 07/08/14  9:55 PM  Result Value Ref Range Status   MRSA by PCR POSITIVE (A) NEGATIVE Final    Comment:        The GeneXpert MRSA Assay (FDA approved for NASAL specimens only), is one component of a comprehensive MRSA colonization surveillance program. It is not intended to diagnose MRSA infection nor to guide or monitor treatment for MRSA infections. RESULT CALLED TO, READ BACK BY AND VERIFIED WITH: M.EVANGALISTA,RN AT 0119 AT 07/09/18 BY L.PITT     Radiology Reports Dg Chest Port 1 View  07/08/2014   CLINICAL DATA:  Congestive heart failure.  EXAM: PORTABLE CHEST - 1 VIEW  COMPARISON:  07/08/2014 earlier and 08/12/2013  FINDINGS: Lungs are hypoinflated and  demonstrate slight worsening bilateral perihilar opacification/interstitial prominence. No definite effusion. Stable cardiomegaly. Remainder the exam is unchanged.  IMPRESSION: Slight worsening bilateral perihilar opacification/interstitial prominence which may be due to mild interstitial edema versus interstitial pneumonitis.  Stable cardiomegaly.   Electronically Signed   By: Marin Olp M.D.   On: 07/08/2014 23:42     CBC  Recent Labs Lab 07/09/14 0026 07/09/14 0415  WBC 12.4* 13.0*  HGB 10.8* 10.7*  HCT 34.5* 34.4*  PLT  PLATELET CLUMPS NOTED ON SMEAR, UNABLE TO ESTIMATE 212  MCV 81.0 80.2  MCH 25.4* 24.9*  MCHC 31.3 31.1  RDW 19.5* 19.7*    Chemistries   Recent Labs Lab 07/09/14 0026 07/09/14 0415  NA 137 141  K 4.3 4.0  CL 101 103  CO2 20 24  GLUCOSE 189* 204*  BUN 36* 34*  CREATININE 1.09 0.99  CALCIUM 10.6* 10.8*  AST 15 15  ALT 24 24  ALKPHOS 69 66  BILITOT 0.5 0.6   ------------------------------------------------------------------------------------------------------------------ estimated creatinine clearance is 48.7 mL/min (by C-G formula based on Cr of 0.99). ------------------------------------------------------------------------------------------------------------------ No results for input(s): HGBA1C in the last 72 hours. ------------------------------------------------------------------------------------------------------------------ No results for input(s): CHOL, HDL, LDLCALC, TRIG, CHOLHDL, LDLDIRECT in the last 72 hours. ------------------------------------------------------------------------------------------------------------------  Recent Labs  07/09/14 0026  TSH 2.120   ------------------------------------------------------------------------------------------------------------------ No results for input(s): VITAMINB12, FOLATE, FERRITIN, TIBC, IRON, RETICCTPCT in the last 72 hours.  Coagulation profile  Recent Labs Lab 07/09/14 0415   INR 1.11    No results for input(s): DDIMER in the last 72 hours.  Cardiac Enzymes  Recent Labs Lab 07/09/14 0026 07/09/14 0415  TROPONINI <0.30 <0.30   ------------------------------------------------------------------------------------------------------------------ Invalid input(s): POCBNP     Time Spent in minutes   35   Horace Lukas K M.D on 07/09/2014 at 9:47 AM  Between 7am to 7pm - Pager - [phone]  After 7pm go to [email or web address] - password [email or web address]  And look for the night coverage person covering for me after hours  [facility] Office  [phone]

## 2014-07-09 NOTE — Plan of Care (Signed)
Problem: Phase I Progression Outcomes Goal: Pain controlled with appropriate interventions Outcome: Completed/Met Date Met:  07/09/14 Goal: Initial discharge plan identified Outcome: Completed/Met Date Met:  07/09/14

## 2014-07-09 NOTE — Progress Notes (Signed)
ANTIBIOTIC CONSULT NOTE - INITIAL  Pharmacy Consult for Levaquin Indication: pneumonia  No Known Allergies  Patient Measurements: Height: 5\' 4"  (672.0 cm) Weight: 214 lb 4.6 oz (97.2 kg) IBW/kg (Calculated) : 54.7  Vital Signs: Temp: 99.6 F (37.6 C) (11/11 0804) Temp Source: Oral (11/11 0804) BP: 152/65 mmHg (11/11 0804) Pulse Rate: 68 (11/11 0804) Intake/Output from previous day: 11/10 0701 - 11/11 0700 In: 3 [I.V.:3] Out: -  Intake/Output from this shift:    Labs:  Recent Labs  07/09/14 0026 07/09/14 0415  WBC 12.4* 13.0*  HGB 10.8* 10.7*  PLT PLATELET CLUMPS NOTED ON SMEAR, UNABLE TO ESTIMATE 212  CREATININE 1.09 0.99   Estimated Creatinine Clearance: 48.7 mL/min (by C-G formula based on Cr of 0.99). No results for input(s): VANCOTROUGH, VANCOPEAK, VANCORANDOM, GENTTROUGH, GENTPEAK, GENTRANDOM, TOBRATROUGH, TOBRAPEAK, TOBRARND, AMIKACINPEAK, AMIKACINTROU, AMIKACIN in the last 72 hours.   Microbiology: Recent Results (from the past 720 hour(s))  MRSA PCR Screening     Status: Abnormal   Collection Time: 07/08/14  9:55 PM  Result Value Ref Range Status   MRSA by PCR POSITIVE (A) NEGATIVE Final    Comment:        The GeneXpert MRSA Assay (FDA approved for NASAL specimens only), is one component of a comprehensive MRSA colonization surveillance program. It is not intended to diagnose MRSA infection nor to guide or monitor treatment for MRSA infections. RESULT CALLED TO, READ BACK BY AND VERIFIED WITH: M.EVANGALISTA,RN AT 0119 AT 07/09/18 BY L.PITT     Medical History: Past Medical History  Diagnosis Date  . Hypertension   . Nephrolithiasis   . GERD (gastroesophageal reflux disease)   . Arthritis   . Psoriasis   . Morbid obesity   . Mitral regurgitation   . Hyperlipidemia   . Sleep apnea     uses cpap  . CHF (congestive heart failure)     Diastolic. EF 55-65% by cath 10/11/11  . Diabetes mellitus     insulin dependent  . Acute renal  insufficiency     09/2011. not on ACEI secondary to this (also has renal artery stenosis)  . Peripheral vascular disease     severe right renal artery stenosis and left SFA stenosis by PV angio 09/2011, treated medically  . Cellulitis   . CAD (coronary artery disease)     NSTEMI 09/2011 felt poor candidate for CABG, instead s/p PTCA/DES to mid RCA 10/17/11  . Chronic respiratory failure   . Pulmonary hypertension   . Obstructive sleep apnea   . CKD (chronic kidney disease) stage 2, GFR 60-89 ml/min 07/11/2013    Medications: see electronic med rec  Assessment: 80 yo female [facility].  Bradycardic and hyperkalemic which has resolved today K=4.0 11/10 CXray Slight worsening bilateral perihilar opacification/interstitial prominence which may be due to mild interstitial edema versus interstitial pneumonitis.   Plan:  Follow up culture results  Levaquin 750mg  iv q48h for crcl 56ml/min F/u renal function  Trenton Gammon, Maalik Pinn L 07/09/2014,8:15 AM

## 2014-07-09 NOTE — Progress Notes (Signed)
Inpatient Diabetes Program Recommendations  AACE/ADA: New Consensus Statement on Inpatient Glycemic Control (2013)  Target Ranges:  Prepandial:   less than 140 mg/dL      Peak postprandial:   less than 180 mg/dL (1-2 hours)      Critically ill patients:  140 - 180 mg/dL  Results for Saner, [place] H (MRN [id number]) as of 07/09/2014 09:03  Ref. Range 07/09/2014 00:26 07/09/2014 04:15  Glucose Latest Range: 70-99 mg/dL 189 (H) 204 (H)   Results for Joswick, [place] H (MRN [id number]) as of 07/09/2014 09:03  Ref. Range 07/09/2014 08:03  Glucose-Capillary Latest Range: 70-99 mg/dL 231 (H)   Diabetes history: DM2 Outpatient Diabetes medications: Lantus 15 units BID, Novolog 0-9 units ("sometimes, maybe once a day but sometimes not at all") Current orders for Inpatient glycemic control: Lantus 50 units daily, Novolog 0-15 units [facility]  Inpatient Diabetes Program Recommendations Insulin - Basal: Talked with patient and she states that she takes Lantus 15 units BID and her glucose stays in the 100's mg/dl for the most part and she rarely ever has hypoglycemia. Please consider decreasing Lantus.  Thanks, Barnie Alderman, RN, MSN, CCRN, CDE Diabetes Coordinator Inpatient Diabetes Program [phone] (Team Pager) [phone] ([facility] office) [phone] [facility] office)

## 2014-07-09 NOTE — Evaluation (Signed)
Clinical/Bedside Swallow Evaluation Patient Details  Name: Theresa Mclean MRN: [id number] Date of Birth: 04/24/1929  Today's Date: 07/09/2014 Time: 0901-0924 SLP Time Calculation (min) (ACUTE ONLY): 23 min  Past Medical History:  Past Medical History  Diagnosis Date  . Hypertension   . Nephrolithiasis   . GERD (gastroesophageal reflux disease)   . Arthritis   . Psoriasis   . Morbid obesity   . Mitral regurgitation   . Hyperlipidemia   . Sleep apnea     uses cpap  . CHF (congestive heart failure)     Diastolic. EF 55-65% by cath 10/11/11  . Diabetes mellitus     insulin dependent  . Acute renal insufficiency     09/2011. not on ACEI secondary to this (also has renal artery stenosis)  . Peripheral vascular disease     severe right renal artery stenosis and left SFA stenosis by PV angio 09/2011, treated medically  . Cellulitis   . CAD (coronary artery disease)     NSTEMI 09/2011 felt poor candidate for CABG, instead s/p PTCA/DES to mid RCA 10/17/11  . Chronic respiratory failure   . Pulmonary hypertension   . Obstructive sleep apnea   . CKD (chronic kidney disease) stage 2, GFR 60-89 ml/min 07/11/2013   Past Surgical History:  Past Surgical History  Procedure Laterality Date  . Acromio-clavicular joint repair  2011  . Abdominal hysterectomy    . Eye surgery      cataract  OD   HPI:  Theresa Mclean is an 80 y.o. female with multiple medical problems who presents as a transfer from [facility]. Patient presented to [facility] not feeling well. She was noted to have bradycardia and also was noted to have hyperkalemia. Patient states that she has been having some vague abdominal pain. Pt reports needing her esophagus stretched a few years ago and has recently been having the sensation of foods sticking in her distal esophagus with associated pain. CXR with possible mild interstitial edema versus interstitial pneumonitis.   Assessment / Plan / Recommendation Clinical  Impression  Pt's oropharyngeal swallow appears timely and with adequate strength, although she does have SOB throughout self-feeding. SLP provided Min cues for environmental adjustements and compensatory strategies to conserve energy and facilitate breathing in order to reduce aspiration risk. Pt does describe the sensation of food sticking in her distal esophagus with associated pain, which MD may wish to assess further. Given the above, will f/u briefly for tolerance and reinforcement of education.    Aspiration Risk  Mild    Diet Recommendation Regular;Thin liquid   Liquid Administration via: Cup;Straw Medication Administration: Whole meds with liquid Supervision: Patient able to self feed;Intermittent supervision to cue for compensatory strategies Compensations: Slow rate;Small sips/bites;Follow solids with liquid Postural Changes and/or Swallow Maneuvers: Seated upright 90 degrees;Upright 30-60 min after meal    Other  Recommendations Oral Care Recommendations: Oral care BID   Follow Up Recommendations  None    Frequency and Duration min 1 x/week  1 week   Pertinent Vitals/Pain n/a    SLP Swallow Goals     Swallow Study Prior Functional Status       General HPI: Theresa Mclean is an 81 y.o. female with multiple medical problems who presents as a transfer from [facility]. Patient presented to [facility] not feeling well. She was noted to have bradycardia and also was noted to have hyperkalemia. Patient states that she has been having some vague abdominal pain. Pt reports needing her  esophagus stretched a few years ago and has recently been having the sensation of foods sticking in her distal esophagus with associated pain. CXR with possible mild interstitial edema versus interstitial pneumonitis. Type of Study: Bedside swallow evaluation Previous Swallow Assessment: none in chart Diet Prior to this Study: Regular;Thin liquids Temperature Spikes Noted: No Respiratory  Status: Nasal cannula History of Recent Intubation: No Behavior/Cognition: Alert;Cooperative;Pleasant mood;Requires cueing Self-Feeding Abilities: Able to feed self Patient Positioning: Upright in bed Baseline Vocal Quality: Clear    Oral/Motor/Sensory Function Overall Oral Motor/Sensory Function: Appears within functional limits for tasks assessed   Ice Chips Ice chips: Not tested   Thin Liquid Thin Liquid: Within functional limits Presentation: Self Fed;Straw    Nectar Thick Nectar Thick Liquid: Not tested   Honey Thick Honey Thick Liquid: Not tested   Puree Puree: Within functional limits Presentation: Self Fed;Spoon   Solid   GO    Solid: Within functional limits Presentation: Self Fed        Germain Osgood, M.A. CCC-SLP [phone]  Germain Osgood 07/09/2014,9:47 AM

## 2014-07-10 ENCOUNTER — Inpatient Hospital Stay (HOSPITAL_COMMUNITY): Payer: Medicare Other

## 2014-07-10 DIAGNOSIS — I2510 Atherosclerotic heart disease of native coronary artery without angina pectoris: Secondary | ICD-10-CM

## 2014-07-10 DIAGNOSIS — K529 Noninfective gastroenteritis and colitis, unspecified: Secondary | ICD-10-CM | POA: Diagnosis not present

## 2014-07-10 DIAGNOSIS — R079 Chest pain, unspecified: Secondary | ICD-10-CM

## 2014-07-10 LAB — CBC
HCT: 33.4 % — ABNORMAL LOW (ref 36.0–46.0)
Hemoglobin: 10.3 g/dL — ABNORMAL LOW (ref 12.0–15.0)
MCH: 24.9 pg — AB (ref 26.0–34.0)
MCHC: 30.8 g/dL (ref 30.0–36.0)
MCV: 80.7 fL (ref 78.0–100.0)
PLATELETS: 166 10*3/uL (ref 150–400)
RBC: 4.14 MIL/uL (ref 3.87–5.11)
RDW: 19.9 % — AB (ref 11.5–15.5)
WBC: 13.2 10*3/uL — ABNORMAL HIGH (ref 4.0–10.5)

## 2014-07-10 LAB — BASIC METABOLIC PANEL
ANION GAP: 14 (ref 5–15)
BUN: 22 mg/dL (ref 6–23)
CALCIUM: 10.8 mg/dL — AB (ref 8.4–10.5)
CO2: 24 mEq/L (ref 19–32)
CREATININE: 0.87 mg/dL (ref 0.50–1.10)
Chloride: 101 mEq/L (ref 96–112)
GFR calc non Af Amer: 60 mL/min — ABNORMAL LOW (ref >90)
GFR, EST AFRICAN AMERICAN: 69 mL/min — AB (ref >90)
Glucose, Bld: 176 mg/dL — ABNORMAL HIGH (ref 70–99)
Potassium: 4.2 mEq/L (ref 3.7–5.3)
Sodium: 139 mEq/L (ref 137–147)

## 2014-07-10 LAB — GLUCOSE, CAPILLARY
GLUCOSE-CAPILLARY: 191 mg/dL — AB (ref 70–99)
Glucose-Capillary: 187 mg/dL — ABNORMAL HIGH (ref 70–99)
Glucose-Capillary: 195 mg/dL — ABNORMAL HIGH (ref 70–99)
Glucose-Capillary: 170 mg/dL — ABNORMAL HIGH (ref 70–99)

## 2014-07-10 MED ORDER — REGADENOSON 0.4 MG/5ML IV SOLN
INTRAVENOUS | Status: AC
  Administered 2014-07-10: 0.4 mg via INTRAVENOUS
  Filled 2014-07-10: qty 5

## 2014-07-10 MED ORDER — POLYETHYLENE GLYCOL 3350 17 G PO PACK
17.0000 g | PACK | Freq: Two times a day (BID) | ORAL | Status: DC
  Administered 2014-07-10 – 2014-07-14 (×8): 17 g via ORAL
  Filled 2014-07-10 (×10): qty 1

## 2014-07-10 MED ORDER — DOCUSATE SODIUM 100 MG PO CAPS
200.0000 mg | ORAL_CAPSULE | Freq: Two times a day (BID) | ORAL | Status: DC
  Administered 2014-07-10 – 2014-07-14 (×8): 200 mg via ORAL
  Filled 2014-07-10 (×11): qty 2

## 2014-07-10 MED ORDER — LEVOFLOXACIN IN D5W 750 MG/150ML IV SOLN
750.0000 mg | INTRAVENOUS | Status: DC
  Administered 2014-07-10 – 2014-07-11 (×2): 750 mg via INTRAVENOUS
  Filled 2014-07-10 (×2): qty 150

## 2014-07-10 MED ORDER — INSULIN GLARGINE 100 UNIT/ML ~~LOC~~ SOLN
22.0000 [IU] | Freq: Two times a day (BID) | SUBCUTANEOUS | Status: DC
  Administered 2014-07-10 – 2014-07-14 (×7): 22 [IU] via SUBCUTANEOUS
  Filled 2014-07-10 (×11): qty 0.22

## 2014-07-10 MED ORDER — REGADENOSON 0.4 MG/5ML IV SOLN
0.4000 mg | Freq: Once | INTRAVENOUS | Status: AC
  Administered 2014-07-10: 0.4 mg via INTRAVENOUS

## 2014-07-10 MED ORDER — BISACODYL 10 MG RE SUPP
10.0000 mg | Freq: Two times a day (BID) | RECTAL | Status: DC
  Administered 2014-07-10 – 2014-07-13 (×7): 10 mg via RECTAL
  Filled 2014-07-10 (×8): qty 1

## 2014-07-10 NOTE — Progress Notes (Signed)
Patient returned from nuclear med MD states test was cancelled because patient was confused. Patient returned to unit alert and able to verbalize name, DOB, place. Patient states that she did not know all the answers to questions down stairs. Melina Copa PA  With cardiology and Dr. Candiss Norse made aware. Patient shows no signs of distress.

## 2014-07-10 NOTE — Progress Notes (Signed)
Patient Demographics  Theresa Mclean, is a 80 y.o. female, DOB - March 25, 1929, VOJ:[id number]  Admit date - 07/08/2014   Admitting Physician Ripudeep Krystal Eaton, MD  Outpatient Primary MD for the patient is [name],[name], MD  LOS - 2   No chief complaint on file.       Subjective:   Theresa Mclean today has, No headache, No chest pain, No abdominal pain - No Nausea, No new weakness tingling or numbness, No Cough - SOB.    Assessment & Plan    1. Abdominal pain nausea vomiting. Likely gastroenteritis and currently symptom-free - x-ray shows evidence of stool burden, now symptom-free, bowel regimen initiated.   2. Bradycardia and hyperkalemia upon arrival to [facility] ER. Currently in sinus rhythm, beta blocker discontinued, hyperkalemia corrected, monitor on telemetry, obtain echogram, TSH is stable, cardiology following, rate stable.   3. CAD - T-wave inversion in lateral leads on EKG. Chest pain-free. Troponin negative , beta blocker held due to bradycardia, echo without any wall motion abnormality and a preserved EF of 60%, cardiology consulted. Continue aspirin and statin. She is also on Ranexa which will be continued. As needed sublingual nitroglycerin. Cardiology has ordered a chemical stress test on 07/10/2014.    4.DM type II. Continue Lantus and sliding scale check A1c, monitor CBGs.  CBG (last 3)   Recent Labs  07/09/14 1730 07/09/14 2038 07/10/14 0753  GLUCAP 163* 174* 187*     Lab Results  Component Value Date   HGBA1C 8.2* 07/09/2014     5. Essential hypertension - Continue Norvasc, increased hydralazine and Catapres added by cardiology for better control. Beta blocker held due to bradycardia.      6. Possible CAP . Afebrile, mild leukocytosis, place on Levaquin, speech  eval and monitor.    7. Chr diastolic congestive heart failure Ef 60% - compensated this admission.     8.GERD - PPI    9.Diabetic neuropathy. Continue Neurontin.    10. Indwelling Foley catheter. Placed recently at [facility]. Will give a trial of removal and monitor.    11. OSA. Daily at bedtime CPAP.      Code Status: Full  Family Communication: none present  Disposition Plan:  TBD   Procedures  Echo, [place]   Consults   cardiology   Medications  Scheduled Meds: . acitretin  10 mg Oral Q supper  . amLODipine  10 mg Oral Daily  . aspirin EC  81 mg Oral Daily  . atorvastatin  20 mg Oral QHS  . bisacodyl  10 mg Rectal BID  . calcium-vitamin D  1 tablet Oral Q breakfast  . Chlorhexidine Gluconate Cloth  6 each Topical Q0600  . cloNIDine  0.1 mg Transdermal Weekly  . docusate sodium  200 mg Oral BID  . folic acid  1 mg Oral Daily  . gabapentin  300 mg Oral QID  . heparin  5,000 Units Subcutaneous 3 times per day  . hydrALAZINE  100 mg Oral TID  . insulin aspart  0-15 Units Subcutaneous TID WC  . insulin glargine  22 Units Subcutaneous BID  . levofloxacin (LEVAQUIN) IV  750 mg Intravenous Q48H  . loratadine  10 mg Oral Daily  . methotrexate  7.5 mg Oral  Q Wed  . montelukast  10 mg Oral Daily  . multivitamin with minerals  1 tablet Oral Daily  . mupirocin ointment  1 application Nasal BID  . pantoprazole  40 mg Oral Daily  . pneumococcal 23 valent vaccine  0.5 mL Intramuscular Tomorrow-1000  . polyethylene glycol  17 g Oral BID  . ranolazine  500 mg Oral BID  . senna  1 tablet Oral BID  . sodium chloride  3 mL Intravenous Q12H  . sodium chloride  3 mL Intravenous Q12H  . thiamine  100 mg Oral Daily   Continuous Infusions:  PRN Meds:.sodium chloride, acetaminophen **OR** [DISCONTINUED] acetaminophen, HYDROcodone-acetaminophen, methocarbamol, morphine injection, nitroGLYCERIN, [DISCONTINUED] ondansetron **OR** ondansetron (ZOFRAN) IV, sodium  chloride  DVT Prophylaxis    Heparin    Lab Results  Component Value Date   PLT 166 07/10/2014    Antibiotics     Anti-infectives    Start     Dose/Rate Route Frequency Ordered Stop   07/09/14 0900  levofloxacin (LEVAQUIN) IVPB 750 mg     750 mg100 mL/hr over 90 Minutes Intravenous Every 48 hours 07/09/14 0814            Objective:   Filed Vitals:   07/10/14 0058 07/10/14 0400 07/10/14 0600 07/10/14 0751  BP:  164/53 162/39 157/59  Pulse:  81 77 84  Temp: 99.8 F (37.7 C) 100 F (37.8 C) 98.9 F (37.2 C) 99 F (37.2 C)  TempSrc: Oral Axillary Oral Oral  Resp:  33 29 28  Height:      Weight:      SpO2:  95% 83% 96%    Wt Readings from Last 3 Encounters:  07/09/14 97.2 kg (214 lb 4.6 oz)  05/26/14 99.338 kg (219 lb)  02/07/14 96.616 kg (213 lb)     Intake/Output Summary (Last 24 hours) at 07/10/14 0921 Last data filed at 07/10/14 0600  Gross per 24 hour  Intake    120 ml  Output   1225 ml  Net  -1105 ml     Physical Exam  Awake Alert, Oriented X 2, No new F.N deficits, Normal affect [place].AT,PERRAL Supple Neck,No JVD, No cervical lymphadenopathy appriciated.  Symmetrical Chest wall movement, Good air movement bilaterally, bibasilar rales L>R RRR,No Gallops,Rubs or new Murmurs, No Parasternal Heave +ve B.Sounds, Abd Soft, No tenderness, No organomegaly appriciated, No rebound - guarding or rigidity. No Cyanosis, Clubbing or edema, No new Rash or bruise      Data Review   Micro Results Recent Results (from the past 240 hour(s))  MRSA PCR Screening     Status: Abnormal   Collection Time: 07/08/14  9:55 PM  Result Value Ref Range Status   MRSA by PCR POSITIVE (A) NEGATIVE Final    Comment:        The GeneXpert MRSA Assay (FDA approved for NASAL specimens only), is one component of a comprehensive MRSA colonization surveillance program. It is not intended to diagnose MRSA infection nor to guide or monitor treatment for MRSA infections. RESULT  CALLED TO, READ BACK BY AND VERIFIED WITH: M.EVANGALISTA,RN AT 0119 AT 07/09/18 BY L.PITT     Radiology Reports Dg Chest Port 1 View  07/08/2014   CLINICAL DATA:  Congestive heart failure.  EXAM: PORTABLE CHEST - 1 VIEW  COMPARISON:  07/08/2014 earlier and 08/12/2013  FINDINGS: Lungs are hypoinflated and demonstrate slight worsening bilateral perihilar opacification/interstitial prominence. No definite effusion. Stable cardiomegaly. Remainder the exam is unchanged.  IMPRESSION: Slight worsening bilateral  perihilar opacification/interstitial prominence which may be due to mild interstitial edema versus interstitial pneumonitis.  Stable cardiomegaly.   Electronically Signed   By: Marin Olp M.D.   On: 07/08/2014 23:42   Dg Abd Acute W/chest  07/09/2014   CLINICAL DATA:  Difficulty breathing ; abdominal pain  EXAM: ACUTE ABDOMEN SERIES (ABDOMEN 2 VIEW & CHEST 1 VIEW)  COMPARISON:  Chest radiograph July 08, 2014 and July 09, 2013 ; CT abdomen and pelvis July 08, 2014  FINDINGS: PA chest: There is scarring in the left base. There is stable generalized interstitial prominence, probably reflecting chronic interstitial fibrosis. No new opacity seen. Heart is mildly enlarged with pulmonary vascularity within normal limits. No adenopathy.  Supine and left lateral decubitus abdomen: There is diffuse stool throughout the colon. There is no bowel dilatation or air-fluid level to suggest obstruction. No free air. There are multiple phleboliths in the pelvis. Calcifications are noted to the right of L4 and L5. Suspect small calcified mesenteric lymph nodes are possible phleboliths.  IMPRESSION: Diffuse stool in colon. Bowel gas pattern unremarkable. Probable chronic interstitial fibrotic type change. No frank edema or consolidation. Stable mild cardiac enlargement.   Electronically Signed   By: Lowella Grip M.D.   On: 07/09/2014 13:21     CBC  Recent Labs Lab 07/09/14 0026 07/09/14 0415  07/10/14 0316  WBC 12.4* 13.0* 13.2*  HGB 10.8* 10.7* 10.3*  HCT 34.5* 34.4* 33.4*  PLT PLATELET CLUMPS NOTED ON SMEAR, UNABLE TO ESTIMATE 212 166  MCV 81.0 80.2 80.7  MCH 25.4* 24.9* 24.9*  MCHC 31.3 31.1 30.8  RDW 19.5* 19.7* 19.9*    Chemistries   Recent Labs Lab 07/09/14 0026 07/09/14 0415 07/10/14 0316  NA 137 141 139  K 4.3 4.0 4.2  CL 101 103 101  CO2 20 24 24   GLUCOSE 189* 204* 176*  BUN 36* 34* 22  CREATININE 1.09 0.99 0.87  CALCIUM 10.6* 10.8* 10.8*  AST 15 15  --   ALT 24 24  --   ALKPHOS 69 66  --   BILITOT 0.5 0.6  --    ------------------------------------------------------------------------------------------------------------------ estimated creatinine clearance is 55.5 mL/min (by C-G formula based on Cr of 0.87). ------------------------------------------------------------------------------------------------------------------  Recent Labs  07/09/14 1048  HGBA1C 8.2*   ------------------------------------------------------------------------------------------------------------------ No results for input(s): CHOL, HDL, LDLCALC, TRIG, CHOLHDL, LDLDIRECT in the last 72 hours. ------------------------------------------------------------------------------------------------------------------  Recent Labs  07/09/14 0026  TSH 2.120   ------------------------------------------------------------------------------------------------------------------ No results for input(s): VITAMINB12, FOLATE, FERRITIN, TIBC, IRON, RETICCTPCT in the last 72 hours.  Coagulation profile  Recent Labs Lab 07/09/14 0415  INR 1.11    No results for input(s): DDIMER in the last 72 hours.  Cardiac Enzymes  Recent Labs Lab 07/09/14 0026 07/09/14 0415 07/09/14 1048  TROPONINI <0.30 <0.30 <0.30   ------------------------------------------------------------------------------------------------------------------ Invalid input(s): POCBNP     Time Spent in minutes    35   Espen Bethel K M.D on 07/10/2014 at 9:21 AM  Between 7am to 7pm - Pager - [phone]  After 7pm go to [email or web address] - password [email or web address]  And look for the night coverage person covering for me after hours  [facility] Office  [phone]

## 2014-07-10 NOTE — Progress Notes (Signed)
SUBJECTIVE: One episode of chest pain last night. Breathing is at baseline.   BP 162/39 mmHg  Pulse 77  Temp(Src) 98.9 F (37.2 C) (Oral)  Resp 29  Ht 5\' 4"  (1.626 m)  Wt 214 lb 4.6 oz (97.2 kg)  BMI 36.76 kg/m2  SpO2 83%  LMP 08/29/1990  Intake/Output Summary (Last 24 hours) at 07/10/14 0758 Last data filed at 07/10/14 0600  Gross per 24 hour  Intake    120 ml  Output   2225 ml  Net  -2105 ml    PHYSICAL EXAM General: Obese elderly female. No acute distress. Alert and oriented x 3.  Psych:  Good affect, responds appropriately Neck: No JVD. No masses noted.  Lungs: Clear bilaterally with no wheezes or rhonci noted.  Heart: RRR with no murmurs noted. Abdomen: Bowel sounds are present. Soft, non-tender.  Extremities: No lower extremity edema.   LABS: Basic Metabolic Panel:  Recent Labs  07/09/14 0415 07/10/14 0316  NA 141 139  K 4.0 4.2  CL 103 101  CO2 24 24  GLUCOSE 204* 176*  BUN 34* 22  CREATININE 0.99 0.87  CALCIUM 10.8* 10.8*   CBC:  Recent Labs  07/09/14 0415 07/10/14 0316  WBC 13.0* 13.2*  HGB 10.7* 10.3*  HCT 34.4* 33.4*  MCV 80.2 80.7  PLT 212 166   Cardiac Enzymes:  Recent Labs  07/09/14 0026 07/09/14 0415 07/09/14 1048  TROPONINI <0.30 <0.30 <0.30   Fasting Lipid Panel: No results for input(s): CHOL, HDL, LDLCALC, TRIG, CHOLHDL, LDLDIRECT in the last 72 hours.  Current Meds: . acitretin  10 mg Oral Q supper  . amLODipine  10 mg Oral Daily  . aspirin EC  81 mg Oral Daily  . atorvastatin  20 mg Oral QHS  . bisacodyl  10 mg Rectal BID  . calcium-vitamin D  1 tablet Oral Q breakfast  . Chlorhexidine Gluconate Cloth  6 each Topical Q0600  . cloNIDine  0.1 mg Transdermal Weekly  . docusate sodium  200 mg Oral BID  . folic acid  1 mg Oral Daily  . gabapentin  300 mg Oral QID  . heparin  5,000 Units Subcutaneous 3 times per day  . hydrALAZINE  100 mg Oral TID  . insulin aspart  0-15 Units Subcutaneous TID WC  . insulin  glargine  20 Units Subcutaneous BID  . levofloxacin (LEVAQUIN) IV  750 mg Intravenous Q48H  . loratadine  10 mg Oral Daily  . methotrexate  7.5 mg Oral Q Wed  . montelukast  10 mg Oral Daily  . multivitamin with minerals  1 tablet Oral Daily  . mupirocin ointment  1 application Nasal BID  . pantoprazole  40 mg Oral Daily  . pneumococcal 23 valent vaccine  0.5 mL Intramuscular Tomorrow-1000  . polyethylene glycol  17 g Oral BID  . ranolazine  500 mg Oral BID  . senna  1 tablet Oral BID  . sodium chloride  3 mL Intravenous Q12H  . sodium chloride  3 mL Intravenous Q12H  . thiamine  100 mg Oral Daily   Echo 07/09/14: Left ventricle: The cavity size was normal. Wall thickness was increased in a pattern of moderate LVH. Systolic function was normal. The estimated ejection fraction was in the range of 60% to 65%. Wall motion was normal; there were no regional wall motion abnormalities. Doppler parameters are consistent with abnormal left ventricular relaxation (grade 1 diastolic dysfunction). - Aortic valve: Trileaflet; mildly calcified leaflets.  There was mild stenosis. Mean gradient (S): 12 mm Hg. - Mitral valve: There was trivial regurgitation. - Left atrium: The atrium was mildly to moderately dilated. - Right ventricle: The cavity size was normal. Systolic function was normal. - Tricuspid valve: Peak RV-RA gradient (S): 24 mm Hg. - Pulmonary arteries: PA peak pressure: 39 mm Hg (S). - Systemic veins: IVC measured 2.6 cm with < 50% respirophasic variation, suggesting RA pressure 15 mmHg. Impressions: - Normal LV size with moderate LV hypertrophy. EF 60-65%. Normal RV size and systolic function. Mild aortic stenosis. Mild pulmonary hypertension.   ASSESSMENT AND PLAN:  1. CAD/Chest pain: She is known to have CAD with last cath in February 2013 at which time her RCA was treated with a single DES. She was also noted to have severe disease in the distal  Circumflex that was not amenable to PCI. The LAD has mild disease. I have personally reviewed her cath films today. Nuclear myocardial perfusion study stress was normal in November of 2014. Now with new inverted T waves on EKG. Echo 07/09/14 with normal LV function, LVH, mild AS.  Stress test today as per plans of Dr. Meda Coffee. Continue ASA, atorvastatin and ranolazine. Atenolol was held secondary to bradycardia.   2. Acute on chronic diastolic heart failure: Volume status appears to be stable. Continue home dose of lasix 40 mg PO daily and spironolactone 25 mg po daily.  3. Essential hypertension: Uncontrolled after stopping atenolol. Clonidine was restarted. Will need to adjust meds before discharge.   4. Hyperlipidemia: On Lipitor 20 mg daily.    5. Bradycardia: Resolved off of atenolol and with correction of potasssium. Would not restart atenolol.   Theresa Mclean,Theresa Mclean  11/12/20157:58 AM

## 2014-07-10 NOTE — Progress Notes (Signed)
ANTIBIOTIC CONSULT NOTE - INITIAL  Pharmacy Consult for Levaquin Indication: pneumonia  No Known Allergies  Patient Measurements: Height: 5\' 4"  (162.6 cm) Weight: 214 lb 4.6 oz (97.2 kg) IBW/kg (Calculated) : 54.7  Vital Signs: Temp: 98.5 F (36.9 C) (11/12 1456) Temp Source: Oral (11/12 1456) BP: 157/46 mmHg (11/12 1456) Pulse Rate: 96 (11/12 1456) Intake/Output from previous day: 11/11 0701 - 11/12 0700 In: 120 [P.O.:120] Out: 2225 [Urine:2225] Intake/Output from this shift: Total I/O In: -  Out: 225 [Urine:225]  Labs:  Recent Labs  07/09/14 0026 07/09/14 0415 07/10/14 0316  WBC 12.4* 13.0* 13.2*  HGB 10.8* 10.7* 10.3*  PLT PLATELET CLUMPS NOTED ON SMEAR, UNABLE TO ESTIMATE 212 166  CREATININE 1.09 0.99 0.87   Estimated Creatinine Clearance: 55.5 mL/min (by C-G formula based on Cr of 0.87). No results for input(s): VANCOTROUGH, VANCOPEAK, VANCORANDOM, GENTTROUGH, GENTPEAK, GENTRANDOM, TOBRATROUGH, TOBRAPEAK, TOBRARND, AMIKACINPEAK, AMIKACINTROU, AMIKACIN in the last 72 hours.   Microbiology: Recent Results (from the past 720 hour(s))  MRSA PCR Screening     Status: Abnormal   Collection Time: 07/08/14  9:55 PM  Result Value Ref Range Status   MRSA by PCR POSITIVE (A) NEGATIVE Final    Comment:        The GeneXpert MRSA Assay (FDA approved for NASAL specimens only), is one component of a comprehensive MRSA colonization surveillance program. It is not intended to diagnose MRSA infection nor to guide or monitor treatment for MRSA infections. RESULT CALLED TO, READ BACK BY AND VERIFIED WITH: M.EVANGALISTA,RN AT 0119 AT 07/09/18 BY L.PITT     Medical History: Past Medical History  Diagnosis Date  . Hypertension   . Nephrolithiasis   . GERD (gastroesophageal reflux disease)   . Arthritis   . Psoriasis   . Morbid obesity   . Mitral regurgitation   . Hyperlipidemia   . Sleep apnea     uses cpap  . CHF (congestive heart failure)     Diastolic. EF  55-65% by cath 10/11/11  . Diabetes mellitus     insulin dependent  . Acute renal insufficiency     09/2011. not on ACEI secondary to this (also has renal artery stenosis)  . Peripheral vascular disease     severe right renal artery stenosis and left SFA stenosis by PV angio 09/2011, treated medically  . Cellulitis   . CAD (coronary artery disease)     NSTEMI 09/2011 felt poor candidate for CABG, instead s/p PTCA/DES to mid RCA 10/17/11  . Chronic respiratory failure   . Pulmonary hypertension   . Obstructive sleep apnea   . CKD (chronic kidney disease) stage 2, GFR 60-89 ml/min 07/11/2013    Medications: see electronic med rec  Assessment: 80 yo female transferred from [facility].  Bradycardic and hyperkalemic which has resolved today K=4.0 11/10 CXray Slight worsening bilateral perihilar opacification/interstitial prominence which may be due to mild interstitial edema versus interstitial pneumonitis.  11/11 Cxray Probable chronic interstitial fibrotic type change. No frank edema or consolidation. Plan:  Follow up culture results   Change to  Levaquin 750mg  iv q24h for crcl 60ml/min Will continue to follow.  Trenton Gammon, Ineta Sinning L 07/10/2014,3:32 PM

## 2014-07-10 NOTE — Progress Notes (Signed)
Came down to see patient in nuc med. When briefly assessing her she didn't seem to be a great historian. Talking with her further, she knows her name and knows she is having a test to take a picture of her heart. However, she cannot tell me where she is, what kind of place this is, or what the date is. I spoke with Dr. Meda Coffee who acknowledges the patient was A+O at time of consult. (Dr. Angelena Form is gone for the day.) CBG 170. VSS - pulse 79bpm, BP 155/48, Pox 96% on 2L. Temp 98.1 but mild temp elevation to 100* noted this admission. EKG looks the same as yesterday. I have notified Dr. Candiss Norse of the change. Per discussion with Dr. Meda Coffee, will hold off nuc for today until patient is clearer. Will re-order for tomorrow. I have made NPO after midnight and asked nursing to hold AM dose of Lantus. We may need to have her daughter consent for her. Further eval of mild confusion per IM. No other focal deficits on exam.  Tajae Maiolo PA-C

## 2014-07-10 NOTE — Progress Notes (Signed)
SLP Cancellation Note  Patient Details Name: Theresa Mclean MRN: [id number] DOB: 21-Feb-1929   Cancelled treatment:        Pt. NPO for stress test today.  Will continue to follow up for swallow safety and efficiency.   Houston Siren 07/10/2014, 11:02 AM   Orbie Pyo Colvin Caroli.[employer] [phone]

## 2014-07-10 NOTE — Progress Notes (Signed)
Physical Therapy Treatment Patient Details Name: [place] H Ken MRN: [id number] DOB: [birth year] Today's Date: 07/10/2014    History of Present Illness 80 y.o. female with multiple medical problems who presents as a transfer from [facility]. Pt noted to have bradycardia and hyperkalemia. Work up underway.     PT Comments    Patient progressing with mobility, able to tolerated some ambulation on supplemental O2 today. Ambulated 2 trials with seated rest break in between. Patient reports improvement in pain and swelling. Will continue to see and progress as tolerated.   Follow Up Recommendations  Home health PT;Supervision/Assistance - 24 hour     Equipment Recommendations  None recommended by PT    Recommendations for Other Services       Precautions / Restrictions Precautions Precautions: Fall Restrictions Weight Bearing Restrictions: No    Mobility  Bed Mobility Overal bed mobility: Needs Assistance Bed Mobility: Rolling;Sidelying to Sit Rolling: Min assist Sidelying to sit: Min assist       General bed mobility comments: able to power up with lesds assist today  Transfers Overall transfer level: Needs assistance Equipment used: Rolling walker (2 wheeled) Transfers: Sit to/from [facility] Sit to Stand: Mod assist Stand pivot transfers: Min assist       General transfer comment: increased assist to control descent to chair, VCs to push from bed to elevate to standing  Ambulation/Gait Ambulation/Gait assistance: Min assist Ambulation Distance (Feet): 16 Feet (performed 16' x 2 with seated rest break in between) Assistive device: Rolling walker (2 wheeled) Gait Pattern/deviations: Step-to pattern;Decreased stride length;Shuffle;Trunk flexed;Wide base of support Gait velocity: decreased Gait velocity interpretation: Below normal speed for age/gender General Gait Details: VCs for upright posture, assist for stability, improved with second  trial of ambulation. Seated rest break required, ambulated on 3 liters SpO2 >90%   Stairs            Wheelchair Mobility    Modified Rankin (Stroke Patients Only)       Balance     Sitting balance-Leahy Scale: Good     Standing balance support: During functional activity Standing balance-Leahy Scale: Fair                      Cognition Arousal/Alertness: Awake/alert Behavior During Therapy: WFL for tasks assessed/performed Overall Cognitive Status: No family/caregiver present to determine baseline cognitive functioning                      Exercises      General Comments        Pertinent Vitals/Pain Pain Assessment: No/denies pain Pain Score: 4  Pain Location: abdominal Pain Descriptors / Indicators: Aching Pain Intervention(s): Monitored during session    Home Living                      Prior Function            PT Goals (current goals can now be found in the care plan section) Acute Rehab PT Goals Patient Stated Goal: to return home with son assist PT Goal Formulation: With patient Time For Goal Achievement: 07/23/14 Potential to Achieve Goals: Fair Progress towards PT goals: Progressing toward goals    Frequency  Min 3X/week    PT Plan Current plan remains appropriate    Co-evaluation             End of Session Equipment Utilized During Treatment: Gait belt Activity Tolerance: Patient tolerated treatment  well;Patient limited by fatigue Patient left: in chair;with call bell/phone within reach (on bedside commode, nsg aware)     Time: 0802-0826 PT Time Calculation (min) (ACUTE ONLY): 24 min  Charges:  $Gait Training: 8-22 mins $Therapeutic Activity: 8-22 mins                    G CodesDuncan Dull 2014/08/01, 9:12 AM Alben Deeds, PT DPT  [phone]

## 2014-07-11 ENCOUNTER — Inpatient Hospital Stay (HOSPITAL_COMMUNITY): Payer: Medicare Other

## 2014-07-11 DIAGNOSIS — I5032 Chronic diastolic (congestive) heart failure: Secondary | ICD-10-CM

## 2014-07-11 DIAGNOSIS — R079 Chest pain, unspecified: Secondary | ICD-10-CM

## 2014-07-11 LAB — GLUCOSE, CAPILLARY
GLUCOSE-CAPILLARY: 160 mg/dL — AB (ref 70–99)
GLUCOSE-CAPILLARY: 164 mg/dL — AB (ref 70–99)
Glucose-Capillary: 254 mg/dL — ABNORMAL HIGH (ref 70–99)
Glucose-Capillary: 209 mg/dL — ABNORMAL HIGH (ref 70–99)
Glucose-Capillary: 186 mg/dL — ABNORMAL HIGH (ref 70–99)

## 2014-07-11 LAB — BLOOD GAS, ARTERIAL
Acid-Base Excess: 4.1 mmol/L — ABNORMAL HIGH (ref 0.0–2.0)
Bicarbonate: 27.6 mEq/L — ABNORMAL HIGH (ref 20.0–24.0)
FIO2: 0.21 %
O2 Saturation: 92.8 %
Patient temperature: 98.6
TCO2: 28.7 mmol/L (ref 0–100)
pCO2 arterial: 38 mmHg (ref 35.0–45.0)
pH, Arterial: 7.474 — ABNORMAL HIGH (ref 7.350–7.450)
pO2, Arterial: 66.3 mmHg — ABNORMAL LOW (ref 80.0–100.0)

## 2014-07-11 MED ORDER — GABAPENTIN 100 MG PO CAPS
200.0000 mg | ORAL_CAPSULE | Freq: Two times a day (BID) | ORAL | Status: DC
  Administered 2014-07-12 – 2014-07-14 (×5): 200 mg via ORAL
  Filled 2014-07-11 (×6): qty 2

## 2014-07-11 MED ORDER — SORBITOL 70 % SOLN
960.0000 mL | TOPICAL_OIL | Freq: Once | ORAL | Status: AC
  Administered 2014-07-11: 960 mL via RECTAL
  Filled 2014-07-11 (×2): qty 240

## 2014-07-11 MED ORDER — GLUCERNA SHAKE PO LIQD
237.0000 mL | Freq: Every day | ORAL | Status: DC
  Administered 2014-07-11 – 2014-07-13 (×3): 237 mL via ORAL

## 2014-07-11 NOTE — Progress Notes (Signed)
Placed patient on CPAP auto mode 5-10cmH20 2ith 2L 02 blleed in.  Patient is tolerating well at this time.

## 2014-07-11 NOTE — Progress Notes (Signed)
INITIAL NUTRITION ASSESSMENT  DOCUMENTATION CODES Per approved criteria  -Morbid Obesity   INTERVENTION: Glucerna Shake po daily, each supplement provides 220 kcal and 10 grams of protein RD to follow for nutrition care plan  NUTRITION DIAGNOSIS: Inadequate oral intake related to decreased appetite as evidenced by pt report  Goal: Pt to meet >/= 90% of their estimated nutrition needs   Monitor:  PO & supplemental intake, weight, labs, I/O's  Reason for Assessment: Low Braden  80 y.o. female  Admitting Dx: bradycardia, hyperkalemia  ASSESSMENT: 80 y.o. Female with multiple medical problems presented as a transfer from [facility]; noted to have bradycardia, hyperkalemia and vague abdominal pain -- likely gastroenteritis.  Patient on bedside commode upon RD visit.  Limited nutrition hx obtained.  Does report a decreased appetite and that she's not hungry.  Currently on a Carbohydrate Modified diet.  PO intake 50% per flowsheet records.  Amenable to oral nutrition supplements.  RD to order.  Low braden score places patient at risk for skin breakdown.  No muscle or subcutaneous fat depletion noticed.  Height: Ht Readings from Last 1 Encounters:  07/09/14 5\' 4"  (1.626 m)    Weight: Wt Readings from Last 1 Encounters:  07/11/14 216 lb 0.8 oz (98 kg)    Ideal Body Weight: 120 lb  % Ideal Body Weight: 180%  Wt Readings from Last 10 Encounters:  07/11/14 216 lb 0.8 oz (98 kg)  05/26/14 219 lb (99.338 kg)  02/07/14 213 lb (96.616 kg)  11/24/13 216 lb (97.977 kg)  11/14/13 216 lb (97.977 kg)  08/16/13 222 lb 14.2 oz (101.1 kg)  07/12/13 220 lb 12.8 oz (100.154 kg)  01/19/12 216 lb 12.8 oz (98.34 kg)  10/28/11 225 lb (102.059 kg)  10/20/11 223 lb 1.7 oz (101.2 kg)    Usual Body Weight: 219 lb  % Usual Body Weight: 98%  BMI:  Body mass index is 37.07 kg/(m^2).  Estimated Nutritional Needs: Kcal: 1700-1900 Protein: 90-100 gm Fluid: 1.7-1.9 L  Skin:  Intact  Diet Order: Diet Carb Modified  EDUCATION NEEDS: -No education needs identified at this time   Intake/Output Summary (Last 24 hours) at 07/11/14 1558 Last data filed at 07/11/14 1147  Gross per 24 hour  Intake    390 ml  Output    750 ml  Net   -360 ml    Labs:   Recent Labs Lab 07/09/14 0026 07/09/14 0415 07/10/14 0316  NA 137 141 139  K 4.3 4.0 4.2  CL 101 103 101  CO2 20 24 24   BUN 36* 34* 22  CREATININE 1.09 0.99 0.87  CALCIUM 10.6* 10.8* 10.8*  GLUCOSE 189* 204* 176*    CBG (last 3)   Recent Labs  07/10/14 2228 07/11/14 0804 07/11/14 1201  GLUCAP 254* 160* 164*    Scheduled Meds: . acitretin  10 mg Oral Q supper  . amLODipine  10 mg Oral Daily  . aspirin EC  81 mg Oral Daily  . atorvastatin  20 mg Oral QHS  . bisacodyl  10 mg Rectal BID  . calcium-vitamin D  1 tablet Oral Q breakfast  . Chlorhexidine Gluconate Cloth  6 each Topical Q0600  . cloNIDine  0.1 mg Transdermal Weekly  . docusate sodium  200 mg Oral BID  . folic acid  1 mg Oral Daily  . [START ON 07/12/2014] gabapentin  200 mg Oral BID  . heparin  5,000 Units Subcutaneous 3 times per day  . hydrALAZINE  100 mg  Oral TID  . insulin aspart  0-15 Units Subcutaneous TID WC  . insulin glargine  22 Units Subcutaneous BID  . levofloxacin (LEVAQUIN) IV  750 mg Intravenous Q24H  . methotrexate  7.5 mg Oral Q Wed  . montelukast  10 mg Oral Daily  . multivitamin with minerals  1 tablet Oral Daily  . mupirocin ointment  1 application Nasal BID  . pantoprazole  40 mg Oral Daily  . polyethylene glycol  17 g Oral BID  . ranolazine  500 mg Oral BID  . senna  1 tablet Oral BID  . sodium chloride  3 mL Intravenous Q12H  . thiamine  100 mg Oral Daily    Continuous Infusions:   Past Medical History  Diagnosis Date  . Hypertension   . Nephrolithiasis   . GERD (gastroesophageal reflux disease)   . Arthritis   . Psoriasis   . Morbid obesity   . Mitral regurgitation   . Hyperlipidemia    . Sleep apnea     uses cpap  . CHF (congestive heart failure)     Diastolic. EF 55-65% by cath 10/11/11  . Diabetes mellitus     insulin dependent  . Acute renal insufficiency     09/2011. not on ACEI secondary to this (also has renal artery stenosis)  . Peripheral vascular disease     severe right renal artery stenosis and left SFA stenosis by PV angio 09/2011, treated medically  . Cellulitis   . CAD (coronary artery disease)     NSTEMI 09/2011 felt poor candidate for CABG, instead s/p PTCA/DES to mid RCA 10/17/11  . Chronic respiratory failure   . Pulmonary hypertension   . Obstructive sleep apnea   . CKD (chronic kidney disease) stage 2, GFR 60-89 ml/min 07/11/2013    Past Surgical History  Procedure Laterality Date  . Acromio-clavicular joint repair  2011  . Abdominal hysterectomy    . Eye surgery      cataract  OD    Arthur Holms, RD, LDN Pager #: [phone] After-Hours Pager #: [phone]

## 2014-07-11 NOTE — Progress Notes (Signed)
Patient stated she did not want to wear CPAP tonight.  Patient aware to have RN call RT if she changes her mind.  RN aware.

## 2014-07-11 NOTE — Progress Notes (Signed)
SUBJECTIVE: Denies chest pain or SOB.   BP 150/44 mmHg  Pulse 84  Temp(Src) 99.3 F (37.4 C) (Oral)  Resp 22  Ht 5\' 4"  (1.626 m)  Wt 216 lb 0.8 oz (98 kg)  BMI 37.07 kg/m2  SpO2 95%  LMP 08/29/1990  Intake/Output Summary (Last 24 hours) at 07/11/14 1047 Last data filed at 07/10/14 2230  Gross per 24 hour  Intake    390 ml  Output    450 ml  Net    -60 ml    PHYSICAL EXAM General: Well developed, well nourished, in no acute distress. Alert and oriented x 3. Somnolent  Psych:  Good affect, responds appropriately Neck: No JVD. No masses noted.  Lungs: Clear bilaterally with no wheezes or rhonci noted.  Heart: RRR with no murmurs noted. Abdomen: Bowel sounds are present. Soft, non-tender.  Extremities: No lower extremity edema.   LABS: Basic Metabolic Panel:  Recent Labs  07/09/14 0415 07/10/14 0316  NA 141 139  K 4.0 4.2  CL 103 101  CO2 24 24  GLUCOSE 204* 176*  BUN 34* 22  CREATININE 0.99 0.87  CALCIUM 10.8* 10.8*   CBC:  Recent Labs  07/09/14 0415 07/10/14 0316  WBC 13.0* 13.2*  HGB 10.7* 10.3*  HCT 34.4* 33.4*  MCV 80.2 80.7  PLT 212 166   Cardiac Enzymes:  Recent Labs  07/09/14 0026 07/09/14 0415 07/09/14 1048  TROPONINI <0.30 <0.30 <0.30   Current Meds: . acitretin  10 mg Oral Q supper  . amLODipine  10 mg Oral Daily  . aspirin EC  81 mg Oral Daily  . atorvastatin  20 mg Oral QHS  . bisacodyl  10 mg Rectal BID  . calcium-vitamin D  1 tablet Oral Q breakfast  . Chlorhexidine Gluconate Cloth  6 each Topical Q0600  . cloNIDine  0.1 mg Transdermal Weekly  . docusate sodium  200 mg Oral BID  . folic acid  1 mg Oral Daily  . [START ON 07/12/2014] gabapentin  200 mg Oral BID  . heparin  5,000 Units Subcutaneous 3 times per day  . hydrALAZINE  100 mg Oral TID  . insulin aspart  0-15 Units Subcutaneous TID WC  . insulin glargine  22 Units Subcutaneous BID  . levofloxacin (LEVAQUIN) IV  750 mg Intravenous Q24H  . methotrexate  7.5  mg Oral Q Wed  . montelukast  10 mg Oral Daily  . multivitamin with minerals  1 tablet Oral Daily  . mupirocin ointment  1 application Nasal BID  . pantoprazole  40 mg Oral Daily  . polyethylene glycol  17 g Oral BID  . ranolazine  500 mg Oral BID  . senna  1 tablet Oral BID  . sodium chloride  3 mL Intravenous Q12H  . thiamine  100 mg Oral Daily   Echo 07/09/14: Left ventricle: The cavity size was normal. Wall thickness was increased in a pattern of moderate LVH. Systolic function was normal. The estimated ejection fraction was in the range of 60% to 65%. Wall motion was normal; there were no regional wall motion abnormalities. Doppler parameters are consistent with abnormal left ventricular relaxation (grade 1 diastolic dysfunction). - Aortic valve: Trileaflet; mildly calcified leaflets. There was mild stenosis. Mean gradient (S): 12 mm Hg. - Mitral valve: There was trivial regurgitation. - Left atrium: The atrium was mildly to moderately dilated. - Right ventricle: The cavity size was normal. Systolic function was normal. - Tricuspid valve: Peak RV-RA  gradient (S): 24 mm Hg. - Pulmonary arteries: PA peak pressure: 39 mm Hg (S). - Systemic veins: IVC measured 2.6 cm with < 50% respirophasic variation, suggesting RA pressure 15 mmHg. Impressions: - Normal LV size with moderate LV hypertrophy. EF 60-65%. Normal RV size and systolic function. Mild aortic stenosis. Mild pulmonary hypertension.   ASSESSMENT AND PLAN:  1. CAD/Chest pain: Chest pain has not recurred last 24 hours. She is known to have CAD with last cath in February 2013 at which time her RCA was treated with a single DES. She was also noted to have severe disease in the distal Circumflex that was not amenable to PCI. The LAD has mild disease. Nuclear myocardial perfusion study stress was normal in November of 2014. Now with new inverted T waves on EKG. Echo 07/09/14 with normal LV function, LVH,  mild AS. Stress test had been planned on 07/10/14 but pt with acute confusion so stress test was cancelled. Her confusion has resolved. CT head ok this am. She is telling me this am that she does not wish to undergo a stress test. Since her echo shows normal LV function and her chest pain has resolved and no evidence of ACS, will cancel stress testing. Continue ASA, atorvastatin and ranolazine. Atenolol was held secondary to bradycardia. At this point, would continue medical management of CAD. If she has recurrent chest pain, will have to consider stress testing next week or as an outpatient.   2. Acute on chronic diastolic heart failure: Volume status appears to be stable. Continue home dose of lasix 40 mg PO daily and spironolactone 25 mg po daily.  3. Bradycardia: Resolved off of atenolol and with correction of potasssium. Would not restart atenolol.     MCALHANY,CHRISTOPHER  11/13/201510:47 AM

## 2014-07-11 NOTE — Care Management Note (Signed)
    Page 1 of 2   07/11/2014     3:34:53 PM CARE MANAGEMENT NOTE 07/11/2014  Patient:  Theresa, Mclean   Account Number:  [id number]  Date Initiated:  07/09/2014  Documentation initiated by:  [facility]  Subjective/Objective Assessment:   Admitted from outside ER with Hyerkalemia, brady and fever.     Action/Plan:   Anticipated DC Date:  07/14/2014   Anticipated DC Plan:  [place]  CM consult      Choice offered to / List presented to:             Status of service:  In process, will continue to follow Medicare Important Message given?  YES (If response is "NO", the following Medicare IM given date fields will be blank) Date Medicare IM given:  07/11/2014 Medicare IM given by:  [facility] Date Additional Medicare IM given:   Additional Medicare IM given by:    Discharge Disposition:    Per UR Regulation:  Reviewed for med. necessity/level of care/duration of stay  If discussed at [place] of Stay Meetings, dates discussed:    Comments:  ContactSuezanne Cheshire Daughter [phone]    Cutbirth,Thomas Son [phone]  [phone]  07-11-14  3:15pm Luz Lex, RNBSN - [phone] Sitting up in chair.  Awake and alert.  States lives with son in [place], [place].  States son is sick and does not work. States she does not care for him  - he is able to care for self and prepares their meals.  Her desire is to go back home.  She would like assistance - Has never had [place] prior. Explained she would be eligible for at least [facility] PT - could probably use a NT also.  Will need face to face and [place] orders placed.   CM will continue to follow. Options for HH in [place] appear to be available out of [facility] Team [place] [address] [phone]  [facility] [address] [phone]  [facility] [address] [phone]

## 2014-07-11 NOTE — Progress Notes (Addendum)
Patient Demographics  Theresa Mclean, is a 80 y.o. female, DOB - 1928-09-25, GNO:[id number]  Admit date - 07/08/2014   Admitting Physician Ripudeep Krystal Eaton, MD  Outpatient Primary MD for the patient is [name],[name], MD  LOS - 3   No chief complaint on file.     Brief summary  80 year old African-American female who was transferred from [facility] where she presented with abdominal pain and nausea, she was found to have hyperkalemia along with bradycardia, she was sent to [facility] for admission. Here workup revealed large stool burden on abdominal x-ray, EKG changes suggestive of T-wave inversion, cardiology was consulted. Bowel regimen was initiated. There was also suggestion of possible URI versus early pneumonia.  Subjective:   Theresa Rappaport today has, No headache, No chest pain, No abdominal pain - No Nausea, No new weakness tingling or numbness, No Cough - SOB.    Assessment & Plan    1. Abdominal pain nausea vomiting. Likely gastroenteritis and currently symptom-free - x-ray shows evidence of stool burden, now symptom-free, bowel regimen initiated, 1 time SMOG enema.   2. Bradycardia and hyperkalemia upon arrival to [facility] ER. Currently in sinus rhythm, beta blocker discontinued, hyperkalemia corrected, monitor on telemetry, obtain echogram, TSH is stable, cardiology following, rate stable.   3. CAD - T-wave inversion in lateral leads on EKG. Chest pain-free. Troponin negative , beta blocker held due to bradycardia, echo without any wall motion abnormality and a preserved EF of 60%, cardiology consulted. Continue aspirin and statin. She is also on Ranexa which will be continued. As needed sublingual nitroglycerin. Cardiology has ordered Lexiscan stress test on 07/10/2014, which patient  is refusing.    4.DM type II. Continue Lantus and sliding scale check A1c, monitor CBGs.  CBG (last 3)   Recent Labs  07/10/14 1452 07/10/14 2228 07/11/14 0804  GLUCAP 191* 254* 160*     Lab Results  Component Value Date   HGBA1C 8.2* 07/09/2014     5. Essential hypertension - Continue Norvasc, increased hydralazine and Catapres added by cardiology for better control. Beta blocker held due to bradycardia.      6. Possible CAP . Afebrile, mild leukocytosis, place on Levaquin, speech eval and monitor.    7. Chr diastolic congestive heart failure Ef 60% - compensated this admission.     8.GERD - PPI    9.Diabetic neuropathy. Continue Neurontin dose reduced due to somnolence.    10. Indwelling Foley catheter. Placed recently at [facility]. Was removed yesterday. We will monitor postvoid residuals.    11. OSA. Daily at bedtime CPAP. Non-compliant with C Pap. Did not wear last night and became somnolent, she was actually hypoxic and did not retain CO2, with one oral C Pap woke up nicely but again now refuses wearing it. Patient counseled in warned about adverse consequences including a respiratorry arrest and death she understands it. Family will be informed.      Code Status: Full  Family Communication: daughter Thayer Headings over the phone - Informed about noncompliance with C Pap and refusal to participate and stress test, informed her this can result in death or disability. She will arrange for someone to talk to the patient  Disposition Plan:  TBD   Procedures  Echo,  [place] pending   Consults   Cardiology   Medications  Scheduled Meds: . acitretin  10 mg Oral Q supper  . amLODipine  10 mg Oral Daily  . aspirin EC  81 mg Oral Daily  . atorvastatin  20 mg Oral QHS  . bisacodyl  10 mg Rectal BID  . calcium-vitamin D  1 tablet Oral Q breakfast  . Chlorhexidine Gluconate Cloth  6 each Topical Q0600  . cloNIDine  0.1 mg Transdermal Weekly  . docusate  sodium  200 mg Oral BID  . folic acid  1 mg Oral Daily  . [START ON 07/12/2014] gabapentin  200 mg Oral BID  . heparin  5,000 Units Subcutaneous 3 times per day  . hydrALAZINE  100 mg Oral TID  . insulin aspart  0-15 Units Subcutaneous TID WC  . insulin glargine  22 Units Subcutaneous BID  . levofloxacin (LEVAQUIN) IV  750 mg Intravenous Q24H  . methotrexate  7.5 mg Oral Q Wed  . montelukast  10 mg Oral Daily  . multivitamin with minerals  1 tablet Oral Daily  . mupirocin ointment  1 application Nasal BID  . pantoprazole  40 mg Oral Daily  . polyethylene glycol  17 g Oral BID  . ranolazine  500 mg Oral BID  . senna  1 tablet Oral BID  . sodium chloride  3 mL Intravenous Q12H  . thiamine  100 mg Oral Daily   Continuous Infusions:  PRN Meds:.acetaminophen **OR** [DISCONTINUED] acetaminophen, HYDROcodone-acetaminophen, methocarbamol, morphine injection, nitroGLYCERIN, [DISCONTINUED] ondansetron **OR** ondansetron (ZOFRAN) IV  DVT Prophylaxis    Heparin    Lab Results  Component Value Date   PLT 166 07/10/2014    Antibiotics     Anti-infectives    Start     Dose/Rate Route Frequency Ordered Stop   07/10/14 1500  levofloxacin (LEVAQUIN) IVPB 750 mg     750 mg100 mL/hr over 90 Minutes Intravenous Every 24 hours 07/10/14 1330     07/09/14 0900  levofloxacin (LEVAQUIN) IVPB 750 mg  Status:  Discontinued     750 mg100 mL/hr over 90 Minutes Intravenous Every 48 hours 07/09/14 0814 07/10/14 1330          Objective:   Filed Vitals:   07/11/14 0000 07/11/14 0009 07/11/14 0422 07/11/14 0805  BP: 161/44 161/44 153/41 150/44  Pulse:  85 85 84  Temp:  99.1 F (37.3 C) 99.6 F (37.6 C) 99.3 F (37.4 C)  TempSrc:  Oral Oral Oral  Resp:  26 25 22   Height:      Weight:   98 kg (216 lb 0.8 oz)   SpO2:  95% 95% 95%    Wt Readings from Last 3 Encounters:  07/11/14 98 kg (216 lb 0.8 oz)  05/26/14 99.338 kg (219 lb)  02/07/14 96.616 kg (213 lb)     Intake/Output Summary (Last  24 hours) at 07/11/14 1047 Last data filed at 07/10/14 2230  Gross per 24 hour  Intake    390 ml  Output    450 ml  Net    -60 ml     Physical Exam  Awake Alert, Oriented X 2, No new F.N deficits, Normal affect [place].AT,PERRAL Supple Neck,No JVD, No cervical lymphadenopathy appriciated.  Symmetrical Chest wall movement, Good air movement bilaterally, bibasilar rales L>R RRR,No Gallops,Rubs or new Murmurs, No Parasternal Heave +ve B.Sounds, Abd Soft, No tenderness, No organomegaly appriciated, No rebound - guarding or rigidity. No Cyanosis, Clubbing or edema, No new Rash  or bruise      Data Review   Micro Results Recent Results (from the past 240 hour(s))  MRSA PCR Screening     Status: Abnormal   Collection Time: 07/08/14  9:55 PM  Result Value Ref Range Status   MRSA by PCR POSITIVE (A) NEGATIVE Final    Comment:        The GeneXpert MRSA Assay (FDA approved for NASAL specimens only), is one component of a comprehensive MRSA colonization surveillance program. It is not intended to diagnose MRSA infection nor to guide or monitor treatment for MRSA infections. RESULT CALLED TO, READ BACK BY AND VERIFIED WITH: M.EVANGALISTA,RN AT 0119 AT 07/09/18 BY L.PITT     Radiology Reports Ct Head Wo Contrast  07/11/2014   CLINICAL DATA:  Confusion and headache; nausea and vomit  EXAM: CT HEAD WITHOUT CONTRAST  TECHNIQUE: Contiguous axial images were obtained from the base of the skull through the vertex without intravenous contrast.  COMPARISON:  July 08, 2014  FINDINGS: Age related volume loss is stable. There is no mass, hemorrhage, extra-axial fluid collection, or midline shift. Mild small vessel disease in the centra semiovale bilaterally is stable. There is no new gray-white compartment lesion. No acute infarct present. Basal ganglia calcification is felt to be physiologic in this age group. There is frontal hyperostosis bilaterally. The bony calvarium appears intact. The  mastoid air cells are clear.  IMPRESSION: Age related volume loss with mild supratentorial small vessel disease. No intracranial mass, hemorrhage, or acute appearing infarct.   Electronically Signed   By: Lowella Grip M.D.   On: 07/11/2014 09:48   Dg Chest Port 1 View  07/08/2014   CLINICAL DATA:  Congestive heart failure.  EXAM: PORTABLE CHEST - 1 VIEW  COMPARISON:  07/08/2014 earlier and 08/12/2013  FINDINGS: Lungs are hypoinflated and demonstrate slight worsening bilateral perihilar opacification/interstitial prominence. No definite effusion. Stable cardiomegaly. Remainder the exam is unchanged.  IMPRESSION: Slight worsening bilateral perihilar opacification/interstitial prominence which may be due to mild interstitial edema versus interstitial pneumonitis.  Stable cardiomegaly.   Electronically Signed   By: Marin Olp M.D.   On: 07/08/2014 23:42   Dg Abd Acute W/chest  07/09/2014   CLINICAL DATA:  Difficulty breathing ; abdominal pain  EXAM: ACUTE ABDOMEN SERIES (ABDOMEN 2 VIEW & CHEST 1 VIEW)  COMPARISON:  Chest radiograph July 08, 2014 and July 09, 2013 ; CT abdomen and pelvis July 08, 2014  FINDINGS: PA chest: There is scarring in the left base. There is stable generalized interstitial prominence, probably reflecting chronic interstitial fibrosis. No new opacity seen. Heart is mildly enlarged with pulmonary vascularity within normal limits. No adenopathy.  Supine and left lateral decubitus abdomen: There is diffuse stool throughout the colon. There is no bowel dilatation or air-fluid level to suggest obstruction. No free air. There are multiple phleboliths in the pelvis. Calcifications are noted to the right of L4 and L5. Suspect small calcified mesenteric lymph nodes are possible phleboliths.  IMPRESSION: Diffuse stool in colon. Bowel gas pattern unremarkable. Probable chronic interstitial fibrotic type change. No frank edema or consolidation. Stable mild cardiac enlargement.    Electronically Signed   By: Lowella Grip M.D.   On: 07/09/2014 13:21     CBC  Recent Labs Lab 07/09/14 0026 07/09/14 0415 07/10/14 0316  WBC 12.4* 13.0* 13.2*  HGB 10.8* 10.7* 10.3*  HCT 34.5* 34.4* 33.4*  PLT PLATELET CLUMPS NOTED ON SMEAR, UNABLE TO ESTIMATE 212 166  MCV 81.0 80.2 80.7  MCH 25.4* 24.9* 24.9*  MCHC 31.3 31.1 30.8  RDW 19.5* 19.7* 19.9*    Chemistries   Recent Labs Lab 07/09/14 0026 07/09/14 0415 07/10/14 0316  NA 137 141 139  K 4.3 4.0 4.2  CL 101 103 101  CO2 20 24 24   GLUCOSE 189* 204* 176*  BUN 36* 34* 22  CREATININE 1.09 0.99 0.87  CALCIUM 10.6* 10.8* 10.8*  AST 15 15  --   ALT 24 24  --   ALKPHOS 69 66  --   BILITOT 0.5 0.6  --    ------------------------------------------------------------------------------------------------------------------ estimated creatinine clearance is 55.7 mL/min (by C-G formula based on Cr of 0.87). ------------------------------------------------------------------------------------------------------------------  Recent Labs  07/09/14 1048  HGBA1C 8.2*   ------------------------------------------------------------------------------------------------------------------ No results for input(s): CHOL, HDL, LDLCALC, TRIG, CHOLHDL, LDLDIRECT in the last 72 hours. ------------------------------------------------------------------------------------------------------------------  Recent Labs  07/09/14 0026  TSH 2.120   ------------------------------------------------------------------------------------------------------------------ No results for input(s): VITAMINB12, FOLATE, FERRITIN, TIBC, IRON, RETICCTPCT in the last 72 hours.  Coagulation profile  Recent Labs Lab 07/09/14 0415  INR 1.11    No results for input(s): DDIMER in the last 72 hours.  Cardiac Enzymes  Recent Labs Lab 07/09/14 0026 07/09/14 0415 07/09/14 1048  TROPONINI <0.30 <0.30 <0.30    ------------------------------------------------------------------------------------------------------------------ Invalid input(s): POCBNP     Time Spent in minutes   35   Torryn Hudspeth K M.D on 07/11/2014 at 10:47 AM  Between 7am to 7pm - Pager - [phone]  After 7pm go to [email or web address] - password [email or web address]  And look for the night coverage person covering for me after hours  [facility] Office  [phone]

## 2014-07-11 NOTE — Progress Notes (Signed)
Per request of Dr SinghPatient's daughter Suezanne Cheshire notified by telephone  that patient refuses to wear c- pap. Education given to daughter in regards to the importance of patient wearing c pap. Daughter verbalizes understanding. Teresita Madura RN

## 2014-07-11 NOTE — Progress Notes (Signed)
  Speech Language Pathology Treatment: Dysphagia  Patient Details Name: Theresa Mclean MRN: [id number] DOB: 06-23-1929 Today's Date: 07/11/2014 Time: 1011-1020 SLP Time Calculation (min) (ACUTE ONLY): 9 min  Assessment / Plan / Recommendation Clinical Impression  RN reported pt. exhibited decreased responsiveness, not speaking or following commands this am.  Head CT negative.  Pt. Returned to room with RN present; pt. verbally responding to questions (suspect with delayed responses- this SLP not  familiar with pt.), following commands.  She continues with NPO due to stress test pending.  Pt. Noted to have frequent throat clearing indicative of possible pharyngeal or esophageal etiology. Given min verbal cueing pt. unable to produce volitional cough or initiate dry swallow given cues for "throat clear then swallow" to facilitate initiation.  If diet re-ordered, recommend adequate alertness.  If overt s/s aspiration, contact ST.  ST will continue to follow.    HPI HPI: Theresa H Speigner is an 80 y.o. female with multiple medical problems who presents as a transfer from [facility]. Patient presented to [facility] not feeling well. She was noted to have bradycardia and also was noted to have hyperkalemia. Patient states that she has been having some vague abdominal pain. Pt reports needing her esophagus stretched a few years ago and has recently been having the sensation of foods sticking in her distal esophagus with associated pain. CXR with possible mild interstitial edema versus interstitial pneumonitis.   Pertinent Vitals Pain Assessment: No/denies pain  SLP Plan  Continue with current plan of care    Recommendations Diet recommendations: Regular;Thin liquid (see impression statement) Liquids provided via: Cup;No straw Medication Administration: Whole meds with puree Supervision: Patient able to self feed;Full supervision/cueing for compensatory strategies Compensations: Slow rate;Small  sips/bites;Follow solids with liquid Postural Changes and/or Swallow Maneuvers: Seated upright 90 degrees;Upright 30-60 min after meal              Oral Care Recommendations: Oral care BID Follow up Recommendations:  (TBD) Plan: Continue with current plan of care         Houston Siren 07/11/2014, 10:32 AM   Orbie Pyo Colvin Caroli.Ed [employer] [phone]

## 2014-07-11 NOTE — Progress Notes (Signed)
Placed pt on cpap of 12 with 2l o2 bled in around 8 am this morning. She wore cpap for about 1 hour. She is now refusing to wear it. She is much more alert and awake.

## 2014-07-12 ENCOUNTER — Other Ambulatory Visit (HOSPITAL_COMMUNITY): Payer: Medicare Other

## 2014-07-12 DIAGNOSIS — R079 Chest pain, unspecified: Secondary | ICD-10-CM | POA: Insufficient documentation

## 2014-07-12 LAB — GLUCOSE, CAPILLARY
GLUCOSE-CAPILLARY: 172 mg/dL — AB (ref 70–99)
Glucose-Capillary: 159 mg/dL — ABNORMAL HIGH (ref 70–99)
Glucose-Capillary: 176 mg/dL — ABNORMAL HIGH (ref 70–99)
Glucose-Capillary: 202 mg/dL — ABNORMAL HIGH (ref 70–99)

## 2014-07-12 LAB — COMPREHENSIVE METABOLIC PANEL
ALBUMIN: 3 g/dL — AB (ref 3.5–5.2)
ALK PHOS: 59 U/L (ref 39–117)
ALT: 18 U/L (ref 0–35)
AST: 18 U/L (ref 0–37)
Anion gap: 14 (ref 5–15)
BILIRUBIN TOTAL: 0.7 mg/dL (ref 0.3–1.2)
BUN: 11 mg/dL (ref 6–23)
CHLORIDE: 101 meq/L (ref 96–112)
CO2: 24 meq/L (ref 19–32)
Calcium: 10.8 mg/dL — ABNORMAL HIGH (ref 8.4–10.5)
Creatinine, Ser: 0.76 mg/dL (ref 0.50–1.10)
GFR calc Af Amer: 88 mL/min — ABNORMAL LOW (ref >90)
GFR, EST NON AFRICAN AMERICAN: 76 mL/min — AB (ref >90)
Glucose, Bld: 113 mg/dL — ABNORMAL HIGH (ref 70–99)
POTASSIUM: 3.8 meq/L (ref 3.7–5.3)
Sodium: 139 mEq/L (ref 137–147)
Total Protein: 7.1 g/dL (ref 6.0–8.3)

## 2014-07-12 LAB — CBC
HCT: 32.4 % — ABNORMAL LOW (ref 36.0–46.0)
Hemoglobin: 10.1 g/dL — ABNORMAL LOW (ref 12.0–15.0)
MCH: 25.4 pg — ABNORMAL LOW (ref 26.0–34.0)
MCHC: 31.2 g/dL (ref 30.0–36.0)
MCV: 81.6 fL (ref 78.0–100.0)
PLATELETS: 193 10*3/uL (ref 150–400)
RBC: 3.97 MIL/uL (ref 3.87–5.11)
RDW: 20 % — AB (ref 11.5–15.5)
WBC: 8.2 10*3/uL (ref 4.0–10.5)

## 2014-07-12 MED ORDER — LEVOFLOXACIN 500 MG PO TABS
500.0000 mg | ORAL_TABLET | Freq: Every day | ORAL | Status: DC
  Administered 2014-07-12: 500 mg via ORAL
  Filled 2014-07-12: qty 1

## 2014-07-12 MED ORDER — SPIRONOLACTONE 25 MG PO TABS
25.0000 mg | ORAL_TABLET | Freq: Every day | ORAL | Status: DC
  Administered 2014-07-12 – 2014-07-14 (×3): 25 mg via ORAL
  Filled 2014-07-12 (×3): qty 1

## 2014-07-12 MED ORDER — FUROSEMIDE 40 MG PO TABS
40.0000 mg | ORAL_TABLET | Freq: Every day | ORAL | Status: DC
Start: 2014-07-12 — End: 2014-07-14
  Administered 2014-07-12 – 2014-07-14 (×3): 40 mg via ORAL
  Filled 2014-07-12 (×3): qty 1

## 2014-07-12 MED ORDER — LEVOFLOXACIN 750 MG PO TABS
750.0000 mg | ORAL_TABLET | Freq: Every day | ORAL | Status: DC
  Administered 2014-07-13 – 2014-07-14 (×2): 750 mg via ORAL
  Filled 2014-07-12 (×2): qty 1

## 2014-07-12 NOTE — Progress Notes (Signed)
Nurse called report to [facility] on 6E.  Pt was alert and oriented prior to transfer without questions or concerns. Vital signs prior to transport are documented in Epic.   Pts belongings including home CPAP were carried with pt to new room.  Nurse contacted daughter to inform of new room.

## 2014-07-12 NOTE — Progress Notes (Addendum)
Patient Demographics  Theresa Mclean, is a 80 y.o. female, DOB - [birth year], YWV:[id number]  Admit date - 07/08/2014   Admitting Physician Ripudeep Krystal Eaton, MD  Outpatient Primary MD for the patient is [name],[name], MD  LOS - 4   No chief complaint on file.     Brief summary  80 year old African-American female who was transferred from [facility] where she presented with abdominal pain and nausea, she was found to have hyperkalemia along with bradycardia, she was sent to [facility] for admission. Here workup revealed large stool burden on abdominal x-ray, EKG changes suggestive of T-wave inversion, cardiology was consulted. Bowel regimen was initiated. There was also suggestion of possible URI versus early pneumonia.  Subjective:   Theresa Mclean today has, No headache, No chest pain, No abdominal pain - No Nausea, No new weakness tingling or numbness, No Cough - SOB.    Assessment & Plan    1. Abdominal pain nausea vomiting.  -resolved, Likely gastroenteritis, however x-ray shows evidence of stool burden, -bowel regimen initiated, given SMOG enema. -continue dulcolax, senna and miralax  2. Bradycardia and hyperkalemia upon arrival to [facility] ER. - Currently in sinus rhythm, beta blocker discontinued, hyperkalemia corrected, monitor on telemetry,  - ECHo with normal EF and no wall motion abnormalities - TSH is stable, cardiology following, rate stable.  3. CAD - T-wave inversion in lateral leads on EKG. Chest pain-free. Troponin negative , beta blocker held due to bradycardia, echo without any wall motion abnormality and a preserved EF of 60%, cardiology following -Continue aspirin, statin, Ranexa, PRN sublingual nitroglycerin. - Cardiology has ordered Lexiscan stress test on 07/10/2014,  which patient was refusing, underwent Resting scan yesterday, finally agreeable to Rest of Stress testing today  4.DM type II. Continue Lantus and sliding scale, habic 8.2  CBG (last 3)   Recent Labs  07/11/14 1201 07/11/14 1627 07/11/14 2205  GLUCAP 164* 209* 186*   5. Essential hypertension - Continue Norvasc, increased hydralazine and Catapres added by cardiology for better control. Beta blocker held due to bradycardia.    6. Possible CAP . Afebrile, mild leukocytosis, placed on Levaquin, speech eval and monitor. -change to PO  7. Chr diastolic congestive heart failure Ef 60% - compensated this admission.  -resume lasix and aldactone  8.GERD - PPI  9.Diabetic neuropathy. Continue Neurontin dose reduced due to somnolence.  10. Indwelling Foley catheter. Placed recently at [facility]. Was removed 11/12, able to void  11. OSA. Daily at bedtime CPAP. Non-compliant with C Pap. Did not wear last night and became somnolent, she was actually hypoxic and did not retain CO2, with one oral C Pap woke up nicely but again now refuses wearing it. Patient counseled in warned about adverse consequences including a respiratorry arrest and death she understands it. Family will be informed.  DVt proph: hep SQ  Code Status: Full  Family Communication:  Dr.Singh d/w daughter Thayer Headings over the phone - Informed about noncompliance with C Pap and refusal to participate and stress test, informed her this can result in death or disability. She will arrange for someone to talk to the patient  Disposition Plan:  Transfer out of SDU to tele   Procedures  Echo, Lexiscan -stress part pending  Consults   Cardiology   Medications  Scheduled Meds: . acitretin  10 mg Oral Q supper  . amLODipine  10 mg Oral Daily  . aspirin EC  81 mg Oral Daily  . atorvastatin  20 mg Oral QHS  . bisacodyl  10 mg Rectal BID  . calcium-vitamin D  1 tablet Oral Q breakfast  . Chlorhexidine Gluconate Cloth  6 each  Topical Q0600  . cloNIDine  0.1 mg Transdermal Weekly  . docusate sodium  200 mg Oral BID  . feeding supplement (GLUCERNA SHAKE)  237 mL Oral QPC supper  . folic acid  1 mg Oral Daily  . gabapentin  200 mg Oral BID  . heparin  5,000 Units Subcutaneous 3 times per day  . hydrALAZINE  100 mg Oral TID  . insulin aspart  0-15 Units Subcutaneous TID WC  . insulin glargine  22 Units Subcutaneous BID  . levofloxacin (LEVAQUIN) IV  750 mg Intravenous Q24H  . methotrexate  7.5 mg Oral Q Wed  . montelukast  10 mg Oral Daily  . multivitamin with minerals  1 tablet Oral Daily  . mupirocin ointment  1 application Nasal BID  . pantoprazole  40 mg Oral Daily  . polyethylene glycol  17 g Oral BID  . ranolazine  500 mg Oral BID  . senna  1 tablet Oral BID  . sodium chloride  3 mL Intravenous Q12H  . thiamine  100 mg Oral Daily   Continuous Infusions:  PRN Meds:.acetaminophen **OR** [DISCONTINUED] acetaminophen, HYDROcodone-acetaminophen, methocarbamol, morphine injection, nitroGLYCERIN, [DISCONTINUED] ondansetron **OR** ondansetron (ZOFRAN) IV  DVT Prophylaxis    Heparin    Lab Results  Component Value Date   PLT PENDING 07/12/2014    Antibiotics     Anti-infectives    Start     Dose/Rate Route Frequency Ordered Stop   07/10/14 1500  levofloxacin (LEVAQUIN) IVPB 750 mg     750 mg100 mL/hr over 90 Minutes Intravenous Every 24 hours 07/10/14 1330     07/09/14 0900  levofloxacin (LEVAQUIN) IVPB 750 mg  Status:  Discontinued     750 mg100 mL/hr over 90 Minutes Intravenous Every 48 hours 07/09/14 0814 07/10/14 1330          Objective:   Filed Vitals:   07/12/14 0300 07/12/14 0335 07/12/14 0455 07/12/14 0600  BP: 149/39 149/39 172/48 139/50  Pulse: 92 96 88 90  Temp:   97.2 F (36.2 C)   TempSrc:   Oral   Resp: 21 21 26 28   Height:      Weight:  95.7 kg (210 lb 15.7 oz)    SpO2: 92% 98% 98% 98%    Wt Readings from Last 3 Encounters:  07/12/14 95.7 kg (210 lb 15.7 oz)  05/26/14  99.338 kg (219 lb)  02/07/14 96.616 kg (213 lb)     Intake/Output Summary (Last 24 hours) at 07/12/14 0740 Last data filed at 07/12/14 0620  Gross per 24 hour  Intake      0 ml  Output   1200 ml  Net  -1200 ml     Physical Exam  Awake Alert, Oriented X 2, No new F.N deficits, Normal affect [place].AT,PERRAL Supple Neck,No JVD, No cervical lymphadenopathy appriciated.  Symmetrical Chest wall movement, Good air movement bilaterally, bibasilar rales L>R RRR,No Gallops,Rubs or new Murmurs, No Parasternal Heave +ve B.Sounds, Abd Soft, distended, No tenderness, No organomegaly appriciated, No rebound - guarding or rigidity. No Cyanosis, Clubbing or edema, No new Rash or  bruise      Data Review   Micro Results Recent Results (from the past 240 hour(s))  MRSA PCR Screening     Status: Abnormal   Collection Time: 07/08/14  9:55 PM  Result Value Ref Range Status   MRSA by PCR POSITIVE (A) NEGATIVE Final    Comment:        The GeneXpert MRSA Assay (FDA approved for NASAL specimens only), is one component of a comprehensive MRSA colonization surveillance program. It is not intended to diagnose MRSA infection nor to guide or monitor treatment for MRSA infections. RESULT CALLED TO, READ BACK BY AND VERIFIED WITH: M.EVANGALISTA,RN AT 0119 AT 07/09/18 BY L.PITT     Radiology Reports Ct Head Wo Contrast  07/11/2014   CLINICAL DATA:  Confusion and headache; nausea and vomit  EXAM: CT HEAD WITHOUT CONTRAST  TECHNIQUE: Contiguous axial images were obtained from the base of the skull through the vertex without intravenous contrast.  COMPARISON:  July 08, 2014  FINDINGS: Age related volume loss is stable. There is no mass, hemorrhage, extra-axial fluid collection, or midline shift. Mild small vessel disease in the centra semiovale bilaterally is stable. There is no new gray-white compartment lesion. No acute infarct present. Basal ganglia calcification is felt to be physiologic in this  age group. There is frontal hyperostosis bilaterally. The bony calvarium appears intact. The mastoid air cells are clear.  IMPRESSION: Age related volume loss with mild supratentorial small vessel disease. No intracranial mass, hemorrhage, or acute appearing infarct.   Electronically Signed   By: Lowella Grip M.D.   On: 07/11/2014 09:48   Nm Myocar Single W/spect W/wall Motion And Ef  07/11/2014   CLINICAL DATA:  Patient is an 80 yo with CP Test to evaluate for ischemia  EXAM: MYOCARDIAL IMAGING WITH SPECT (REST)  TECHNIQUE: Standard myocardial SPECT imaging was performed after resting intravenous injection of Tc-84m sestamibi. Quantitative gated imaging was also performed to evaluate left ventricular wall motion, and estimate left ventricular ejection fraction.  COMPARISON:  None.  FINDINGS: Nuclear data; Rest images only. There is thinning in the inferolateral wall (base, mid). Anterolateral wall (base) Otherwise normal perfusion.  IMPRESSION: Rest myoview. Thinning in the basal anterolateral wall and inferolateral wall (base, mid). Otherwise normal perfusion. Images not gated   Electronically Signed   By: Dorris Carnes M.D.   On: 07/11/2014 13:14   Dg Chest Port 1 View  07/11/2014   CLINICAL DATA:  Shortness of breath with low-grade fever and headache.  EXAM: PORTABLE CHEST - 1 VIEW  COMPARISON:  Chest x-ray from an acute abdominal series of July 09, 2014, and a portable chest x-ray of July 08, 2014  FINDINGS: The lungs are adequately inflated. The interstitial markings are more prominent bilaterally. The cardiopericardial silhouette remains enlarged. Its margins are less distinct today. The pulmonary vascularity is engorged and indistinct. There is no pleural effusion. The bony thorax is unremarkable.  IMPRESSION: Congestive heart failure with pulmonary interstitial edema which has worsened since the study of 2 days ago.   Electronically Signed   By: David  Martinique   On: 07/11/2014 10:51   Dg  Chest Port 1 View  07/08/2014   CLINICAL DATA:  Congestive heart failure.  EXAM: PORTABLE CHEST - 1 VIEW  COMPARISON:  07/08/2014 earlier and 08/12/2013  FINDINGS: Lungs are hypoinflated and demonstrate slight worsening bilateral perihilar opacification/interstitial prominence. No definite effusion. Stable cardiomegaly. Remainder the exam is unchanged.  IMPRESSION: Slight worsening bilateral perihilar opacification/interstitial prominence which  may be due to mild interstitial edema versus interstitial pneumonitis.  Stable cardiomegaly.   Electronically Signed   By: Marin Olp M.D.   On: 07/08/2014 23:42   Dg Abd Acute W/chest  07/09/2014   CLINICAL DATA:  Difficulty breathing ; abdominal pain  EXAM: ACUTE ABDOMEN SERIES (ABDOMEN 2 VIEW & CHEST 1 VIEW)  COMPARISON:  Chest radiograph July 08, 2014 and July 09, 2013 ; CT abdomen and pelvis July 08, 2014  FINDINGS: PA chest: There is scarring in the left base. There is stable generalized interstitial prominence, probably reflecting chronic interstitial fibrosis. No new opacity seen. Heart is mildly enlarged with pulmonary vascularity within normal limits. No adenopathy.  Supine and left lateral decubitus abdomen: There is diffuse stool throughout the colon. There is no bowel dilatation or air-fluid level to suggest obstruction. No free air. There are multiple phleboliths in the pelvis. Calcifications are noted to the right of L4 and L5. Suspect small calcified mesenteric lymph nodes are possible phleboliths.  IMPRESSION: Diffuse stool in colon. Bowel gas pattern unremarkable. Probable chronic interstitial fibrotic type change. No frank edema or consolidation. Stable mild cardiac enlargement.   Electronically Signed   By: Lowella Grip M.D.   On: 07/09/2014 13:21     CBC  Recent Labs Lab 07/09/14 0026 07/09/14 0415 07/10/14 0316 07/12/14 0357  WBC 12.4* 13.0* 13.2* PENDING  HGB 10.8* 10.7* 10.3* 10.1*  HCT 34.5* 34.4* 33.4* 32.4*   PLT PLATELET CLUMPS NOTED ON SMEAR, UNABLE TO ESTIMATE 212 166 PENDING  MCV 81.0 80.2 80.7 81.6  MCH 25.4* 24.9* 24.9* 25.4*  MCHC 31.3 31.1 30.8 31.2  RDW 19.5* 19.7* 19.9* 20.0*    Chemistries   Recent Labs Lab 07/09/14 0026 07/09/14 0415 07/10/14 0316 07/12/14 0357  NA 137 141 139 139  K 4.3 4.0 4.2 3.8  CL 101 103 101 101  CO2 20 24 24 24   GLUCOSE 189* 204* 176* 113*  BUN 36* 34* 22 11  CREATININE 1.09 0.99 0.87 0.76  CALCIUM 10.6* 10.8* 10.8* 10.8*  AST 15 15  --  18  ALT 24 24  --  18  ALKPHOS 69 66  --  59  BILITOT 0.5 0.6  --  0.7   ------------------------------------------------------------------------------------------------------------------ estimated creatinine clearance is 59.8 mL/min (by C-G formula based on Cr of 0.76). ------------------------------------------------------------------------------------------------------------------  Recent Labs  07/09/14 1048  HGBA1C 8.2*   ------------------------------------------------------------------------------------------------------------------ No results for input(s): CHOL, HDL, LDLCALC, TRIG, CHOLHDL, LDLDIRECT in the last 72 hours. ------------------------------------------------------------------------------------------------------------------ No results for input(s): TSH, T4TOTAL, T3FREE, THYROIDAB in the last 72 hours.  Invalid input(s): FREET3 ------------------------------------------------------------------------------------------------------------------ No results for input(s): VITAMINB12, FOLATE, FERRITIN, TIBC, IRON, RETICCTPCT in the last 72 hours.  Coagulation profile  Recent Labs Lab 07/09/14 0415  INR 1.11    No results for input(s): DDIMER in the last 72 hours.  Cardiac Enzymes  Recent Labs Lab 07/09/14 0026 07/09/14 0415 07/09/14 1048  TROPONINI <0.30 <0.30 <0.30    ------------------------------------------------------------------------------------------------------------------ Invalid input(s): POCBNP     Time Spent in minutes   25   Theresa Mclean M.D on 07/12/2014 at 7:40 AM  Between 7am to 7pm - Pager - [phone]  After 7pm go to [email or web address] - password [email or web address]  And look for the night coverage person covering for me after hours  [facility] Office  [phone]

## 2014-07-12 NOTE — Progress Notes (Signed)
SLP Cancellation Note  Patient Details Name: Theresa Mclean MRN: [id number] DOB: 1929-02-05   Cancelled treatment:        Unable to assess diet tolerance at this time, as pt is NPO for stress test. Will continue efforts to monitor diet tolerance once po trials restarted. RN aware.  Celia B. [place], [facility], [place]  Shonna Chock 07/12/2014, 8:57 AM

## 2014-07-12 NOTE — Plan of Care (Signed)
Problem: Phase I Progression Outcomes Goal: Voiding-avoid urinary catheter unless indicated Outcome: Progressing     

## 2014-07-12 NOTE — Plan of Care (Signed)
Problem: Phase II Progression Outcomes Goal: Pain controlled Outcome: Completed/Met Date Met:  07/12/14

## 2014-07-12 NOTE — Progress Notes (Signed)
ANTIBIOTIC CONSULT NOTE - INITIAL  Pharmacy Consult for Levaquin Indication: pneumonia  No Known Allergies  Patient Measurements: Height: 5\' 4"  (162.6 cm) Weight: 210 lb 15.7 oz (95.7 kg) IBW/kg (Calculated) : 54.7  Vital Signs: Temp: 98.4 F (36.9 C) (11/14 0808) Temp Source: Oral (11/14 0808) BP: 167/49 mmHg (11/14 1016) Pulse Rate: 89 (11/14 0808) Intake/Output from previous day: 11/13 0701 - 11/14 0700 In: -  Out: 1200 [Urine:1200] Intake/Output from this shift: Total I/O In: -  Out: 450 [Urine:450]  Labs:  Recent Labs  07/10/14 0316 07/12/14 0357  WBC 13.2* 8.2  HGB 10.3* 10.1*  PLT 166 193  CREATININE 0.87 0.76   Estimated Creatinine Clearance: 59.8 mL/min (by C-G formula based on Cr of 0.76). No results for input(s): VANCOTROUGH, VANCOPEAK, VANCORANDOM, GENTTROUGH, GENTPEAK, GENTRANDOM, TOBRATROUGH, TOBRAPEAK, TOBRARND, AMIKACINPEAK, AMIKACINTROU, AMIKACIN in the last 72 hours.   Microbiology: Recent Results (from the past 720 hour(s))  MRSA PCR Screening     Status: Abnormal   Collection Time: 07/08/14  9:55 PM  Result Value Ref Range Status   MRSA by PCR POSITIVE (A) NEGATIVE Final    Comment:        The GeneXpert MRSA Assay (FDA approved for NASAL specimens only), is one component of a comprehensive MRSA colonization surveillance program. It is not intended to diagnose MRSA infection nor to guide or monitor treatment for MRSA infections. RESULT CALLED TO, READ BACK BY AND VERIFIED WITH: M.EVANGALISTA,RN AT 0119 AT 07/09/18 BY L.PITT     Medical History: Past Medical History  Diagnosis Date  . Hypertension   . Nephrolithiasis   . GERD (gastroesophageal reflux disease)   . Arthritis   . Psoriasis   . Morbid obesity   . Mitral regurgitation   . Hyperlipidemia   . Sleep apnea     uses cpap  . CHF (congestive heart failure)     Diastolic. EF 55-65% by cath 10/11/11  . Diabetes mellitus     insulin dependent  . Acute renal  insufficiency     09/2011. not on ACEI secondary to this (also has renal artery stenosis)  . Peripheral vascular disease     severe right renal artery stenosis and left SFA stenosis by PV angio 09/2011, treated medically  . Cellulitis   . CAD (coronary artery disease)     NSTEMI 09/2011 felt poor candidate for CABG, instead s/p PTCA/DES to mid RCA 10/17/11  . Chronic respiratory failure   . Pulmonary hypertension   . Obstructive sleep apnea   . CKD (chronic kidney disease) stage 2, GFR 60-89 ml/min 07/11/2013    Medications: see electronic med rec  Assessment: 80 yo female transferred from [facility] on 07/08/2014.  Bradycardic and hyperkalemic which has now resolved. Patient is on day #4 LVQ empirically for CAP. CXR on 11/11 with chronic interstitial changes. Patient remains afebrile, WBC are trending down to 8.2, SCr remains stable at 0.76 (CrCl ~60 ml/min).   Plan:  - Change to PO Levaquin 750 mg daily - Monitor renal function, temp, WBC, LOT  [name], PharmD Clinical Pharmacist - Resident Pager: [phone] Pharmacy: [phone] 07/12/2014 10:54 AM

## 2014-07-12 NOTE — Progress Notes (Signed)
Nurse contacted Cardiology at the request of hospitalist to inform that previous order for stress test has been cancelled per nuclear med.  MD will write another order for stress test, instructed nurse that pt can eat her diet until midnight in for stress test that will be scheduled for tomorrow, instructed nurse to hold on transfer of pt to telemetry until team has rounded on pt.  Nurse will carry out orders as instructed and will continue to monitor pt.

## 2014-07-12 NOTE — Progress Notes (Addendum)
SUBJECTIVE:  80 yo female , transferred from [facility].  Hx of diastolic dysfunction, hx of CAD, had DES.  Had some CP earlier in the week but has not had any recently.  Denies chest pain or SOB at present.   BP 154/46 mmHg  Pulse 91  Temp(Src) 97.1 F (36.2 C) (Oral)  Resp 21  Ht 5\' 4"  (1.626 m)  Wt 210 lb 15.7 oz (95.7 kg)  BMI 36.20 kg/m2  SpO2 98%  LMP 08/29/1990  Intake/Output Summary (Last 24 hours) at 07/12/14 1247 Last data filed at 07/12/14 1119  Gross per 24 hour  Intake      0 ml  Output   1500 ml  Net  -1500 ml    PHYSICAL EXAM General: obese female.  Psych:  Good affect, responds appropriately Neck: No JVD. No masses noted.  Lungs: Clear bilaterally with no wheezes or rhonci noted.  Heart: RRR with no murmurs noted. Abdomen: Bowel sounds are present. Soft, non-tender.  Extremities: No lower extremity edema.   LABS: Basic Metabolic Panel:  Recent Labs  07/10/14 0316 07/12/14 0357  NA 139 139  K 4.2 3.8  CL 101 101  CO2 24 24  GLUCOSE 176* 113*  BUN 22 11  CREATININE 0.87 0.76  CALCIUM 10.8* 10.8*   CBC:  Recent Labs  07/10/14 0316 07/12/14 0357  WBC 13.2* 8.2  HGB 10.3* 10.1*  HCT 33.4* 32.4*  MCV 80.7 81.6  PLT 166 193   Cardiac Enzymes: No results for input(s): CKTOTAL, CKMB, CKMBINDEX, TROPONINI in the last 72 hours. Current Meds: . acitretin  10 mg Oral Q supper  . amLODipine  10 mg Oral Daily  . aspirin EC  81 mg Oral Daily  . atorvastatin  20 mg Oral QHS  . bisacodyl  10 mg Rectal BID  . calcium-vitamin D  1 tablet Oral Q breakfast  . Chlorhexidine Gluconate Cloth  6 each Topical Q0600  . cloNIDine  0.1 mg Transdermal Weekly  . docusate sodium  200 mg Oral BID  . feeding supplement (GLUCERNA SHAKE)  237 mL Oral QPC supper  . folic acid  1 mg Oral Daily  . furosemide  40 mg Oral Daily  . gabapentin  200 mg Oral BID  . heparin  5,000 Units Subcutaneous 3 times per day  . hydrALAZINE  100 mg Oral TID  .  insulin aspart  0-15 Units Subcutaneous TID WC  . insulin glargine  22 Units Subcutaneous BID  . [START ON 07/13/2014] levofloxacin  750 mg Oral Daily  . methotrexate  7.5 mg Oral Q Wed  . montelukast  10 mg Oral Daily  . multivitamin with minerals  1 tablet Oral Daily  . mupirocin ointment  1 application Nasal BID  . pantoprazole  40 mg Oral Daily  . polyethylene glycol  17 g Oral BID  . ranolazine  500 mg Oral BID  . senna  1 tablet Oral BID  . sodium chloride  3 mL Intravenous Q12H  . spironolactone  25 mg Oral Daily  . thiamine  100 mg Oral Daily   Echo 07/09/14: Left ventricle: The cavity size was normal. Wall thickness was increased in a pattern of moderate LVH. Systolic function was normal. The estimated ejection fraction was in the range of 60% to 65%. Wall motion was normal; there were no regional wall motion abnormalities. Doppler parameters are consistent with abnormal left ventricular relaxation (grade 1 diastolic dysfunction). - Aortic valve: Trileaflet; mildly calcified  leaflets. There was mild stenosis. Mean gradient (S): 12 mm Hg. - Mitral valve: There was trivial regurgitation. - Left atrium: The atrium was mildly to moderately dilated. - Right ventricle: The cavity size was normal. Systolic function was normal. - Tricuspid valve: Peak RV-RA gradient (S): 24 mm Hg. - Pulmonary arteries: PA peak pressure: 39 mm Hg (S). - Systemic veins: IVC measured 2.6 cm with < 50% respirophasic variation, suggesting RA pressure 15 mmHg. Impressions: - Normal LV size with moderate LV hypertrophy. EF 60-65%. Normal RV size and systolic function. Mild aortic stenosis. Mild pulmonary hypertension.   ASSESSMENT AND PLAN:  1. CAD/Chest pain: Chest pain has not recurred last 24 hours.  Troponin levels are all negative.  She is known to have CAD with last cath in February 2013 at which time her RCA was treated with a single DES. She was also noted to have  severe disease in the distal Circumflex that was not amenable to PCI. The LAD has mild disease. Nuclear myocardial perfusion study stress was normal in November of 2014. Now with new inverted T waves on EKG. Echo 07/09/14 with normal LV function, LVH, mild AS. Stress test had been planned on 07/10/14 but pt with acute confusion so stress test was cancelled. Her confusion has resolved but she does not wish to undergo a stress test. Since her echo shows normal LV function and her chest pain has resolved and no evidence of ACS, will cancel stress testing. Continue ASA, atorvastatin and ranolazine. Atenolol was held secondary to bradycardia.    continue medical management of CAD. If she has recurrent chest pain, will have to consider stress testing  as an outpatient.   2. Acute on chronic diastolic heart failure: Volume status appears to be stable. Continue home dose of lasix 40 mg PO daily and spironolactone 25 mg po daily.  3. Bradycardia: Resolved off of atenolol and with correction of potasssium. Would not restart atenolol.     OK to transfer to tele.  She should be able to go home soon.   Final plans per Int. Med.  Will sign off. Call for question.   Thayer Headings, Brooke Bonito., MD, [facility] 07/12/2014, 12:51 PM [address]  [place] Pager [phone]

## 2014-07-12 NOTE — Plan of Care (Signed)
Problem: Phase I Progression Outcomes Goal: Dyspnea controlled at rest (HF) Outcome: Progressing

## 2014-07-13 DIAGNOSIS — G4733 Obstructive sleep apnea (adult) (pediatric): Secondary | ICD-10-CM

## 2014-07-13 LAB — GLUCOSE, CAPILLARY
Glucose-Capillary: 133 mg/dL — ABNORMAL HIGH (ref 70–99)
Glucose-Capillary: 231 mg/dL — ABNORMAL HIGH (ref 70–99)
Glucose-Capillary: 154 mg/dL — ABNORMAL HIGH (ref 70–99)
Glucose-Capillary: 229 mg/dL — ABNORMAL HIGH (ref 70–99)

## 2014-07-13 LAB — BASIC METABOLIC PANEL
Anion gap: 14 (ref 5–15)
BUN: 11 mg/dL (ref 6–23)
CO2: 25 mEq/L (ref 19–32)
Calcium: 11.2 mg/dL — ABNORMAL HIGH (ref 8.4–10.5)
Chloride: 97 mEq/L (ref 96–112)
Creatinine, Ser: 0.88 mg/dL (ref 0.50–1.10)
GFR, EST AFRICAN AMERICAN: 69 mL/min — AB (ref >90)
GFR, EST NON AFRICAN AMERICAN: 59 mL/min — AB (ref >90)
Glucose, Bld: 122 mg/dL — ABNORMAL HIGH (ref 70–99)
Potassium: 4.2 mEq/L (ref 3.7–5.3)
SODIUM: 136 meq/L — AB (ref 137–147)

## 2014-07-13 NOTE — Progress Notes (Signed)
Patient Demographics  Theresa Mclean, is a 80 y.o. female, DOB - [birth year], FBP:[id number]  Admit date - 07/08/2014   Admitting Physician Ripudeep Krystal Eaton, MD  Outpatient Primary MD for the patient is [name],[name], MD  LOS - 5   No chief complaint on file.     Brief summary  80 year old African-American female who was transferred from [facility] where she presented with abdominal pain and nausea, she was found to have hyperkalemia along with bradycardia, she was sent to [facility] for admission. Here workup revealed large stool burden on abdominal x-ray, EKG changes suggestive of T-wave inversion, cardiology was consulted. Bowel regimen was initiated. There was also suggestion of possible URI versus early pneumonia.  Subjective:   Theresa Mclean refused her stress test again   Assessment & Plan    Abdominal pain nausea vomiting.  -resolved, Likely gastroenteritis, however x-ray shows evidence of stool burden, -bowel regimen initiated, given SMOG enema with good results -continue dulcolax, senna and miralax  Bradycardia and hyperkalemia upon arrival to [facility]. - Currently in sinus rhythm, beta blocker discontinued, hyperkalemia corrected, monitor on telemetry,  - ECHo with normal EF and no wall motion abnormalities - TSH is stable, cardiology following, rate stable.  CAD - T-wave inversion in lateral leads on EKG. Chest pain-free. Troponin negative , beta blocker held due to bradycardia, echo without any wall motion abnormality and a preserved EF of 60%, cardiology following -Continue aspirin, statin, Ranexa, PRN sublingual nitroglycerin. - Cardiology has ordered Lexiscan stress test on 07/10/2014, which patient was refusing, underwent Resting scan yesterday, and has refused to finish  again today  DM type II. Continue Lantus and sliding scale, hgbAic 8.2  CBG (last 3)   Recent Labs  07/12/14 1200 07/12/14 1713 07/12/14 2059  GLUCAP 172* 176* 202*    Essential hypertension - Continue Norvasc, increased hydralazine and Catapres added by cardiology for better control. Beta blocker held due to bradycardia.     Possible CAP . Afebrile, mild leukocytosis, placed on Levaquin, speech eval and monitor. -change to PO  Chr diastolic congestive heart failure Ef 60% - compensated this admission.  -resume lasix and aldactone   GERD - PPI  Diabetic neuropathy. Continue Neurontin dose reduced due to somnolence.  Indwelling Foley catheter. Placed recently at [facility]. Was removed 11/12, able to void   OSA. Daily at bedtime CPAP. Non-compliant with C Pap at time. Patient counseled in warned about adverse consequences including a respiratorry arrest and death she understands it. Family will be informed.  DVt proph: hep SQ  Code Status: Full  Family Communication:  Son-  Disposition Plan:  Home soon   Procedures  Echo, patient refused [place]   Cardiology   Medications  Scheduled Meds: . acitretin  10 mg Oral Q supper  . amLODipine  10 mg Oral Daily  . aspirin EC  81 mg Oral Daily  . atorvastatin  20 mg Oral QHS  . bisacodyl  10 mg Rectal BID  . calcium-vitamin D  1 tablet Oral Q breakfast  . cloNIDine  0.1 mg Transdermal Weekly  . docusate sodium  200 mg Oral BID  . feeding supplement (GLUCERNA SHAKE)  237 mL Oral QPC supper  . folic acid  1 mg Oral Daily  . furosemide  40 mg Oral Daily  . gabapentin  200 mg Oral BID  . heparin  5,000 Units Subcutaneous 3 times per day  . hydrALAZINE  100 mg Oral TID  . insulin aspart  0-15 Units Subcutaneous TID WC  . insulin glargine  22 Units Subcutaneous BID  . levofloxacin  750 mg Oral Daily  . methotrexate  7.5 mg Oral Q Wed  . montelukast  10 mg Oral Daily  . multivitamin with minerals  1 tablet  Oral Daily  . mupirocin ointment  1 application Nasal BID  . pantoprazole  40 mg Oral Daily  . polyethylene glycol  17 g Oral BID  . ranolazine  500 mg Oral BID  . senna  1 tablet Oral BID  . sodium chloride  3 mL Intravenous Q12H  . spironolactone  25 mg Oral Daily  . thiamine  100 mg Oral Daily   Continuous Infusions:  PRN Meds:.acetaminophen **OR** [DISCONTINUED] acetaminophen, HYDROcodone-acetaminophen, methocarbamol, morphine injection, nitroGLYCERIN, [DISCONTINUED] ondansetron **OR** ondansetron (ZOFRAN) IV  DVT Prophylaxis    Heparin    Lab Results  Component Value Date   PLT 193 07/12/2014    Antibiotics     Anti-infectives    Start     Dose/Rate Route Frequency Ordered Stop   07/13/14 1000  levofloxacin (LEVAQUIN) tablet 750 mg     750 mg Oral Daily 07/12/14 1055     07/12/14 1000  levofloxacin (LEVAQUIN) tablet 500 mg  Status:  Discontinued     500 mg Oral Daily 07/12/14 0745 07/12/14 1055   07/10/14 1500  levofloxacin (LEVAQUIN) IVPB 750 mg  Status:  Discontinued     750 mg100 mL/hr over 90 Minutes Intravenous Every 24 hours 07/10/14 1330 07/12/14 0745   07/09/14 0900  levofloxacin (LEVAQUIN) IVPB 750 mg  Status:  Discontinued     750 mg100 mL/hr over 90 Minutes Intravenous Every 48 hours 07/09/14 0814 07/10/14 1330          Objective:   Filed Vitals:   07/12/14 1455 07/12/14 1501 07/12/14 2057 07/13/14 0549  BP: 129/70  165/53 153/51  Pulse: 84  100 91  Temp: 98.3 F (36.8 C)  98.3 F (36.8 C) 99.1 F (37.3 C)  TempSrc: Oral  Oral Oral  Resp: 22  20 20   Height:      Weight:   94.756 kg (208 lb 14.4 oz)   SpO2: 88% 98% 98% 96%    Wt Readings from Last 3 Encounters:  07/12/14 94.756 kg (208 lb 14.4 oz)  05/26/14 99.338 kg (219 lb)  02/07/14 96.616 kg (213 lb)     Intake/Output Summary (Last 24 hours) at 07/13/14 0809 Last data filed at 07/13/14 0600  Gross per 24 hour  Intake     80 ml  Output    700 ml  Net   -620 ml     Physical  Exam  Awake Alert, Oriented X 2, No new F.N deficits, Normal affect Symmetrical Chest wall movement, Good air movement bilaterally, bibasilar rales L>R RRR,No Gallops,Rubs or new Murmurs, No Parasternal Heave +ve B.Sounds, Abd Soft, distended, No tenderness, No organomegaly appriciated, No rebound - guarding or rigidity. No Cyanosis, Clubbing or edema, No new Rash or bruise      Data Review   Micro Results Recent Results (from the past 240 hour(s))  MRSA PCR Screening     Status: Abnormal   Collection Time: 07/08/14  9:55 PM  Result Value Ref  Range Status   MRSA by PCR POSITIVE (A) NEGATIVE Final    Comment:        The GeneXpert MRSA Assay (FDA approved for NASAL specimens only), is one component of a comprehensive MRSA colonization surveillance program. It is not intended to diagnose MRSA infection nor to guide or monitor treatment for MRSA infections. RESULT CALLED TO, READ BACK BY AND VERIFIED WITH: M.EVANGALISTA,RN AT 0119 AT 07/09/18 BY L.PITT     Radiology Reports Ct Head Wo Contrast  07/11/2014   CLINICAL DATA:  Confusion and headache; nausea and vomit  EXAM: CT HEAD WITHOUT CONTRAST  TECHNIQUE: Contiguous axial images were obtained from the base of the skull through the vertex without intravenous contrast.  COMPARISON:  July 08, 2014  FINDINGS: Age related volume loss is stable. There is no mass, hemorrhage, extra-axial fluid collection, or midline shift. Mild small vessel disease in the centra semiovale bilaterally is stable. There is no new gray-white compartment lesion. No acute infarct present. Basal ganglia calcification is felt to be physiologic in this age group. There is frontal hyperostosis bilaterally. The bony calvarium appears intact. The mastoid air cells are clear.  IMPRESSION: Age related volume loss with mild supratentorial small vessel disease. No intracranial mass, hemorrhage, or acute appearing infarct.   Electronically Signed   By: Lowella Grip  M.D.   On: 07/11/2014 09:48   Nm Myocar Single W/spect W/wall Motion And Ef  07/11/2014   CLINICAL DATA:  Patient is an 80 yo with CP Test to evaluate for ischemia  EXAM: MYOCARDIAL IMAGING WITH SPECT (REST)  TECHNIQUE: Standard myocardial SPECT imaging was performed after resting intravenous injection of Tc-24m sestamibi. Quantitative gated imaging was also performed to evaluate left ventricular wall motion, and estimate left ventricular ejection fraction.  COMPARISON:  None.  FINDINGS: Nuclear data; Rest images only. There is thinning in the inferolateral wall (base, mid). Anterolateral wall (base) Otherwise normal perfusion.  IMPRESSION: Rest myoview. Thinning in the basal anterolateral wall and inferolateral wall (base, mid). Otherwise normal perfusion. Images not gated   Electronically Signed   By: Dorris Carnes M.D.   On: 07/11/2014 13:14   Dg Chest Port 1 View  07/11/2014   CLINICAL DATA:  Shortness of breath with low-grade fever and headache.  EXAM: PORTABLE CHEST - 1 VIEW  COMPARISON:  Chest x-ray from an acute abdominal series of July 09, 2014, and a portable chest x-ray of July 08, 2014  FINDINGS: The lungs are adequately inflated. The interstitial markings are more prominent bilaterally. The cardiopericardial silhouette remains enlarged. Its margins are less distinct today. The pulmonary vascularity is engorged and indistinct. There is no pleural effusion. The bony thorax is unremarkable.  IMPRESSION: Congestive heart failure with pulmonary interstitial edema which has worsened since the study of 2 days ago.   Electronically Signed   By: David  Martinique   On: 07/11/2014 10:51   Dg Chest Port 1 View  07/08/2014   CLINICAL DATA:  Congestive heart failure.  EXAM: PORTABLE CHEST - 1 VIEW  COMPARISON:  07/08/2014 earlier and 08/12/2013  FINDINGS: Lungs are hypoinflated and demonstrate slight worsening bilateral perihilar opacification/interstitial prominence. No definite effusion. Stable  cardiomegaly. Remainder the exam is unchanged.  IMPRESSION: Slight worsening bilateral perihilar opacification/interstitial prominence which may be due to mild interstitial edema versus interstitial pneumonitis.  Stable cardiomegaly.   Electronically Signed   By: Marin Olp M.D.   On: 07/08/2014 23:42   Dg Abd Acute W/chest  07/09/2014   CLINICAL DATA:  Difficulty breathing ; abdominal pain  EXAM: ACUTE ABDOMEN SERIES (ABDOMEN 2 VIEW & CHEST 1 VIEW)  COMPARISON:  Chest radiograph July 08, 2014 and July 09, 2013 ; CT abdomen and pelvis July 08, 2014  FINDINGS: PA chest: There is scarring in the left base. There is stable generalized interstitial prominence, probably reflecting chronic interstitial fibrosis. No new opacity seen. Heart is mildly enlarged with pulmonary vascularity within normal limits. No adenopathy.  Supine and left lateral decubitus abdomen: There is diffuse stool throughout the colon. There is no bowel dilatation or air-fluid level to suggest obstruction. No free air. There are multiple phleboliths in the pelvis. Calcifications are noted to the right of L4 and L5. Suspect small calcified mesenteric lymph nodes are possible phleboliths.  IMPRESSION: Diffuse stool in colon. Bowel gas pattern unremarkable. Probable chronic interstitial fibrotic type change. No frank edema or consolidation. Stable mild cardiac enlargement.   Electronically Signed   By: Lowella Grip M.D.   On: 07/09/2014 13:21     CBC  Recent Labs Lab 07/09/14 0026 07/09/14 0415 07/10/14 0316 07/12/14 0357  WBC 12.4* 13.0* 13.2* 8.2  HGB 10.8* 10.7* 10.3* 10.1*  HCT 34.5* 34.4* 33.4* 32.4*  PLT PLATELET CLUMPS NOTED ON SMEAR, UNABLE TO ESTIMATE 212 166 193  MCV 81.0 80.2 80.7 81.6  MCH 25.4* 24.9* 24.9* 25.4*  MCHC 31.3 31.1 30.8 31.2  RDW 19.5* 19.7* 19.9* 20.0*    Chemistries   Recent Labs Lab 07/09/14 0026 07/09/14 0415 07/10/14 0316 07/12/14 0357 07/13/14 0524  NA 137 141 139 139  136*  K 4.3 4.0 4.2 3.8 4.2  CL 101 103 101 101 97  CO2 20 24 24 24 25   GLUCOSE 189* 204* 176* 113* 122*  BUN 36* 34* 22 11 11   CREATININE 1.09 0.99 0.87 0.76 0.88  CALCIUM 10.6* 10.8* 10.8* 10.8* 11.2*  AST 15 15  --  18  --   ALT 24 24  --  18  --   ALKPHOS 69 66  --  59  --   BILITOT 0.5 0.6  --  0.7  --    ------------------------------------------------------------------------------------------------------------------ estimated creatinine clearance is 54.1 mL/min (by C-G formula based on Cr of 0.88). ------------------------------------------------------------------------------------------------------------------ No results for input(s): HGBA1C in the last 72 hours. ------------------------------------------------------------------------------------------------------------------ No results for input(s): CHOL, HDL, LDLCALC, TRIG, CHOLHDL, LDLDIRECT in the last 72 hours. ------------------------------------------------------------------------------------------------------------------ No results for input(s): TSH, T4TOTAL, T3FREE, THYROIDAB in the last 72 hours.  Invalid input(s): FREET3 ------------------------------------------------------------------------------------------------------------------ No results for input(s): VITAMINB12, FOLATE, FERRITIN, TIBC, IRON, RETICCTPCT in the last 72 hours.  Coagulation profile  Recent Labs Lab 07/09/14 0415  INR 1.11    No results for input(s): DDIMER in the last 72 hours.  Cardiac Enzymes  Recent Labs Lab 07/09/14 0026 07/09/14 0415 07/09/14 1048  TROPONINI <0.30 <0.30 <0.30   ------------------------------------------------------------------------------------------------------------------ Invalid input(s): POCBNP     Time Spent in minutes   25   Azure Budnick DO on 07/13/2014 at 8:09 AM  Between 7am to 7pm - Pager - [phone]  After 7pm go to [email or web address] - password [email or web address]  And look for the night coverage  person covering for me after hours  [facility] Office  [phone]

## 2014-07-13 NOTE — Progress Notes (Signed)
Speech Language Pathology Treatment: Dysphagia  Patient Details Name: Theresa Mclean MRN: [id number] DOB: Feb 24, 1929 Today's Date: 07/13/2014 Time: 6269-4854 SLP Time Calculation (min) (ACUTE ONLY): 10 min  Assessment / Plan / Recommendation Clinical Impression  Pt more alert and appropriate today, answering questions and interacting appropriately.  Pt voice quality was noted to be clear. No coughing or throat clearing observed. Pt did appear quite SOB, speaking only 3-4 words per breath. Pt refused po trials at this time, due to increased abdominal pain. Pt had regular foods/thin liquid lunch tray at bedside, untouched. Pt was encouraged to continue safe swallow precautions once ready to begin po intake. ST to continue to follow to assess diet tolerance.   HPI HPI: Theresa H Chipps is an 80 y.o. female with multiple medical problems who presents as a transfer from [facility]. Patient presented to [facility] not feeling well. She was noted to have bradycardia and also was noted to have hyperkalemia. Patient states that she has been having some vague abdominal pain. Pt reports needing her esophagus stretched a few years ago and has recently been having the sensation of foods sticking in her distal esophagus with associated pain. CXR with possible mild interstitial edema versus interstitial pneumonitis.   Pertinent Vitals Pain Assessment: Faces Faces Pain Scale: Hurts little more Pain Location: abdominal pain Pain Descriptors / Indicators: Aching Pain Intervention(s): Repositioned  SLP Plan  Continue with current plan of care    Recommendations Diet recommendations: Regular;Thin liquid Liquids provided via: Cup;No straw Medication Administration: Whole meds with puree Supervision: Patient able to self feed;Full supervision/cueing for compensatory strategies Compensations: Slow rate;Small sips/bites;Follow solids with liquid Postural Changes and/or Swallow Maneuvers: Seated upright 90  degrees;Upright 30-60 min after meal              Oral Care Recommendations: Oral care BID Follow up Recommendations:  (TBD) Plan: Continue with current plan of care    GO   Izzak Fries B. Quentin O[facility], CCC-SLP [phone]  Shonna Chock 07/13/2014, 1:12 PM

## 2014-07-13 NOTE — Progress Notes (Signed)
Patient is refusing to wear CPAP at this time. RT informed patient that if she changed her mind to inform RT.

## 2014-07-14 DIAGNOSIS — E1165 Type 2 diabetes mellitus with hyperglycemia: Secondary | ICD-10-CM

## 2014-07-14 DIAGNOSIS — E1129 Type 2 diabetes mellitus with other diabetic kidney complication: Secondary | ICD-10-CM

## 2014-07-14 LAB — CBC
HEMATOCRIT: 36.2 % (ref 36.0–46.0)
HEMOGLOBIN: 11.2 g/dL — AB (ref 12.0–15.0)
MCH: 25.2 pg — AB (ref 26.0–34.0)
MCHC: 30.9 g/dL (ref 30.0–36.0)
MCV: 81.5 fL (ref 78.0–100.0)
Platelets: 248 10*3/uL (ref 150–400)
RBC: 4.44 MIL/uL (ref 3.87–5.11)
RDW: 19.7 % — ABNORMAL HIGH (ref 11.5–15.5)
WBC: 9.1 10*3/uL (ref 4.0–10.5)

## 2014-07-14 LAB — GLUCOSE, CAPILLARY
Glucose-Capillary: 190 mg/dL — ABNORMAL HIGH (ref 70–99)
Glucose-Capillary: 253 mg/dL — ABNORMAL HIGH (ref 70–99)
Glucose-Capillary: 176 mg/dL — ABNORMAL HIGH (ref 70–99)

## 2014-07-14 LAB — BASIC METABOLIC PANEL
Anion gap: 13 (ref 5–15)
BUN: 15 mg/dL (ref 6–23)
CALCIUM: 11.2 mg/dL — AB (ref 8.4–10.5)
CO2: 27 meq/L (ref 19–32)
Chloride: 96 mEq/L (ref 96–112)
Creatinine, Ser: 0.98 mg/dL (ref 0.50–1.10)
GFR calc Af Amer: 60 mL/min — ABNORMAL LOW (ref >90)
GFR, EST NON AFRICAN AMERICAN: 52 mL/min — AB (ref >90)
GLUCOSE: 192 mg/dL — AB (ref 70–99)
Potassium: 4 mEq/L (ref 3.7–5.3)
Sodium: 136 mEq/L — ABNORMAL LOW (ref 137–147)

## 2014-07-14 MED ORDER — GABAPENTIN 100 MG PO CAPS: 200.0000 mg | ORAL_CAPSULE | Freq: Two times a day (BID) | ORAL | Status: AC

## 2014-07-14 MED ORDER — CLONIDINE HCL 0.1 MG/24HR TD PTWK: 0.1000 mg | MEDICATED_PATCH | TRANSDERMAL | Status: DC

## 2014-07-14 MED ORDER — INSULIN GLARGINE 100 UNIT/ML ~~LOC~~ SOLN: 22.0000 [IU] | Freq: Two times a day (BID) | SUBCUTANEOUS | Status: DC

## 2014-07-14 MED ORDER — HYDRALAZINE HCL 100 MG PO TABS: 100.0000 mg | ORAL_TABLET | Freq: Three times a day (TID) | ORAL | Status: DC

## 2014-07-14 MED ORDER — DSS 100 MG PO CAPS: 200.0000 mg | ORAL_CAPSULE | Freq: Two times a day (BID) | ORAL | Status: DC

## 2014-07-14 MED ORDER — SENNA 8.6 MG PO TABS: 1.0000 | ORAL_TABLET | Freq: Two times a day (BID) | ORAL | Status: DC

## 2014-07-14 MED ORDER — BISACODYL 10 MG RE SUPP: 10.0000 mg | Freq: Two times a day (BID) | RECTAL | Status: DC

## 2014-07-14 MED ORDER — GLUCERNA SHAKE PO LIQD: 237.0000 mL | Freq: Every day | ORAL | Status: DC

## 2014-07-14 MED ORDER — CLONIDINE HCL 0.1 MG PO TABS
0.1000 mg | ORAL_TABLET | Freq: Once | ORAL | Status: AC
  Administered 2014-07-14: 0.1 mg via ORAL
  Filled 2014-07-14: qty 1

## 2014-07-14 MED ORDER — SORBITOL 70 % SOLN
960.0000 mL | TOPICAL_OIL | Freq: Once | ORAL | Status: AC
  Administered 2014-07-14: 960 mL via RECTAL
  Filled 2014-07-14: qty 240

## 2014-07-14 MED ORDER — LINACLOTIDE 290 MCG PO CAPS
290.0000 ug | ORAL_CAPSULE | Freq: Every day | ORAL | Status: DC
  Administered 2014-07-14: 290 ug via ORAL
  Filled 2014-07-14: qty 1

## 2014-07-14 NOTE — Plan of Care (Signed)
Problem: Phase I Progression Outcomes Goal: Up in chair, BRP Outcome: Completed/Met Date Met:  07/14/14 Goal: Voiding-avoid urinary catheter unless indicated Outcome: Completed/Met Date Met:  07/14/14 Goal: Hemodynamically stable Outcome: Completed/Met Date Met:  07/14/14

## 2014-07-14 NOTE — Progress Notes (Signed)
Speech Language Pathology Treatment: Dysphagia  Patient Details Name: Theresa Mclean MRN: [id number] DOB: 1928/10/06 Today's Date: 07/14/2014 Time: 1771-1657 SLP Time Calculation (min) (ACUTE ONLY): 9 min  Assessment / Plan / Recommendation Clinical Impression  Alert, responsive, sitting in recliner consuming solid texture and thin liquids.  No indications of aspiration during session.  Pt. Reports globus sensation intermittently; SLP provided verbal education for to produce dry swallows and sips liquid if senses po's in pharynx and pt. Able to repeat after 3 minutes.  Straws allowed, however educated to potential difficulty. Continue regular diet texture and thin liquids; d/c ST at this time.   HPI HPI: Theresa H Upchurch is an 80 y.o. female with multiple medical problems who presents as a transfer from [facility]. Patient presented to [facility] not feeling well. She was noted to have bradycardia and also was noted to have hyperkalemia. Patient states that she has been having some vague abdominal pain. Pt reports needing her esophagus stretched a few years ago and has recently been having the sensation of foods sticking in her distal esophagus with associated pain. CXR with possible mild interstitial edema versus interstitial pneumonitis.   Pertinent Vitals Pain Assessment: No/denies pain  SLP Plan  All goals met;Discharge SLP treatment due to (comment)    Recommendations Diet recommendations: Regular;Thin liquid Liquids provided via: Cup;Straw Medication Administration: Whole meds with liquid (one at a time) Supervision: Patient able to self feed Compensations: Slow rate;Small sips/bites Postural Changes and/or Swallow Maneuvers: Seated upright 90 degrees;Upright 30-60 min after meal              Oral Care Recommendations: Oral care BID Follow up Recommendations: None Plan: All goals met;Discharge SLP treatment due to (comment)    GO     Houston Siren 07/14/2014,  9:17 AM   Orbie Pyo Colvin Caroli.Ed [employer] [phone]

## 2014-07-14 NOTE — Progress Notes (Signed)
Physical Therapy Treatment Patient Details Name: Theresa Mclean MRN: [id number] DOB: May 08, 1929 Today's Date: 07/14/2014    History of Present Illness      PT Comments    Pt continues with slow, steady progress.  She does require min to mod assist for mobility, but reports her son is able to provide physical assist to allow her d/c home with HHPT as opposed to SNF.  PT to continue per POC.  Follow Up Recommendations  Home health PT;Supervision/Assistance - 24 hour     Equipment Recommendations  None recommended by PT    Recommendations for Other Services       Precautions / Restrictions Precautions Precautions: Fall    Mobility  Bed Mobility               General bed mobility comments: Pt up in recliner.  Transfers   Equipment used: Rolling walker (2 wheeled)   Sit to Stand: Mod assist Stand pivot transfers: Min assist       General transfer comment: cueing for forward shift during sit to stand to transfer weight into forefoot, verbal cues for hand placement and sequencing  Ambulation/Gait Ambulation/Gait assistance: Min assist Ambulation Distance (Feet): 15 Feet Assistive device: Rolling walker (2 wheeled) Gait Pattern/deviations: Step-to pattern;Decreased stride length;Shuffle Gait velocity: decreased   General Gait Details: VCs for upright posture, assist for stability, improved with second trial of ambulation. Seated rest break required, ambulated on 3 liters SpO2 >90%   Stairs            Wheelchair Mobility    Modified Rankin (Stroke Patients Only)       Balance   Sitting-balance support: Feet supported;No upper extremity supported Sitting balance-Leahy Scale: Good     Standing balance support: Bilateral upper extremity supported;During functional activity Standing balance-Leahy Scale: Fair                      Cognition Arousal/Alertness: Awake/alert Behavior During Therapy: WFL for tasks assessed/performed Overall  Cognitive Status: Within Functional Limits for tasks assessed                      Exercises      General Comments        Pertinent Vitals/Pain Pain Assessment: No/denies pain    Home Living                      Prior Function            PT Goals (current goals can now be found in the care plan section) Progress towards PT goals: Progressing toward goals    Frequency  Min 3X/week    PT Plan Current plan remains appropriate    Co-evaluation             End of Session Equipment Utilized During Treatment: Gait belt Activity Tolerance: Patient limited by fatigue Patient left: in chair;with call bell/phone within reach     Time: 1039-1109 PT Time Calculation (min) (ACUTE ONLY): 30 min  Charges:  $Gait Training: 8-22 mins $Therapeutic Activity: 8-22 mins                    G Codes:      Lorriane Shire 07/14/2014, 11:33 AM

## 2014-07-14 NOTE — Progress Notes (Signed)
Discharge instructions and medications given to patient.  Prescriptions and home cpap machine given to patient.  Called patient's son [name] and went over discharge instructions and medications with him.  All questions answered.

## 2014-07-14 NOTE — Progress Notes (Signed)
Straight cath urine output of clear, yellow 600cc. Patient verbalizes some relief. BP down 154/50. Will continue to monitor.

## 2014-07-14 NOTE — Progress Notes (Signed)
Pt awake and alert with staff. Urinated 75cc. C/o lower abdominal pain and urge to urinate but unable to. Bladder scan shows 447cc. Notified on call, Fredirick Maudlin, NP. New orders received.

## 2014-07-14 NOTE — Progress Notes (Signed)
Inpatient Diabetes Program Recommendations  AACE/ADA: New Consensus Statement on Inpatient Glycemic Control (2013)  Target Ranges:  Prepandial:   less than 140 mg/dL      Peak postprandial:   less than 180 mg/dL (1-2 hours)      Critically ill patients:  140 - 180 mg/dL   Results for Theresa Mclean, Theresa Mclean (MRN [id number]) as of 07/14/2014 11:44  Ref. Range 07/13/2014 11:57 07/13/2014 17:03 07/13/2014 21:06 07/14/2014 00:25 07/14/2014 07:39  Glucose-Capillary Latest Range: 70-99 mg/dL 231 (Mclean) 154 (Mclean) 229 (Mclean)  190 (Mclean)   Diabetes history: DM2 Outpatient Diabetes medications: Lantus 15 units BID, Novolog 0-9 units ("sometimes, maybe once a day but sometimes not at all") Current orders for Inpatient glycemic control: Lantus 22 units bid, Novolog 0-15 units AC  Please consider adding mealtime Novolog.   Novolog correction does not appear to be sufficiently lowering post meal blood sugars. Consider adding  Novolog 3 units tid with meals in addition to the Novolog correction currently ordered.   Gentry Fitz, RN, BA, MHA, CDE Diabetes Coordinator Inpatient Diabetes Program  [phone] (Team Pager) [phone] [facility]) 07/14/2014 11:52 AM

## 2014-07-14 NOTE — Care Management Note (Signed)
CARE MANAGEMENT NOTE 07/14/2014  Patient:  Theresa Mclean, Theresa Mclean   Account Number:  [id number]  Date Initiated:  07/09/2014  Documentation initiated by:  [facility]  Subjective/Objective Assessment:   Admitted from outside ER with Hyerkalemia, brady and fever.     Action/Plan:   Anticipated DC Date:  07/14/2014   Anticipated DC Plan:  [place]  CM consult      Choice offered to / List presented to:             Status of service:  Completed, signed off Medicare Important Message given?  YES (If response is "NO", the following Medicare IM given date fields will be blank) Date Medicare IM given:  07/11/2014 Medicare IM given by:  [facility] Date Additional Medicare IM given:  07/14/2014 Additional Medicare IM given by:  [facility]  Discharge Disposition:  [place]  Per UR Regulation:  Reviewed for med. necessity/level of care/duration of stay  If discussed at [place] of Stay Meetings, dates discussed:    Comments:  ContactSuezanne Cheshire Daughter [phone]    Pines,Thomas Son [phone]  [phone]   07/14/2014 HHPT arranged with [facility] with services to begin in 24-48 hr, all info faxed to that agency. Spoke with pt son who is aware of agency providing services.  CRoyal RN MPH, case manager, [phone]   07-11-14  3:15pm Luz Lex, RNBSN [phone] Sitting up in chair.  Awake and alert.  States lives with son in [place], [place].  States son is sick and does not work. States she does not care for him  - he is able to care for self and prepares their meals.  Her desire is to go back home.  She would like assistance - Has never had [facility] prior. Explained she would be eligible for at least [facility] PT - could probably use a NT also.  Will need face to face and [place] orders placed.   CM will continue to follow. Options for HH in [place] appear to be available out of [place]  [place] [address] [phone]  [facility] [address] [phone]  [facility] [address] [phone]

## 2014-07-14 NOTE — Discharge Summary (Signed)
Physician Discharge Summary  Theresa H Hollingshead YKD:[id number] DOB: 10/18/1928 DOA: 07/08/2014  PCP: Glenda Chroman., MD  Admit date: 07/08/2014 Discharge date: 07/14/2014  Time spent: 35 minutes  Recommendations for Outpatient Follow-up:  1. GI referral if constipation continues to be a problem 2. Limit narcotics 3. Increase exercise 4. Home health and 24 hour supervision by family 40. CMP with ionized CA 1 week- d/c'd calcium supplement as Ca was trending up 6. Monitor blood sugar and bring to PCP 7. Cpap at night  Discharge Diagnoses:  Active Problems:   CAD (coronary artery disease)   Chronic diastolic heart failure   Bradycardia   Diabetes   Essential hypertension   Acute combined systolic and diastolic congestive heart failure   Hyperkalemia   Chronic kidney disease   Chest pain   Pain in the chest   Discharge Condition: improved  Diet recommendation: cardiac/diabetic  Filed Weights   07/12/14 0335 07/12/14 2057 07/13/14 2105  Weight: 95.7 kg (210 lb 15.7 oz) 94.756 kg (208 lb 14.4 oz) 93.622 kg (206 lb 6.4 oz)    History of present illness:  Theresa Mclean is a 80 y.o. female with multiple medical problems presents as a transfer from [facility]. Patient presented to [facility] not feeling well. She was noted to have bradycardia and also was noted to have hyperkalemia. Patient states that she has been having some vague abdominal pain. She has no diarrhea no vomiting. She denies having any chest pain. She states there is no headache noted. She has been feeling very fatigued however. Patient states that she has chronic edema and her left leg has been more swollen than the right side since her knee surgery. Patient had lab work done at [facility] and her initial potassium was 6.1 with a creatinine of 1.53 apparently has a baseline of 0.9 on her creatinine  Hospital Course:  Abdominal pain nausea vomiting.  -resolved, Likely gastroenteritis, however x-ray showed  evidence of stool burden, -bowel regimen initiated, given SMOG enema with good results -continue dulcolax, senna and miralax- enema if no BM after 2 days- limit narcotics and increase movement  Bradycardia and hyperkalemia upon arrival to [facility] ER. - Currently in sinus rhythm, beta blocker discontinued, hyperkalemia corrected, monitor on telemetry,  - ECHo with normal EF and no wall motion abnormalities - TSH is stable, cardiology following, rate stable.  CAD - T-wave inversion in lateral leads on EKG. Chest pain-free. Troponin negative , beta blocker held due to bradycardia, echo without any wall motion abnormality and a preserved EF of 60%, cardiology following -Continue aspirin, statin, Ranexa, PRN sublingual nitroglycerin. - Cardiology has ordered Lexiscan stress test on 07/10/2014, which patient was refusing, underwent Resting scan yesterday, and has refused to finish again today- can do outpatient  DM type II. Continue Lantus and sliding scale, hgbAic 8.2 -needs further outpatient titration  Essential hypertension - Continue Norvasc, increased hydralazine and Catapres added by cardiology for better control. Beta blocker held due to bradycardia.   Possible CAP . Afebrile, mild leukocytosis, placed on Levaquin, speech eval and monitor. -treated  Chr diastolic congestive heart failure Ef 60% - compensated this admission. -resume lasix and aldactone  GERD - PPI  Diabetic neuropathy. Neurontin dose reduced due to somnolence.  Indwelling Foley catheter. Placed recently at [facility]. Was removed 11/12, able to void  OSA. Daily at bedtime CPAP. Non-compliant with C Pap at time. Patient counseled in warned about adverse consequences including a respiratorry arrest and death she understands it. Family will be  informed.    Procedures:    Consultations:  cardiology  Discharge Exam: Filed Vitals:   07/14/14 0928  BP: 141/64  Pulse: 98  Temp: 98.4 F (36.9 C)  Resp:      General: A+Ox3, NAD- feeling better- did well with PT- home health Cardiovascular: rrr Respiratory: clear  Discharge Instructions You were cared for by a hospitalist during your hospital stay. If you have any questions about your discharge medications or the care you received while you were in the hospital after you are discharged, you can call the unit and asked to speak with the hospitalist on call if the hospitalist that took care of you is not available. Once you are discharged, your primary care physician will handle any further medical issues. Please note that NO REFILLS for any discharge medications will be authorized once you are discharged, as it is imperative that you return to your primary care physician (or establish a relationship with a primary care physician if you do not have one) for your aftercare needs so that they can reassess your need for medications and monitor your lab values.  Discharge Instructions    Diet - low sodium heart healthy    Complete by:  As directed      Diet Carb Modified    Complete by:  As directed      Discharge instructions    Complete by:  As directed   Home health with 24 hour care Need good bowel regimen limit narcotics, increase exercise, miralax and other agents PCP for further constipation work up Outpatient stress test for recurrent chest pain if patient agreebale     Increase activity slowly    Complete by:  As directed           Current Discharge Medication List    START taking these medications   Details  bisacodyl (DULCOLAX) 10 MG suppository Place 1 suppository (10 mg total) rectally 2 (two) times daily. Qty: 12 suppository, Refills: 0    cloNIDine (CATAPRES - DOSED IN MG/24 HR) 0.1 mg/24hr patch Place 1 patch (0.1 mg total) onto the skin once a week. Qty: 4 patch, Refills: 12    feeding supplement, GLUCERNA SHAKE, (GLUCERNA SHAKE) LIQD Take 237 mLs by mouth daily after supper. Qty: 12 Can, Refills: 2    senna (SENOKOT)  8.6 MG TABS tablet Take 1 tablet (8.6 mg total) by mouth 2 (two) times daily. Qty: 120 each, Refills: 0      CONTINUE these medications which have CHANGED   Details  docusate sodium 100 MG CAPS Take 200 mg by mouth 2 (two) times daily. Qty: 10 capsule, Refills: 0    gabapentin (NEURONTIN) 100 MG capsule Take 2 capsules (200 mg total) by mouth 2 (two) times daily. Qty: 120 capsule, Refills: 0    hydrALAZINE (APRESOLINE) 100 MG tablet Take 1 tablet (100 mg total) by mouth 3 (three) times daily. Qty: 90 tablet, Refills: 0    insulin glargine (LANTUS) 100 UNIT/ML injection Inject 0.22 mLs (22 Units total) into the skin 2 (two) times daily. Qty: 10 mL, Refills: 11      CONTINUE these medications which have NOT CHANGED   Details  acitretin (SORIATANE) 10 MG capsule Take 10 mg by mouth daily with supper.    amLODipine (NORVASC) 10 MG tablet Take 10 mg by mouth daily.    aspirin 81 MG tablet Take 81 mg by mouth daily.    atorvastatin (LIPITOR) 20 MG tablet Take 20 mg by mouth  at bedtime.     clobetasol (OLUX) 0.05 % topical foam Apply to scalp daily as needed    fexofenadine (ALLEGRA) 180 MG tablet Take 180 mg by mouth daily. During allergy seasons.    folic acid (FOLVITE) 1 MG tablet Take 1 mg by mouth daily.    furosemide (LASIX) 40 MG tablet TAKE 1 TABLET BY MOUTH DAILY. Qty: 30 tablet, Refills: 6    HYDROcodone-acetaminophen (NORCO) 7.5-325 MG per tablet Take 1 tablet by mouth every 6 (six) hours as needed for moderate pain.    insulin aspart (NOVOLOG) 100 UNIT/ML injection SLIDING SCALE 0-9 Units, Injected subcutaneous, 3 times daily with meals. CBG < 70: implement hypoglycemia protocol CBG 70 - 120: 0 units CBG 121 - 150: 1 unit CBG 151 - 200: 2 units CBG 201 - 250: 3 units CBG 251 - 300: 5 units CBG 301 - 350: 7 units CBG 351 - 400: 9 units CBG > 400: call MD    Linaclotide (LINZESS) 290 MCG CAPS capsule Take 290 mcg by mouth daily.    methotrexate (RHEUMATREX) 2.5  MG tablet Take 7.5 mg by mouth every Wednesday. Caution:Chemotherapy. Protect from light. Takes on Wednesday.    montelukast (SINGULAIR) 10 MG tablet Take 10 mg by mouth daily.    nitroGLYCERIN (NITROSTAT) 0.4 MG SL tablet Place 1 tablet (0.4 mg total) under the tongue every 5 (five) minutes as needed for chest pain. Qty: 25 tablet, Refills: 3    pantoprazole (PROTONIX) 40 MG tablet Take 40 mg by mouth daily.     ranolazine (RANEXA) 500 MG 12 hr tablet Take 1 tablet (500 mg total) by mouth 2 (two) times daily. Qty: 28 tablet, Refills: 0    spironolactone (ALDACTONE) 25 MG tablet Take 1 tablet (25 mg total) by mouth daily. Qty: 30 tablet, Refills: 1    methocarbamol (ROBAXIN) 750 MG tablet Take 1 po QID for muscle pain Qty: 40 tablet, Refills: 0    polyethylene glycol (MIRALAX / GLYCOLAX) packet Take 17 g by mouth daily as needed for moderate constipation.      STOP taking these medications     atenolol (TENORMIN) 50 MG tablet      potassium chloride SA (K-DUR,KLOR-CON) 20 MEQ tablet        No Known Allergies    The results of significant diagnostics from this hospitalization (including imaging, microbiology, ancillary and laboratory) are listed below for reference.    Significant Diagnostic Studies: Ct Head Wo Contrast  07/11/2014   CLINICAL DATA:  Confusion and headache; nausea and vomit  EXAM: CT HEAD WITHOUT CONTRAST  TECHNIQUE: Contiguous axial images were obtained from the base of the skull through the vertex without intravenous contrast.  COMPARISON:  July 08, 2014  FINDINGS: Age related volume loss is stable. There is no mass, hemorrhage, extra-axial fluid collection, or midline shift. Mild small vessel disease in the centra semiovale bilaterally is stable. There is no new gray-white compartment lesion. No acute infarct present. Basal ganglia calcification is felt to be physiologic in this age group. There is frontal hyperostosis bilaterally. The bony calvarium  appears intact. The mastoid air cells are clear.  IMPRESSION: Age related volume loss with mild supratentorial small vessel disease. No intracranial mass, hemorrhage, or acute appearing infarct.   Electronically Signed   By: Lowella Grip M.D.   On: 07/11/2014 09:48   Nm Myocar Single W/spect W/wall Motion And Ef  07/11/2014   CLINICAL DATA:  Patient is an 81 yo with CP Test  to evaluate for ischemia  EXAM: MYOCARDIAL IMAGING WITH SPECT (REST)  TECHNIQUE: Standard myocardial SPECT imaging was performed after resting intravenous injection of Tc-28m sestamibi. Quantitative gated imaging was also performed to evaluate left ventricular wall motion, and estimate left ventricular ejection fraction.  COMPARISON:  None.  FINDINGS: Nuclear data; Rest images only. There is thinning in the inferolateral wall (base, mid). Anterolateral wall (base) Otherwise normal perfusion.  IMPRESSION: Rest myoview. Thinning in the basal anterolateral wall and inferolateral wall (base, mid). Otherwise normal perfusion. Images not gated   Electronically Signed   By: Dorris Carnes M.D.   On: 07/11/2014 13:14   Dg Chest Port 1 View  07/11/2014   CLINICAL DATA:  Shortness of breath with low-grade fever and headache.  EXAM: PORTABLE CHEST - 1 VIEW  COMPARISON:  Chest x-ray from an acute abdominal series of July 09, 2014, and a portable chest x-ray of July 08, 2014  FINDINGS: The lungs are adequately inflated. The interstitial markings are more prominent bilaterally. The cardiopericardial silhouette remains enlarged. Its margins are less distinct today. The pulmonary vascularity is engorged and indistinct. There is no pleural effusion. The bony thorax is unremarkable.  IMPRESSION: Congestive heart failure with pulmonary interstitial edema which has worsened since the study of 2 days ago.   Electronically Signed   By: David  Martinique   On: 07/11/2014 10:51   Dg Chest Port 1 View  07/08/2014   CLINICAL DATA:  Congestive heart  failure.  EXAM: PORTABLE CHEST - 1 VIEW  COMPARISON:  07/08/2014 earlier and 08/12/2013  FINDINGS: Lungs are hypoinflated and demonstrate slight worsening bilateral perihilar opacification/interstitial prominence. No definite effusion. Stable cardiomegaly. Remainder the exam is unchanged.  IMPRESSION: Slight worsening bilateral perihilar opacification/interstitial prominence which may be due to mild interstitial edema versus interstitial pneumonitis.  Stable cardiomegaly.   Electronically Signed   By: Marin Olp M.D.   On: 07/08/2014 23:42   Dg Abd Acute W/chest  07/09/2014   CLINICAL DATA:  Difficulty breathing ; abdominal pain  EXAM: ACUTE ABDOMEN SERIES (ABDOMEN 2 VIEW & CHEST 1 VIEW)  COMPARISON:  Chest radiograph July 08, 2014 and July 09, 2013 ; CT abdomen and pelvis July 08, 2014  FINDINGS: PA chest: There is scarring in the left base. There is stable generalized interstitial prominence, probably reflecting chronic interstitial fibrosis. No new opacity seen. Heart is mildly enlarged with pulmonary vascularity within normal limits. No adenopathy.  Supine and left lateral decubitus abdomen: There is diffuse stool throughout the colon. There is no bowel dilatation or air-fluid level to suggest obstruction. No free air. There are multiple phleboliths in the pelvis. Calcifications are noted to the right of L4 and L5. Suspect small calcified mesenteric lymph nodes are possible phleboliths.  IMPRESSION: Diffuse stool in colon. Bowel gas pattern unremarkable. Probable chronic interstitial fibrotic type change. No frank edema or consolidation. Stable mild cardiac enlargement.   Electronically Signed   By: Lowella Grip M.D.   On: 07/09/2014 13:21    Microbiology: Recent Results (from the past 240 hour(s))  MRSA PCR Screening     Status: Abnormal   Collection Time: 07/08/14  9:55 PM  Result Value Ref Range Status   MRSA by PCR POSITIVE (A) NEGATIVE Final    Comment:        The GeneXpert  MRSA Assay (FDA approved for NASAL specimens only), is one component of a comprehensive MRSA colonization surveillance program. It is not intended to diagnose MRSA infection nor to guide or monitor  treatment for MRSA infections. RESULT CALLED TO, READ BACK BY AND VERIFIED WITH: M.EVANGALISTA,RN AT 0119 AT 07/09/18 BY L.PITT      Labs: Basic Metabolic Panel:  Recent Labs Lab 07/09/14 0415 07/10/14 0316 07/12/14 0357 07/13/14 0524 07/14/14 0025  NA 141 139 139 136* 136*  K 4.0 4.2 3.8 4.2 4.0  CL 103 101 101 97 96  CO2 24 24 24 25 27   GLUCOSE 204* 176* 113* 122* 192*  BUN 34* 22 11 11 15   CREATININE 0.99 0.87 0.76 0.88 0.98  CALCIUM 10.8* 10.8* 10.8* 11.2* 11.2*   Liver Function Tests:  Recent Labs Lab 07/09/14 0026 07/09/14 0415 07/12/14 0357  AST 15 15 18   ALT 24 24 18   ALKPHOS 69 66 59  BILITOT 0.5 0.6 0.7  PROT 7.7 7.4 7.1  ALBUMIN 3.5 3.6 3.0*   No results for input(s): LIPASE, AMYLASE in the last 168 hours. No results for input(s): AMMONIA in the last 168 hours. CBC:  Recent Labs Lab 07/09/14 0026 07/09/14 0415 07/10/14 0316 07/12/14 0357 07/14/14 0025  WBC 12.4* 13.0* 13.2* 8.2 9.1  HGB 10.8* 10.7* 10.3* 10.1* 11.2*  HCT 34.5* 34.4* 33.4* 32.4* 36.2  MCV 81.0 80.2 80.7 81.6 81.5  PLT PLATELET CLUMPS NOTED ON SMEAR, UNABLE TO ESTIMATE 212 166 193 248   Cardiac Enzymes:  Recent Labs Lab 07/09/14 0026 07/09/14 0415 07/09/14 1048  TROPONINI <0.30 <0.30 <0.30   BNP: BNP (last 3 results)  Recent Labs  08/10/13 1556  PROBNP 378.0   CBG:  Recent Labs Lab 07/13/14 1157 07/13/14 1703 07/13/14 2106 07/14/14 0739 07/14/14 1214  GLUCAP 231* 154* 229* 190* 253*       Signed:  VANN, JESSICA  [facility] 07/14/2014, 1:04 PM

## 2014-07-14 NOTE — Clinical Social Work Note (Signed)
Patient discharging home today and will be transported home per family request by ambulance. [facility] has been called to transport patient her home in [place], [place].  Tully Mcinturff Givens, MSW, LCSW [phone]

## 2014-07-14 NOTE — Progress Notes (Signed)
Patient Demographics  Theresa Mclean, is a 80 y.o. female, DOB - [birth year], UJW:[id number]  Admit date - 07/08/2014   Admitting Physician Ripudeep Krystal Eaton, MD  Outpatient Primary MD for the patient is [name],[name], MD  LOS - 6   No chief complaint on file.     Brief summary  80 year old African-American female who was transferred from [facility] where she presented with abdominal pain and nausea, she was found to have hyperkalemia along with bradycardia, she was sent to [facility] for admission. Here workup revealed large stool burden on abdominal x-ray, EKG changes suggestive of T-wave inversion, cardiology was consulted. Bowel regimen was initiated. There was also suggestion of possible URI versus early pneumonia.  Subjective:   Theresa Mclean is feeling well today, no sOB, no CP   Assessment & Plan    Abdominal pain nausea vomiting.  -resolved, Likely gastroenteritis, however x-ray showed evidence of stool burden, -bowel regimen initiated, given SMOG enema with good results -continue dulcolax, senna and miralax- enema if no BM after 2 days  Bradycardia and hyperkalemia upon arrival to [facility] ER. - Currently in sinus rhythm, beta blocker discontinued, hyperkalemia corrected, monitor on telemetry,  - ECHo with normal EF and no wall motion abnormalities - TSH is stable, cardiology following, rate stable.  CAD - T-wave inversion in lateral leads on EKG. Chest pain-free. Troponin negative , beta blocker held due to bradycardia, echo without any wall motion abnormality and a preserved EF of 60%, cardiology following -Continue aspirin, statin, Ranexa, PRN sublingual nitroglycerin. - Cardiology has ordered Lexiscan stress test on 07/10/2014, which patient was refusing, underwent Resting  scan yesterday, and has refused to finish again today- can do outpatient  DM type II. Continue Lantus and sliding scale, hgbAic 8.2  CBG (last 3)   Recent Labs  07/13/14 1157 07/13/14 1703 07/13/14 2106  GLUCAP 231* 154* 229*    Essential hypertension - Continue Norvasc, increased hydralazine and Catapres added by cardiology for better control. Beta blocker held due to bradycardia.     Possible CAP . Afebrile, mild leukocytosis, placed on Levaquin, speech eval and monitor. -change to PO  Chr diastolic congestive heart failure Ef 60% - compensated this admission.  -resume lasix and aldactone   GERD - PPI  Diabetic neuropathy. Continue Neurontin dose reduced due to somnolence.  Indwelling Foley catheter. Placed recently at [facility]. Was removed 11/12, able to void   OSA. Daily at bedtime CPAP. Non-compliant with C Pap at time. Patient counseled in warned about adverse consequences including a respiratorry arrest and death she understands it. Family will be informed.  DVt proph: hep SQ  Code Status: Full  Family Communication:  Son-  Disposition Plan:  AWAIT PT EVAL TO SEE IF HOME VS SNF (has not been seen since 11/11)   Procedures  Echo, patient  refused [place]   Cardiology   Medications  Scheduled Meds: . acitretin  10 mg Oral Q supper  . amLODipine  10 mg Oral Daily  . aspirin EC  81 mg Oral Daily  . atorvastatin  20 mg Oral QHS  . bisacodyl  10 mg Rectal BID  . calcium-vitamin D  1 tablet Oral Q breakfast  . cloNIDine  0.1 mg Transdermal Weekly  . docusate sodium  200 mg Oral BID  . feeding supplement (GLUCERNA SHAKE)  237 mL Oral QPC supper  . folic acid  1 mg Oral Daily  . furosemide  40 mg Oral Daily  . gabapentin  200 mg Oral BID  . heparin  5,000 Units Subcutaneous 3 times per day  . hydrALAZINE  100 mg Oral TID  . insulin aspart  0-15 Units Subcutaneous TID WC  . insulin glargine  22 Units Subcutaneous BID  . levofloxacin  750 mg  Oral Daily  . methotrexate  7.5 mg Oral Q Wed  . montelukast  10 mg Oral Daily  . multivitamin with minerals  1 tablet Oral Daily  . pantoprazole  40 mg Oral Daily  . polyethylene glycol  17 g Oral BID  . ranolazine  500 mg Oral BID  . senna  1 tablet Oral BID  . sodium chloride  3 mL Intravenous Q12H  . sorbitol, milk of mag, mineral oil, glycerin (SMOG) enema  960 mL Rectal Once  . spironolactone  25 mg Oral Daily  . thiamine  100 mg Oral Daily   Continuous Infusions:  PRN Meds:.acetaminophen **OR** [DISCONTINUED] acetaminophen, HYDROcodone-acetaminophen, methocarbamol, morphine injection, nitroGLYCERIN, [DISCONTINUED] ondansetron **OR** ondansetron (ZOFRAN) IV  DVT Prophylaxis    Heparin    Lab Results  Component Value Date   PLT 248 07/14/2014    Antibiotics     Anti-infectives    Start     Dose/Rate Route Frequency Ordered Stop   07/13/14 1000  levofloxacin (LEVAQUIN) tablet 750 mg     750 mg Oral Daily 07/12/14 1055     07/12/14 1000  levofloxacin (LEVAQUIN) tablet 500 mg  Status:  Discontinued     500 mg Oral Daily 07/12/14 0745 07/12/14 1055   07/10/14 1500  levofloxacin (LEVAQUIN) IVPB 750 mg  Status:  Discontinued     750 mg100 mL/hr over 90 Minutes Intravenous Every 24 hours 07/10/14 1330 07/12/14 0745   07/09/14 0900  levofloxacin (LEVAQUIN) IVPB 750 mg  Status:  Discontinued     750 mg100 mL/hr over 90 Minutes Intravenous Every 48 hours 07/09/14 0814 07/10/14 1330          Objective:   Filed Vitals:   07/13/14 2313 07/14/14 0535 07/14/14 0545 07/14/14 0638  BP:  177/55 174/56 154/50  Pulse: 94 103  93  Temp:  98.4 F (36.9 C)    TempSrc:  Oral    Resp: 20 20    Height:      Weight:      SpO2: 96% 93%      Wt Readings from Last 3 Encounters:  07/13/14 93.622 kg (206 lb 6.4 oz)  05/26/14 99.338 kg (219 lb)  02/07/14 96.616 kg (213 lb)     Intake/Output Summary (Last 24 hours) at 07/14/14 0808 Last data filed at 07/14/14 0700  Gross per 24  hour  Intake    580 ml  Output    700 ml  Net   -120 ml     Physical Exam  Pleasant/cooperative Symmetrical  Chest wall movement, Good air movement bilaterally, bibasilar rales L>R RRR,No Gallops,Rubs or new Murmurs, No Parasternal Heave +ve B.Sounds, Abd Soft, distended, No tenderness, No organomegaly appriciated, No rebound - guarding or rigidity. No Cyanosis, Clubbing or edema, No new Rash or bruise      Data Review   Micro Results Recent Results (from the past 240 hour(s))  MRSA PCR Screening     Status: Abnormal   Collection Time: 07/08/14  9:55 PM  Result Value Ref Range Status   MRSA by PCR POSITIVE (A) NEGATIVE Final    Comment:        The GeneXpert MRSA Assay (FDA approved for NASAL specimens only), is one component of a comprehensive MRSA colonization surveillance program. It is not intended to diagnose MRSA infection nor to guide or monitor treatment for MRSA infections. RESULT CALLED TO, READ BACK BY AND VERIFIED WITH: M.EVANGALISTA,RN AT 0119 AT 07/09/18 BY L.PITT     Radiology Reports Ct Head Wo Contrast  07/11/2014   CLINICAL DATA:  Confusion and headache; nausea and vomit  EXAM: CT HEAD WITHOUT CONTRAST  TECHNIQUE: Contiguous axial images were obtained from the base of the skull through the vertex without intravenous contrast.  COMPARISON:  July 08, 2014  FINDINGS: Age related volume loss is stable. There is no mass, hemorrhage, extra-axial fluid collection, or midline shift. Mild small vessel disease in the centra semiovale bilaterally is stable. There is no new gray-white compartment lesion. No acute infarct present. Basal ganglia calcification is felt to be physiologic in this age group. There is frontal hyperostosis bilaterally. The bony calvarium appears intact. The mastoid air cells are clear.  IMPRESSION: Age related volume loss with mild supratentorial small vessel disease. No intracranial mass, hemorrhage, or acute appearing infarct.    Electronically Signed   By: Lowella Grip M.D.   On: 07/11/2014 09:48   Nm Myocar Single W/spect W/wall Motion And Ef  07/11/2014   CLINICAL DATA:  Patient is an 80 yo with CP Test to evaluate for ischemia  EXAM: MYOCARDIAL IMAGING WITH SPECT (REST)  TECHNIQUE: Standard myocardial SPECT imaging was performed after resting intravenous injection of Tc-76m sestamibi. Quantitative gated imaging was also performed to evaluate left ventricular wall motion, and estimate left ventricular ejection fraction.  COMPARISON:  None.  FINDINGS: Nuclear data; Rest images only. There is thinning in the inferolateral wall (base, mid). Anterolateral wall (base) Otherwise normal perfusion.  IMPRESSION: Rest myoview. Thinning in the basal anterolateral wall and inferolateral wall (base, mid). Otherwise normal perfusion. Images not gated   Electronically Signed   By: Dorris Carnes M.D.   On: 07/11/2014 13:14   Dg Chest Port 1 View  07/11/2014   CLINICAL DATA:  Shortness of breath with low-grade fever and headache.  EXAM: PORTABLE CHEST - 1 VIEW  COMPARISON:  Chest x-ray from an acute abdominal series of July 09, 2014, and a portable chest x-ray of July 08, 2014  FINDINGS: The lungs are adequately inflated. The interstitial markings are more prominent bilaterally. The cardiopericardial silhouette remains enlarged. Its margins are less distinct today. The pulmonary vascularity is engorged and indistinct. There is no pleural effusion. The bony thorax is unremarkable.  IMPRESSION: Congestive heart failure with pulmonary interstitial edema which has worsened since the study of 2 days ago.   Electronically Signed   By: David  Martinique   On: 07/11/2014 10:51   Dg Chest Port 1 View  07/08/2014   CLINICAL DATA:  Congestive heart failure.  EXAM: PORTABLE CHEST -  1 VIEW  COMPARISON:  07/08/2014 earlier and 08/12/2013  FINDINGS: Lungs are hypoinflated and demonstrate slight worsening bilateral perihilar opacification/interstitial  prominence. No definite effusion. Stable cardiomegaly. Remainder the exam is unchanged.  IMPRESSION: Slight worsening bilateral perihilar opacification/interstitial prominence which may be due to mild interstitial edema versus interstitial pneumonitis.  Stable cardiomegaly.   Electronically Signed   By: Marin Olp M.D.   On: 07/08/2014 23:42   Dg Abd Acute W/chest  07/09/2014   CLINICAL DATA:  Difficulty breathing ; abdominal pain  EXAM: ACUTE ABDOMEN SERIES (ABDOMEN 2 VIEW & CHEST 1 VIEW)  COMPARISON:  Chest radiograph July 08, 2014 and July 09, 2013 ; CT abdomen and pelvis July 08, 2014  FINDINGS: PA chest: There is scarring in the left base. There is stable generalized interstitial prominence, probably reflecting chronic interstitial fibrosis. No new opacity seen. Heart is mildly enlarged with pulmonary vascularity within normal limits. No adenopathy.  Supine and left lateral decubitus abdomen: There is diffuse stool throughout the colon. There is no bowel dilatation or air-fluid level to suggest obstruction. No free air. There are multiple phleboliths in the pelvis. Calcifications are noted to the right of L4 and L5. Suspect small calcified mesenteric lymph nodes are possible phleboliths.  IMPRESSION: Diffuse stool in colon. Bowel gas pattern unremarkable. Probable chronic interstitial fibrotic type change. No frank edema or consolidation. Stable mild cardiac enlargement.   Electronically Signed   By: Lowella Grip M.D.   On: 07/09/2014 13:21     CBC  Recent Labs Lab 07/09/14 0026 07/09/14 0415 07/10/14 0316 07/12/14 0357 07/14/14 0025  WBC 12.4* 13.0* 13.2* 8.2 9.1  HGB 10.8* 10.7* 10.3* 10.1* 11.2*  HCT 34.5* 34.4* 33.4* 32.4* 36.2  PLT PLATELET CLUMPS NOTED ON SMEAR, UNABLE TO ESTIMATE 212 166 193 248  MCV 81.0 80.2 80.7 81.6 81.5  MCH 25.4* 24.9* 24.9* 25.4* 25.2*  MCHC 31.3 31.1 30.8 31.2 30.9  RDW 19.5* 19.7* 19.9* 20.0* 19.7*    Chemistries   Recent Labs Lab  07/09/14 0026 07/09/14 0415 07/10/14 0316 07/12/14 0357 07/13/14 0524 07/14/14 0025  NA 137 141 139 139 136* 136*  K 4.3 4.0 4.2 3.8 4.2 4.0  CL 101 103 101 101 97 96  CO2 20 24 24 24 25 27   GLUCOSE 189* 204* 176* 113* 122* 192*  BUN 36* 34* 22 11 11 15   CREATININE 1.09 0.99 0.87 0.76 0.88 0.98  CALCIUM 10.6* 10.8* 10.8* 10.8* 11.2* 11.2*  AST 15 15  --  18  --   --   ALT 24 24  --  18  --   --   ALKPHOS 69 66  --  59  --   --   BILITOT 0.5 0.6  --  0.7  --   --    ------------------------------------------------------------------------------------------------------------------ estimated creatinine clearance is 48.3 mL/min (by C-G formula based on Cr of 0.98). ------------------------------------------------------------------------------------------------------------------ No results for input(s): HGBA1C in the last 72 hours. ------------------------------------------------------------------------------------------------------------------ No results for input(s): CHOL, HDL, LDLCALC, TRIG, CHOLHDL, LDLDIRECT in the last 72 hours. ------------------------------------------------------------------------------------------------------------------ No results for input(s): TSH, T4TOTAL, T3FREE, THYROIDAB in the last 72 hours.  Invalid input(s): FREET3 ------------------------------------------------------------------------------------------------------------------ No results for input(s): VITAMINB12, FOLATE, FERRITIN, TIBC, IRON, RETICCTPCT in the last 72 hours.  Coagulation profile  Recent Labs Lab 07/09/14 0415  INR 1.11    No results for input(s): DDIMER in the last 72 hours.  Cardiac Enzymes  Recent Labs Lab 07/09/14 0026 07/09/14 0415 07/09/14 1048  TROPONINI <0.30 <0.30 <0.30   ------------------------------------------------------------------------------------------------------------------  Invalid input(s): POCBNP     Time Spent in minutes   25   [place],  Olivene Cookston DO on 07/14/2014 at 8:08 AM  Between 7am to 7pm - Pager - [phone]  After 7pm go to [email or web address] - password [email or web address]  And look for the night coverage person covering for me after hours  [facility] Office  [phone]

## 2014-07-31 ENCOUNTER — Encounter: Payer: Self-pay | Admitting: Cardiovascular Disease

## 2014-07-31 ENCOUNTER — Ambulatory Visit (INDEPENDENT_AMBULATORY_CARE_PROVIDER_SITE_OTHER): Payer: Medicare Other | Admitting: Cardiovascular Disease

## 2014-07-31 VITALS — BP 143/70 | HR 80 | Ht 64.0 in | Wt 209.0 lb

## 2014-07-31 DIAGNOSIS — I10 Essential (primary) hypertension: Secondary | ICD-10-CM

## 2014-07-31 DIAGNOSIS — I5032 Chronic diastolic (congestive) heart failure: Secondary | ICD-10-CM

## 2014-07-31 DIAGNOSIS — I739 Peripheral vascular disease, unspecified: Secondary | ICD-10-CM

## 2014-07-31 DIAGNOSIS — N189 Chronic kidney disease, unspecified: Secondary | ICD-10-CM

## 2014-07-31 DIAGNOSIS — G4733 Obstructive sleep apnea (adult) (pediatric): Secondary | ICD-10-CM

## 2014-07-31 DIAGNOSIS — Z87898 Personal history of other specified conditions: Secondary | ICD-10-CM

## 2014-07-31 DIAGNOSIS — Z9289 Personal history of other medical treatment: Secondary | ICD-10-CM

## 2014-07-31 DIAGNOSIS — E785 Hyperlipidemia, unspecified: Secondary | ICD-10-CM

## 2014-07-31 DIAGNOSIS — I214 Non-ST elevation (NSTEMI) myocardial infarction: Secondary | ICD-10-CM

## 2014-07-31 DIAGNOSIS — I25119 Atherosclerotic heart disease of native coronary artery with unspecified angina pectoris: Secondary | ICD-10-CM

## 2014-07-31 NOTE — Patient Instructions (Signed)
Your physician recommends that you schedule a follow-up appointment in: 3-4 months. Your physician has recommended you make the following change in your medication:  Stop clonidine patches. Continue all other medications the same.

## 2014-07-31 NOTE — Progress Notes (Signed)
Patient ID: Theresa Mclean, female   DOB: [birth year], 80 y.o.   MRN: [id number]      SUBJECTIVE: The patient was recently hospitalized for gastroenteritis , chest pain, bradycardia, and hyperkalemia. Troponins were found to be normal. Echocardiogram demonstrated normal left ventricular systolic function, EF 40-97%, moderate LVH, grade 1 diastolic dysfunction, and mild aortic stenosis. Atenolol was discontinued. Nuclear stress test rest imaging was obtained which essentially demonstrated wall thinning with normal perfusion. ECG noted to demonstrate more pronounced inferolateral T wave inversions when compared to ECG in 2014. She is feeling well today, especially after physical therapy yesterday. She denies chest pain and says her breathing is stable. She is not using oxygen today. [name] sometimes forgets to place her on the clonidine patch.  Children: Lives with son [name], also my patient) and has daughter Thayer Headings).  Review of Systems: As per "subjective", otherwise negative.  No Known Allergies  Current Outpatient Prescriptions  Medication Sig Dispense Refill  . acitretin (SORIATANE) 10 MG capsule Take 10 mg by mouth daily with supper.    Marland Kitchen amLODipine (NORVASC) 10 MG tablet Take 10 mg by mouth daily.    Marland Kitchen aspirin 81 MG tablet Take 81 mg by mouth daily.    Marland Kitchen atorvastatin (LIPITOR) 20 MG tablet Take 20 mg by mouth at bedtime.     . bisacodyl (DULCOLAX) 10 MG suppository Place 1 suppository (10 mg total) rectally 2 (two) times daily. 12 suppository 0  . clobetasol (OLUX) 0.05 % topical foam Apply to scalp daily as needed    . cloNIDine (CATAPRES - DOSED IN MG/24 HR) 0.1 mg/24hr patch Place 1 patch (0.1 mg total) onto the skin once a week. 4 patch 12  . docusate sodium 100 MG CAPS Take 200 mg by mouth 2 (two) times daily. 10 capsule 0  . feeding supplement, GLUCERNA SHAKE, (GLUCERNA SHAKE) LIQD Take 237 mLs by mouth daily after supper. 12 Can 2  . fexofenadine (ALLEGRA) 180 MG tablet Take  180 mg by mouth daily. During allergy seasons.    . folic acid (FOLVITE) 1 MG tablet Take 1 mg by mouth daily.    . furosemide (LASIX) 40 MG tablet TAKE 1 TABLET BY MOUTH DAILY. 30 tablet 6  . gabapentin (NEURONTIN) 100 MG capsule Take 2 capsules (200 mg total) by mouth 2 (two) times daily. 120 capsule 0  . hydrALAZINE (APRESOLINE) 100 MG tablet Take 1 tablet (100 mg total) by mouth 3 (three) times daily. 90 tablet 0  . HYDROcodone-acetaminophen (NORCO) 7.5-325 MG per tablet Take 1 tablet by mouth every 6 (six) hours as needed for moderate pain.    Marland Kitchen insulin aspart (NOVOLOG) 100 UNIT/ML injection SLIDING SCALE 0-9 Units, Injected subcutaneous, 3 times daily with meals. CBG < 70: implement hypoglycemia protocol CBG 70 - 120: 0 units CBG 121 - 150: 1 unit CBG 151 - 200: 2 units CBG 201 - 250: 3 units CBG 251 - 300: 5 units CBG 301 - 350: 7 units CBG 351 - 400: 9 units CBG > 400: call MD (Patient taking differently: Inject 0-9 Units into the skin 3 (three) times daily with meals. SLIDING SCALE 0-9 Units, Injected subcutaneous, 3 times daily with meals. CBG < 70: implement hypoglycemia protocol CBG 70 - 120: 0 units CBG 121 - 150: 1 unit CBG 151 - 200: 2 units CBG 201 - 250: 3 units CBG 251 - 300: 5 units CBG 301 - 350: 7 units CBG 351 - 400: 9 units CBG > 400: call  MD)    . insulin glargine (LANTUS) 100 UNIT/ML injection Inject 26 Units into the skin 2 (two) times daily.    . Linaclotide (LINZESS) 145 MCG CAPS capsule Take 145 mcg by mouth daily as needed.    . methocarbamol (ROBAXIN) 750 MG tablet Take 1 po QID for muscle pain 40 tablet 0  . methotrexate (RHEUMATREX) 2.5 MG tablet Take 7.5 mg by mouth every Wednesday. Caution:Chemotherapy. Protect from light. Takes on Wednesday.    . montelukast (SINGULAIR) 10 MG tablet Take 10 mg by mouth daily.    . nitroGLYCERIN (NITROSTAT) 0.4 MG SL tablet Place 1 tablet (0.4 mg total) under the tongue every 5 (five) minutes as needed for chest pain.  25 tablet 3  . pantoprazole (PROTONIX) 40 MG tablet Take 40 mg by mouth daily.     . polyethylene glycol (MIRALAX / GLYCOLAX) packet Take 17 g by mouth daily as needed for moderate constipation.    . ranolazine (RANEXA) 500 MG 12 hr tablet Take 1 tablet (500 mg total) by mouth 2 (two) times daily. 28 tablet 0  . senna (SENOKOT) 8.6 MG TABS tablet Take 1 tablet (8.6 mg total) by mouth 2 (two) times daily. 120 each 0  . spironolactone (ALDACTONE) 25 MG tablet Take 1 tablet (25 mg total) by mouth daily. 30 tablet 1   No current facility-administered medications for this visit.    Past Medical History  Diagnosis Date  . Hypertension   . Nephrolithiasis   . GERD (gastroesophageal reflux disease)   . Arthritis   . Psoriasis   . Morbid obesity   . Mitral regurgitation   . Hyperlipidemia   . Sleep apnea     uses cpap  . CHF (congestive heart failure)     Diastolic. EF 55-65% by cath 10/11/11  . Diabetes mellitus     insulin dependent  . Acute renal insufficiency     09/2011. not on ACEI secondary to this (also has renal artery stenosis)  . Peripheral vascular disease     severe right renal artery stenosis and left SFA stenosis by PV angio 09/2011, treated medically  . Cellulitis   . CAD (coronary artery disease)     NSTEMI 09/2011 felt poor candidate for CABG, instead s/p PTCA/DES to mid RCA 10/17/11  . Chronic respiratory failure   . Pulmonary hypertension   . Obstructive sleep apnea   . CKD (chronic kidney disease) stage 2, GFR 60-89 ml/min 07/11/2013    Past Surgical History  Procedure Laterality Date  . Acromio-clavicular joint repair  2011  . Abdominal hysterectomy    . Eye surgery      cataract  OD    History   Social History  . Marital Status: Divorced    Spouse Name: N/A    Number of Children: N/A  . Years of Education: N/A   Occupational History  . Not on file.   Social History Main Topics  . Smoking status: Never Smoker   . Smokeless tobacco: Never Used  .  Alcohol Use: No  . Drug Use: No  . Sexual Activity: No   Other Topics Concern  . Not on file   Social History Narrative     Filed Vitals:   07/31/14 1454  BP: 143/70  Pulse: 80  Height: 5\' 4"  (1.626 m)  Weight: 209 lb (94.802 kg)  SpO2: 96%    PHYSICAL EXAM General: NAD Neck: No JVD, no thyromegaly.  Lungs: Clear to auscultation bilaterally with normal respiratory  effort.  CV: Nondisplaced PMI. Regular rate and rhythm, normal S1/S2, no S3/S4, soft I/VI systolic murmur along left sternal border. No pretibial or periankle edema. No carotid bruit.  Abdomen: Soft, nontender,no distention.  Neurologic: Alert and oriented. Psych: Normal affect.  Skin: Normal. Musculoskeletal: No gross deformities. Extremities: No clubbing or cyanosis.   ECG: Most recent ECG reviewed.    ASSESSMENT AND PLAN: 1. Chronic diastolic heart failure: Compensated and euvolemic. Continue Lasix 40 mg daily, along with hydralazine and spironolactone. BP reasonably controlled.  2. Essential hypertension: Reasonably controlled on present therapy. She is not on an ACEI reportedly because of severe right renal artery stenosis. I will d/c clonidine patch altogether. Continue amlodipine 10 mg , hydralazine 100 mg 3 times a day and spironolactone 25 mg. 3. CAD: Resting nuclear images in 06/2014 as noted above. Has h/o PCI to RCA in 2013, with cath demonstrating 2-vessel disease in the RCA and with severe left circumflex disease (serial 90-95% mid LCx lesions by cath on 10-11-2011). Repeat nuclear myocardial perfusion study stress was normal in November of 2014. Continue ASA, Ranexa, and atorvastatin. No longer on beta blocker due to bradycardia. I do not feel repeat stress testing is indicated at this time given her lack of symptoms.  4. OSA: She has been compliant with CPAP.  5. PVD: Severe right renal artery stenosis and left SFA stenosis by PV angio 09/2011.  6. Hyperlipidemia: On Lipitor 20 mg daily.    Dispo: f/u 3-4 months.   Kate Sable, M.D., F.A.C.C.

## 2014-08-05 ENCOUNTER — Telehealth: Payer: Self-pay | Admitting: Cardiovascular Disease

## 2014-08-05 ENCOUNTER — Other Ambulatory Visit: Payer: Self-pay | Admitting: Cardiovascular Disease

## 2014-08-05 NOTE — Telephone Encounter (Signed)
Can safely proceed with colonoscopy.

## 2014-08-05 NOTE — Telephone Encounter (Signed)
Would like to know if [place] can have her colonscopy

## 2014-08-05 NOTE — Telephone Encounter (Signed)
Please advise 

## 2014-08-07 ENCOUNTER — Encounter (HOSPITAL_COMMUNITY): Payer: Self-pay | Admitting: Cardiovascular Disease

## 2014-08-07 NOTE — Telephone Encounter (Signed)
Notified. 

## 2014-08-11 ENCOUNTER — Other Ambulatory Visit: Payer: Self-pay | Admitting: Cardiovascular Disease

## 2014-08-12 ENCOUNTER — Ambulatory Visit (INDEPENDENT_AMBULATORY_CARE_PROVIDER_SITE_OTHER): Payer: Medicare Other | Admitting: Neurology

## 2014-08-12 ENCOUNTER — Encounter: Payer: Self-pay | Admitting: Neurology

## 2014-08-12 VITALS — BP 120/62 | HR 76

## 2014-08-12 DIAGNOSIS — I214 Non-ST elevation (NSTEMI) myocardial infarction: Secondary | ICD-10-CM

## 2014-08-12 DIAGNOSIS — G20 Parkinson's disease: Secondary | ICD-10-CM

## 2014-08-12 DIAGNOSIS — W19XXXA Unspecified fall, initial encounter: Secondary | ICD-10-CM

## 2014-08-12 DIAGNOSIS — H5500 Unspecified nystagmus: Secondary | ICD-10-CM

## 2014-08-12 NOTE — Patient Instructions (Signed)
1. We have scheduled you at [place] for your OPEN MRI on 08/27/14 at 12:00 pm. Please arrive 30 minutes prior and go to [place]. If you need to change this appt please call [phone].

## 2014-08-12 NOTE — Progress Notes (Signed)
Note faxed to Dr Woody Seller at [phone] with confirmation received.

## 2014-08-12 NOTE — Progress Notes (Signed)
Subjective:   Theresa Mclean was seen in consultation in the movement disorder clinic at the request of [name],[name], MD.  The evaluation is for tremor.  This patient is accompanied in the office by her child (son) who supplements the history.   The patient is a 80 y.o. right handed female with a history of tremor.  Pt states that it started in the last 1-2 months.  It seems to come and goes.  She seems to have it some days and not others.  It is most noticeable when the hands are not in use (at rest).  The L is most obvious and she doesn't notice the right much.  She can try and hold the arm and stop it for a little while. Her hydralazine was increased in November but she isn't sure if that is related.  Tremor: Yes.   Voice: some softer Sleep:   Vivid Dreams:  No.  Acting out dreams:  No. Wet Pillows: No. Postural symptoms:  Yes.    Falls?  No. (states that uses WC because of "bad knees" and doesn't walk now) Bradykinesia symptoms: slow movements, difficulty getting out of a chair and difficulty regaining balance Loss of smell:  Yes.   Loss of taste:  No. Urinary Incontinence:  No. Difficulty Swallowing:  No. (was troublesome but better now) Handwriting, micrographia: had accident in 1991 and hurt hand and hasn't written well since Trouble with ADL's:  Yes.  (needs assist)  Trouble buttoning clothing: Yes.   Depression:  No Memory changes:  Yes.   Hallucinations:  No.  visual distortions: rare N/V:  No. Lightheaded:  Yes.    Syncope: No. Diplopia:  Yes.   (told due to cataract)    Outside reports reviewed: historical medical records, radiology reports and referral letter/letters.  No Known Allergies  Outpatient Encounter Prescriptions as of 08/12/2014  Medication Sig  . acitretin (SORIATANE) 10 MG capsule Take 10 mg by mouth daily with supper.  Marland Kitchen amLODipine (NORVASC) 10 MG tablet Take 10 mg by mouth daily.  Marland Kitchen aspirin 81 MG tablet Take 81 mg by mouth daily.  Marland Kitchen  atorvastatin (LIPITOR) 20 MG tablet Take 20 mg by mouth at bedtime.   . bisacodyl (DULCOLAX) 10 MG suppository Place 1 suppository (10 mg total) rectally 2 (two) times daily.  . clobetasol (OLUX) 0.05 % topical foam Apply to scalp daily as needed  . docusate sodium 100 MG CAPS Take 200 mg by mouth 2 (two) times daily.  . feeding supplement, GLUCERNA SHAKE, (GLUCERNA SHAKE) LIQD Take 237 mLs by mouth daily after supper.  . fexofenadine (ALLEGRA) 180 MG tablet Take 180 mg by mouth daily. During allergy seasons.  . folic acid (FOLVITE) 1 MG tablet Take 1 mg by mouth daily.  . furosemide (LASIX) 40 MG tablet TAKE 1 TABLET BY MOUTH DAILY.  Marland Kitchen gabapentin (NEURONTIN) 100 MG capsule Take 2 capsules (200 mg total) by mouth 2 (two) times daily.  . hydrALAZINE (APRESOLINE) 100 MG tablet TAKE 1 TABLET BY MOUTH THREE TIMES DAILY  . HYDROcodone-acetaminophen (NORCO) 7.5-325 MG per tablet Take 1 tablet by mouth every 6 (six) hours as needed for moderate pain.  Marland Kitchen insulin aspart (NOVOLOG) 100 UNIT/ML injection SLIDING SCALE 0-9 Units, Injected subcutaneous, 3 times daily with meals. CBG < 70: implement hypoglycemia protocol CBG 70 - 120: 0 units CBG 121 - 150: 1 unit CBG 151 - 200: 2 units CBG 201 - 250: 3 units CBG 251 - 300: 5 units CBG  301 - 350: 7 units CBG 351 - 400: 9 units CBG > 400: call MD (Patient taking differently: Inject 0-9 Units into the skin 3 (three) times daily with meals. SLIDING SCALE 0-9 Units, Injected subcutaneous, 3 times daily with meals. CBG < 70: implement hypoglycemia protocol CBG 70 - 120: 0 units CBG 121 - 150: 1 unit CBG 151 - 200: 2 units CBG 201 - 250: 3 units CBG 251 - 300: 5 units CBG 301 - 350: 7 units CBG 351 - 400: 9 units CBG > 400: call MD)  . insulin glargine (LANTUS) 100 UNIT/ML injection Inject 26 Units into the skin 2 (two) times daily.  . Linaclotide (LINZESS) 145 MCG CAPS capsule Take 145 mcg by mouth daily as needed.  . methocarbamol (ROBAXIN) 750 MG  tablet Take 1 po QID for muscle pain  . methotrexate (RHEUMATREX) 2.5 MG tablet Take 7.5 mg by mouth every Wednesday. Caution:Chemotherapy. Protect from light. Takes on Wednesday.  . montelukast (SINGULAIR) 10 MG tablet Take 10 mg by mouth daily.  . pantoprazole (PROTONIX) 40 MG tablet Take 40 mg by mouth daily.   . polyethylene glycol (MIRALAX / GLYCOLAX) packet Take 17 g by mouth daily as needed for moderate constipation.  . ranolazine (RANEXA) 500 MG 12 hr tablet Take 1 tablet (500 mg total) by mouth 2 (two) times daily.  Marland Kitchen senna (SENOKOT) 8.6 MG TABS tablet Take 1 tablet (8.6 mg total) by mouth 2 (two) times daily.  Marland Kitchen spironolactone (ALDACTONE) 25 MG tablet Take 1 tablet (25 mg total) by mouth daily.  Marland Kitchen NITROSTAT 0.4 MG SL tablet PLACE 1 TABLET UNDER THE TONGUE EVERY 5 MINUTES AS NEEDED FOR CHEST PAIN. (Patient not taking: Reported on 08/12/2014)    Past Medical History  Diagnosis Date  . Hypertension   . Nephrolithiasis   . GERD (gastroesophageal reflux disease)   . Arthritis   . Psoriasis   . Morbid obesity   . Mitral regurgitation   . Hyperlipidemia   . Sleep apnea     uses cpap  . CHF (congestive heart failure)     Diastolic. EF 55-65% by cath 10/11/11  . Diabetes mellitus     insulin dependent  . Acute renal insufficiency     09/2011. not on ACEI secondary to this (also has renal artery stenosis)  . Peripheral vascular disease     severe right renal artery stenosis and left SFA stenosis by PV angio 09/2011, treated medically  . Cellulitis   . CAD (coronary artery disease)     NSTEMI 09/2011 felt poor candidate for CABG, instead s/p PTCA/DES to mid RCA 10/17/11  . Chronic respiratory failure   . Pulmonary hypertension   . Obstructive sleep apnea   . CKD (chronic kidney disease) stage 2, GFR 60-89 ml/min 07/11/2013    Past Surgical History  Procedure Laterality Date  . Acromio-clavicular joint repair  2011  . Abdominal hysterectomy    . Eye surgery      cataract  OD  .  Lower extremity angiogram N/A 10/11/2011    Procedure: LOWER EXTREMITY ANGIOGRAM;  Surgeon: Sherren Mocha, MD;  Location: [facility] CATH LAB;  Service: Cardiovascular;  Laterality: N/A;  . Left and right heart catheterization with coronary angiogram N/A 10/11/2011    Procedure: LEFT AND RIGHT HEART CATHETERIZATION WITH CORONARY ANGIOGRAM;  Surgeon: Sherren Mocha, MD;  Location: [facility] CATH LAB;  Service: Cardiovascular;  Laterality: N/A;  . Percutaneous coronary stent intervention (pci-s) N/A 10/17/2011    Procedure: PERCUTANEOUS CORONARY  STENT INTERVENTION (PCI-S);  Surgeon: Burnell Blanks, MD;  Location: [facility] CATH LAB;  Service: Cardiovascular;  Laterality: N/A;    History   Social History  . Marital Status: Divorced    Spouse Name: N/A    Number of Children: N/A  . Years of Education: N/A   Occupational History  . Not on file.   Social History Main Topics  . Smoking status: Never Smoker   . Smokeless tobacco: Never Used  . Alcohol Use: No  . Drug Use: No  . Sexual Activity: No   Other Topics Concern  . Not on file   Social History Narrative    Family Status  Relation Status Death Age  . Mother Deceased     Cancer  . Father Deceased     Cancer  . Brother Alive   . Brother Alive   . Brother Alive   . Brother Deceased     accident (horse)  . Sister Alive   . Sister Alive   . Sister Alive   . Sister Alive   . Sister Alive   . Sister Alive   . Sister Alive   . Sister Alive   . Sister Alive   . Sister Deceased     unknown  . Son Alive   . Son Alive   . Son Alive   . Daughter Alive     Review of Systems A complete 10 system ROS was obtained and was negative apart from what is mentioned.   Objective:   VITALS:   Filed Vitals:   08/12/14 1421  BP: 120/62  Pulse: 76   Gen:  Appears stated age and in NAD. HEENT:  Normocephalic, atraumatic. The mucous membranes are moist. The superficial temporal arteries are without ropiness or tenderness. Cardiovascular:  Regular rate and rhythm with 3/6 SEM. Lungs: Clear to auscultation bilaterally.  Wearing O2.  Some conversational dyspnea. Neck: There are no carotid bruits noted bilaterally.  NEUROLOGICAL:  Orientation:  The patient is alert and oriented x 3.  Recent and remote memory are intact.  Attention span and concentration are normal.  Able to name objects and repeat without trouble.  Fund of knowledge is appropriate Cranial nerves: There is good facial symmetry. The pupils are equal round and reactive to light bilaterally. Fundoscopic exam reveals clear disc margins bilaterally. Extraocular muscles are intact and visual fields are full to confrontational testing.  Significant L horizontal nystagmus.  Speech is fluent and clear. Soft palate rises symmetrically and there is no tongue deviation. Hearing is intact to conversational tone. Tone: Tone is good throughout. Sensation: Sensation is intact to light touch and pinprick throughout (facial, trunk, extremities). Vibration is decreased distally. There is no extinction with double simultaneous stimulation. There is no sensory dermatomal level identified. Coordination:  The patient has no dysdiadichokinesia or dysmetria. Motor: Strength is 4/5 in the bilateral upper and lower extremities.  Shoulder shrug is equal bilaterally.  There is no pronator drift.  There are no fasciculations noted. DTR's: Deep tendon reflexes are 0/4 at the bilateral biceps, triceps, brachioradialis, patella and achilles.  Plantar responses are downgoing bilaterally. Gait and Station: The patient requires a walker to ambulate and then she is very slow.  Endurance is very poor.  Doesn't shuffle but is short stepped.  MOVEMENT EXAM: Tremor:  There is a tremor, L greater than R, with distraction procedures.  It is a rest tremor.  Labs:  Lab Results  Component Value Date   TSH 2.120 07/09/2014  Chemistry      Component Value Date/Time   NA 136* 07/14/2014 0025   K 4.0  07/14/2014 0025   CL 96 07/14/2014 0025   CO2 27 07/14/2014 0025   BUN 15 07/14/2014 0025   CREATININE 0.98 07/14/2014 0025      Component Value Date/Time   CALCIUM 11.2* 07/14/2014 0025   ALKPHOS 59 07/12/2014 0357   AST 18 07/12/2014 0357   ALT 18 07/12/2014 0357   BILITOT 0.7 07/12/2014 0357     Lab Results  Component Value Date   WBC 9.1 07/14/2014   HGB 11.2* 07/14/2014   HCT 36.2 07/14/2014   MCV 81.5 07/14/2014   PLT 248 07/14/2014        Assessment/Plan:   1.  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  She doesn't meet criteria for PD.  She does have a rest tremor when distracted.  i would not treat this with medication and pt completely agrees.  I would, however, like to watch her periodically to make sure that she doesn't develop PD and will schedule a f/u in 5 months but told her/son to call me if something new develops or worsens.  -She does have rather significant nystagmus.  It is horizontal and usually that is not as serious as vertical but would like to do MRI brain.  Told her that I expect to see atrophy and small vessel disease but want to make sure that nothing else going on.  Reviewed recent CT and just showed atrophy.

## 2014-08-25 ENCOUNTER — Ambulatory Visit: Payer: Medicare Other | Admitting: Cardiovascular Disease

## 2014-08-27 ENCOUNTER — Telehealth: Payer: Self-pay | Admitting: Neurology

## 2014-08-27 ENCOUNTER — Ambulatory Visit
Admission: RE | Admit: 2014-08-27 | Discharge: 2014-08-27 | Disposition: A | Discharge status: Home / Self Care | Payer: Medicare Other | Source: Ambulatory Visit | Attending: Neurology | Admitting: Neurology

## 2014-08-27 DIAGNOSIS — G20 Parkinson's disease: Secondary | ICD-10-CM

## 2014-08-27 DIAGNOSIS — H5500 Unspecified nystagmus: Secondary | ICD-10-CM

## 2014-08-27 DIAGNOSIS — W19XXXA Unspecified fall, initial encounter: Secondary | ICD-10-CM

## 2014-08-27 MED ORDER — GADOBENATE DIMEGLUMINE 529 MG/ML IV SOLN: 20.0000 mL | Freq: Once | INTRAVENOUS | Status: AC | PRN

## 2014-08-27 NOTE — Telephone Encounter (Signed)
-----   Message from [employer], DO sent at 08/27/2014  1:04 PM EST ----- Reviewed.  Mild to mod atrophy and mild to mod small vessel disease.  Jathan Balling, let pt know that nothing unexpected on MRI and see them in 5 months at f/u

## 2014-08-27 NOTE — Telephone Encounter (Signed)
Left message on machine for patient to call back.

## 2014-08-28 ENCOUNTER — Telehealth: Payer: Self-pay | Admitting: Cardiology

## 2014-08-28 ENCOUNTER — Encounter (HOSPITAL_COMMUNITY): Payer: Self-pay | Admitting: *Deleted

## 2014-08-28 ENCOUNTER — Inpatient Hospital Stay (HOSPITAL_COMMUNITY)
Admission: EM | Admit: 2014-08-28 | Discharge: 2014-08-30 | DRG: 291 | Disposition: A | Discharge status: Home / Self Care | Payer: Medicare Other | Attending: Family Medicine | Admitting: Family Medicine

## 2014-08-28 ENCOUNTER — Emergency Department (HOSPITAL_COMMUNITY): Payer: Medicare Other

## 2014-08-28 DIAGNOSIS — Z79899 Other long term (current) drug therapy: Secondary | ICD-10-CM | POA: Diagnosis not present

## 2014-08-28 DIAGNOSIS — I2781 Cor pulmonale (chronic): Secondary | ICD-10-CM | POA: Diagnosis present

## 2014-08-28 DIAGNOSIS — M1990 Unspecified osteoarthritis, unspecified site: Secondary | ICD-10-CM | POA: Diagnosis present

## 2014-08-28 DIAGNOSIS — I739 Peripheral vascular disease, unspecified: Secondary | ICD-10-CM | POA: Diagnosis present

## 2014-08-28 DIAGNOSIS — N183 Chronic kidney disease, stage 3 (moderate): Secondary | ICD-10-CM

## 2014-08-28 DIAGNOSIS — J9601 Acute respiratory failure with hypoxia: Secondary | ICD-10-CM | POA: Diagnosis present

## 2014-08-28 DIAGNOSIS — I350 Nonrheumatic aortic (valve) stenosis: Secondary | ICD-10-CM | POA: Diagnosis present

## 2014-08-28 DIAGNOSIS — E1165 Type 2 diabetes mellitus with hyperglycemia: Secondary | ICD-10-CM | POA: Diagnosis present

## 2014-08-28 DIAGNOSIS — E669 Obesity, unspecified: Secondary | ICD-10-CM | POA: Diagnosis present

## 2014-08-28 DIAGNOSIS — Z794 Long term (current) use of insulin: Secondary | ICD-10-CM | POA: Diagnosis not present

## 2014-08-28 DIAGNOSIS — M069 Rheumatoid arthritis, unspecified: Secondary | ICD-10-CM | POA: Diagnosis present

## 2014-08-28 DIAGNOSIS — I2510 Atherosclerotic heart disease of native coronary artery without angina pectoris: Secondary | ICD-10-CM | POA: Diagnosis present

## 2014-08-28 DIAGNOSIS — I252 Old myocardial infarction: Secondary | ICD-10-CM | POA: Diagnosis not present

## 2014-08-28 DIAGNOSIS — K5900 Constipation, unspecified: Secondary | ICD-10-CM | POA: Diagnosis present

## 2014-08-28 DIAGNOSIS — Z9861 Coronary angioplasty status: Secondary | ICD-10-CM

## 2014-08-28 DIAGNOSIS — N179 Acute kidney failure, unspecified: Secondary | ICD-10-CM | POA: Diagnosis present

## 2014-08-28 DIAGNOSIS — J449 Chronic obstructive pulmonary disease, unspecified: Secondary | ICD-10-CM | POA: Diagnosis present

## 2014-08-28 DIAGNOSIS — I129 Hypertensive chronic kidney disease with stage 1 through stage 4 chronic kidney disease, or unspecified chronic kidney disease: Secondary | ICD-10-CM | POA: Diagnosis present

## 2014-08-28 DIAGNOSIS — E1140 Type 2 diabetes mellitus with diabetic neuropathy, unspecified: Secondary | ICD-10-CM | POA: Diagnosis present

## 2014-08-28 DIAGNOSIS — I10 Essential (primary) hypertension: Secondary | ICD-10-CM | POA: Diagnosis present

## 2014-08-28 DIAGNOSIS — Z79891 Long term (current) use of opiate analgesic: Secondary | ICD-10-CM

## 2014-08-28 DIAGNOSIS — I272 Other secondary pulmonary hypertension: Secondary | ICD-10-CM | POA: Diagnosis present

## 2014-08-28 DIAGNOSIS — Z9981 Dependence on supplemental oxygen: Secondary | ICD-10-CM | POA: Diagnosis not present

## 2014-08-28 DIAGNOSIS — H5500 Unspecified nystagmus: Secondary | ICD-10-CM | POA: Diagnosis present

## 2014-08-28 DIAGNOSIS — N182 Chronic kidney disease, stage 2 (mild): Secondary | ICD-10-CM | POA: Diagnosis present

## 2014-08-28 DIAGNOSIS — E785 Hyperlipidemia, unspecified: Secondary | ICD-10-CM | POA: Diagnosis present

## 2014-08-28 DIAGNOSIS — N189 Chronic kidney disease, unspecified: Secondary | ICD-10-CM | POA: Diagnosis present

## 2014-08-28 DIAGNOSIS — R0600 Dyspnea, unspecified: Secondary | ICD-10-CM

## 2014-08-28 DIAGNOSIS — G252 Other specified forms of tremor: Secondary | ICD-10-CM | POA: Diagnosis present

## 2014-08-28 DIAGNOSIS — G4733 Obstructive sleep apnea (adult) (pediatric): Secondary | ICD-10-CM | POA: Diagnosis present

## 2014-08-28 DIAGNOSIS — K219 Gastro-esophageal reflux disease without esophagitis: Secondary | ICD-10-CM | POA: Diagnosis present

## 2014-08-28 DIAGNOSIS — I5033 Acute on chronic diastolic (congestive) heart failure: Principal | ICD-10-CM | POA: Diagnosis present

## 2014-08-28 DIAGNOSIS — E1121 Type 2 diabetes mellitus with diabetic nephropathy: Secondary | ICD-10-CM

## 2014-08-28 DIAGNOSIS — R531 Weakness: Secondary | ICD-10-CM

## 2014-08-28 DIAGNOSIS — I5031 Acute diastolic (congestive) heart failure: Secondary | ICD-10-CM

## 2014-08-28 DIAGNOSIS — E119 Type 2 diabetes mellitus without complications: Secondary | ICD-10-CM

## 2014-08-28 DIAGNOSIS — D72829 Elevated white blood cell count, unspecified: Secondary | ICD-10-CM | POA: Diagnosis present

## 2014-08-28 DIAGNOSIS — Z7982 Long term (current) use of aspirin: Secondary | ICD-10-CM

## 2014-08-28 DIAGNOSIS — I509 Heart failure, unspecified: Secondary | ICD-10-CM

## 2014-08-28 HISTORY — DX: Vitamin D deficiency, unspecified: E55.9

## 2014-08-28 LAB — COMPREHENSIVE METABOLIC PANEL
ALT: 28 U/L (ref 0–35)
AST: 17 U/L (ref 0–37)
Albumin: 4.4 g/dL (ref 3.5–5.2)
Alkaline Phosphatase: 67 U/L (ref 39–117)
Anion gap: 9 (ref 5–15)
BILIRUBIN TOTAL: 1 mg/dL (ref 0.3–1.2)
BUN: 39 mg/dL — AB (ref 6–23)
CO2: 24 mmol/L (ref 19–32)
Calcium: 10.7 mg/dL — ABNORMAL HIGH (ref 8.4–10.5)
Chloride: 103 mEq/L (ref 96–112)
Creatinine, Ser: 1.31 mg/dL — ABNORMAL HIGH (ref 0.50–1.10)
GFR calc non Af Amer: 37 mL/min — ABNORMAL LOW (ref >90)
GFR, EST AFRICAN AMERICAN: 42 mL/min — AB (ref >90)
Glucose, Bld: 175 mg/dL — ABNORMAL HIGH (ref 70–99)
Potassium: 4.5 mmol/L (ref 3.5–5.1)
SODIUM: 136 mmol/L (ref 135–145)
Total Protein: 8.1 g/dL (ref 6.0–8.3)

## 2014-08-28 LAB — GLUCOSE, CAPILLARY: Glucose-Capillary: 168 mg/dL — ABNORMAL HIGH (ref 70–99)

## 2014-08-28 LAB — CBC WITH DIFFERENTIAL/PLATELET
BASOS ABS: 0 10*3/uL (ref 0.0–0.1)
Basophils Relative: 0 % (ref 0–1)
EOS PCT: 0 % (ref 0–5)
Eosinophils Absolute: 0.1 10*3/uL (ref 0.0–0.7)
HEMATOCRIT: 38.2 % (ref 36.0–46.0)
Hemoglobin: 12 g/dL (ref 12.0–15.0)
Lymphocytes Relative: 6 % — ABNORMAL LOW (ref 12–46)
Lymphs Abs: 0.9 10*3/uL (ref 0.7–4.0)
MCH: 26.2 pg (ref 26.0–34.0)
MCHC: 31.4 g/dL (ref 30.0–36.0)
MCV: 83.4 fL (ref 78.0–100.0)
Monocytes Absolute: 0.9 10*3/uL (ref 0.1–1.0)
Monocytes Relative: 6 % (ref 3–12)
Neutro Abs: 13.9 10*3/uL — ABNORMAL HIGH (ref 1.7–7.7)
Neutrophils Relative %: 88 % — ABNORMAL HIGH (ref 43–77)
Platelets: 238 10*3/uL (ref 150–400)
RBC: 4.58 MIL/uL (ref 3.87–5.11)
RDW: 19.6 % — AB (ref 11.5–15.5)
WBC: 15.8 10*3/uL — ABNORMAL HIGH (ref 4.0–10.5)

## 2014-08-28 LAB — CREATININE, SERUM
Creatinine, Ser: 1.28 mg/dL — ABNORMAL HIGH (ref 0.50–1.10)
GFR calc Af Amer: 44 mL/min — ABNORMAL LOW (ref >90)
GFR calc non Af Amer: 38 mL/min — ABNORMAL LOW (ref >90)

## 2014-08-28 LAB — CBC
HCT: 34.9 % — ABNORMAL LOW (ref 36.0–46.0)
HEMOGLOBIN: 10.9 g/dL — AB (ref 12.0–15.0)
MCH: 25.8 pg — ABNORMAL LOW (ref 26.0–34.0)
MCHC: 31.2 g/dL (ref 30.0–36.0)
MCV: 82.5 fL (ref 78.0–100.0)
Platelets: 247 10*3/uL (ref 150–400)
RBC: 4.23 MIL/uL (ref 3.87–5.11)
RDW: 19.6 % — ABNORMAL HIGH (ref 11.5–15.5)
WBC: 15.6 10*3/uL — ABNORMAL HIGH (ref 4.0–10.5)

## 2014-08-28 LAB — TROPONIN I
TROPONIN I: 0.03 ng/mL (ref <0.031)
Troponin I: 0.03 ng/mL (ref <0.031)

## 2014-08-28 LAB — BRAIN NATRIURETIC PEPTIDE: B Natriuretic Peptide: 1440 pg/mL — ABNORMAL HIGH (ref 0.0–100.0)

## 2014-08-28 MED ORDER — FOLIC ACID 1 MG PO TABS
1.0000 mg | ORAL_TABLET | Freq: Every day | ORAL | Status: DC
  Administered 2014-08-29 – 2014-08-30 (×2): 1 mg via ORAL
  Filled 2014-08-28 (×2): qty 1

## 2014-08-28 MED ORDER — MONTELUKAST SODIUM 10 MG PO TABS
10.0000 mg | ORAL_TABLET | Freq: Every day | ORAL | Status: DC
  Administered 2014-08-29 – 2014-08-30 (×3): 10 mg via ORAL
  Filled 2014-08-28 (×3): qty 1

## 2014-08-28 MED ORDER — ONDANSETRON HCL 4 MG/2ML IJ SOLN: 4.0000 mg | Freq: Four times a day (QID) | INTRAMUSCULAR | Status: DC | PRN

## 2014-08-28 MED ORDER — METHOCARBAMOL 500 MG PO TABS: 750.0000 mg | ORAL_TABLET | Freq: Three times a day (TID) | ORAL | Status: DC | PRN

## 2014-08-28 MED ORDER — LORATADINE 10 MG PO TABS
10.0000 mg | ORAL_TABLET | Freq: Every day | ORAL | Status: DC
  Administered 2014-08-29 – 2014-08-30 (×3): 10 mg via ORAL
  Filled 2014-08-28 (×3): qty 1

## 2014-08-28 MED ORDER — HYDRALAZINE HCL 25 MG PO TABS
100.0000 mg | ORAL_TABLET | Freq: Three times a day (TID) | ORAL | Status: DC
  Administered 2014-08-29 – 2014-08-30 (×6): 100 mg via ORAL
  Filled 2014-08-28 (×6): qty 4

## 2014-08-28 MED ORDER — DOCUSATE SODIUM 100 MG PO CAPS
200.0000 mg | ORAL_CAPSULE | Freq: Two times a day (BID) | ORAL | Status: DC
  Administered 2014-08-29 (×2): 200 mg via ORAL
  Filled 2014-08-28 (×3): qty 2

## 2014-08-28 MED ORDER — BISACODYL 10 MG RE SUPP
10.0000 mg | Freq: Two times a day (BID) | RECTAL | Status: DC
  Administered 2014-08-29 (×2): 10 mg via RECTAL
  Filled 2014-08-28 (×3): qty 1

## 2014-08-28 MED ORDER — INSULIN GLARGINE 100 UNIT/ML ~~LOC~~ SOLN
26.0000 [IU] | Freq: Two times a day (BID) | SUBCUTANEOUS | Status: DC
  Administered 2014-08-29 – 2014-08-30 (×4): 26 [IU] via SUBCUTANEOUS
  Filled 2014-08-28 (×9): qty 0.26

## 2014-08-28 MED ORDER — SODIUM CHLORIDE 0.9 % IJ SOLN
3.0000 mL | Freq: Two times a day (BID) | INTRAMUSCULAR | Status: DC
Start: 2014-08-28 — End: 2014-08-30
  Administered 2014-08-29 – 2014-08-30 (×4): 3 mL via INTRAVENOUS

## 2014-08-28 MED ORDER — INSULIN ASPART 100 UNIT/ML ~~LOC~~ SOLN
0.0000 [IU] | Freq: Three times a day (TID) | SUBCUTANEOUS | Status: DC
  Administered 2014-08-29 – 2014-08-30 (×2): 5 [IU] via SUBCUTANEOUS
  Administered 2014-08-30: 3 [IU] via SUBCUTANEOUS

## 2014-08-28 MED ORDER — INSULIN ASPART 100 UNIT/ML ~~LOC~~ SOLN
0.0000 [IU] | Freq: Every day | SUBCUTANEOUS | Status: DC
Start: 2014-08-28 — End: 2014-08-30
  Administered 2014-08-29: 3 [IU] via SUBCUTANEOUS

## 2014-08-28 MED ORDER — ENOXAPARIN SODIUM 30 MG/0.3ML ~~LOC~~ SOLN
30.0000 mg | SUBCUTANEOUS | Status: DC
  Administered 2014-08-29: 30 mg via SUBCUTANEOUS
  Filled 2014-08-28: qty 0.3

## 2014-08-28 MED ORDER — HYDROCODONE-ACETAMINOPHEN 7.5-325 MG PO TABS
1.0000 | ORAL_TABLET | Freq: Four times a day (QID) | ORAL | Status: DC | PRN
  Filled 2014-08-28: qty 1

## 2014-08-28 MED ORDER — ATORVASTATIN CALCIUM 20 MG PO TABS
20.0000 mg | ORAL_TABLET | Freq: Every day | ORAL | Status: DC
  Administered 2014-08-29 (×2): 20 mg via ORAL
  Filled 2014-08-28 (×2): qty 1

## 2014-08-28 MED ORDER — AMLODIPINE BESYLATE 5 MG PO TABS
10.0000 mg | ORAL_TABLET | Freq: Every day | ORAL | Status: DC
  Administered 2014-08-29 – 2014-08-30 (×2): 10 mg via ORAL
  Filled 2014-08-28 (×2): qty 2

## 2014-08-28 MED ORDER — ONDANSETRON HCL 4 MG PO TABS
4.0000 mg | ORAL_TABLET | Freq: Four times a day (QID) | ORAL | Status: DC | PRN
  Filled 2014-08-28: qty 1

## 2014-08-28 MED ORDER — SENNA 8.6 MG PO TABS
1.0000 | ORAL_TABLET | Freq: Two times a day (BID) | ORAL | Status: DC
  Administered 2014-08-29 (×2): 8.6 mg via ORAL
  Filled 2014-08-28 (×3): qty 1

## 2014-08-28 MED ORDER — FUROSEMIDE 10 MG/ML IJ SOLN
40.0000 mg | Freq: Once | INTRAMUSCULAR | Status: AC
  Administered 2014-08-28: 40 mg via INTRAVENOUS
  Filled 2014-08-28: qty 4

## 2014-08-28 MED ORDER — ACITRETIN 10 MG PO CAPS
10.0000 mg | ORAL_CAPSULE | Freq: Every day | ORAL | Status: DC
  Filled 2014-08-28 (×2): qty 1

## 2014-08-28 MED ORDER — POLYETHYLENE GLYCOL 3350 17 G PO PACK: 17.0000 g | PACK | Freq: Every day | ORAL | Status: DC | PRN

## 2014-08-28 MED ORDER — GLUCERNA SHAKE PO LIQD
237.0000 mL | Freq: Every day | ORAL | Status: DC
  Administered 2014-08-29 – 2014-08-30 (×2): 237 mL via ORAL

## 2014-08-28 MED ORDER — FUROSEMIDE 10 MG/ML IJ SOLN
80.0000 mg | Freq: Two times a day (BID) | INTRAMUSCULAR | Status: DC
  Administered 2014-08-29 (×2): 80 mg via INTRAVENOUS
  Filled 2014-08-28 (×2): qty 8

## 2014-08-28 MED ORDER — PANTOPRAZOLE SODIUM 40 MG PO TBEC
40.0000 mg | DELAYED_RELEASE_TABLET | Freq: Every day | ORAL | Status: DC
  Administered 2014-08-29 – 2014-08-30 (×2): 40 mg via ORAL
  Filled 2014-08-28 (×2): qty 1

## 2014-08-28 MED ORDER — ASPIRIN 81 MG PO TABS
81.0000 mg | ORAL_TABLET | Freq: Every day | ORAL | Status: DC
  Filled 2014-08-28 (×5): qty 1

## 2014-08-28 MED ORDER — SPIRONOLACTONE 25 MG PO TABS
25.0000 mg | ORAL_TABLET | Freq: Every day | ORAL | Status: DC
  Administered 2014-08-29 – 2014-08-30 (×2): 25 mg via ORAL
  Filled 2014-08-28 (×2): qty 1

## 2014-08-28 MED ORDER — GABAPENTIN 100 MG PO CAPS
200.0000 mg | ORAL_CAPSULE | Freq: Two times a day (BID) | ORAL | Status: DC
  Administered 2014-08-29 – 2014-08-30 (×4): 200 mg via ORAL
  Filled 2014-08-28 (×4): qty 2

## 2014-08-28 MED ORDER — NITROGLYCERIN 0.4 MG SL SUBL: 0.4000 mg | SUBLINGUAL_TABLET | SUBLINGUAL | Status: DC | PRN

## 2014-08-28 MED ORDER — RANOLAZINE ER 500 MG PO TB12
500.0000 mg | ORAL_TABLET | Freq: Two times a day (BID) | ORAL | Status: DC
  Administered 2014-08-29 – 2014-08-30 (×4): 500 mg via ORAL
  Filled 2014-08-28 (×4): qty 1

## 2014-08-28 NOTE — ED Notes (Signed)
BP taken at home by OT at home was 200/67.  Recommended she come to ER.  She has had generalized HA all day. Denies vision change, n/v, dizziness.  She has taken her BP meds today.

## 2014-08-28 NOTE — Telephone Encounter (Signed)
Pt with elevated BP, and resp of 37, and lung congestion, to take to ER.

## 2014-08-28 NOTE — ED Notes (Signed)
Pt. Reports her blood pressure was elevated at home. Pt. C/o generalized weakness. Pt. Reports diarrhea and abdominal cramps. No distress noted.

## 2014-08-28 NOTE — H&P (Signed)
[facility] History and Physical  Theresa H Dullea GNF:[id number] DOB: Feb 05, 1929 DOA: 08/28/2014  Referring physician: ER PCP: Glenda Chroman., MD   Chief Complaint: Dyspnea.  HPI: Theresa Mclean is a 80 y.o. female  This is an 80 year old lady who has a history of coronary artery disease and a history of heart failure presents with a 12 hour history of weakness associated with dyspnea on very minimal exertion. She denies any chest pain. She relates that she has been feeling weak for little bit longer, perhaps a few more days and her legs have been swelling. She has been compliant with all her medications. She denies any palpitations or limb weakness specifically. Her last echocardiogram, done in November of this year, shows an ejection fraction of 60-65% with mild aortic stenosis. There was grade 1 diastolic dysfunction present. She is diabetic, hypertensive and obese. She is a nonsmoker. Evaluation in the emergency room shows a chest x-ray which possibly could show some pulmonary vascular congestion. She is now being admitted for further management.   Review of Systems:  Apart from symptoms above, all other systems negative.  Past Medical History  Diagnosis Date  . Hypertension   . Nephrolithiasis   . GERD (gastroesophageal reflux disease)   . Arthritis   . Psoriasis   . Morbid obesity   . Mitral regurgitation   . Hyperlipidemia   . Sleep apnea     uses cpap  . CHF (congestive heart failure)     Diastolic. EF 55-65% by cath 10/11/11  . Diabetes mellitus     insulin dependent  . Acute renal insufficiency     09/2011. not on ACEI secondary to this (also has renal artery stenosis)  . Peripheral vascular disease     severe right renal artery stenosis and left SFA stenosis by PV angio 09/2011, treated medically  . Cellulitis   . CAD (coronary artery disease)     NSTEMI 09/2011 felt poor candidate for CABG, instead s/p PTCA/DES to mid RCA 10/17/11  . Chronic respiratory failure     . Pulmonary hypertension   . Obstructive sleep apnea   . CKD (chronic kidney disease) stage 2, GFR 60-89 ml/min 07/11/2013  . Vitamin D deficiency    Past Surgical History  Procedure Laterality Date  . Acromio-clavicular joint repair  2011  . Abdominal hysterectomy    . Eye surgery      cataract  OD  . Lower extremity angiogram N/A 10/11/2011    Procedure: LOWER EXTREMITY ANGIOGRAM;  Surgeon: Sherren Mocha, MD;  Location: [facility] CATH LAB;  Service: Cardiovascular;  Laterality: N/A;  . Left and right heart catheterization with coronary angiogram N/A 10/11/2011    Procedure: LEFT AND RIGHT HEART CATHETERIZATION WITH CORONARY ANGIOGRAM;  Surgeon: Sherren Mocha, MD;  Location: [facility] CATH LAB;  Service: Cardiovascular;  Laterality: N/A;  . Percutaneous coronary stent intervention (pci-s) N/A 10/17/2011    Procedure: PERCUTANEOUS CORONARY STENT INTERVENTION (PCI-S);  Surgeon: Burnell Blanks, MD;  Location: [facility] CATH LAB;  Service: Cardiovascular;  Laterality: N/A;   Social History:  reports that she has never smoked. She has never used smokeless tobacco. She reports that she does not drink alcohol or use illicit drugs.  No Known Allergies  Family History  Problem Relation Age of Onset  . Psoriasis Brother      Prior to Admission medications   Medication Sig Start Date End Date Taking? Authorizing Provider  acitretin (SORIATANE) 10 MG capsule Take 10 mg by mouth daily with supper.  08/01/13   Historical Provider, MD  amLODipine (NORVASC) 10 MG tablet Take 10 mg by mouth daily.    Historical Provider, MD  aspirin 81 MG tablet Take 81 mg by mouth daily.    Historical Provider, MD  atorvastatin (LIPITOR) 20 MG tablet Take 20 mg by mouth at bedtime.     Historical Provider, MD  bisacodyl (DULCOLAX) 10 MG suppository Place 1 suppository (10 mg total) rectally 2 (two) times daily. 07/14/14   Geradine Girt, DO  clobetasol (OLUX) 0.05 % topical foam Apply to scalp daily as needed 04/24/13    Historical Provider, MD  docusate sodium 100 MG CAPS Take 200 mg by mouth 2 (two) times daily. 07/14/14   Geradine Girt, DO  feeding supplement, GLUCERNA SHAKE, (GLUCERNA SHAKE) LIQD Take 237 mLs by mouth daily after supper. 07/14/14   Geradine Girt, DO  fexofenadine (ALLEGRA) 180 MG tablet Take 180 mg by mouth daily. During allergy seasons.    Historical Provider, MD  folic acid (FOLVITE) 1 MG tablet Take 1 mg by mouth daily.    Historical Provider, MD  furosemide (LASIX) 40 MG tablet TAKE 1 TABLET BY MOUTH DAILY. 03/10/14   Herminio Commons, MD  gabapentin (NEURONTIN) 100 MG capsule Take 2 capsules (200 mg total) by mouth 2 (two) times daily. 07/14/14   Geradine Girt, DO  hydrALAZINE (APRESOLINE) 100 MG tablet TAKE 1 TABLET BY MOUTH THREE TIMES DAILY 08/11/14   Herminio Commons, MD  HYDROcodone-acetaminophen (NORCO) 7.5-325 MG per tablet Take 1 tablet by mouth every 6 (six) hours as needed for moderate pain.    Historical Provider, MD  insulin aspart (NOVOLOG) 100 UNIT/ML injection SLIDING SCALE 0-9 Units, Injected subcutaneous, 3 times daily with meals. CBG < 70: implement hypoglycemia protocol CBG 70 - 120: 0 units CBG 121 - 150: 1 unit CBG 151 - 200: 2 units CBG 201 - 250: 3 units CBG 251 - 300: 5 units CBG 301 - 350: 7 units CBG 351 - 400: 9 units CBG > 400: call MD Patient taking differently: Inject 0-9 Units into the skin 3 (three) times daily with meals. SLIDING SCALE 0-9 Units, Injected subcutaneous, 3 times daily with meals. CBG < 70: implement hypoglycemia protocol CBG 70 - 120: 0 units CBG 121 - 150: 1 unit CBG 151 - 200: 2 units CBG 201 - 250: 3 units CBG 251 - 300: 5 units CBG 301 - 350: 7 units CBG 351 - 400: 9 units CBG > 400: call MD 10/20/11   [name], PA-C  insulin glargine (LANTUS) 100 UNIT/ML injection Inject 26 Units into the skin 2 (two) times daily.    Historical Provider, MD  Linaclotide Rolan Lipa) 145 MCG CAPS capsule Take 145 mcg by mouth daily as  needed.    Historical Provider, MD  methocarbamol (ROBAXIN) 750 MG tablet Take 1 po QID for muscle pain 11/24/13   [name], MD  methotrexate (RHEUMATREX) 2.5 MG tablet Take 7.5 mg by mouth every Wednesday. Caution:Chemotherapy. Protect from light. Takes on Wednesday.    Historical Provider, MD  montelukast (SINGULAIR) 10 MG tablet Take 10 mg by mouth daily.    Historical Provider, MD  NITROSTAT 0.4 MG SL tablet PLACE 1 TABLET UNDER THE TONGUE EVERY 5 MINUTES AS NEEDED FOR CHEST PAIN. Patient not taking: Reported on 08/12/2014 08/05/14   Herminio Commons, MD  pantoprazole (PROTONIX) 40 MG tablet Take 40 mg by mouth daily.     Historical Provider, MD  polyethylene glycol (MIRALAX / GLYCOLAX) packet Take 17 g by mouth daily as needed for moderate constipation. 07/12/13   Rexene Alberts, MD  ranolazine (RANEXA) 500 MG 12 hr tablet Take 1 tablet (500 mg total) by mouth 2 (two) times daily. 05/26/14   Herminio Commons, MD  senna (SENOKOT) 8.6 MG TABS tablet Take 1 tablet (8.6 mg total) by mouth 2 (two) times daily. 07/14/14   Geradine Girt, DO  spironolactone (ALDACTONE) 25 MG tablet Take 1 tablet (25 mg total) by mouth daily. 08/16/13   Kathie Dike, MD   Physical Exam: Filed Vitals:   08/28/14 1833 08/28/14 1900 08/28/14 1930  BP: 168/34 173/63 177/54  Pulse: 75 73 70  Temp: 98.6 F (37 C)    TempSrc: Oral    Resp: 36 30 23  Height: 5\' 6"  (1.676 m)    Weight: 90.719 kg (200 lb)    SpO2: 100% 95% 95%    Wt Readings from Last 3 Encounters:  08/28/14 90.719 kg (200 lb)  07/31/14 94.802 kg (209 lb)  07/13/14 93.622 kg (206 lb 6.4 oz)    General:  Appears weak at rest and appears to be dyspneic on very minimal exertion. Eyes: PERRL, normal lids, irises & conjunctiva ENT: grossly normal hearing, lips & tongue Neck: no LAD, masses or thyromegaly Cardiovascular: Heart sounds are present without any murmurs. There is no gallop rhythm. Jugular venous pressure does not appear to be  elevated. There is peripheral pitting edema in her lower legs. Telemetry: SR, no arrhythmias  Respiratory: Inspiratory crackles in both bases. There is no bronchial breathing or wheezing. Abdomen: soft, ntnd Skin: no rash or induration seen on limited exam Musculoskeletal: grossly normal tone BUE/BLE Psychiatric: grossly normal mood and affect, speech fluent and appropriate Neurologic: grossly non-focal.          Labs on Admission:  Basic Metabolic Panel:  Recent Labs Lab 08/28/14 1944  NA 136  K 4.5  CL 103  CO2 24  GLUCOSE 175*  BUN 39*  CREATININE 1.31*  CALCIUM 10.7*   Liver Function Tests:  Recent Labs Lab 08/28/14 1944  AST 17  ALT 28  ALKPHOS 67  BILITOT 1.0  PROT 8.1  ALBUMIN 4.4   No results for input(s): LIPASE, AMYLASE in the last 168 hours. No results for input(s): AMMONIA in the last 168 hours. CBC:  Recent Labs Lab 08/28/14 1932  WBC 15.8*  NEUTROABS 13.9*  HGB 12.0  HCT 38.2  MCV 83.4  PLT 238   Cardiac Enzymes:  Recent Labs Lab 08/28/14 1932  TROPONINI 0.03    BNP (last 3 results) No results for input(s): PROBNP in the last 8760 hours. CBG: No results for input(s): GLUCAP in the last 168 hours.  Radiological Exams on Admission: Dg Chest 2 View  08/28/2014   CLINICAL DATA:  Dyspnea.  EXAM: CHEST  2 VIEW  COMPARISON:  July 11, 2014.  FINDINGS: Stable cardiomegaly. No pneumothorax or pleural effusion is noted. Stable interstitial densities are noted in both lung bases most consistent with scarring, but superimposed edema cannot be excluded. Bony thorax is intact.  IMPRESSION: Stable cardiomegaly. Stable bibasilar interstitial densities most consistent with scarring, but superimposed pulmonary edema cannot be excluded.   Electronically Signed   By: Sabino Dick M.D.   On: 08/28/2014 20:16   Mr Jeri Cos IP Contrast  08/27/2014   CLINICAL DATA:  Tremors. Weakness in the upper and lower extremities. Unable to walk. Nystagmus.  EXAM:  MRI HEAD  WITHOUT AND WITH CONTRAST  TECHNIQUE: Multiplanar, multiecho pulse sequences of the brain and surrounding structures were obtained without and with intravenous contrast.  CONTRAST:  20 mL MultiHance  COMPARISON:  CT head without contrast 07/11/2014.  FINDINGS: No acute infarct, hemorrhage, or mass lesion is present. Moderate periventricular and subcortical white matter changes are noted bilaterally. Mild to moderate atrophy is present as well.  The ventricles are proportionate to the degree of atrophy. White matter changes extend into the brainstem. No significant extraaxial fluid collection is present.  Flow is present in the major intracranial arteries.  A right lens replacement is noted. The globes and orbits are otherwise intact. The paranasal sinuses and mastoid air cells are clear.  The postcontrast images demonstrate no pathologic enhancement.  IMPRESSION: 1. No acute or focal lesion to explain the patient's symptoms. 2. Moderate atrophy and white matter disease likely reflects the sequela of chronic microvascular ischemia.   Electronically Signed   By: Lawrence Santiago M.D.   On: 08/27/2014 12:57    EKG: Independently reviewed. Possible junctional rhythm but no acute ST elevation.  Assessment/Plan   1. Acute diastolic congestive heart failure-this appears to be the clinical diagnosis. Will treat her with intravenous Lasix. She will need daily weights and strict input/output measurements. Serial cardiac enzymes. Cardiology consultation. 2. Hypertension-continue to monitor and continue home medications. 3. Diabetes-continue home medications with sliding scale of insulin. 4. Chronic kidney disease-monitor renal function whilst she is having intravenous diuresis.  Further recommendations will depend on patient's hospital progress.   Code Status: Full code   DVT Prophylaxis: Lovenox.  Family Communication: I discussed the plan with the patient at the bedside.  Disposition Plan: Home  when medically stable.  Time spent: 60 minutes.  Doree Albee [facility] Pager [phone].

## 2014-08-28 NOTE — ED Provider Notes (Signed)
CSN: [id number]     Arrival date & time 08/28/14  4259 History  This chart was scribed for Mariea Clonts, MD by Jeanell Sparrow, ED Scribe. This patient was seen in room APA05/APA05 and the patient's care was started at 6:59 PM.   Chief Complaint  Patient presents with  . Hypertension   The history is provided by the patient. No language interpreter was used.   HPI Comments: Theresa Mclean is a 80 y.o. female who presents to the Emergency Department complaining of constant moderate generalized weakness that occurred today. She reports that today that she had generalized weakness with high blood pressure at home. She reports that she has been having SOB and diarrhea, gradually worsening sob recently. Orthopnea.  Unsure weight gain. . She states that she was here for further evaluation. She is unsure leg swelling, denies chest pain, or abdominal pain.     Past Medical History  Diagnosis Date  . Hypertension   . Nephrolithiasis   . GERD (gastroesophageal reflux disease)   . Arthritis   . Psoriasis   . Morbid obesity   . Mitral regurgitation   . Hyperlipidemia   . Sleep apnea     uses cpap  . CHF (congestive heart failure)     Diastolic. EF 55-65% by cath 10/11/11  . Diabetes mellitus     insulin dependent  . Acute renal insufficiency     09/2011. not on ACEI secondary to this (also has renal artery stenosis)  . Peripheral vascular disease     severe right renal artery stenosis and left SFA stenosis by PV angio 09/2011, treated medically  . Cellulitis   . CAD (coronary artery disease)     NSTEMI 09/2011 felt poor candidate for CABG, instead s/p PTCA/DES to mid RCA 10/17/11  . Chronic respiratory failure   . Pulmonary hypertension   . Obstructive sleep apnea   . CKD (chronic kidney disease) stage 2, GFR 60-89 ml/min 07/11/2013  . Vitamin D deficiency    Past Surgical History  Procedure Laterality Date  . Acromio-clavicular joint repair  2011  . Abdominal hysterectomy    . Eye  surgery      cataract  OD  . Lower extremity angiogram N/A 10/11/2011    Procedure: LOWER EXTREMITY ANGIOGRAM;  Surgeon: Sherren Mocha, MD;  Location: [facility] CATH LAB;  Service: Cardiovascular;  Laterality: N/A;  . Left and right heart catheterization with coronary angiogram N/A 10/11/2011    Procedure: LEFT AND RIGHT HEART CATHETERIZATION WITH CORONARY ANGIOGRAM;  Surgeon: Sherren Mocha, MD;  Location: [facility] CATH LAB;  Service: Cardiovascular;  Laterality: N/A;  . Percutaneous coronary stent intervention (pci-s) N/A 10/17/2011    Procedure: PERCUTANEOUS CORONARY STENT INTERVENTION (PCI-S);  Surgeon: Burnell Blanks, MD;  Location: [facility] CATH LAB;  Service: Cardiovascular;  Laterality: N/A;   Family History  Problem Relation Age of Onset  . Psoriasis Brother    History  Substance Use Topics  . Smoking status: Never Smoker   . Smokeless tobacco: Never Used  . Alcohol Use: No   OB History    No data available     Review of Systems  Constitutional: Positive for fatigue. Negative for fever and chills.  HENT: Negative for congestion.   Eyes: Negative for visual disturbance.  Respiratory: Positive for cough and shortness of breath.   Cardiovascular: Negative for chest pain and leg swelling.  Gastrointestinal: Negative for vomiting and abdominal pain.  Genitourinary: Negative for dysuria and flank pain.  Musculoskeletal: Negative  for back pain, neck pain and neck stiffness.  Skin: Negative for rash.  Neurological: Negative for light-headedness.  All other systems reviewed and are negative.   Allergies  Review of patient's allergies indicates no known allergies.  Home Medications   Prior to Admission medications   Medication Sig Start Date End Date Taking? Authorizing Provider  acitretin (SORIATANE) 10 MG capsule Take 10 mg by mouth daily with supper. 08/01/13   Historical Provider, MD  amLODipine (NORVASC) 10 MG tablet Take 10 mg by mouth daily.    Historical Provider, MD  aspirin  81 MG tablet Take 81 mg by mouth daily.    Historical Provider, MD  atorvastatin (LIPITOR) 20 MG tablet Take 20 mg by mouth at bedtime.     Historical Provider, MD  bisacodyl (DULCOLAX) 10 MG suppository Place 1 suppository (10 mg total) rectally 2 (two) times daily. 07/14/14   Geradine Girt, DO  clobetasol (OLUX) 0.05 % topical foam Apply to scalp daily as needed 04/24/13   Historical Provider, MD  docusate sodium 100 MG CAPS Take 200 mg by mouth 2 (two) times daily. 07/14/14   Geradine Girt, DO  feeding supplement, GLUCERNA SHAKE, (GLUCERNA SHAKE) LIQD Take 237 mLs by mouth daily after supper. 07/14/14   Geradine Girt, DO  fexofenadine (ALLEGRA) 180 MG tablet Take 180 mg by mouth daily. During allergy seasons.    Historical Provider, MD  folic acid (FOLVITE) 1 MG tablet Take 1 mg by mouth daily.    Historical Provider, MD  furosemide (LASIX) 40 MG tablet TAKE 1 TABLET BY MOUTH DAILY. 03/10/14   Herminio Commons, MD  gabapentin (NEURONTIN) 100 MG capsule Take 2 capsules (200 mg total) by mouth 2 (two) times daily. 07/14/14   Geradine Girt, DO  hydrALAZINE (APRESOLINE) 100 MG tablet TAKE 1 TABLET BY MOUTH THREE TIMES DAILY 08/11/14   Herminio Commons, MD  HYDROcodone-acetaminophen (NORCO) 7.5-325 MG per tablet Take 1 tablet by mouth every 6 (six) hours as needed for moderate pain.    Historical Provider, MD  insulin aspart (NOVOLOG) 100 UNIT/ML injection SLIDING SCALE 0-9 Units, Injected subcutaneous, 3 times daily with meals. CBG < 70: implement hypoglycemia protocol CBG 70 - 120: 0 units CBG 121 - 150: 1 unit CBG 151 - 200: 2 units CBG 201 - 250: 3 units CBG 251 - 300: 5 units CBG 301 - 350: 7 units CBG 351 - 400: 9 units CBG > 400: call MD Patient taking differently: Inject 0-9 Units into the skin 3 (three) times daily with meals. SLIDING SCALE 0-9 Units, Injected subcutaneous, 3 times daily with meals. CBG < 70: implement hypoglycemia protocol CBG 70 - 120: 0 units CBG 121 - 150:  1 unit CBG 151 - 200: 2 units CBG 201 - 250: 3 units CBG 251 - 300: 5 units CBG 301 - 350: 7 units CBG 351 - 400: 9 units CBG > 400: call MD 10/20/11   [name], PA-C  insulin glargine (LANTUS) 100 UNIT/ML injection Inject 26 Units into the skin 2 (two) times daily.    Historical Provider, MD  Linaclotide Rolan Lipa) 145 MCG CAPS capsule Take 145 mcg by mouth daily as needed.    Historical Provider, MD  methocarbamol (ROBAXIN) 750 MG tablet Take 1 po QID for muscle pain 11/24/13   [name], MD  methotrexate (RHEUMATREX) 2.5 MG tablet Take 7.5 mg by mouth every Wednesday. Caution:Chemotherapy. Protect from light. Takes on Wednesday.    Historical  Provider, MD  montelukast (SINGULAIR) 10 MG tablet Take 10 mg by mouth daily.    Historical Provider, MD  NITROSTAT 0.4 MG SL tablet PLACE 1 TABLET UNDER THE TONGUE EVERY 5 MINUTES AS NEEDED FOR CHEST PAIN. Patient not taking: Reported on 08/12/2014 08/05/14   Herminio Commons, MD  pantoprazole (PROTONIX) 40 MG tablet Take 40 mg by mouth daily.     Historical Provider, MD  polyethylene glycol (MIRALAX / GLYCOLAX) packet Take 17 g by mouth daily as needed for moderate constipation. 07/12/13   Rexene Alberts, MD  ranolazine (RANEXA) 500 MG 12 hr tablet Take 1 tablet (500 mg total) by mouth 2 (two) times daily. 05/26/14   Herminio Commons, MD  senna (SENOKOT) 8.6 MG TABS tablet Take 1 tablet (8.6 mg total) by mouth 2 (two) times daily. 07/14/14   Geradine Girt, DO  spironolactone (ALDACTONE) 25 MG tablet Take 1 tablet (25 mg total) by mouth daily. 08/16/13   Kathie Dike, MD   BP 168/34 mmHg  Pulse 75  Temp(Src) 98.6 F (37 C) (Oral)  Resp 36  Ht 5\' 6"  (1.676 m)  Wt 200 lb (90.719 kg)  BMI 32.30 kg/m2  SpO2 100%  LMP 08/29/1990 Physical Exam  Constitutional: She is oriented to person, place, and time. She appears well-developed and well-nourished. No distress.  HENT:  Head: Normocephalic and atraumatic.  Eyes: Right eye exhibits no  discharge. Left eye exhibits no discharge. No scleral icterus.  Neck: Neck supple. No JVD present. No tracheal deviation present.  Cardiovascular: Normal rate.   Pulmonary/Chest: No respiratory distress (increased effort). She has no wheezes. She has rales.  Tachypnea. Crackles at the bases in both lung fields.    Abdominal: Soft. She exhibits no distension. There is no tenderness. There is no rebound and no guarding.  Musculoskeletal: She exhibits edema.  +1 pitting edema in BLE.   Neurological: She is alert and oriented to person, place, and time. No cranial nerve deficit.  General bilateral weakness. 4/5 strength.   Skin: Skin is warm and dry.  Dry skin  Psychiatric: She has a normal mood and affect.  Nursing note and vitals reviewed.   ED Course  Procedures (including critical care time) Emergency Ultrasound Study:   Angiocath insertion Performed by: Mariea Clonts  Consent: Verbal consent obtained. Risks and benefits: risks, benefits and alternatives were discussed Immediately prior to procedure the correct patient, procedure, equipment, support staff and site/side marked as needed.  Indication: difficult IV access Preparation: Patient was prepped and draped in the usual sterile fashion. Vein Location: left ac vein was visualized during assessment for potential access sites and was found to be patent/ easily compressed with linear ultrasound.  The needle was visualized with real-time ultrasound and guided into the vein. Gauge:20 g  Image saved and stored.  Normal blood return.  Patient tolerance: Patient tolerated the procedure well with no immediate complications.     DIAGNOSTIC STUDIES: Oxygen Saturation is 100% on RA, normal by my interpretation.    COORDINATION OF CARE: 7:03 PM- Pt advised of plan for treatment which includes radiology and labs and pt agrees.  Labs Review Labs Reviewed  CBC WITH DIFFERENTIAL - Abnormal; Notable for the following:    WBC 15.8  (*)    RDW 19.6 (*)    Neutrophils Relative % 88 (*)    Neutro Abs 13.9 (*)    Lymphocytes Relative 6 (*)    All other components within normal limits  BRAIN NATRIURETIC  PEPTIDE - Abnormal; Notable for the following:    B Natriuretic Peptide 1440.0 (*)    All other components within normal limits  COMPREHENSIVE METABOLIC PANEL - Abnormal; Notable for the following:    Glucose, Bld 175 (*)    BUN 39 (*)    Creatinine, Ser 1.31 (*)    Calcium 10.7 (*)    GFR calc non Af Amer 37 (*)    GFR calc Af Amer 42 (*)    All other components within normal limits  TROPONIN I  URINALYSIS, ROUTINE W REFLEX MICROSCOPIC  CBC  CREATININE, SERUM  BRAIN NATRIURETIC PEPTIDE  TSH  TROPONIN I  TROPONIN I  TROPONIN I  COMPREHENSIVE METABOLIC PANEL  CBC    Imaging Review Dg Chest 2 View  08/28/2014   CLINICAL DATA:  Dyspnea.  EXAM: CHEST  2 VIEW  COMPARISON:  July 11, 2014.  FINDINGS: Stable cardiomegaly. No pneumothorax or pleural effusion is noted. Stable interstitial densities are noted in both lung bases most consistent with scarring, but superimposed edema cannot be excluded. Bony thorax is intact.  IMPRESSION: Stable cardiomegaly. Stable bibasilar interstitial densities most consistent with scarring, but superimposed pulmonary edema cannot be excluded.   Electronically Signed   By: Sabino Dick M.D.   On: 08/28/2014 20:16   Mr Jeri Cos OA Contrast  08/27/2014   CLINICAL DATA:  Tremors. Weakness in the upper and lower extremities. Unable to walk. Nystagmus.  EXAM: MRI HEAD WITHOUT AND WITH CONTRAST  TECHNIQUE: Multiplanar, multiecho pulse sequences of the brain and surrounding structures were obtained without and with intravenous contrast.  CONTRAST:  20 mL MultiHance  COMPARISON:  CT head without contrast 07/11/2014.  FINDINGS: No acute infarct, hemorrhage, or mass lesion is present. Moderate periventricular and subcortical white matter changes are noted bilaterally. Mild to moderate atrophy is  present as well.  The ventricles are proportionate to the degree of atrophy. White matter changes extend into the brainstem. No significant extraaxial fluid collection is present.  Flow is present in the major intracranial arteries.  A right lens replacement is noted. The globes and orbits are otherwise intact. The paranasal sinuses and mastoid air cells are clear.  The postcontrast images demonstrate no pathologic enhancement.  IMPRESSION: 1. No acute or focal lesion to explain the patient's symptoms. 2. Moderate atrophy and white matter disease likely reflects the sequela of chronic microvascular ischemia.   Electronically Signed   By: Lawrence Santiago M.D.   On: 08/27/2014 12:57     EKG Interpretation   Date/Time:  Thursday August 28 2014 18:46:16 EST Ventricular Rate:  75 PR Interval:    QRS Duration: 86 QT Interval:  379 QTC Calculation: 423 R Axis:   15 Text Interpretation:  Accelerated junctional rhythm Abnormal R-wave  progression, early transition LVH with secondary repolarization  abnormality similar previous Confirmed by Lyndsie Wallman  MD, Yu Peggs ([id number]) on  08/28/2014 6:57:01 PM      MDM   Final diagnoses:  Dyspnea  Acute congestive heart failure, unspecified congestive heart failure type  General weakness  Acute renal failure, unspecified acute renal failure type   I personally performed the services described in this documentation, which was scribed in my presence. The recorded information has been reviewed and is accurate.   patient with significant cardiac and respiratory history,  Cpap at night for sleep apnea  Presents with worsening shortness of breath in general weakness. Patient significant increased work of breathing with any activity in the ER or clinic rales a  cyst concern for acute heart failure  Lasix given.  The patients results and plan were reviewed and discussed.   Any x-rays performed were personally reviewed by myself.   Differential diagnosis were  considered with the presenting HPI.  Medications  furosemide (LASIX) injection 40 mg (not administered)  folic acid (FOLVITE) tablet 1 mg (not administered)  montelukast (SINGULAIR) tablet 10 mg (not administered)  pantoprazole (PROTONIX) EC tablet 40 mg (not administered)  loratadine (CLARITIN) tablet 10 mg (not administered)  atorvastatin (LIPITOR) tablet 20 mg (not administered)  aspirin tablet 81 mg (not administered)  amLODipine (NORVASC) tablet 10 mg (not administered)  polyethylene glycol (MIRALAX / GLYCOLAX) packet 17 g (not administered)  acitretin (SORIATANE) capsule 10 mg (not administered)  spironolactone (ALDACTONE) tablet 25 mg (not administered)  methocarbamol (ROBAXIN) tablet 750 mg (not administered)  HYDROcodone-acetaminophen (NORCO) 7.5-325 MG per tablet 1 tablet (not administered)  ranolazine (RANEXA) 12 hr tablet 500 mg (not administered)  bisacodyl (DULCOLAX) suppository 10 mg (not administered)  docusate sodium (COLACE) capsule 200 mg (not administered)  feeding supplement (GLUCERNA SHAKE) (GLUCERNA SHAKE) liquid 237 mL (not administered)  gabapentin (NEURONTIN) capsule 200 mg (not administered)  senna (SENOKOT) tablet 8.6 mg (not administered)  insulin glargine (LANTUS) injection 26 Units (not administered)  nitroGLYCERIN (NITROSTAT) SL tablet 0.4 mg (not administered)  hydrALAZINE (APRESOLINE) tablet 100 mg (not administered)  enoxaparin (LOVENOX) injection 30 mg (not administered)  sodium chloride 0.9 % injection 3 mL (not administered)  ondansetron (ZOFRAN) tablet 4 mg (not administered)    Or  ondansetron (ZOFRAN) injection 4 mg (not administered)  furosemide (LASIX) injection 80 mg (not administered)  insulin aspart (novoLOG) injection 0-15 Units (not administered)  insulin aspart (novoLOG) injection 0-5 Units (not administered)    Filed Vitals:   08/28/14 1900 08/28/14 1930 08/28/14 2000 08/28/14 2100  BP: 173/63 177/54 162/65 187/53  Pulse: 73 70   69  Temp:      TempSrc:      Resp: 30 23    Height:      Weight:      SpO2: 95% 95%  93%    Final diagnoses:  Dyspnea  Acute congestive heart failure, unspecified congestive heart failure type  General weakness  Acute renal failure, unspecified acute renal failure type    Admission/ observation were discussed with the admitting physician, patient and/or family and they are comfortable with the plan.    Mariea Clonts, MD 08/28/14 2219

## 2014-08-29 DIAGNOSIS — I2510 Atherosclerotic heart disease of native coronary artery without angina pectoris: Secondary | ICD-10-CM

## 2014-08-29 LAB — URINALYSIS, ROUTINE W REFLEX MICROSCOPIC
BILIRUBIN URINE: NEGATIVE
Glucose, UA: NEGATIVE mg/dL
HGB URINE DIPSTICK: NEGATIVE
Ketones, ur: NEGATIVE mg/dL
Leukocytes, UA: NEGATIVE
Nitrite: NEGATIVE
Protein, ur: NEGATIVE mg/dL
Specific Gravity, Urine: 1.01 (ref 1.005–1.030)
UROBILINOGEN UA: 0.2 mg/dL (ref 0.0–1.0)
pH: 5.5 (ref 5.0–8.0)

## 2014-08-29 LAB — CBC
HCT: 35.5 % — ABNORMAL LOW (ref 36.0–46.0)
Hemoglobin: 11.4 g/dL — ABNORMAL LOW (ref 12.0–15.0)
MCH: 26.2 pg (ref 26.0–34.0)
MCHC: 32.1 g/dL (ref 30.0–36.0)
MCV: 81.6 fL (ref 78.0–100.0)
PLATELETS: 230 10*3/uL (ref 150–400)
RBC: 4.35 MIL/uL (ref 3.87–5.11)
RDW: 19.7 % — AB (ref 11.5–15.5)
WBC: 11.6 10*3/uL — AB (ref 4.0–10.5)

## 2014-08-29 LAB — COMPREHENSIVE METABOLIC PANEL
ALBUMIN: 3.9 g/dL (ref 3.5–5.2)
ALK PHOS: 62 U/L (ref 39–117)
ALT: 24 U/L (ref 0–35)
AST: 15 U/L (ref 0–37)
Anion gap: 9 (ref 5–15)
BUN: 39 mg/dL — ABNORMAL HIGH (ref 6–23)
CALCIUM: 10.6 mg/dL — AB (ref 8.4–10.5)
CO2: 27 mmol/L (ref 19–32)
Chloride: 99 mEq/L (ref 96–112)
Creatinine, Ser: 1.06 mg/dL (ref 0.50–1.10)
GFR, EST AFRICAN AMERICAN: 55 mL/min — AB (ref >90)
GFR, EST NON AFRICAN AMERICAN: 47 mL/min — AB (ref >90)
Glucose, Bld: 96 mg/dL (ref 70–99)
POTASSIUM: 3.5 mmol/L (ref 3.5–5.1)
Sodium: 135 mmol/L (ref 135–145)
Total Bilirubin: 1.2 mg/dL (ref 0.3–1.2)
Total Protein: 7.7 g/dL (ref 6.0–8.3)

## 2014-08-29 LAB — GLUCOSE, CAPILLARY
GLUCOSE-CAPILLARY: 265 mg/dL — AB (ref 70–99)
Glucose-Capillary: 63 mg/dL — ABNORMAL LOW (ref 70–99)
Glucose-Capillary: 97 mg/dL (ref 70–99)
Glucose-Capillary: 213 mg/dL — ABNORMAL HIGH (ref 70–99)
Glucose-Capillary: 117 mg/dL — ABNORMAL HIGH (ref 70–99)

## 2014-08-29 LAB — TSH: TSH: 3.435 u[IU]/mL (ref 0.350–4.500)

## 2014-08-29 LAB — TROPONIN I
TROPONIN I: 0.03 ng/mL (ref <0.031)
TROPONIN I: 0.03 ng/mL (ref <0.031)

## 2014-08-29 LAB — BRAIN NATRIURETIC PEPTIDE: B NATRIURETIC PEPTIDE 5: 994 pg/mL — AB (ref 0.0–100.0)

## 2014-08-29 MED ORDER — ASPIRIN EC 81 MG PO TBEC
81.0000 mg | DELAYED_RELEASE_TABLET | Freq: Every day | ORAL | Status: DC
  Administered 2014-08-29 – 2014-08-30 (×2): 81 mg via ORAL
  Filled 2014-08-29: qty 1

## 2014-08-29 MED ORDER — FUROSEMIDE 10 MG/ML IJ SOLN
40.0000 mg | Freq: Every day | INTRAMUSCULAR | Status: DC
  Administered 2014-08-30: 40 mg via INTRAVENOUS
  Filled 2014-08-29: qty 4

## 2014-08-29 MED ORDER — ENOXAPARIN SODIUM 40 MG/0.4ML ~~LOC~~ SOLN
40.0000 mg | SUBCUTANEOUS | Status: DC
  Administered 2014-08-29: 40 mg via SUBCUTANEOUS
  Filled 2014-08-29: qty 0.4

## 2014-08-29 NOTE — Progress Notes (Signed)
Theresa Mclean YQM:[id number] DOB: [birth year] DOA: 08/28/2014 PCP: Theresa Chroman., MD  Brief narrative: 81 y/o ? h/o rest tremor, horz nystagmus, bradycardia, Chronic diastolic dysfucntion with mild AoS, htn,  History CAD status post PCI to RCA 2013 -repeat nuclear chest normal 11/14 [not a candidate for beta blocker secondary to bradycardia] , OSA on CPAP, peripheral vascular disease with severe right renal artery stenosis status post left SFA stenosis , hyperlipidemia , type 2 diabetes mellitus with complication of diabetic neuropathy , chronic cor pulmonale /severe pulmonary hypertension on catheter 2013 on 3 L of oxygen.   Baseline weight 07/08/14  ~ 213 pounds  admitted  12/ 31/15 for acute hypoxic respiratory failure likely secondary to diastolic his function combined with cor pulmonale  Past medical history-As per Problem list Chart reviewed as below-  reviewed  Consultants:   none  Procedures:   none  Antibiotics:   none   Subjective   feels better, breathing a little clear  Uses oxygen chronically at home 3 L for the past 2 years  tolerating diet  usually is constipated but is okay today  wonders about left great toe and if antibiotics are needed  no fever no chills  no cough  No dysuria No melena No hematemesis   Objective    Interim History:  none  Telemetry:  sinus bradycardia   Objective: Filed Vitals:   08/28/14 2322 08/29/14 0138 08/29/14 0424 08/29/14 0804  BP: 181/54  177/55   Pulse: 80 88 66   Temp: 99 F (37.2 C)  98.2 F (36.8 C)   TempSrc: Oral  Axillary   Resp:  22 14   Height: 5\' 4"  (1.626 m)     Weight: 92.6 kg (204 lb 2.3 oz)  92.4 kg (203 lb 11.3 oz)   SpO2: 99% 92% 100% 99%    Intake/Output Summary (Last 24 hours) at 08/29/14 1423 Last data filed at 08/29/14 1009  Gross per 24 hour  Intake    240 ml  Output   1550 ml  Net  -1310 ml    Exam:  General:  EOMI NCAT Cardiovascular:  S1-S2 no murmur rub or  gallop Respiratory:  Clinically clear with mild crackles posterior basal Abdomen:  Soft nontender nondistended no rebound, Skin  Great toe on left foot appears to be read but not swollen not infected appearing and stable Neuro grossly intact moving all 4 limbs equally no  Data Reviewed: Basic Metabolic Panel:  Recent Labs Lab 08/28/14 1944 08/28/14 2208 08/29/14 0407  NA 136  --  135  K 4.5  --  3.5  CL 103  --  99  CO2 24  --  27  GLUCOSE 175*  --  96  BUN 39*  --  39*  CREATININE 1.31* 1.28* 1.06  CALCIUM 10.7*  --  10.6*   Liver Function Tests:  Recent Labs Lab 08/28/14 1944 08/29/14 0407  AST 17 15  ALT 28 24  ALKPHOS 67 62  BILITOT 1.0 1.2  PROT 8.1 7.7  ALBUMIN 4.4 3.9   No results for input(s): LIPASE, AMYLASE in the last 168 hours. No results for input(s): AMMONIA in the last 168 hours. CBC:  Recent Labs Lab 08/28/14 1932 08/28/14 2208 08/29/14 0407  WBC 15.8* 15.6* 11.6*  NEUTROABS 13.9*  --   --   HGB 12.0 10.9* 11.4*  HCT 38.2 34.9* 35.5*  MCV 83.4 82.5 81.6  PLT 238 247 230   Cardiac Enzymes:  Recent Labs Lab  08/28/14 1932 08/28/14 2208 08/29/14 0407 08/29/14 1007  TROPONINI 0.03 0.03 0.03 0.03   BNP: Invalid input(s): POCBNP CBG:  Recent Labs Lab 08/28/14 2335 08/29/14 0729 08/29/14 0803 08/29/14 1131  GLUCAP 168* 63* 97 213*    No results found for this or any previous visit (from the past 240 hour(s)).   Studies:              All Imaging reviewed and is as per above notation   Scheduled Meds: . acitretin  10 mg Oral Q supper  . amLODipine  10 mg Oral Daily  . aspirin EC  81 mg Oral Daily  . atorvastatin  20 mg Oral QHS  . bisacodyl  10 mg Rectal BID  . docusate sodium  200 mg Oral BID  . enoxaparin (LOVENOX) injection  40 mg Subcutaneous Q24H  . feeding supplement (GLUCERNA SHAKE)  237 mL Oral QPC supper  . folic acid  1 mg Oral Daily  . furosemide  80 mg Intravenous Q12H  . gabapentin  200 mg Oral BID  .  hydrALAZINE  100 mg Oral TID  . insulin aspart  0-15 Units Subcutaneous TID WC  . insulin aspart  0-5 Units Subcutaneous QHS  . insulin glargine  26 Units Subcutaneous BID  . loratadine  10 mg Oral Daily  . montelukast  10 mg Oral Daily  . pantoprazole  40 mg Oral Daily  . ranolazine  500 mg Oral BID  . senna  1 tablet Oral BID  . sodium chloride  3 mL Intravenous Q12H  . spironolactone  25 mg Oral Daily   Continuous Infusions:    Assessment/Plan:  1.  acute multifactorial respiratory failure - secondary to diastolic his function , aortic stenosis, obstructive sleep apnea, chronic cor pulmonale  2.  acute decompensated diastolic dysfunction last echocardiogram EF 60-65% , PA peak pressure 39 mmHg with mild aortic stenosis- Continue diuresis with Aldactone 25 daily ,  Lasix dose cut back from 80 twice a day IV to 40 mg IV daily. Home dose of medication is 40 mg once daily  3.  acute kidney injury- repeat basic metabolic panel in a.m. , BUN/creatinine baseline 39/ 1.31 4.  diabetic neuropathy with mild hyperglycemia and diabetes- Lantus 26 units daily, moderate sliding scale coverage- blood sugars appear to be 97 and 213  Continue gabapentin 200 2 capsules twice a day 5.  hypertension-continue amlodipine 10 daily , hydralazine at 1 3 times a day 100 mg , Aldactone 25 daily  6.  rheumatoid arthritis-continue methotrexate 7.5 every Wednesday- caution regarding this medication- can affect pulmonary system and cause methotrexate lung - consider outpatient  Pulmonology input 7.  COPD- continue Singulair 10 mg daily  , discontinue fexofenadine as can raise blood pressure 8.  hyperlipidemia continue atorvastatin 20 daily at bedtime 9.  History CAD -continue 81 mg aspirin daily  10.  constipation-continue linzess 145 g daily, MiraLAX 17 g daily , senna one tablet twice a day , Dulcolax 10 mg suppositories when necessary 11.  reflux-continue pantoprazole 40 daily 12.  leukocytosis-unclear etiology  -monitor -no fever no chills but chest x-ray showed only pulmonary edema versus scarring. UA was negative 13.  resting tremor - follow-up with neurology as an outpatient  Code Status:  full Family Communication: Theresa, Mclean Son [phone]  [phone]  NO answer, left VM fior him ansd will update later Disposition Plan: inpatient pending resolution    Verneita Griffes, MD  [facility] Pager [phone] 08/29/2014, 2:23  PM    LOS: 1 day

## 2014-08-29 NOTE — Plan of Care (Signed)
Notified Dr. Verlon Au due to the patient being on Lasix 80 mg and needing strict I/O's.  She does have incont. Episodes at times.  New orders given and followed.

## 2014-08-29 NOTE — Progress Notes (Signed)
Patient arrived via stretcher accompany by family. Patient alert and oriented. She denies any pain. Respirations even nonlabored. Patient has area to left toe,  family stated it was being treated with antibotics. patient also have red area to left lateral of left toe. Patient is on C-Pap, tolerating well. Patient is voiding well.

## 2014-08-29 NOTE — Progress Notes (Addendum)
Attempted to call Theresa Mclean the patients daughter listed on the chart in order to ask someone to bring in the Helen since the we do not stock it here.  No one answered I will try to call back.  Message was left to call me back.  [place] with [facility] about the medication and she stated that her mom lived with her brother in [place] and that she would notify him and ask him to bring the medication. Late Entry

## 2014-08-30 LAB — CBC WITH DIFFERENTIAL/PLATELET
BASOS PCT: 0 % (ref 0–1)
Basophils Absolute: 0 10*3/uL (ref 0.0–0.1)
EOS ABS: 0.3 10*3/uL (ref 0.0–0.7)
Eosinophils Relative: 4 % (ref 0–5)
HCT: 31.2 % — ABNORMAL LOW (ref 36.0–46.0)
Hemoglobin: 9.9 g/dL — ABNORMAL LOW (ref 12.0–15.0)
LYMPHS ABS: 0.9 10*3/uL (ref 0.7–4.0)
LYMPHS PCT: 13 % (ref 12–46)
MCH: 26.4 pg (ref 26.0–34.0)
MCHC: 31.7 g/dL (ref 30.0–36.0)
MCV: 83.2 fL (ref 78.0–100.0)
Monocytes Absolute: 0.2 10*3/uL (ref 0.1–1.0)
Monocytes Relative: 2 % — ABNORMAL LOW (ref 3–12)
NEUTROS PCT: 81 % — AB (ref 43–77)
Neutro Abs: 5.7 10*3/uL (ref 1.7–7.7)
PLATELETS: 232 10*3/uL (ref 150–400)
RBC: 3.75 MIL/uL — ABNORMAL LOW (ref 3.87–5.11)
RDW: 19.5 % — ABNORMAL HIGH (ref 11.5–15.5)
WBC: 7 10*3/uL (ref 4.0–10.5)

## 2014-08-30 LAB — GLUCOSE, CAPILLARY
GLUCOSE-CAPILLARY: 93 mg/dL (ref 70–99)
GLUCOSE-CAPILLARY: 160 mg/dL — AB (ref 70–99)
GLUCOSE-CAPILLARY: 254 mg/dL — AB (ref 70–99)

## 2014-08-30 LAB — BASIC METABOLIC PANEL
Anion gap: 8 (ref 5–15)
Anion gap: 9 (ref 5–15)
BUN: 42 mg/dL — AB (ref 6–23)
BUN: 40 mg/dL — ABNORMAL HIGH (ref 6–23)
CALCIUM: 10.5 mg/dL (ref 8.4–10.5)
CHLORIDE: 97 meq/L (ref 96–112)
CO2: 29 mmol/L (ref 19–32)
CO2: 28 mmol/L (ref 19–32)
CREATININE: 1.27 mg/dL — AB (ref 0.50–1.10)
Calcium: 10.3 mg/dL (ref 8.4–10.5)
Chloride: 95 mEq/L — ABNORMAL LOW (ref 96–112)
Creatinine, Ser: 1.24 mg/dL — ABNORMAL HIGH (ref 0.50–1.10)
GFR calc Af Amer: 45 mL/min — ABNORMAL LOW (ref >90)
GFR calc non Af Amer: 38 mL/min — ABNORMAL LOW (ref >90)
GFR, EST AFRICAN AMERICAN: 44 mL/min — AB (ref >90)
GFR, EST NON AFRICAN AMERICAN: 39 mL/min — AB (ref >90)
Glucose, Bld: 114 mg/dL — ABNORMAL HIGH (ref 70–99)
Glucose, Bld: 258 mg/dL — ABNORMAL HIGH (ref 70–99)
Potassium: 4 mmol/L (ref 3.5–5.1)
Potassium: 4.7 mmol/L (ref 3.5–5.1)
SODIUM: 132 mmol/L — AB (ref 135–145)
Sodium: 134 mmol/L — ABNORMAL LOW (ref 135–145)

## 2014-08-30 MED ORDER — PREDNISONE 20 MG PO TABS
40.0000 mg | ORAL_TABLET | Freq: Every day | ORAL | Status: DC
  Administered 2014-08-30: 40 mg via ORAL
  Filled 2014-08-30: qty 2

## 2014-08-30 MED ORDER — PREDNISONE 20 MG PO TABS: 40.0000 mg | ORAL_TABLET | Freq: Every day | ORAL | Status: DC

## 2014-08-30 MED ORDER — INSULIN ASPART 100 UNIT/ML ~~LOC~~ SOLN: 0.0000 [IU] | Freq: Every day | SUBCUTANEOUS | Status: DC

## 2014-08-30 MED ORDER — FOLIC ACID 1 MG PO TABS: 1.0000 mg | ORAL_TABLET | Freq: Every day | ORAL | Status: DC

## 2014-08-30 MED ORDER — FUROSEMIDE 40 MG PO TABS: 40.0000 mg | ORAL_TABLET | Freq: Every day | ORAL | Status: DC

## 2014-08-30 MED ORDER — INSULIN ASPART 100 UNIT/ML ~~LOC~~ SOLN: 0.0000 [IU] | Freq: Three times a day (TID) | SUBCUTANEOUS | Status: DC

## 2014-08-30 NOTE — Discharge Summary (Signed)
Physician Discharge Summary  [place] H Linden OIZ:[id number] DOB: [birth year] DOA: 08/28/2014  PCP: Glenda Chroman., MD  Admit date: 08/28/2014 Discharge date: 08/30/2014  Time spent: 35 minutes  Recommendations for Outpatient Follow-up:  1. Patient will need basic metabolic panel as well as CBC in about one week 2. Steroid burst given for 4 days 40 mg prednisone which she should probably discontinue subsequently and continue her low montelukast. 3. I would advise against prescribing any antihistamine or any medication that has propensity to raise her blood pressure-this may contribute to to her decompensated CHF 4. She will need daily weights and she would need close outpatient monitoring 5. Consider outpatient PFTs for consideration of methotrexate lung-follow-up with pulmonology either in [place] or eat in 6. Has an aide at home about 5-6 hours a day 7. Consider advanced heart failure clinic visit in [place] as she also has cor pulmonale and may need PEG EF 2 inhibitors  Discharge Diagnoses:  Active Problems:   Diabetes mellitus   CAD (coronary artery disease)   Acute diastolic congestive heart failure   Essential hypertension   Chronic kidney disease   Discharge Condition: Reasonably fair  Diet recommendation: Heart healthy low-salt  Filed Weights   08/29/14 0424 08/29/14 1739 08/30/14 0649  Weight: 92.4 kg (203 lb 11.3 oz) 91 kg (200 lb 9.9 oz) 92.3 kg (203 lb 7.8 oz)    History of present illness:  81 y/o ? h/o rest tremor, horz nystagmus, bradycardia, Chronic diastolic dysfucntion with mild AoS, htn, History CAD status post PCI to RCA 2013 -repeat nuclear chest normal 11/14 [not a candidate for beta blocker secondary to bradycardia] , OSA on CPAP, peripheral vascular disease with severe right renal artery stenosis status post left SFA stenosis , hyperlipidemia , type 2 diabetes mellitus with complication of diabetic neuropathy , chronic cor pulmonale /severe pulmonary  hypertension on catheter 2013 on 3 L of oxygen. Baseline weight 07/08/14 ~ 213 pounds admitted 12/ 31/15 for acute hypoxic respiratory failure likely secondary to diastolic his function combined with cor pulmonale  Hospital Course:  Assessment/Plan:  1. acute multifactorial respiratory failure - secondary to diastolic his function , aortic stenosis, obstructive sleep apnea, chronic cor pulmonale. Chronic O2 use ~1.5 yrs [her Cardiac Md Rx it for her as OP-Pulmonology Dr. Marina Gravel in [facility] ] 2. Acute decompensated diastolic dysfunction last echocardiogram EF 60-65% , PA peak pressure 39 mmHg with mild aortic stenosis- Continue diuresis with Aldactone 25 daily , Lasix dose cut back from 80 twice a day IV Home dose of medication is 40 mg once daily  3. acute kidney injury- repeat basic metabolic panel in a.m. , BUN/creatinine baseline 39/ 1.31-->42/1.24. Repeat basic metabolic panel on discharge 08/30/14 was similar to prior so it was felt she had reached her baseline. 4. diabetic neuropathy with mild hyperglycemia and diabetes- Lantus 26 units daily, moderate sliding scale coverage- blood sugars appear to be 93 and 160 Continue gabapentin 200 2 capsules twice a day 5. hypertension-continue amlodipine 10 daily , hydralazine at 1 3 times a day 100 mg , Aldactone 25 daily  6. rheumatoid arthritis-continue methotrexate 7.5 every Wednesday- caution regarding this medication- can affect pulmonary system and cause methotrexate lung -OP PFTs are pending and appreciate Dr. Luan Pulling input-schedule outpatient PFTs 7. COPD- continue Singulair 10 mg daily , discontinue fexofenadine as can raise blood pressure 8. hyperlipidemia continue atorvastatin 20 daily at bedtime 9. History CAD -continue 81 mg aspirin daily  10. constipation-continue linzess 145 g daily, MiraLAX 17 g  daily , senna one tablet twice a day, Dulcolax 10 mg suppositories when necessary 11. reflux-continue pantoprazole 40  daily 12. leukocytosis-unclear etiology -monitor -no fever no chills but chest x-ray showed only pulmonary edema versus scarring. UA was negative 13. resting tremor - follow-up with neurology as an outpatient   Discharge Exam: Filed Vitals:   08/30/14 1500  BP: 148/50  Pulse: 70  Temp: 98 F (36.7 C)  Resp: 18   Alert pleasant oriented no apparent distress  General: EOMI NCAT Cardiovascular: S1-S2 no murmur rub or gallop Respiratory: Clinically clear  Discharge Instructions   Discharge Instructions    Diet - low sodium heart healthy    Complete by:  As directed      Discharge instructions    Complete by:  As directed   Complete 4 days of steroids for shortness of breath and follow-up with your regular physician either in [place] where cardiologist I recommend that you get some blood work done in about one week's time Keep a close watch on her weight and try not to eat too much salt and remember to restrict her fluids to about 1800 cc a day, that is is about maybe 7-8 8 ounce glasses sit do not drink more than this If you gained more than 2-3 pounds over a 24-hour period of time he may need to take another dose of her Lasix 40 mg. And lightheaded Dr. none same time     Increase activity slowly    Complete by:  As directed           Current Discharge Medication List    START taking these medications   Details  !! insulin aspart (NOVOLOG) 100 UNIT/ML injection Inject 0-15 Units into the skin 3 (three) times daily with meals. Qty: 10 mL, Refills: 11    !! insulin aspart (NOVOLOG) 100 UNIT/ML injection Inject 0-5 Units into the skin at bedtime. Qty: 10 mL, Refills: 11    predniSONE (DELTASONE) 20 MG tablet Take 2 tablets (40 mg total) by mouth daily with breakfast. Qty: 8 tablet, Refills: 0     !! - Potential duplicate medications found. Please discuss with provider.    CONTINUE these medications which have CHANGED   Details  folic acid (FOLVITE) 1 MG tablet Take 1  tablet (1 mg total) by mouth daily. Qty: 30 tablet, Refills: 0      CONTINUE these medications which have NOT CHANGED   Details  acitretin (SORIATANE) 10 MG capsule Take 10 mg by mouth daily with supper.    amLODipine (NORVASC) 10 MG tablet Take 10 mg by mouth daily.    aspirin 81 MG tablet Take 81 mg by mouth daily.    atorvastatin (LIPITOR) 20 MG tablet Take 20 mg by mouth at bedtime.     bisacodyl (DULCOLAX) 10 MG suppository Place 1 suppository (10 mg total) rectally 2 (two) times daily. Qty: 12 suppository, Refills: 0    clobetasol (OLUX) 0.05 % topical foam Apply to scalp daily as needed    docusate sodium 100 MG CAPS Take 200 mg by mouth 2 (two) times daily. Qty: 10 capsule, Refills: 0    feeding supplement, GLUCERNA SHAKE, (GLUCERNA SHAKE) LIQD Take 237 mLs by mouth daily after supper. Qty: 12 Can, Refills: 2    furosemide (LASIX) 40 MG tablet TAKE 1 TABLET BY MOUTH DAILY. Qty: 30 tablet, Refills: 6    gabapentin (NEURONTIN) 100 MG capsule Take 2 capsules (200 mg total) by mouth 2 (two) times daily.  Qty: 120 capsule, Refills: 0    hydrALAZINE (APRESOLINE) 100 MG tablet TAKE 1 TABLET BY MOUTH THREE TIMES DAILY Qty: 90 tablet, Refills: 3    HYDROcodone-acetaminophen (NORCO) 7.5-325 MG per tablet Take 1 tablet by mouth every 6 (six) hours as needed for moderate pain.    !! insulin aspart (NOVOLOG) 100 UNIT/ML injection SLIDING SCALE 0-9 Units, Injected subcutaneous, 3 times daily with meals. CBG < 70: implement hypoglycemia protocol CBG 70 - 120: 0 units CBG 121 - 150: 1 unit CBG 151 - 200: 2 units CBG 201 - 250: 3 units CBG 251 - 300: 5 units CBG 301 - 350: 7 units CBG 351 - 400: 9 units CBG > 400: call MD    insulin glargine (LANTUS) 100 UNIT/ML injection Inject 26 Units into the skin 2 (two) times daily.    Linaclotide (LINZESS) 145 MCG CAPS capsule Take 145 mcg by mouth daily as needed.    methocarbamol (ROBAXIN) 750 MG tablet Take 1 po QID for muscle  pain Qty: 40 tablet, Refills: 0    methotrexate (RHEUMATREX) 2.5 MG tablet Take 7.5 mg by mouth every Wednesday. Caution:Chemotherapy. Protect from light. Takes on Wednesday.    montelukast (SINGULAIR) 10 MG tablet Take 10 mg by mouth daily.    pantoprazole (PROTONIX) 40 MG tablet Take 40 mg by mouth daily.     polyethylene glycol (MIRALAX / GLYCOLAX) packet Take 17 g by mouth daily as needed for moderate constipation.    ranolazine (RANEXA) 500 MG 12 hr tablet Take 1 tablet (500 mg total) by mouth 2 (two) times daily. Qty: 28 tablet, Refills: 0    senna (SENOKOT) 8.6 MG TABS tablet Take 1 tablet (8.6 mg total) by mouth 2 (two) times daily. Qty: 120 each, Refills: 0    spironolactone (ALDACTONE) 25 MG tablet Take 1 tablet (25 mg total) by mouth daily. Qty: 30 tablet, Refills: 1     !! - Potential duplicate medications found. Please discuss with provider.    STOP taking these medications     fexofenadine (ALLEGRA) 180 MG tablet      NITROSTAT 0.4 MG SL tablet        No Known Allergies    The results of significant diagnostics from this hospitalization (including imaging, microbiology, ancillary and laboratory) are listed below for reference.    Significant Diagnostic Studies: Dg Chest 2 View  08/28/2014   CLINICAL DATA:  Dyspnea.  EXAM: CHEST  2 VIEW  COMPARISON:  July 11, 2014.  FINDINGS: Stable cardiomegaly. No pneumothorax or pleural effusion is noted. Stable interstitial densities are noted in both lung bases most consistent with scarring, but superimposed edema cannot be excluded. Bony thorax is intact.  IMPRESSION: Stable cardiomegaly. Stable bibasilar interstitial densities most consistent with scarring, but superimposed pulmonary edema cannot be excluded.   Electronically Signed   By: Sabino Dick M.D.   On: 08/28/2014 20:16   Mr Theresa Mclean RU Contrast  08/27/2014   CLINICAL DATA:  Tremors. Weakness in the upper and lower extremities. Unable to walk. Nystagmus.   EXAM: MRI HEAD WITHOUT AND WITH CONTRAST  TECHNIQUE: Multiplanar, multiecho pulse sequences of the brain and surrounding structures were obtained without and with intravenous contrast.  CONTRAST:  20 mL MultiHance  COMPARISON:  CT head without contrast 07/11/2014.  FINDINGS: No acute infarct, hemorrhage, or mass lesion is present. Moderate periventricular and subcortical white matter changes are noted bilaterally. Mild to moderate atrophy is present as well.  The ventricles are proportionate to  the degree of atrophy. White matter changes extend into the brainstem. No significant extraaxial fluid collection is present.  Flow is present in the major intracranial arteries.  A right lens replacement is noted. The globes and orbits are otherwise intact. The paranasal sinuses and mastoid air cells are clear.  The postcontrast images demonstrate no pathologic enhancement.  IMPRESSION: 1. No acute or focal lesion to explain the patient's symptoms. 2. Moderate atrophy and white matter disease likely reflects the sequela of chronic microvascular ischemia.   Electronically Signed   By: Lawrence Santiago M.D.   On: 08/27/2014 12:57    Microbiology: No results found for this or any previous visit (from the past 240 hour(s)).   Labs: Basic Metabolic Panel:  Recent Labs Lab 08/28/14 1944 08/28/14 2208 08/29/14 0407 08/30/14 0600 08/30/14 1536  NA 136  --  135 134* 132*  K 4.5  --  3.5 4.0 4.7  CL 103  --  99 97 95*  CO2 24  --  27 29 28   GLUCOSE 175*  --  96 114* 258*  BUN 39*  --  39* 42* 40*  CREATININE 1.31* 1.28* 1.06 1.24* 1.27*  CALCIUM 10.7*  --  10.6* 10.5 10.3   Liver Function Tests:  Recent Labs Lab 08/28/14 1944 08/29/14 0407  AST 17 15  ALT 28 24  ALKPHOS 67 62  BILITOT 1.0 1.2  PROT 8.1 7.7  ALBUMIN 4.4 3.9   No results for input(s): LIPASE, AMYLASE in the last 168 hours. No results for input(s): AMMONIA in the last 168 hours. CBC:  Recent Labs Lab 08/28/14 1932 08/28/14 2208  08/29/14 0407 08/30/14 0600  WBC 15.8* 15.6* 11.6* 7.0  NEUTROABS 13.9*  --   --  5.7  HGB 12.0 10.9* 11.4* 9.9*  HCT 38.2 34.9* 35.5* 31.2*  MCV 83.4 82.5 81.6 83.2  PLT 238 247 230 232   Cardiac Enzymes:  Recent Labs Lab 08/28/14 1932 08/28/14 2208 08/29/14 0407 08/29/14 1007  TROPONINI 0.03 0.03 0.03 0.03   BNP: BNP (last 3 results) No results for input(s): PROBNP in the last 8760 hours. CBG:  Recent Labs Lab 08/29/14 1743 08/29/14 2100 08/30/14 0730 08/30/14 1057 08/30/14 1630  GLUCAP 117* 265* 93 160* 254*       Signed:  Nita Sells  [facility] 08/30/2014, 6:17 PM

## 2014-08-30 NOTE — Consult Note (Signed)
Consult requested by: [facility] Consult requested for respiratory failure:  HPI: This is an 81 year old who has multiple medical problems as listed below. She has obstructive sleep apnea and pulmonary hypertension. She has heart failure which apparently is diastolic. She had been in her usual state of fairly poor health at home and came to the emergency department with shortness of breath on minimal exertion. She says she feels better. She's not coughing. She has no other new complaints.  Past Medical History  Diagnosis Date  . Hypertension   . Nephrolithiasis   . GERD (gastroesophageal reflux disease)   . Arthritis   . Psoriasis   . Morbid obesity   . Mitral regurgitation   . Hyperlipidemia   . Sleep apnea     uses cpap  . CHF (congestive heart failure)     Diastolic. EF 55-65% by cath 10/11/11  . Diabetes mellitus     insulin dependent  . Acute renal insufficiency     09/2011. not on ACEI secondary to this (also has renal artery stenosis)  . Peripheral vascular disease     severe right renal artery stenosis and left SFA stenosis by PV angio 09/2011, treated medically  . Cellulitis   . CAD (coronary artery disease)     NSTEMI 09/2011 felt poor candidate for CABG, instead s/p PTCA/DES to mid RCA 10/17/11  . Chronic respiratory failure   . Pulmonary hypertension   . Obstructive sleep apnea   . CKD (chronic kidney disease) stage 2, GFR 60-89 ml/min 07/11/2013  . Vitamin D deficiency      Family History  Problem Relation Age of Onset  . Psoriasis Brother      History   Social History  . Marital Status: Divorced    Spouse Name: N/A    Number of Children: N/A  . Years of Education: N/A   Social History Main Topics  . Smoking status: Never Smoker   . Smokeless tobacco: Never Used  . Alcohol Use: No  . Drug Use: No  . Sexual Activity: No   Other Topics Concern  . None   Social History Narrative     ROS: She denies chest pain. She is complaining of back pain.  She says her breathing is better. Otherwise per the history and physical    Objective: Vital signs in last 24 hours: Temp:  [97.9 F (36.6 C)-98 F (36.7 C)] 98 F (36.7 C) (01/02 0649) Pulse Rate:  [67-70] 70 (01/02 0649) Resp:  [16-24] 22 (01/02 0649) BP: (150-160)/(43-49) 150/49 mmHg (01/02 0649) SpO2:  [98 %-100 %] 99 % (01/02 0649) Weight:  [91 kg (200 lb 9.9 oz)-92.3 kg (203 lb 7.8 oz)] 92.3 kg (203 lb 7.8 oz) (01/02 0649) Weight change: 0.281 kg (9.9 oz) Last BM Date: 08/29/14  Intake/Output from previous day: 01/01 0701 - 01/02 0700 In: 31 [P.O.:720] Out: 1900 [Urine:1900]  PHYSICAL EXAM She is mildly obese. She looks comfortable. HEENT examination is generally unremarkable. Her neck is supple without masses. Chest is clear no wheezes now. Her heart is regular I don't hear a gallop. Her abdomen is soft without masses central nervous system examination is grossly intact  Lab Results: Basic Metabolic Panel:  Recent Labs  08/29/14 0407 08/30/14 0600  NA 135 134*  K 3.5 4.0  CL 99 97  CO2 27 29  GLUCOSE 96 114*  BUN 39* 42*  CREATININE 1.06 1.24*  CALCIUM 10.6* 10.5   Liver Function Tests:  Recent Labs  08/28/14 1944 08/29/14  0407  AST 17 15  ALT 28 24  ALKPHOS 67 62  BILITOT 1.0 1.2  PROT 8.1 7.7  ALBUMIN 4.4 3.9   No results for input(s): LIPASE, AMYLASE in the last 72 hours. No results for input(s): AMMONIA in the last 72 hours. CBC:  Recent Labs  08/28/14 1932  08/29/14 0407 08/30/14 0600  WBC 15.8*  < > 11.6* 7.0  NEUTROABS 13.9*  --   --  5.7  HGB 12.0  < > 11.4* 9.9*  HCT 38.2  < > 35.5* 31.2*  MCV 83.4  < > 81.6 83.2  PLT 238  < > 230 232  < > = values in this interval not displayed. Cardiac Enzymes:  Recent Labs  08/28/14 2208 08/29/14 0407 08/29/14 1007  TROPONINI 0.03 0.03 0.03   BNP: No results for input(s): PROBNP in the last 72 hours. D-Dimer: No results for input(s): DDIMER in the last 72 hours. CBG:  Recent  Labs  08/29/14 0729 08/29/14 0803 08/29/14 1131 08/29/14 1743 08/29/14 2100 08/30/14 0730  GLUCAP 63* 97 213* 117* 265* 93   Hemoglobin A1C: No results for input(s): HGBA1C in the last 72 hours. Fasting Lipid Panel: No results for input(s): CHOL, HDL, LDLCALC, TRIG, CHOLHDL, LDLDIRECT in the last 72 hours. Thyroid Function Tests:  Recent Labs  08/28/14 2208  TSH 3.435   Anemia Panel: No results for input(s): VITAMINB12, FOLATE, FERRITIN, TIBC, IRON, RETICCTPCT in the last 72 hours. Coagulation: No results for input(s): LABPROT, INR in the last 72 hours. Urine Drug Screen: Drugs of Abuse  No results found for: LABOPIA, COCAINSCRNUR, LABBENZ, AMPHETMU, THCU, LABBARB  Alcohol Level: No results for input(s): ETH in the last 72 hours. Urinalysis:  Recent Labs  08/29/14 0447  COLORURINE YELLOW  LABSPEC 1.010  PHURINE 5.5  GLUCOSEU NEGATIVE  HGBUR NEGATIVE  BILIRUBINUR NEGATIVE  KETONESUR NEGATIVE  PROTEINUR NEGATIVE  UROBILINOGEN 0.2  NITRITE NEGATIVE  LEUKOCYTESUR NEGATIVE   Misc. Labs   ABGS: No results for input(s): PHART, PO2ART, TCO2, HCO3 in the last 72 hours.  Invalid input(s): PCO2   MICROBIOLOGY: No results found for this or any previous visit (from the past 240 hour(s)).  Studies/Results: Dg Chest 2 View  08/28/2014   CLINICAL DATA:  Dyspnea.  EXAM: CHEST  2 VIEW  COMPARISON:  July 11, 2014.  FINDINGS: Stable cardiomegaly. No pneumothorax or pleural effusion is noted. Stable interstitial densities are noted in both lung bases most consistent with scarring, but superimposed edema cannot be excluded. Bony thorax is intact.  IMPRESSION: Stable cardiomegaly. Stable bibasilar interstitial densities most consistent with scarring, but superimposed pulmonary edema cannot be excluded.   Electronically Signed   By: Sabino Dick M.D.   On: 08/28/2014 20:16    Medications:  Prior to Admission:  Prescriptions prior to admission  Medication Sig Dispense  Refill Last Dose  . acitretin (SORIATANE) 10 MG capsule Take 10 mg by mouth daily with supper.   Taking  . amLODipine (NORVASC) 10 MG tablet Take 10 mg by mouth daily.   Taking  . aspirin 81 MG tablet Take 81 mg by mouth daily.   Taking  . atorvastatin (LIPITOR) 20 MG tablet Take 20 mg by mouth at bedtime.    Taking  . bisacodyl (DULCOLAX) 10 MG suppository Place 1 suppository (10 mg total) rectally 2 (two) times daily. 12 suppository 0 Taking  . clobetasol (OLUX) 0.05 % topical foam Apply to scalp daily as needed   Taking  . docusate sodium 100  MG CAPS Take 200 mg by mouth 2 (two) times daily. 10 capsule 0 Taking  . feeding supplement, GLUCERNA SHAKE, (GLUCERNA SHAKE) LIQD Take 237 mLs by mouth daily after supper. 12 Can 2 Taking  . fexofenadine (ALLEGRA) 180 MG tablet Take 180 mg by mouth daily. During allergy seasons.   Taking  . folic acid (FOLVITE) 1 MG tablet Take 1 mg by mouth daily.   Taking  . furosemide (LASIX) 40 MG tablet TAKE 1 TABLET BY MOUTH DAILY. 30 tablet 6 Taking  . gabapentin (NEURONTIN) 100 MG capsule Take 2 capsules (200 mg total) by mouth 2 (two) times daily. 120 capsule 0 Taking  . hydrALAZINE (APRESOLINE) 100 MG tablet TAKE 1 TABLET BY MOUTH THREE TIMES DAILY 90 tablet 3 Taking  . HYDROcodone-acetaminophen (NORCO) 7.5-325 MG per tablet Take 1 tablet by mouth every 6 (six) hours as needed for moderate pain.   Taking  . insulin aspart (NOVOLOG) 100 UNIT/ML injection SLIDING SCALE 0-9 Units, Injected subcutaneous, 3 times daily with meals. CBG < 70: implement hypoglycemia protocol CBG 70 - 120: 0 units CBG 121 - 150: 1 unit CBG 151 - 200: 2 units CBG 201 - 250: 3 units CBG 251 - 300: 5 units CBG 301 - 350: 7 units CBG 351 - 400: 9 units CBG > 400: call MD (Patient taking differently: Inject 0-9 Units into the skin 3 (three) times daily with meals. SLIDING SCALE 0-9 Units, Injected subcutaneous, 3 times daily with meals. CBG < 70: implement hypoglycemia protocol CBG 70  - 120: 0 units CBG 121 - 150: 1 unit CBG 151 - 200: 2 units CBG 201 - 250: 3 units CBG 251 - 300: 5 units CBG 301 - 350: 7 units CBG 351 - 400: 9 units CBG > 400: call MD)   Taking  . insulin glargine (LANTUS) 100 UNIT/ML injection Inject 26 Units into the skin 2 (two) times daily.   Taking  . Linaclotide (LINZESS) 145 MCG CAPS capsule Take 145 mcg by mouth daily as needed.   Taking  . methocarbamol (ROBAXIN) 750 MG tablet Take 1 po QID for muscle pain 40 tablet 0 Taking  . methotrexate (RHEUMATREX) 2.5 MG tablet Take 7.5 mg by mouth every Wednesday. Caution:Chemotherapy. Protect from light. Takes on Wednesday.   Taking  . montelukast (SINGULAIR) 10 MG tablet Take 10 mg by mouth daily.   Taking  . NITROSTAT 0.4 MG SL tablet PLACE 1 TABLET UNDER THE TONGUE EVERY 5 MINUTES AS NEEDED FOR CHEST PAIN. (Patient not taking: Reported on 08/12/2014) 25 tablet 3 Not Taking  . pantoprazole (PROTONIX) 40 MG tablet Take 40 mg by mouth daily.    Taking  . polyethylene glycol (MIRALAX / GLYCOLAX) packet Take 17 g by mouth daily as needed for moderate constipation.   Taking  . ranolazine (RANEXA) 500 MG 12 hr tablet Take 1 tablet (500 mg total) by mouth 2 (two) times daily. 28 tablet 0 Taking  . senna (SENOKOT) 8.6 MG TABS tablet Take 1 tablet (8.6 mg total) by mouth 2 (two) times daily. 120 each 0 Taking  . spironolactone (ALDACTONE) 25 MG tablet Take 1 tablet (25 mg total) by mouth daily. 30 tablet 1 Taking   Scheduled: . acitretin  10 mg Oral Q supper  . amLODipine  10 mg Oral Daily  . aspirin EC  81 mg Oral Daily  . atorvastatin  20 mg Oral QHS  . bisacodyl  10 mg Rectal BID  . docusate sodium  200 mg  Oral BID  . enoxaparin (LOVENOX) injection  40 mg Subcutaneous Q24H  . feeding supplement (GLUCERNA SHAKE)  237 mL Oral QPC supper  . folic acid  1 mg Oral Daily  . furosemide  40 mg Intravenous Daily  . gabapentin  200 mg Oral BID  . hydrALAZINE  100 mg Oral TID  . insulin aspart  0-15 Units  Subcutaneous TID WC  . insulin aspart  0-5 Units Subcutaneous QHS  . insulin glargine  26 Units Subcutaneous BID  . loratadine  10 mg Oral Daily  . montelukast  10 mg Oral Daily  . pantoprazole  40 mg Oral Daily  . ranolazine  500 mg Oral BID  . senna  1 tablet Oral BID  . sodium chloride  3 mL Intravenous Q12H  . spironolactone  25 mg Oral Daily   Continuous:  HUO:[id number], methocarbamol, nitroGLYCERIN, ondansetron **OR** ondansetron (ZOFRAN) IV, polyethylene glycol  Assesment: She has multifactorial respiratory failure which I think is mostly cardiac in nature. She has pulmonary hypertension. Apparently she has been on methotrexate in the past so she could be having some methotrexate induced pulmonary injury. Pulmonary function testing with lung volumes and DLCO will help [place] to decide about that. I think a short course of steroids may be helpful to see what sort of improvement she gets. Active Problems:   Diabetes mellitus   CAD (coronary artery disease)   Acute diastolic congestive heart failure   Essential hypertension   Chronic kidney disease    Plan: She will need complete pulmonary function testing. I will go ahead and start steroids empirically to see if she gets improvement    LOS: 2 days   Irving Lubbers L 08/30/2014, 10:59 AM

## 2014-08-30 NOTE — Progress Notes (Signed)
Theresa Mclean ZGY:[id number] DOB: 09-10-1928 DOA: 08/28/2014 PCP: Glenda Chroman., MD  Brief narrative: 81 y/o ? h/o rest tremor, horz nystagmus, bradycardia, Chronic diastolic dysfucntion with mild AoS, htn,  History CAD status post PCI to RCA 2013 -repeat nuclear chest normal 11/14 [not a candidate for beta blocker secondary to bradycardia] , OSA on CPAP, peripheral vascular disease with severe right renal artery stenosis status post left SFA stenosis , hyperlipidemia , type 2 diabetes mellitus with complication of diabetic neuropathy , chronic cor pulmonale /severe pulmonary hypertension on catheter 2013 on 3 L of oxygen.   Baseline weight 07/08/14  ~ 213 pounds  admitted  12/ 31/15 for acute hypoxic respiratory failure likely secondary to diastolic his function combined with cor pulmonale  Past medical history-As per Problem list Chart reviewed as below-  reviewed  Consultants:   none  Procedures:   none  Antibiotics:   none   Subjective   feels better No SOB Had back pain today and feels better now No dysuria No melena No hematemesis Eating and drinking Toe on L feels fine   Objective    Interim History:  none  Telemetry:  sinus bradycardia   Objective: Filed Vitals:   08/29/14 2055 08/29/14 2321 08/30/14 0649 08/30/14 1103  BP: 160/45  150/49 148/40  Pulse: 68 68 70 68  Temp: 98 F (36.7 C)  98 F (36.7 C) 98.2 F (36.8 C)  TempSrc: Oral  Oral   Resp: 18 24 22    Height:      Weight:   92.3 kg (203 lb 7.8 oz)   SpO2: 98% 98% 99%     Intake/Output Summary (Last 24 hours) at 08/30/14 1208 Last data filed at 08/29/14 1855  Gross per 24 hour  Intake    480 ml  Output   1550 ml  Net  -1070 ml    Exam:  General:  EOMI NCAT Cardiovascular:  S1-S2 no murmur rub or gallop Respiratory:  Clinically clear with reduced crackles posteriorly Abdomen:  Soft nontender nondistended no rebound, Skin  Great toe on left foot appears to be read but not  swollen not infected appearing and stable.  LE's not swollen Neuro grossly intact moving all 4 limbs equally no  Data Reviewed: Basic Metabolic Panel:  Recent Labs Lab 08/28/14 1944 08/28/14 2208 08/29/14 0407 08/30/14 0600  NA 136  --  135 134*  K 4.5  --  3.5 4.0  CL 103  --  99 97  CO2 24  --  27 29  GLUCOSE 175*  --  96 114*  BUN 39*  --  39* 42*  CREATININE 1.31* 1.28* 1.06 1.24*  CALCIUM 10.7*  --  10.6* 10.5   Liver Function Tests:  Recent Labs Lab 08/28/14 1944 08/29/14 0407  AST 17 15  ALT 28 24  ALKPHOS 67 62  BILITOT 1.0 1.2  PROT 8.1 7.7  ALBUMIN 4.4 3.9   No results for input(s): LIPASE, AMYLASE in the last 168 hours. No results for input(s): AMMONIA in the last 168 hours. CBC:  Recent Labs Lab 08/28/14 1932 08/28/14 2208 08/29/14 0407 08/30/14 0600  WBC 15.8* 15.6* 11.6* 7.0  NEUTROABS 13.9*  --   --  5.7  HGB 12.0 10.9* 11.4* 9.9*  HCT 38.2 34.9* 35.5* 31.2*  MCV 83.4 82.5 81.6 83.2  PLT 238 247 230 232   Cardiac Enzymes:  Recent Labs Lab 08/28/14 1932 08/28/14 2208 08/29/14 0407 08/29/14 1007  TROPONINI 0.03 0.03 0.03 0.03  BNP: Invalid input(s): POCBNP CBG:  Recent Labs Lab 08/29/14 1131 08/29/14 1743 08/29/14 2100 08/30/14 0730 08/30/14 1057  GLUCAP 213* 117* 265* 93 160*    No results found for this or any previous visit (from the past 240 hour(s)).   Studies:              All Imaging reviewed and is as per above notation   Scheduled Meds: . acitretin  10 mg Oral Q supper  . amLODipine  10 mg Oral Daily  . aspirin EC  81 mg Oral Daily  . atorvastatin  20 mg Oral QHS  . bisacodyl  10 mg Rectal BID  . docusate sodium  200 mg Oral BID  . enoxaparin (LOVENOX) injection  40 mg Subcutaneous Q24H  . feeding supplement (GLUCERNA SHAKE)  237 mL Oral QPC supper  . folic acid  1 mg Oral Daily  . furosemide  40 mg Oral Daily  . gabapentin  200 mg Oral BID  . hydrALAZINE  100 mg Oral TID  . insulin aspart  0-15 Units  Subcutaneous TID WC  . insulin aspart  0-5 Units Subcutaneous QHS  . insulin glargine  26 Units Subcutaneous BID  . loratadine  10 mg Oral Daily  . montelukast  10 mg Oral Daily  . pantoprazole  40 mg Oral Daily  . predniSONE  40 mg Oral Q breakfast  . ranolazine  500 mg Oral BID  . senna  1 tablet Oral BID  . sodium chloride  3 mL Intravenous Q12H  . spironolactone  25 mg Oral Daily   Continuous Infusions:    Assessment/Plan:  1.  acute multifactorial respiratory failure - secondary to diastolic his function , aortic stenosis, obstructive sleep apnea, chronic cor pulmonale.  Chronic O2 use ~1.5 yrs [her Cardiac Md Rx it for her as OP-Pulmonology Dr. Marina Gravel in [facility] ] 2.  Acute decompensated diastolic dysfunction last echocardiogram EF 60-65% , PA peak pressure 39 mmHg with mild aortic stenosis- Continue diuresis with Aldactone 25 daily ,  Lasix dose cut back from 80 twice a day IV  Home dose of medication is 40 mg once daily  3.  acute kidney injury- repeat basic metabolic panel in a.m. , BUN/creatinine baseline 39/ 1.31-->42/1.24.  Will monitor today and rpt Bmet 1500 4.  diabetic neuropathy with mild hyperglycemia and diabetes- Lantus 26 units daily, moderate sliding scale coverage- blood sugars appear to be 93 and 160  Continue gabapentin 200 2 capsules twice a day 5.  hypertension-continue amlodipine 10 daily , hydralazine at 1 3 times a day 100 mg , Aldactone 25 daily  6.  rheumatoid arthritis-continue methotrexate 7.5 every Wednesday- caution regarding this medication- can affect pulmonary system and cause methotrexate lung -OP PFTs are pending and appreciate Dr. Luan Pulling input 7.  COPD- continue Singulair 10 mg daily  , discontinue fexofenadine as can raise blood pressure 8.  hyperlipidemia continue atorvastatin 20 daily at bedtime 9.  History CAD -continue 81 mg aspirin daily  10.  constipation-continue linzess 145 g daily, MiraLAX 17 g daily , senna one tablet twice a day,  Dulcolax 10 mg suppositories when necessary 11.  reflux-continue pantoprazole 40 daily 12.  leukocytosis-unclear etiology -monitor -no fever no chills but chest x-ray showed only pulmonary edema versus scarring. UA was negative 13.  resting tremor - follow-up with neurology as an outpatient  PT to clear for safe d/c home.  Has HH at home and an Aide every day at home Uses walker  at baseline  Code Status:  full Family Communication: Donnah, Levert Son [phone]  [phone]  NO answer, left VM fior him ansd will update later Disposition Plan: inpatient pending resolution    Verneita Griffes, MD  [facility] Pager [phone] 08/30/2014, 12:08 PM    LOS: 2 days

## 2014-08-30 NOTE — Progress Notes (Signed)
Patient and son discharged with instructions, prescription, and care notes.  Verbalized understanding via teach back.  IV was removed and the site was WNL. Patient voiced no further complaints or concerns at the time of discharge.  Appointments scheduled per instructions.  Patient left the floor via w/c with staff and family in stable condition. Patient had home O2 at bedside but wanted to put on once in the car.

## 2014-08-30 NOTE — Evaluation (Signed)
Physical Therapy Evaluation Patient Details Name: Theresa Mclean MRN: [id number] DOB: [birth year] Today's Date: 08/30/2014   History of Present Illness  This is an 81 year old lady who has a history of coronary artery disease and a history of heart failure presents with a 12 hour history of weakness associated with dyspnea on very minimal exertion. She denies any chest pain. She relates that she has been feeling weak for little bit longer, perhaps a few more days and her legs have been swelling. She has been compliant with all her medications. She denies any palpitations or limb weakness specifically. Her last echocardiogram, done in November of this year, shows an ejection fraction of 60-65% with mild aortic stenosis. There was grade 1 diastolic dysfunction present. She is diabetic, hypertensive and obese.   Clinical Impression  PT states she very rarely ambulates anymore mainly transfers with the assist of her son to a wheelchair to go from room to room at her home.      Follow Up Recommendations Home health PT    Equipment Recommendations  None recommended by PT    Recommendations for Other Services       Precautions / Restrictions Precautions Precautions: None Restrictions Weight Bearing Restrictions: No      Mobility  Bed Mobility                  Transfers Overall transfer level: Needs assistance   Transfers: Sit to/from Stand;Stand Pivot Transfers Sit to Stand: Mod assist Stand pivot transfers: Min assist          Ambulation/Gait Ambulation/Gait assistance: Min guard Ambulation Distance (Feet): 2 Feet Assistive device: Rolling walker (2 wheeled)     Gait velocity interpretation: <1.8 ft/sec, indicative of risk for recurrent falls General Gait Details: Pt states knees are to painful to ambulate        Balance Overall balance assessment: Needs assistance Sitting-balance support: Bilateral upper extremity supported Sitting balance-Leahy Scale: Good        Standing balance-Leahy Scale: Poor                               Pertinent Vitals/Pain Pain Assessment: 0-10 Pain Score: 8  Pain Location: B knees with weight bearing  Pain Descriptors / Indicators: Aching Pain Intervention(s): Repositioned    Home Living Family/patient expects to be discharged to:: Private residence Living Arrangements: Children Available Help at Discharge: Family Type of Home: House Home Access: Level entry     Home Layout: One level Home Equipment: Walker - standard;Shower seat      Prior Function Level of Independence: Needs assistance   Gait / Transfers Assistance Needed: Pt states that she mainly transfers to wheelchair or commode or bed rarely walks.  PT states she normally needs assistance coming from sit to stand   ADL's / Homemaking Assistance Needed: assists with all ADL's           Extremity/Trunk Assessment               Lower Extremity Assessment: Generalized weakness            Cognition Arousal/Alertness: Awake/alert Behavior During Therapy: WFL for tasks assessed/performed Overall Cognitive Status: Within Functional Limits for tasks assessed                               Assessment/Plan    PT Assessment Patient needs continued PT  services  PT Diagnosis Generalized weakness   PT Problem List Decreased strength;Decreased activity tolerance;Decreased balance;Pain  PT Treatment Interventions Gait training;Functional mobility training;Therapeutic activities;Therapeutic exercise   PT Goals (Current goals can be found in the Care Plan section) Acute Rehab PT Goals PT Goal Formulation: With patient    Frequency Min 3X/week           End of Session Equipment Utilized During Treatment: Gait belt;Oxygen Activity Tolerance: Patient limited by pain Patient left: in chair;with call bell/phone within reach      Functional Limitation: Mobility: Walking and moving around Mobility: Walking  and Moving Around Current Status [phone]): At least 80 percent but less than 100 percent impaired, limited or restricted Mobility: Walking and Moving Around Goal Status [phone]): At least 80 percent but less than 100 percent impaired, limited or restricted Mobility: Walking and Moving Around Discharge Status [phone]): At least 80 percent but less than 100 percent impaired, limited or restricted    Time: 1300-1332 PT Time Calculation (min) (ACUTE ONLY): 32 min   Charges:         PT G Codes:   PT G-Codes **NOT FOR INPATIENT CLASS** Functional Limitation: Mobility: Walking and moving around Mobility: Walking and Moving Around Current Status ([id number]): At least 80 percent but less than 100 percent impaired, limited or restricted Mobility: Walking and Moving Around Goal Status [phone]): At least 80 percent but less than 100 percent impaired, limited or restricted Mobility: Walking and Moving Around Discharge Status [phone]): At least 80 percent but less than 100 percent impaired, limited or restricted    RUSSELL,CINDY PT 08/30/2014, 1:33 PM

## 2014-09-01 NOTE — Telephone Encounter (Signed)
Left another message on machine for patient to call back.

## 2014-09-02 NOTE — Care Management Utilization Note (Signed)
UR completed 

## 2014-09-05 ENCOUNTER — Ambulatory Visit (INDEPENDENT_AMBULATORY_CARE_PROVIDER_SITE_OTHER): Payer: Medicare Other | Admitting: Cardiovascular Disease

## 2014-09-05 ENCOUNTER — Encounter: Payer: Self-pay | Admitting: Cardiovascular Disease

## 2014-09-05 VITALS — BP 160/58 | HR 73 | Ht 64.0 in

## 2014-09-05 DIAGNOSIS — Z87898 Personal history of other specified conditions: Secondary | ICD-10-CM

## 2014-09-05 DIAGNOSIS — R0602 Shortness of breath: Secondary | ICD-10-CM

## 2014-09-05 DIAGNOSIS — I25119 Atherosclerotic heart disease of native coronary artery with unspecified angina pectoris: Secondary | ICD-10-CM

## 2014-09-05 DIAGNOSIS — E785 Hyperlipidemia, unspecified: Secondary | ICD-10-CM

## 2014-09-05 DIAGNOSIS — I10 Essential (primary) hypertension: Secondary | ICD-10-CM

## 2014-09-05 DIAGNOSIS — I739 Peripheral vascular disease, unspecified: Secondary | ICD-10-CM

## 2014-09-05 DIAGNOSIS — N189 Chronic kidney disease, unspecified: Secondary | ICD-10-CM

## 2014-09-05 DIAGNOSIS — Z9289 Personal history of other medical treatment: Secondary | ICD-10-CM

## 2014-09-05 DIAGNOSIS — I5032 Chronic diastolic (congestive) heart failure: Secondary | ICD-10-CM

## 2014-09-05 DIAGNOSIS — G4733 Obstructive sleep apnea (adult) (pediatric): Secondary | ICD-10-CM

## 2014-09-05 MED ORDER — CHLORTHALIDONE 25 MG PO TABS: 25.0000 mg | ORAL_TABLET | Freq: Every day | ORAL | Status: DC

## 2014-09-05 NOTE — Patient Instructions (Addendum)
Your physician recommends that you schedule a follow-up appointment in: 1 month with Dr.Koneswaran    START Chlorthalidone 25 mg daily, take first dose today   Please get blood work on Monday 09/08/14  (BMET)     Thank you for choosing [facility] !

## 2014-09-05 NOTE — Progress Notes (Signed)
Patient ID: Theresa Mclean, female   DOB: Mar 22, 1929, 81 y.o.   MRN: [id number]      SUBJECTIVE: The patient presents for post hospital follow-up after she was recently hospitalized for acute multifactorial hypoxic respiratory failure presumed primarily secondary to acute on chronic diastolic heart failure superimposed on mild aortic stenosis, obstructive sleep apnea, and "chronic cor pulmonale". She was given a steroid burst for lung disease and diuresed with intravenous Lasix. BNP on 12/31 was 1440 and reduced to 994 on 1/1. CXR on 12/31 demonstrated stable by basilar interstitial densities most consistent with scarring, but superimposed pulmonary edema could not be excluded. MRI brain on 12/30 showed chronic microvascular ischemia. WBC was in 15 range on admission, with no clear signs of infection.  Her son [name] has been recording daily weights and BP's. Wt on 1/6 was 189.6, down to 187.4 on 1/7. The scale then broke and he is going to purchase a new one. He has been adding sea salt to her food but does not use canned foods. He says her BP stays between 160-180/80, but was down to 140/80 this morning.  1/2: Na 132, BUN 40, SCr 1.27  She is being scheduled for outpatient pulmonary function testing by Dr. Luan Pulling to determine if she has methotrexate-induced lung injury.  She says she feels much better than when she did when she was hospitalized. Denies chest pain.  Echo in 11/15 showed normal RV size and function with mild pulmonary hypertension.   Children: Lives with son [name], also my patient) and has daughter Thayer Headings).   Review of Systems: As per "subjective", otherwise negative.  No Known Allergies  Current Outpatient Prescriptions  Medication Sig Dispense Refill  . cephALEXin (KEFLEX) 500 MG capsule Take 500 mg by mouth 3 (three) times daily.    Marland Kitchen acitretin (SORIATANE) 10 MG capsule Take 10 mg by mouth daily with supper.    Marland Kitchen amLODipine (NORVASC) 10 MG tablet Take 10 mg  by mouth daily.    Marland Kitchen aspirin 81 MG tablet Take 81 mg by mouth daily.    Marland Kitchen atorvastatin (LIPITOR) 20 MG tablet Take 20 mg by mouth at bedtime.     . bisacodyl (DULCOLAX) 10 MG suppository Place 1 suppository (10 mg total) rectally 2 (two) times daily. 12 suppository 0  . clobetasol (OLUX) 0.05 % topical foam Apply to scalp daily as needed    . docusate sodium 100 MG CAPS Take 200 mg by mouth 2 (two) times daily. 10 capsule 0  . feeding supplement, GLUCERNA SHAKE, (GLUCERNA SHAKE) LIQD Take 237 mLs by mouth daily after supper. 12 Can 2  . folic acid (FOLVITE) 1 MG tablet Take 1 tablet (1 mg total) by mouth daily. 30 tablet 0  . furosemide (LASIX) 40 MG tablet TAKE 1 TABLET BY MOUTH DAILY. 30 tablet 6  . gabapentin (NEURONTIN) 100 MG capsule Take 2 capsules (200 mg total) by mouth 2 (two) times daily. 120 capsule 0  . hydrALAZINE (APRESOLINE) 100 MG tablet TAKE 1 TABLET BY MOUTH THREE TIMES DAILY 90 tablet 3  . HYDROcodone-acetaminophen (NORCO) 7.5-325 MG per tablet Take 1 tablet by mouth every 6 (six) hours as needed for moderate pain.    Marland Kitchen insulin aspart (NOVOLOG) 100 UNIT/ML injection SLIDING SCALE 0-9 Units, Injected subcutaneous, 3 times daily with meals. CBG < 70: implement hypoglycemia protocol CBG 70 - 120: 0 units CBG 121 - 150: 1 unit CBG 151 - 200: 2 units CBG 201 - 250: 3 units CBG 251 -  300: 5 units CBG 301 - 350: 7 units CBG 351 - 400: 9 units CBG > 400: call MD (Patient taking differently: Inject 0-9 Units into the skin 3 (three) times daily with meals. SLIDING SCALE 0-9 Units, Injected subcutaneous, 3 times daily with meals. CBG < 70: implement hypoglycemia protocol CBG 70 - 120: 0 units CBG 121 - 150: 1 unit CBG 151 - 200: 2 units CBG 201 - 250: 3 units CBG 251 - 300: 5 units CBG 301 - 350: 7 units CBG 351 - 400: 9 units CBG > 400: call MD)    . insulin aspart (NOVOLOG) 100 UNIT/ML injection Inject 0-15 Units into the skin 3 (three) times daily with meals. 10 mL 11  .  insulin aspart (NOVOLOG) 100 UNIT/ML injection Inject 0-5 Units into the skin at bedtime. 10 mL 11  . insulin glargine (LANTUS) 100 UNIT/ML injection Inject 60 Units into the skin 2 (two) times daily.     . Linaclotide (LINZESS) 145 MCG CAPS capsule Take 145 mcg by mouth daily as needed.    . methocarbamol (ROBAXIN) 750 MG tablet Take 1 po QID for muscle pain 40 tablet 0  . methotrexate (RHEUMATREX) 2.5 MG tablet Take 7.5 mg by mouth every Wednesday. Caution:Chemotherapy. Protect from light. Takes on Wednesday.    . montelukast (SINGULAIR) 10 MG tablet Take 10 mg by mouth daily.    . pantoprazole (PROTONIX) 40 MG tablet Take 40 mg by mouth daily.     . polyethylene glycol (MIRALAX / GLYCOLAX) packet Take 17 g by mouth daily as needed for moderate constipation.    . ranolazine (RANEXA) 500 MG 12 hr tablet Take 1 tablet (500 mg total) by mouth 2 (two) times daily. 28 tablet 0  . senna (SENOKOT) 8.6 MG TABS tablet Take 1 tablet (8.6 mg total) by mouth 2 (two) times daily. 120 each 0  . spironolactone (ALDACTONE) 25 MG tablet Take 1 tablet (25 mg total) by mouth daily. 30 tablet 1   No current facility-administered medications for this visit.    Past Medical History  Diagnosis Date  . Hypertension   . Nephrolithiasis   . GERD (gastroesophageal reflux disease)   . Arthritis   . Psoriasis   . Morbid obesity   . Mitral regurgitation   . Hyperlipidemia   . Sleep apnea     uses cpap  . CHF (congestive heart failure)     Diastolic. EF 55-65% by cath 10/11/11  . Diabetes mellitus     insulin dependent  . Acute renal insufficiency     09/2011. not on ACEI secondary to this (also has renal artery stenosis)  . Peripheral vascular disease     severe right renal artery stenosis and left SFA stenosis by PV angio 09/2011, treated medically  . Cellulitis   . CAD (coronary artery disease)     NSTEMI 09/2011 felt poor candidate for CABG, instead s/p PTCA/DES to mid RCA 10/17/11  . Chronic respiratory  failure   . Pulmonary hypertension   . Obstructive sleep apnea   . CKD (chronic kidney disease) stage 2, GFR 60-89 ml/min 07/11/2013  . Vitamin D deficiency     Past Surgical History  Procedure Laterality Date  . Acromio-clavicular joint repair  2011  . Abdominal hysterectomy    . Eye surgery      cataract  OD  . Lower extremity angiogram N/A 10/11/2011    Procedure: LOWER EXTREMITY ANGIOGRAM;  Surgeon: Sherren Mocha, MD;  Location: [facility] CATH LAB;  Service: Cardiovascular;  Laterality: N/A;  . Left and right heart catheterization with coronary angiogram N/A 10/11/2011    Procedure: LEFT AND RIGHT HEART CATHETERIZATION WITH CORONARY ANGIOGRAM;  Surgeon: Sherren Mocha, MD;  Location: [facility] CATH LAB;  Service: Cardiovascular;  Laterality: N/A;  . Percutaneous coronary stent intervention (pci-s) N/A 10/17/2011    Procedure: PERCUTANEOUS CORONARY STENT INTERVENTION (PCI-S);  Surgeon: Burnell Blanks, MD;  Location: [facility] C[facility] LAB;  Service: Cardiovascular;  Laterality: N/A;    History   Social History  . Marital Status: Divorced    Spouse Name: N/A    Number of Children: N/A  . Years of Education: N/A   Occupational History  . Not on file.   Social History Main Topics  . Smoking status: Never Smoker   . Smokeless tobacco: Never Used  . Alcohol Use: No  . Drug Use: No  . Sexual Activity: No   Other Topics Concern  . Not on file   Social History Narrative     Filed Vitals:   09/05/14 1128  BP: 160/58  Pulse: 73  Height: 5\' 4"  (1.626 m)  SpO2: 99%   Unable to stand on scale to weigh  PHYSICAL EXAM General: NAD, using oxygen by nasal cannula Neck: No JVD, no thyromegaly.  Lungs: Clear to auscultation bilaterally, no wheezes or rales. CV: Nondisplaced PMI. Regular rate and rhythm, normal S1/S2, no S3/S4, soft I/VI systolic murmur along left sternal border. No pretibial or periankle edema. No carotid bruit.  Abdomen: Soft, obese, nontender,no distention.   Neurologic: Alert and oriented. Psych: Normal affect.  Skin: Normal. Musculoskeletal: No gross deformities.  ECG: Most recent ECG reviewed.    ASSESSMENT AND PLAN:  1. Chronic diastolic heart failure: Compensated and euvolemic. Continue Lasix 40 mg daily, along with hydralazine and spironolactone. BP still elevated, so will add chlorthalidone 25 mg and check BMET on Monday, 09/08/14. Echo in 11/15 showed normal RV size and function with mild pulmonary hypertension.  2. Malignant hypertension: BP still elevated, so will add chlorthalidone 25 mg and check BMET on Monday, 09/08/14.  She is not on an ACEI reportedly because of severe right renal artery stenosis. Continue amlodipine 10 mg , hydralazine 100 mg 3 times a day and spironolactone 25 mg. Informed not to consume sea salt as this is also likely contributing.  3. CAD: Resting nuclear images in 06/2014 did not show ischemia, and showed thinning in the basal anterolateral wall and inferolateral wall.  Has h/o PCI to RCA in 2013, with cath demonstrating 2-vessel disease in the RCA and with severe left circumflex disease (serial 90-95% mid LCx lesions by cath on 10-11-2011). Repeat nuclear myocardial perfusion study stress was normal in November of 2014. Continue ASA, Ranexa, and atorvastatin. No longer on beta blocker due to bradycardia.  4. OSA: She has been compliant with CPAP.   5. PVD: Severe right renal artery stenosis and left SFA stenosis by PV angio 09/2011.   6. Hyperlipidemia: On Lipitor 20 mg daily.   7. Pulmonary: Due to have PFT's to investigate for methotrexate-induced lung injury. Follow up with pulmonary. Echo in 11/15 showed normal RV size and function with mild pulmonary hypertension.  Dispo: f/u 1 month.  Time spent: 40 minutes, of which >50% reviewing hospital records with patient and family and discussing further management.  Kate Sable, M.D., F.A.C.C.

## 2014-09-09 ENCOUNTER — Telehealth: Payer: Self-pay | Admitting: *Deleted

## 2014-09-09 LAB — BASIC METABOLIC PANEL
BUN: 34 mg/dL — ABNORMAL HIGH (ref 6–23)
CALCIUM: 10.3 mg/dL (ref 8.4–10.5)
CO2: 26 mEq/L (ref 19–32)
Chloride: 103 mEq/L (ref 96–112)
Creat: 1.18 mg/dL — ABNORMAL HIGH (ref 0.50–1.10)
GLUCOSE: 136 mg/dL — AB (ref 70–99)
Potassium: 4.6 mEq/L (ref 3.5–5.3)
SODIUM: 137 meq/L (ref 135–145)

## 2014-09-09 NOTE — Telephone Encounter (Signed)
-----   Message from Herminio Commons, MD sent at 09/09/2014 10:20 AM EST ----- Improved. Continue present therapy.

## 2014-09-09 NOTE — Telephone Encounter (Signed)
Patient notified of test results and voiced understanding.  

## 2014-09-17 ENCOUNTER — Encounter: Payer: Self-pay | Admitting: Neurology

## 2014-09-17 ENCOUNTER — Ambulatory Visit (INDEPENDENT_AMBULATORY_CARE_PROVIDER_SITE_OTHER): Payer: Medicare Other | Admitting: Neurology

## 2014-09-17 VITALS — BP 132/80 | HR 76

## 2014-09-17 DIAGNOSIS — F0280 Dementia in other diseases classified elsewhere without behavioral disturbance: Secondary | ICD-10-CM

## 2014-09-17 DIAGNOSIS — G309 Alzheimer's disease, unspecified: Secondary | ICD-10-CM

## 2014-09-17 DIAGNOSIS — G20 Parkinson's disease: Secondary | ICD-10-CM

## 2014-09-17 DIAGNOSIS — G20C Parkinsonism, unspecified: Secondary | ICD-10-CM

## 2014-09-17 DIAGNOSIS — I25119 Atherosclerotic heart disease of native coronary artery with unspecified angina pectoris: Secondary | ICD-10-CM

## 2014-09-17 NOTE — Progress Notes (Signed)
Subjective:   Theresa Mclean was seen in consultation in the movement disorder clinic at the request of [name],[name], MD.  The evaluation is for tremor.  This patient is accompanied in the office by her child (son) who supplements the history.   The patient is a 81 y.o. right handed female with a history of tremor.  Pt states that it started in the last 1-2 months.  It seems to come and goes.  She seems to have it some days and not others.  It is most noticeable when the hands are not in use (at rest).  The L is most obvious and she doesn't notice the right much.  She can try and hold the arm and stop it for a little while. Her hydralazine was increased in November but she isn't sure if that is related.  Tremor: Yes.   Voice: some softer Sleep:   Vivid Dreams:  No.  Acting out dreams:  No. Wet Pillows: No. Postural symptoms:  Yes.    Falls?  No. (states that uses WC because of "bad knees" and doesn't walk now) Bradykinesia symptoms: slow movements, difficulty getting out of a chair and difficulty regaining balance Loss of smell:  Yes.   Loss of taste:  No. Urinary Incontinence:  No. Difficulty Swallowing:  No. (was troublesome but better now) Handwriting, micrographia: had accident in 1991 and hurt hand and hasn't written well since Trouble with ADL's:  Yes.  (needs assist)  Trouble buttoning clothing: Yes.   Depression:  No Memory changes:  Yes.   Hallucinations:  No.  visual distortions: rare N/V:  No. Lightheaded:  Yes.    Syncope: No. Diplopia:  Yes.   (told due to cataract)  09/17/14 update:  The patient is following up sooner than expected, accompanied by her son who supplements the history.  Patient has history of parkinsonian tremor, but no evidence of Parkinson's disease.  Because of horizontal nystagmus, I did order an MRI of the brain.  There was evidence of mild to moderate atrophy and mild to moderate small vessel disease.  I reviewed this with the patient and her  family today.  The patient's son stated that he wanted to talk to me today about memory changes.  The patient admits to memory changes, although she cannot give me specific examples.  The patient's son stated that she forgot her Social Security number on one occasion, which is very unusual.  In addition, she'll sometimes confuse her sons.  Recently, she confused a $1 bill for a $20 bill.    Outside reports reviewed: historical medical records, radiology reports and referral letter/letters.  No Known Allergies  Outpatient Encounter Prescriptions as of 09/17/2014  Medication Sig  . acitretin (SORIATANE) 10 MG capsule Take 10 mg by mouth daily with supper.  Marland Kitchen amLODipine (NORVASC) 10 MG tablet Take 10 mg by mouth daily.  Marland Kitchen aspirin 81 MG tablet Take 81 mg by mouth daily.  Marland Kitchen atorvastatin (LIPITOR) 20 MG tablet Take 20 mg by mouth at bedtime.   . bisacodyl (DULCOLAX) 10 MG suppository Place 1 suppository (10 mg total) rectally 2 (two) times daily.  . chlorthalidone (HYGROTON) 25 MG tablet Take 1 tablet (25 mg total) by mouth daily.  . clobetasol (OLUX) 0.05 % topical foam Apply to scalp daily as needed  . docusate sodium 100 MG CAPS Take 200 mg by mouth 2 (two) times daily.  . ergocalciferol (VITAMIN D2) 50000 UNITS capsule Take 50,000 Units by mouth once a  week.  . feeding supplement, GLUCERNA SHAKE, (GLUCERNA SHAKE) LIQD Take 237 mLs by mouth daily after supper.  . folic acid (FOLVITE) 1 MG tablet Take 1 tablet (1 mg total) by mouth daily.  . furosemide (LASIX) 40 MG tablet TAKE 1 TABLET BY MOUTH DAILY.  Marland Kitchen gabapentin (NEURONTIN) 100 MG capsule Take 2 capsules (200 mg total) by mouth 2 (two) times daily.  . hydrALAZINE (APRESOLINE) 100 MG tablet TAKE 1 TABLET BY MOUTH THREE TIMES DAILY  . HYDROcodone-acetaminophen (NORCO) 7.5-325 MG per tablet Take 1 tablet by mouth every 6 (six) hours as needed for moderate pain.  Marland Kitchen insulin aspart (NOVOLOG) 100 UNIT/ML injection SLIDING SCALE 0-9 Units, Injected  subcutaneous, 3 times daily with meals. CBG < 70: implement hypoglycemia protocol CBG 70 - 120: 0 units CBG 121 - 150: 1 unit CBG 151 - 200: 2 units CBG 201 - 250: 3 units CBG 251 - 300: 5 units CBG 301 - 350: 7 units CBG 351 - 400: 9 units CBG > 400: call MD (Patient taking differently: Inject 0-9 Units into the skin 3 (three) times daily with meals. SLIDING SCALE 0-9 Units, Injected subcutaneous, 3 times daily with meals. CBG < 70: implement hypoglycemia protocol CBG 70 - 120: 0 units CBG 121 - 150: 1 unit CBG 151 - 200: 2 units CBG 201 - 250: 3 units CBG 251 - 300: 5 units CBG 301 - 350: 7 units CBG 351 - 400: 9 units CBG > 400: call MD)  . insulin aspart (NOVOLOG) 100 UNIT/ML injection Inject 0-15 Units into the skin 3 (three) times daily with meals.  . insulin aspart (NOVOLOG) 100 UNIT/ML injection Inject 0-5 Units into the skin at bedtime.  . insulin glargine (LANTUS) 100 UNIT/ML injection Inject 60 Units into the skin 2 (two) times daily.   . Linaclotide (LINZESS) 145 MCG CAPS capsule Take 145 mcg by mouth daily as needed.  . methotrexate (RHEUMATREX) 2.5 MG tablet Take 7.5 mg by mouth every Wednesday. Caution:Chemotherapy. Protect from light. Takes on Wednesday.  . montelukast (SINGULAIR) 10 MG tablet Take 10 mg by mouth daily.  . pantoprazole (PROTONIX) 40 MG tablet Take 40 mg by mouth daily.   . polyethylene glycol (MIRALAX / GLYCOLAX) packet Take 17 g by mouth daily as needed for moderate constipation.  . ranolazine (RANEXA) 500 MG 12 hr tablet Take 1 tablet (500 mg total) by mouth 2 (two) times daily.  Marland Kitchen senna (SENOKOT) 8.6 MG TABS tablet Take 1 tablet (8.6 mg total) by mouth 2 (two) times daily.  Marland Kitchen spironolactone (ALDACTONE) 25 MG tablet Take 1 tablet (25 mg total) by mouth daily.  . [DISCONTINUED] cephALEXin (KEFLEX) 500 MG capsule Take 500 mg by mouth 3 (three) times daily.  . [DISCONTINUED] methocarbamol (ROBAXIN) 750 MG tablet Take 1 po QID for muscle pain    Past  Medical History  Diagnosis Date  . Hypertension   . Nephrolithiasis   . GERD (gastroesophageal reflux disease)   . Arthritis   . Psoriasis   . Morbid obesity   . Mitral regurgitation   . Hyperlipidemia   . Sleep apnea     uses cpap  . CHF (congestive heart failure)     Diastolic. EF 55-65% by cath 10/11/11  . Diabetes mellitus     insulin dependent  . Acute renal insufficiency     09/2011. not on ACEI secondary to this (also has renal artery stenosis)  . Peripheral vascular disease     severe right renal artery  stenosis and left SFA stenosis by PV angio 09/2011, treated medically  . Cellulitis   . CAD (coronary artery disease)     NSTEMI 09/2011 felt poor candidate for CABG, instead s/p PTCA/DES to mid RCA 10/17/11  . Chronic respiratory failure   . Pulmonary hypertension   . Obstructive sleep apnea   . CKD (chronic kidney disease) stage 2, GFR 60-89 ml/min 07/11/2013  . Vitamin D deficiency     Past Surgical History  Procedure Laterality Date  . Acromio-clavicular joint repair  2011  . Abdominal hysterectomy    . Eye surgery      cataract  OD  . Lower extremity angiogram N/A 10/11/2011    Procedure: LOWER EXTREMITY ANGIOGRAM;  Surgeon: Sherren Mocha, MD;  Location: [facility] CATH LAB;  Service: Cardiovascular;  Laterality: N/A;  . Left and right heart catheterization with coronary angiogram N/A 10/11/2011    Procedure: LEFT AND RIGHT HEART CATHETERIZATION WITH CORONARY ANGIOGRAM;  Surgeon: Sherren Mocha, MD;  Location: [facility] CATH LAB;  Service: Cardiovascular;  Laterality: N/A;  . Percutaneous coronary stent intervention (pci-s) N/A 10/17/2011    Procedure: PERCUTANEOUS CORONARY STENT INTERVENTION (PCI-S);  Surgeon: Burnell Blanks, MD;  Location: [facility] CATH LAB;  Service: Cardiovascular;  Laterality: N/A;    History   Social History  . Marital Status: Divorced    Spouse Name: N/A    Number of Children: N/A  . Years of Education: N/A   Occupational History  . Not on file.    Social History Main Topics  . Smoking status: Never Smoker   . Smokeless tobacco: Never Used  . Alcohol Use: No  . Drug Use: No  . Sexual Activity: No   Other Topics Concern  . Not on file   Social History Narrative    Family Status  Relation Status Death Age  . Mother Deceased     Cancer  . Father Deceased     Cancer  . Brother Alive   . Brother Alive   . Brother Alive   . Brother Deceased     accident (horse)  . Sister Alive   . Sister Alive   . Sister Alive   . Sister Alive   . Sister Alive   . Sister Alive   . Sister Alive   . Sister Alive   . Sister Alive   . Sister Deceased     unknown  . Son Alive   . Son Alive   . Son Alive   . Daughter Alive     Review of Systems A complete 10 system ROS was obtained and was negative apart from what is mentioned.   Objective:   VITALS:   Filed Vitals:   09/17/14 1030  BP: 132/80  Pulse: 76   Gen:  Appears stated age and in NAD.   NEUROLOGICAL:  Orientation:   Montreal Cognitive Assessment  09/17/2014  Visuospatial/ Executive (0/5) 1  Naming (0/3) 1  Attention: Read list of digits (0/2) 0  Attention: Read list of letters (0/1) 0  Attention: Serial 7 subtraction starting at 100 (0/3) 0  Language: Repeat phrase (0/2) 2  Language : Fluency (0/1) 0  Abstraction (0/2) 0  Delayed Recall (0/5) 0  Orientation (0/6) 5  Total 9  Adjusted Score (based on education) 10   MOVEMENT EXAM: Tremor:  There is a tremor, L greater than R, with distraction procedures.  It is a rest tremor.  Labs:  Lab Results  Component Value Date   TSH  3.435 08/28/2014     Chemistry      Component Value Date/Time   NA 137 09/08/2014 1158   K 4.6 09/08/2014 1158   CL 103 09/08/2014 1158   CO2 26 09/08/2014 1158   BUN 34* 09/08/2014 1158   CREATININE 1.18* 09/08/2014 1158   CREATININE 1.27* 08/30/2014 1536      Component Value Date/Time   CALCIUM 10.3 09/08/2014 1158   ALKPHOS 62 08/29/2014 0407   AST 15 08/29/2014  0407   ALT 24 08/29/2014 0407   BILITOT 1.2 08/29/2014 0407     Lab Results  Component Value Date   WBC 7.0 08/30/2014   HGB 9.9* 08/30/2014   HCT 31.2* 08/30/2014   MCV 83.2 08/30/2014   PLT 232 08/30/2014        Assessment/Plan:   1.  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  She doesn't meet criteria for PD.  She does have a rest tremor when distracted.  i would not treat this with medication and pt completely agrees.  I would, however, like to watch her periodically to make sure that she doesn't develop PD and will schedule a f/u in 5 months but told her/son to call me if something new develops or worsens. 2.  MRI with cerebral small vessel disease and atrophy  -Multiple risk factors for this including hypertension, hyperlipidemia, diabetes mellitus and peripheral vascular disease. 3.  Memory changes, likely Alzheimer's dementia.  -Long discussion with patient and her family.  Virtually 100% of this visit was spent in counseling.  Discussed safety and medication options.  Discussed what to expect now as well as in the future.  The patient was adamant that she did not want to start medication, although her son very much did.  Her son ultimately stated that he would talk it over with his brother.  They will let me know if they end up changing their mind. 4.  Plan to see her back at her previously scheduled appt in 5 months

## 2014-10-20 ENCOUNTER — Ambulatory Visit (INDEPENDENT_AMBULATORY_CARE_PROVIDER_SITE_OTHER): Payer: Medicare Other | Admitting: Cardiovascular Disease

## 2014-10-20 ENCOUNTER — Encounter: Payer: Self-pay | Admitting: Cardiovascular Disease

## 2014-10-20 VITALS — BP 128/68 | HR 61 | Ht 64.0 in

## 2014-10-20 DIAGNOSIS — N189 Chronic kidney disease, unspecified: Secondary | ICD-10-CM

## 2014-10-20 DIAGNOSIS — I25119 Atherosclerotic heart disease of native coronary artery with unspecified angina pectoris: Secondary | ICD-10-CM

## 2014-10-20 DIAGNOSIS — I10 Essential (primary) hypertension: Secondary | ICD-10-CM

## 2014-10-20 DIAGNOSIS — G4733 Obstructive sleep apnea (adult) (pediatric): Secondary | ICD-10-CM

## 2014-10-20 DIAGNOSIS — E785 Hyperlipidemia, unspecified: Secondary | ICD-10-CM

## 2014-10-20 DIAGNOSIS — I5032 Chronic diastolic (congestive) heart failure: Secondary | ICD-10-CM

## 2014-10-20 DIAGNOSIS — I739 Peripheral vascular disease, unspecified: Secondary | ICD-10-CM

## 2014-10-20 NOTE — Patient Instructions (Signed)
Your physician recommends that you schedule a follow-up appointment in: 4 months  Your physician recommends that you continue on your current medications as directed. Please refer to the Current Medication list given to you today.  Thank you for choosing [facility]!

## 2014-10-20 NOTE — Progress Notes (Signed)
Patient ID: Theresa Mclean, female   DOB: [birth year], 81 y.o.   MRN: [id number]      SUBJECTIVE: The patient presents for routine follow-up for chronic diastolic heart failure, malignant hypertension, and coronary artery disease. Her blood pressure is well controlled. She denies chest pain and shortness of breath. Her son [name] has to encourage her to use CPAP daily. She takes Lasix 80 mg every morning which has helped to attenuate her leg swelling. She is no longer on methotrexate. She also follows with a pulmonologist and nephrologist.   Review of Systems: As per "subjective", otherwise negative.  No Known Allergies  Current Outpatient Prescriptions  Medication Sig Dispense Refill  . acitretin (SORIATANE) 10 MG capsule Take 10 mg by mouth daily with supper.    Marland Kitchen amLODipine (NORVASC) 10 MG tablet Take 10 mg by mouth daily.    Marland Kitchen aspirin 81 MG tablet Take 81 mg by mouth daily.    Marland Kitchen atorvastatin (LIPITOR) 20 MG tablet Take 20 mg by mouth at bedtime.     . bisacodyl (DULCOLAX) 10 MG suppository Place 1 suppository (10 mg total) rectally 2 (two) times daily. 12 suppository 0  . chlorthalidone (HYGROTON) 25 MG tablet Take 1 tablet (25 mg total) by mouth daily. 90 tablet 3  . clobetasol (OLUX) 0.05 % topical foam Apply to scalp daily as needed    . docusate sodium 100 MG CAPS Take 200 mg by mouth 2 (two) times daily. 10 capsule 0  . ergocalciferol (VITAMIN D2) 50000 UNITS capsule Take 50,000 Units by mouth once a week.    . feeding supplement, GLUCERNA SHAKE, (GLUCERNA SHAKE) LIQD Take 237 mLs by mouth daily after supper. 12 Can 2  . folic acid (FOLVITE) 1 MG tablet Take 1 tablet (1 mg total) by mouth daily. 30 tablet 0  . furosemide (LASIX) 40 MG tablet TAKE 1 TABLET BY MOUTH DAILY. 30 tablet 6  . gabapentin (NEURONTIN) 100 MG capsule Take 2 capsules (200 mg total) by mouth 2 (two) times daily. 120 capsule 0  . hydrALAZINE (APRESOLINE) 100 MG tablet TAKE 1 TABLET BY MOUTH THREE TIMES DAILY 90  tablet 3  . HYDROcodone-acetaminophen (NORCO) 7.5-325 MG per tablet Take 1 tablet by mouth every 6 (six) hours as needed for moderate pain.    Marland Kitchen insulin aspart (NOVOLOG) 100 UNIT/ML injection SLIDING SCALE 0-9 Units, Injected subcutaneous, 3 times daily with meals. CBG < 70: implement hypoglycemia protocol CBG 70 - 120: 0 units CBG 121 - 150: 1 unit CBG 151 - 200: 2 units CBG 201 - 250: 3 units CBG 251 - 300: 5 units CBG 301 - 350: 7 units CBG 351 - 400: 9 units CBG > 400: call MD (Patient taking differently: Inject 0-9 Units into the skin 3 (three) times daily with meals. SLIDING SCALE 0-9 Units, Injected subcutaneous, 3 times daily with meals. CBG < 70: implement hypoglycemia protocol CBG 70 - 120: 0 units CBG 121 - 150: 1 unit CBG 151 - 200: 2 units CBG 201 - 250: 3 units CBG 251 - 300: 5 units CBG 301 - 350: 7 units CBG 351 - 400: 9 units CBG > 400: call MD)    . insulin aspart (NOVOLOG) 100 UNIT/ML injection Inject 0-15 Units into the skin 3 (three) times daily with meals. 10 mL 11  . insulin aspart (NOVOLOG) 100 UNIT/ML injection Inject 0-5 Units into the skin at bedtime. 10 mL 11  . insulin glargine (LANTUS) 100 UNIT/ML injection Inject 60 Units into  the skin 2 (two) times daily.     . Linaclotide (LINZESS) 145 MCG CAPS capsule Take 145 mcg by mouth daily as needed.    . methotrexate (RHEUMATREX) 2.5 MG tablet Take 7.5 mg by mouth every Wednesday. Caution:Chemotherapy. Protect from light. Takes on Wednesday.    . montelukast (SINGULAIR) 10 MG tablet Take 10 mg by mouth daily.    . pantoprazole (PROTONIX) 40 MG tablet Take 40 mg by mouth daily.     . polyethylene glycol (MIRALAX / GLYCOLAX) packet Take 17 g by mouth daily as needed for moderate constipation.    . ranolazine (RANEXA) 500 MG 12 hr tablet Take 1 tablet (500 mg total) by mouth 2 (two) times daily. 28 tablet 0  . senna (SENOKOT) 8.6 MG TABS tablet Take 1 tablet (8.6 mg total) by mouth 2 (two) times daily. 120 each 0    . spironolactone (ALDACTONE) 25 MG tablet Take 1 tablet (25 mg total) by mouth daily. 30 tablet 1   No current facility-administered medications for this visit.    Past Medical History  Diagnosis Date  . Hypertension   . Nephrolithiasis   . GERD (gastroesophageal reflux disease)   . Arthritis   . Psoriasis   . Morbid obesity   . Mitral regurgitation   . Hyperlipidemia   . Sleep apnea     uses cpap  . CHF (congestive heart failure)     Diastolic. EF 55-65% by cath 10/11/11  . Diabetes mellitus     insulin dependent  . Acute renal insufficiency     09/2011. not on ACEI secondary to this (also has renal artery stenosis)  . Peripheral vascular disease     severe right renal artery stenosis and left SFA stenosis by PV angio 09/2011, treated medically  . Cellulitis   . CAD (coronary artery disease)     NSTEMI 09/2011 felt poor candidate for CABG, instead s/p PTCA/DES to mid RCA 10/17/11  . Chronic respiratory failure   . Pulmonary hypertension   . Obstructive sleep apnea   . CKD (chronic kidney disease) stage 2, GFR 60-89 ml/min 07/11/2013  . Vitamin D deficiency     Past Surgical History  Procedure Laterality Date  . Acromio-clavicular joint repair  2011  . Abdominal hysterectomy    . Eye surgery      cataract  OD  . Lower extremity angiogram N/A 10/11/2011    Procedure: LOWER EXTREMITY ANGIOGRAM;  Surgeon: Sherren Mocha, MD;  Location: [facility] CATH LAB;  Service: Cardiovascular;  Laterality: N/A;  . Left and right heart catheterization with coronary angiogram N/A 10/11/2011    Procedure: LEFT AND RIGHT HEART CATHETERIZATION WITH CORONARY ANGIOGRAM;  Surgeon: Sherren Mocha, MD;  Location: [facility] CATH LAB;  Service: Cardiovascular;  Laterality: N/A;  . Percutaneous coronary stent intervention (pci-s) N/A 10/17/2011    Procedure: PERCUTANEOUS CORONARY STENT INTERVENTION (PCI-S);  Surgeon: Burnell Blanks, MD;  Location: [facility] CATH LAB;  Service: Cardiovascular;  Laterality: N/A;     History   Social History  . Marital Status: Divorced    Spouse Name: N/A  . Number of Children: N/A  . Years of Education: N/A   Occupational History  . Not on file.   Social History Main Topics  . Smoking status: Never Smoker   . Smokeless tobacco: Never Used  . Alcohol Use: No  . Drug Use: No  . Sexual Activity: No   Other Topics Concern  . Not on file   Social History Narrative  BP 128/68  Pulse 61  SpO2 96%    PHYSICAL EXAM General: NAD Neck: No JVD, no thyromegaly.  Lungs: Clear to auscultation bilaterally, no wheezes or rales. CV: Nondisplaced PMI. Regular rate and rhythm, normal S1/S2, no S3/S4, soft I/VI systolic murmur along left sternal border. No pretibial or periankle edema. No carotid bruit.  Abdomen: Soft, obese, nontender,no distention.  Neurologic: Alert and oriented. Psych: Normal affect.  Skin: Normal. Musculoskeletal: No gross deformities.  ECG: Most recent ECG reviewed.     ASSESSMENT AND PLAN: 1. Chronic diastolic heart failure: Compensated and euvolemic. Continue Lasix 80 mg daily, along with hydralazine and spironolactone. Echo in 11/15 showed normal RV size and function with mild pulmonary hypertension.  2. Malignant hypertension: Well controlled today. She is not on an ACEI reportedly because of severe right renal artery stenosis. Continue amlodipine 10 mg , hydralazine 100 mg 3 times a day, chlorthalidone 25 mg, and spironolactone 25 mg.  3. CAD: Resting nuclear images in 06/2014 did not show ischemia, and showed thinning in the basal anterolateral wall and inferolateral wall.  Has h/o PCI to RCA in 2013, with cath demonstrating 2-vessel disease in the RCA and with severe left circumflex disease (serial 90-95% mid LCx lesions by cath on 10-11-2011). Repeat nuclear myocardial perfusion study stress was normal in November of 2014. Continue ASA, Ranexa, and atorvastatin. No longer on beta blocker due to bradycardia.  4. OSA: She  has been compliant with CPAP.   5. PVD: Severe right renal artery stenosis and left SFA stenosis by PV angio 09/2011.   6. Hyperlipidemia: On Lipitor 20 mg daily.   7. Pulmonary: Follows up with pulmonary. Echo in 11/15 showed normal RV size and function with mild pulmonary hypertension.  Dispo: f/u 4 months.   Kate Sable, M.D., F.A.C.C.

## 2014-11-04 ENCOUNTER — Inpatient Hospital Stay (HOSPITAL_COMMUNITY)
Admission: EM | Admit: 2014-11-04 | Discharge: 2014-11-05 | DRG: 392 | Disposition: A | Discharge status: Home Health | Payer: Medicare Other | Attending: Internal Medicine | Admitting: Internal Medicine

## 2014-11-04 ENCOUNTER — Emergency Department (HOSPITAL_COMMUNITY): Payer: Medicare Other

## 2014-11-04 ENCOUNTER — Encounter (HOSPITAL_COMMUNITY): Payer: Self-pay | Admitting: Emergency Medicine

## 2014-11-04 ENCOUNTER — Ambulatory Visit: Payer: Medicare Other | Admitting: Cardiovascular Disease

## 2014-11-04 DIAGNOSIS — R05 Cough: Secondary | ICD-10-CM

## 2014-11-04 DIAGNOSIS — R1084 Generalized abdominal pain: Secondary | ICD-10-CM | POA: Diagnosis not present

## 2014-11-04 DIAGNOSIS — K219 Gastro-esophageal reflux disease without esophagitis: Secondary | ICD-10-CM | POA: Diagnosis present

## 2014-11-04 DIAGNOSIS — N182 Chronic kidney disease, stage 2 (mild): Secondary | ICD-10-CM | POA: Diagnosis present

## 2014-11-04 DIAGNOSIS — Z79899 Other long term (current) drug therapy: Secondary | ICD-10-CM | POA: Diagnosis not present

## 2014-11-04 DIAGNOSIS — Z7982 Long term (current) use of aspirin: Secondary | ICD-10-CM | POA: Diagnosis not present

## 2014-11-04 DIAGNOSIS — Z955 Presence of coronary angioplasty implant and graft: Secondary | ICD-10-CM | POA: Diagnosis not present

## 2014-11-04 DIAGNOSIS — Z6827 Body mass index (BMI) 27.0-27.9, adult: Secondary | ICD-10-CM | POA: Diagnosis not present

## 2014-11-04 DIAGNOSIS — E785 Hyperlipidemia, unspecified: Secondary | ICD-10-CM | POA: Diagnosis present

## 2014-11-04 DIAGNOSIS — I5032 Chronic diastolic (congestive) heart failure: Secondary | ICD-10-CM | POA: Diagnosis present

## 2014-11-04 DIAGNOSIS — I272 Other secondary pulmonary hypertension: Secondary | ICD-10-CM | POA: Diagnosis present

## 2014-11-04 DIAGNOSIS — E669 Obesity, unspecified: Secondary | ICD-10-CM | POA: Diagnosis present

## 2014-11-04 DIAGNOSIS — Z794 Long term (current) use of insulin: Secondary | ICD-10-CM

## 2014-11-04 DIAGNOSIS — I2510 Atherosclerotic heart disease of native coronary artery without angina pectoris: Secondary | ICD-10-CM | POA: Diagnosis present

## 2014-11-04 DIAGNOSIS — E860 Dehydration: Secondary | ICD-10-CM | POA: Diagnosis present

## 2014-11-04 DIAGNOSIS — R109 Unspecified abdominal pain: Secondary | ICD-10-CM | POA: Diagnosis present

## 2014-11-04 DIAGNOSIS — J9610 Chronic respiratory failure, unspecified whether with hypoxia or hypercapnia: Secondary | ICD-10-CM | POA: Diagnosis present

## 2014-11-04 DIAGNOSIS — G4733 Obstructive sleep apnea (adult) (pediatric): Secondary | ICD-10-CM | POA: Diagnosis present

## 2014-11-04 DIAGNOSIS — R1010 Upper abdominal pain, unspecified: Secondary | ICD-10-CM

## 2014-11-04 DIAGNOSIS — I739 Peripheral vascular disease, unspecified: Secondary | ICD-10-CM | POA: Diagnosis present

## 2014-11-04 DIAGNOSIS — I129 Hypertensive chronic kidney disease with stage 1 through stage 4 chronic kidney disease, or unspecified chronic kidney disease: Secondary | ICD-10-CM | POA: Diagnosis present

## 2014-11-04 DIAGNOSIS — R059 Cough, unspecified: Secondary | ICD-10-CM

## 2014-11-04 DIAGNOSIS — E119 Type 2 diabetes mellitus without complications: Secondary | ICD-10-CM | POA: Diagnosis present

## 2014-11-04 DIAGNOSIS — M1990 Unspecified osteoarthritis, unspecified site: Secondary | ICD-10-CM | POA: Diagnosis present

## 2014-11-04 DIAGNOSIS — K5900 Constipation, unspecified: Secondary | ICD-10-CM | POA: Diagnosis present

## 2014-11-04 DIAGNOSIS — N189 Chronic kidney disease, unspecified: Secondary | ICD-10-CM | POA: Diagnosis present

## 2014-11-04 DIAGNOSIS — I10 Essential (primary) hypertension: Secondary | ICD-10-CM | POA: Diagnosis present

## 2014-11-04 DIAGNOSIS — K5909 Other constipation: Secondary | ICD-10-CM | POA: Diagnosis not present

## 2014-11-04 LAB — COMPREHENSIVE METABOLIC PANEL
ALT: 38 U/L — ABNORMAL HIGH (ref 0–35)
AST: 31 U/L (ref 0–37)
Albumin: 4.6 g/dL (ref 3.5–5.2)
Alkaline Phosphatase: 67 U/L (ref 39–117)
Anion gap: 13 (ref 5–15)
BUN: 64 mg/dL — ABNORMAL HIGH (ref 6–23)
CO2: 30 mmol/L (ref 19–32)
Calcium: 11.3 mg/dL — ABNORMAL HIGH (ref 8.4–10.5)
Chloride: 87 mmol/L — ABNORMAL LOW (ref 96–112)
Creatinine, Ser: 1.26 mg/dL — ABNORMAL HIGH (ref 0.50–1.10)
GFR calc Af Amer: 44 mL/min — ABNORMAL LOW (ref >90)
GFR calc non Af Amer: 38 mL/min — ABNORMAL LOW (ref >90)
Glucose, Bld: 229 mg/dL — ABNORMAL HIGH (ref 70–99)
Potassium: 4 mmol/L (ref 3.5–5.1)
Sodium: 130 mmol/L — ABNORMAL LOW (ref 135–145)
Total Bilirubin: 0.6 mg/dL (ref 0.3–1.2)
Total Protein: 9.1 g/dL — ABNORMAL HIGH (ref 6.0–8.3)

## 2014-11-04 LAB — TROPONIN I: Troponin I: 0.05 ng/mL — ABNORMAL HIGH (ref <0.031)

## 2014-11-04 LAB — CBC WITH DIFFERENTIAL/PLATELET
Basophils Absolute: 0 10*3/uL (ref 0.0–0.1)
Basophils Relative: 0 % (ref 0–1)
Eosinophils Absolute: 0 10*3/uL (ref 0.0–0.7)
Eosinophils Relative: 0 % (ref 0–5)
HCT: 41.7 % (ref 36.0–46.0)
Hemoglobin: 13.4 g/dL (ref 12.0–15.0)
Lymphocytes Relative: 8 % — ABNORMAL LOW (ref 12–46)
Lymphs Abs: 1 10*3/uL (ref 0.7–4.0)
MCH: 25.9 pg — ABNORMAL LOW (ref 26.0–34.0)
MCHC: 32.1 g/dL (ref 30.0–36.0)
MCV: 80.5 fL (ref 78.0–100.0)
Monocytes Absolute: 1 10*3/uL (ref 0.1–1.0)
Monocytes Relative: 8 % (ref 3–12)
Neutro Abs: 11.1 10*3/uL — ABNORMAL HIGH (ref 1.7–7.7)
Neutrophils Relative %: 84 % — ABNORMAL HIGH (ref 43–77)
Platelets: 329 10*3/uL (ref 150–400)
RBC: 5.18 MIL/uL — ABNORMAL HIGH (ref 3.87–5.11)
RDW: 17.5 % — ABNORMAL HIGH (ref 11.5–15.5)
WBC: 13.2 10*3/uL — ABNORMAL HIGH (ref 4.0–10.5)

## 2014-11-04 LAB — CBG MONITORING, ED: Glucose-Capillary: 214 mg/dL — ABNORMAL HIGH (ref 70–99)

## 2014-11-04 LAB — LIPASE, BLOOD: Lipase: 30 U/L (ref 11–59)

## 2014-11-04 MED ORDER — ASPIRIN 81 MG PO CHEW
324.0000 mg | CHEWABLE_TABLET | Freq: Once | ORAL | Status: AC
  Administered 2014-11-04: 324 mg via ORAL
  Filled 2014-11-04: qty 4

## 2014-11-04 MED ORDER — DOCUSATE SODIUM 100 MG PO CAPS: 100.0000 mg | ORAL_CAPSULE | Freq: Two times a day (BID) | ORAL | Status: DC | PRN

## 2014-11-04 MED ORDER — HYDROMORPHONE HCL 1 MG/ML IJ SOLN
1.0000 mg | Freq: Once | INTRAMUSCULAR | Status: AC
  Administered 2014-11-04: 1 mg via INTRAMUSCULAR

## 2014-11-04 MED ORDER — HYDROMORPHONE HCL 1 MG/ML IJ SOLN
0.5000 mg | Freq: Once | INTRAMUSCULAR | Status: DC
  Filled 2014-11-04: qty 1

## 2014-11-04 MED ORDER — HYDROMORPHONE HCL 1 MG/ML IJ SOLN: 0.5000 mg | Freq: Once | INTRAMUSCULAR | Status: DC

## 2014-11-04 NOTE — ED Notes (Signed)
This RN attempted x1, Ellison Carwin, RN attempted x2 for IV access without success. Gary Fleet, RN at bedside attempting access.

## 2014-11-04 NOTE — ED Notes (Signed)
Pt reports she has been unable to move her bowels in approx 5 days. Pt c/o dry hacking cough for approx 1 week. Pt states she coughs so much she gets dizzy.

## 2014-11-04 NOTE — Discharge Instructions (Signed)
Abdominal Pain °Many things can cause abdominal pain. Usually, abdominal pain is not caused by a disease and will improve without treatment. It can often be observed and treated at home. Your health care provider will do a physical exam and possibly order blood tests and X-rays to help determine the seriousness of your pain. However, in many cases, more time must pass before a clear cause of the pain can be found. Before that point, your health care provider may not know if you need more testing or further treatment. °HOME CARE INSTRUCTIONS  °Monitor your abdominal pain for any changes. The following actions may help to alleviate any discomfort you are experiencing: °· Only take over-the-counter or prescription medicines as directed by your health care provider. °· Do not take laxatives unless directed to do so by your health care provider. °· Try a clear liquid diet (broth, tea, or water) as directed by your health care provider. Slowly move to a bland diet as tolerated. °SEEK MEDICAL CARE IF: °· You have unexplained abdominal pain. °· You have abdominal pain associated with nausea or diarrhea. °· You have pain when you urinate or have a bowel movement. °· You experience abdominal pain that wakes you in the night. °· You have abdominal pain that is worsened or improved by eating food. °· You have abdominal pain that is worsened with eating fatty foods. °· You have a fever. °SEEK IMMEDIATE MEDICAL CARE IF:  °· Your pain does not go away within 2 hours. °· You keep throwing up (vomiting). °· Your pain is felt only in portions of the abdomen, such as the right side or the left lower portion of the abdomen. °· You pass bloody or black tarry stools. °MAKE SURE YOU: °· Understand these instructions.   °· Will watch your condition.   °· Will get help right away if you are not doing well or get worse.   °Document Released: 05/25/2005 Document Revised: 08/20/2013 Document Reviewed: 04/24/2013 °Exit[employer]® Patient Information  ©2015 [employer]. This information is not intended to replace advice given to you by your health care provider. Make sure you discuss any questions you have with your health care provider. ° °Constipation °Constipation is when a person has fewer than three bowel movements a week, has difficulty having a bowel movement, or has stools that are dry, hard, or larger than normal. As people grow older, constipation is more common. If you try to fix constipation with medicines that make you have a bowel movement (laxatives), the problem may get worse. Long-term laxative use may cause the muscles of the colon to become weak. A low-fiber diet, not taking in enough fluids, and taking certain medicines may make constipation worse.  °CAUSES  °· Certain medicines, such as antidepressants, pain medicine, iron supplements, antacids, and water pills.   °· Certain diseases, such as diabetes, irritable bowel syndrome (IBS), thyroid disease, or depression.   °· Not drinking enough water.   °· Not eating enough fiber-rich foods.   °· Stress or travel.   °· Lack of physical activity or exercise.   °· Ignoring the urge to have a bowel movement.   °· Using laxatives too much.   °SIGNS AND SYMPTOMS  °· Having fewer than three bowel movements a week.   °· Straining to have a bowel movement.   °· Having stools that are hard, dry, or larger than normal.   °· Feeling full or bloated.   °· Pain in the lower abdomen.   °· Not feeling relief after having a bowel movement.   °DIAGNOSIS  °Your health care provider will take   a medical history and perform a physical exam. Further testing may be done for severe constipation. Some tests may include: °· A barium enema X-ray to examine your rectum, colon, and, sometimes, your small intestine.   °· A sigmoidoscopy to examine your lower colon.   °· A colonoscopy to examine your entire colon. °TREATMENT  °Treatment will depend on the severity of your constipation and what is causing it. Some dietary  treatments include drinking more fluids and eating more fiber-rich foods. Lifestyle treatments may include regular exercise. If these diet and lifestyle recommendations do not help, your health care provider may recommend taking over-the-counter laxative medicines to help you have bowel movements. Prescription medicines may be prescribed if over-the-counter medicines do not work.  °HOME CARE INSTRUCTIONS  °· Eat foods that have a lot of fiber, such as fruits, vegetables, whole grains, and beans. °· Limit foods high in fat and processed sugars, such as french fries, hamburgers, cookies, candies, and soda.   °· A fiber supplement may be added to your diet if you cannot get enough fiber from foods.   °· Drink enough fluids to keep your urine clear or pale yellow.   °· Exercise regularly or as directed by your health care provider.   °· Go to the restroom when you have the urge to go. Do not hold it.   °· Only take over-the-counter or prescription medicines as directed by your health care provider. Do not take other medicines for constipation without talking to your health care provider first.   °SEEK IMMEDIATE MEDICAL CARE IF:  °· You have bright red blood in your stool.   °· Your constipation lasts for more than 4 days or gets worse.   °· You have abdominal or rectal pain.   °· You have thin, pencil-like stools.   °· You have unexplained weight loss. °MAKE SURE YOU:  °· Understand these instructions. °· Will watch your condition. °· Will get help right away if you are not doing well or get worse. °Document Released: 05/13/2004 Document Revised: 08/20/2013 Document Reviewed: 05/27/2013 °ExitCare® Patient Information ©2015 [employer]. This information is not intended to replace advice given to you by your health care provider. Make sure you discuss any questions you have with your health care provider. ° °

## 2014-11-04 NOTE — ED Notes (Signed)
EDP at bedside  

## 2014-11-04 NOTE — ED Notes (Signed)
Brought pt. Water. Pt. Refusing to drink anything at this time, stating "I don't feel like it right now".

## 2014-11-04 NOTE — ED Provider Notes (Signed)
CSN: [id number]     Arrival date & time 11/04/14  1406 History   First MD Initiated Contact with Patient 11/04/14 1816     Chief Complaint  Patient presents with  . Constipation  . Cough     (Consider location/radiation/quality/duration/timing/severity/associated sxs/prior Treatment) HPI   85yF with abdominal pain. Onset about 5 days ago. Upper abdomen. Constant. Worse with movement. No appreciable exacerbating or relieving factors otherwise. Hx of recurrent constipation. Last BM over a week ago. Sometimes has similar type pain, but quite to this degree. No n/v. No fever or chills. No urinary complaints. Chronic respiratory failure on home 02, 3L via [place]. Denies acute SOB. Occasional nonproductive cough.   Past Medical History  Diagnosis Date  . Hypertension   . Nephrolithiasis   . GERD (gastroesophageal reflux disease)   . Arthritis   . Psoriasis   . Morbid obesity   . Mitral regurgitation   . Hyperlipidemia   . Sleep apnea     uses cpap  . CHF (congestive heart failure)     Diastolic. EF 55-65% by cath 10/11/11  . Diabetes mellitus     insulin dependent  . Acute renal insufficiency     09/2011. not on ACEI secondary to this (also has renal artery stenosis)  . Peripheral vascular disease     severe right renal artery stenosis and left SFA stenosis by PV angio 09/2011, treated medically  . Cellulitis   . CAD (coronary artery disease)     NSTEMI 09/2011 felt poor candidate for CABG, instead s/p PTCA/DES to mid RCA 10/17/11  . Chronic respiratory failure   . Pulmonary hypertension   . Obstructive sleep apnea   . CKD (chronic kidney disease) stage 2, GFR 60-89 ml/min 07/11/2013  . Vitamin D deficiency    Past Surgical History  Procedure Laterality Date  . Acromio-clavicular joint repair  2011  . Abdominal hysterectomy    . Eye surgery      cataract  OD  . Lower extremity angiogram N/A 10/11/2011    Procedure: LOWER EXTREMITY ANGIOGRAM;  Surgeon: Sherren Mocha, MD;  Location:  [facility] CATH LAB;  Service: Cardiovascular;  Laterality: N/A;  . Left and right heart catheterization with coronary angiogram N/A 10/11/2011    Procedure: LEFT AND RIGHT HEART CATHETERIZATION WITH CORONARY ANGIOGRAM;  Surgeon: Sherren Mocha, MD;  Location: [facility] CATH LAB;  Service: Cardiovascular;  Laterality: N/A;  . Percutaneous coronary stent intervention (pci-s) N/A 10/17/2011    Procedure: PERCUTANEOUS CORONARY STENT INTERVENTION (PCI-S);  Surgeon: Burnell Blanks, MD;  Location: [facility] CATH LAB;  Service: Cardiovascular;  Laterality: N/A;   Family History  Problem Relation Age of Onset  . Psoriasis Brother    History  Substance Use Topics  . Smoking status: Never Smoker   . Smokeless tobacco: Never Used  . Alcohol Use: No   OB History    Gravida Para Term Preterm AB TAB SAB Ectopic Multiple Living   4 4 4             Review of Systems  All systems reviewed and negative, other than as noted in HPI.   Allergies  Ivp dye  Home Medications   Prior to Admission medications   Medication Sig Start Date End Date Taking? Authorizing Provider  acitretin (SORIATANE) 10 MG capsule Take 10 mg by mouth daily with supper. 08/01/13  Yes Historical Provider, MD  amLODipine (NORVASC) 10 MG tablet Take 10 mg by mouth every morning.    Yes Historical Provider, MD  aspirin 81 MG tablet Take 81 mg by mouth daily.   Yes Historical Provider, MD  atorvastatin (LIPITOR) 20 MG tablet Take 20 mg by mouth every evening.    Yes Historical Provider, MD  benzonatate (TESSALON) 100 MG capsule Take 100 mg by mouth 3 (three) times daily as needed for cough.   Yes Historical Provider, MD  chlorthalidone (HYGROTON) 25 MG tablet Take 1 tablet (25 mg total) by mouth daily. Patient taking differently: Take 25 mg by mouth every morning.  09/05/14  Yes Herminio Commons, MD  clobetasol (OLUX) 0.05 % topical foam Apply to scalp daily as needed 04/24/13  Yes Historical Provider, MD  feeding supplement, GLUCERNA SHAKE,  (GLUCERNA SHAKE) LIQD Take 237 mLs by mouth daily after supper. 07/14/14  Yes Geradine Girt, DO  fexofenadine (ALLEGRA) 180 MG tablet Take 180 mg by mouth every evening.   Yes Historical Provider, MD  fluticasone (FLONASE) 50 MCG/ACT nasal spray Place 2 sprays into both nostrils daily as needed for allergies or rhinitis.   Yes Historical Provider, MD  folic acid (FOLVITE) 1 MG tablet Take 1 tablet (1 mg total) by mouth daily. 08/30/14  Yes Nita Sells, MD  furosemide (LASIX) 40 MG tablet Take 80-120 mg by mouth every morning.   Yes Historical Provider, MD  gabapentin (NEURONTIN) 100 MG capsule Take 2 capsules (200 mg total) by mouth 2 (two) times daily. 07/14/14  Yes Geradine Girt, DO  hydrALAZINE (APRESOLINE) 100 MG tablet TAKE 1 TABLET BY MOUTH THREE TIMES DAILY 08/11/14  Yes Herminio Commons, MD  insulin aspart (NOVOLOG) 100 UNIT/ML injection SLIDING SCALE 0-9 Units, Injected subcutaneous, 3 times daily with meals. CBG < 70: implement hypoglycemia protocol CBG 70 - 120: 0 units CBG 121 - 150: 1 unit CBG 151 - 200: 2 units CBG 201 - 250: 3 units CBG 251 - 300: 5 units CBG 301 - 350: 7 units CBG 351 - 400: 9 units CBG > 400: call MD Patient taking differently: Inject 0-9 Units into the skin 3 (three) times daily with meals. SLIDING SCALE 0-9 Units, Injected subcutaneous, 3 times daily with meals. CBG < 70: implement hypoglycemia protocol CBG 70 - 120: 0 units CBG 121 - 150: 1 unit CBG 151 - 200: 2 units CBG 201 - 250: 3 units CBG 251 - 300: 5 units CBG 301 - 350: 7 units CBG 351 - 400: 9 units CBG > 400: call MD 10/20/11  Yes Dayna N Dunn, PA-C  insulin glargine (LANTUS) 100 UNIT/ML injection Inject 60 Units into the skin 2 (two) times daily.    Yes Historical Provider, MD  montelukast (SINGULAIR) 10 MG tablet Take 10 mg by mouth every morning.    Yes Historical Provider, MD  nitroGLYCERIN (NITROSTAT) 0.4 MG SL tablet Place 0.4 mg under the tongue every 5 (five) minutes as  needed for chest pain.   Yes Historical Provider, MD  pantoprazole (PROTONIX) 40 MG tablet Take 40 mg by mouth every morning.    Yes Historical Provider, MD  predniSONE (DELTASONE) 10 MG tablet Take 10 mg by mouth 2 (two) times daily. 7 day course start on 10/31/2014   Yes Historical Provider, MD  ranolazine (RANEXA) 500 MG 12 hr tablet Take 1 tablet (500 mg total) by mouth 2 (two) times daily. 05/26/14  Yes Herminio Commons, MD  spironolactone (ALDACTONE) 25 MG tablet Take 1 tablet (25 mg total) by mouth daily. 08/16/13  Yes Kathie Dike, MD  triamcinolone ointment (KENALOG) 0.1 %  Apply 1 application topically 2 (two) times daily as needed (FOR ITCHING/IRRITATION). APPLY TO AFFECTED AREA TWICE DAILY AS NEEDED 10/30/14  Yes Historical Provider, MD  HYDROcodone-acetaminophen ([place]) 7.5-325 MG per tablet Take 1 tablet by mouth every 6 (six) hours as needed for moderate pain.    Historical Provider, MD   BP 171/48 mmHg  Pulse 71  Temp(Src) 98.3 F (36.8 C) (Oral)  Resp 18  Ht 5\' 4"  (1.626 m)  Wt 224 lb (101.606 kg)  BMI 38.43 kg/m2  SpO2 98%  LMP 08/29/1990 Physical Exam  Constitutional: She appears well-developed and well-nourished. No distress.  Laying in bed. Appears somewhat uncomfortable.   HENT:  Head: Normocephalic and atraumatic.  Eyes: Conjunctivae are normal. Right eye exhibits no discharge. Left eye exhibits no discharge.  Neck: Neck supple.  Cardiovascular: Normal rate, regular rhythm and normal heart sounds.  Exam reveals no gallop and no friction rub.   No murmur heard. Pulmonary/Chest: Effort normal and breath sounds normal. No respiratory distress.  Abdominal: Soft. She exhibits no distension. There is tenderness. There is no rebound and no guarding.  Epigastric tenderness w/o rebound or guarding.   Musculoskeletal: She exhibits no edema or tenderness.  Neurological: She is alert.  drowsy but opens eyes to voice and answers appropriately follows commands. No acute focal  motor deficit.   Skin: Skin is warm and dry.  Psychiatric: She has a normal mood and affect. Her behavior is normal. Thought content normal.  Nursing note and vitals reviewed.   ED Course  Procedures (including critical care time) Labs Review Labs Reviewed  CBC WITH DIFFERENTIAL/PLATELET - Abnormal; Notable for the following:    WBC 13.2 (*)    RBC 5.18 (*)    MCH 25.9 (*)    RDW 17.5 (*)    Neutrophils Relative % 84 (*)    Neutro Abs 11.1 (*)    Lymphocytes Relative 8 (*)    All other components within normal limits  COMPREHENSIVE METABOLIC PANEL - Abnormal; Notable for the following:    Sodium 130 (*)    Chloride 87 (*)    Glucose, Bld 229 (*)    BUN 64 (*)    Creatinine, Ser 1.26 (*)    Calcium 11.3 (*)    Total Protein 9.1 (*)    ALT 38 (*)    GFR calc non Af Amer 38 (*)    GFR calc Af Amer 44 (*)    All other components within normal limits  CBG MONITORING, ED - Abnormal; Notable for the following:    Glucose-Capillary 214 (*)    All other components within normal limits  CBC WITH DIFFERENTIAL/PLATELET  LIPASE, BLOOD  URINALYSIS, ROUTINE W REFLEX MICROSCOPIC    Imaging Review Ct Abdomen Pelvis Wo Contrast  11/04/2014   CLINICAL DATA:  Upper abdominal pain.  History of renal stones.  EXAM: CT ABDOMEN AND PELVIS WITHOUT CONTRAST  TECHNIQUE: Multidetector CT imaging of the abdomen and pelvis was performed following the standard protocol without IV contrast.  COMPARISON:  07/08/2014  FINDINGS: The lung bases are clear. There is coronary artery atherosclerosis involving the left main, LAD and RCA.  No renal, ureteral, or bladder calculi. No obstructive uropathy. No perinephric stranding is seen. The kidneys are symmetric in size without evidence for exophytic mass. The bladder is distended  The liver demonstrates no focal abnormality. The gallbladder is distended. The spleen demonstrates no focal abnormality. The adrenal glands and pancreas are normal.  The stomach, duodenum,  small intestine  and large intestine are unremarkable. There is a moderate amount of stool throughout the colon. There is no pneumoperitoneum, pneumatosis, or portal venous gas. There is no abdominal or pelvic free fluid. There is no lymphadenopathy.  The abdominal aorta is normal in caliber with atherosclerosis.  There is no lytic or sclerotic osseous lesion. There is facet arthropathy diffusely throughout the lumbar spine. There is mild osteoarthritis of bilateral hips.  IMPRESSION: 1. No urolithiasis or obstructive uropathy. 2. Distended gallbladder. 3. Moderate amount of stool throughout the colon. 4. Coronary artery atherosclerosis.   Electronically Signed   By: Kathreen Devoid   On: 11/04/2014 21:12   Dg Chest Port 1 View  11/04/2014   CLINICAL DATA:  Cough.  Cough for 1 week.  EXAM: PORTABLE CHEST - 1 VIEW  COMPARISON:  None.  FINDINGS: Cardiomegaly remains present, similar to prior exam 08/28/2014. There is pulmonary vascular congestion. Diffuse interstitial prominence is a chronic finding in this patient and probably related to repeated episodes of CHF. No focal consolidation. No effusion.  IMPRESSION: Cardiomegaly and chronic pulmonary vascular congestion. No acute abnormality or interval change.   Electronically Signed   By: Dereck Ligas M.D.   On: 11/04/2014 18:38     EKG Interpretation None      MDM   Final diagnoses:  Constipation, unspecified constipation type  Pain of upper abdomen  Dehydration    85yF with upper abdominal pain. Tenderness in epigastrium w/o rebound guarding. CT as above. Hx of recurrent constipation. Not clear if this is etiology of pain pain. Known CAD but symptoms very atypical with constant duration for days and pain is clearly reproducible with palpation. BUN significantly elevated, hyponatremic consistent with dehydration. Multiple family members at bedside. They report that it's a constant battle to get pt to drink anything. On lasix/aldactone. Encouraging pt  to drink in ED with poor effort. Very difficult to obtain IV access. Numerous attempts by nursing and myself with US guidance. Able to obtain blood for testing but not an IV. Appreciate help of other EDP in trying to obtain access as well.     Virgel Manifold, MD 11/10/14 1029

## 2014-11-04 NOTE — ED Notes (Signed)
Dr. Wilson Singer at bedside attempting IV access.

## 2014-11-04 NOTE — ED Provider Notes (Signed)
Angiocath insertion Performed by: Carmin Muskrat  Consent: Verbal consent obtained. Risks and benefits: risks, benefits and alternatives were discussed Time out: Immediately prior to procedure a "time out" was called to verify the correct patient, procedure, equipment, support staff and site/side marked as required.  Preparation: Patient was prepped and draped in the usual sterile fashion.  Vein Location: R deep brachial  Ultrasound Guided  Gauge: 20  Normal blood return and flush without difficulty Patient tolerance: Patient tolerated the procedure well with no immediate complications.     Carmin Muskrat, MD 11/04/14 [phone]

## 2014-11-05 ENCOUNTER — Inpatient Hospital Stay (HOSPITAL_COMMUNITY): Payer: Medicare Other

## 2014-11-05 ENCOUNTER — Encounter (HOSPITAL_COMMUNITY): Payer: Self-pay

## 2014-11-05 DIAGNOSIS — K5909 Other constipation: Secondary | ICD-10-CM

## 2014-11-05 DIAGNOSIS — E860 Dehydration: Secondary | ICD-10-CM

## 2014-11-05 DIAGNOSIS — R1084 Generalized abdominal pain: Secondary | ICD-10-CM

## 2014-11-05 DIAGNOSIS — R109 Unspecified abdominal pain: Secondary | ICD-10-CM | POA: Diagnosis present

## 2014-11-05 DIAGNOSIS — K5900 Constipation, unspecified: Principal | ICD-10-CM

## 2014-11-05 LAB — CBC
HCT: 40.1 % (ref 36.0–46.0)
Hemoglobin: 13 g/dL (ref 12.0–15.0)
MCH: 26.2 pg (ref 26.0–34.0)
MCHC: 32.4 g/dL (ref 30.0–36.0)
MCV: 80.8 fL (ref 78.0–100.0)
Platelets: 318 10*3/uL (ref 150–400)
RBC: 4.96 MIL/uL (ref 3.87–5.11)
RDW: 17.6 % — ABNORMAL HIGH (ref 11.5–15.5)
WBC: 13.8 10*3/uL — ABNORMAL HIGH (ref 4.0–10.5)

## 2014-11-05 LAB — URINALYSIS, ROUTINE W REFLEX MICROSCOPIC
Bilirubin Urine: NEGATIVE
Glucose, UA: NEGATIVE mg/dL
Hgb urine dipstick: NEGATIVE
Ketones, ur: NEGATIVE mg/dL
LEUKOCYTES UA: NEGATIVE
Nitrite: NEGATIVE
PH: 6.5 (ref 5.0–8.0)
PROTEIN: NEGATIVE mg/dL
SPECIFIC GRAVITY, URINE: 1.01 (ref 1.005–1.030)
UROBILINOGEN UA: 0.2 mg/dL (ref 0.0–1.0)

## 2014-11-05 LAB — GLUCOSE, CAPILLARY
GLUCOSE-CAPILLARY: 112 mg/dL — AB (ref 70–99)
Glucose-Capillary: 198 mg/dL — ABNORMAL HIGH (ref 70–99)
Glucose-Capillary: 239 mg/dL — ABNORMAL HIGH (ref 70–99)
Glucose-Capillary: 279 mg/dL — ABNORMAL HIGH (ref 70–99)

## 2014-11-05 LAB — BASIC METABOLIC PANEL
Anion gap: 12 (ref 5–15)
BUN: 64 mg/dL — AB (ref 6–23)
CO2: 32 mmol/L (ref 19–32)
CREATININE: 1.27 mg/dL — AB (ref 0.50–1.10)
Calcium: 11.1 mg/dL — ABNORMAL HIGH (ref 8.4–10.5)
Chloride: 89 mmol/L — ABNORMAL LOW (ref 96–112)
GFR calc non Af Amer: 38 mL/min — ABNORMAL LOW (ref >90)
GFR, EST AFRICAN AMERICAN: 44 mL/min — AB (ref >90)
Glucose, Bld: 144 mg/dL — ABNORMAL HIGH (ref 70–99)
POTASSIUM: 3.4 mmol/L — AB (ref 3.5–5.1)
Sodium: 133 mmol/L — ABNORMAL LOW (ref 135–145)

## 2014-11-05 LAB — TROPONIN I: Troponin I: 0.06 ng/mL — ABNORMAL HIGH (ref <0.031)

## 2014-11-05 LAB — MRSA PCR SCREENING: MRSA by PCR: POSITIVE — AB

## 2014-11-05 MED ORDER — FLEET ENEMA 7-19 GM/118ML RE ENEM
1.0000 | ENEMA | Freq: Once | RECTAL | Status: AC | PRN
  Administered 2014-11-05: 1 via RECTAL

## 2014-11-05 MED ORDER — ENSURE COMPLETE PO LIQD
237.0000 mL | Freq: Two times a day (BID) | ORAL | Status: DC
  Administered 2014-11-05: 237 mL via ORAL

## 2014-11-05 MED ORDER — SODIUM CHLORIDE 0.9 % IV SOLN: INTRAVENOUS | Status: DC

## 2014-11-05 MED ORDER — CHLORHEXIDINE GLUCONATE CLOTH 2 % EX PADS
6.0000 | MEDICATED_PAD | Freq: Every day | CUTANEOUS | Status: DC
  Administered 2014-11-05: 6 via TOPICAL

## 2014-11-05 MED ORDER — POLYETHYLENE GLYCOL 3350 17 G PO PACK: 17.0000 g | PACK | Freq: Every day | ORAL | Status: DC

## 2014-11-05 MED ORDER — INSULIN ASPART 100 UNIT/ML ~~LOC~~ SOLN
0.0000 [IU] | Freq: Every day | SUBCUTANEOUS | Status: DC
  Administered 2014-11-05: 2 [IU] via SUBCUTANEOUS

## 2014-11-05 MED ORDER — DOCUSATE SODIUM 100 MG PO CAPS: 100.0000 mg | ORAL_CAPSULE | Freq: Two times a day (BID) | ORAL | Status: DC

## 2014-11-05 MED ORDER — ENOXAPARIN SODIUM 40 MG/0.4ML ~~LOC~~ SOLN
40.0000 mg | SUBCUTANEOUS | Status: DC
  Administered 2014-11-05: 40 mg via SUBCUTANEOUS
  Filled 2014-11-05: qty 0.4

## 2014-11-05 MED ORDER — INSULIN ASPART 100 UNIT/ML ~~LOC~~ SOLN
0.0000 [IU] | Freq: Three times a day (TID) | SUBCUTANEOUS | Status: DC
  Administered 2014-11-05: 2 [IU] via SUBCUTANEOUS
  Administered 2014-11-05: 5 [IU] via SUBCUTANEOUS

## 2014-11-05 MED ORDER — CHLORHEXIDINE GLUCONATE 0.12 % MT SOLN: 15.0000 mL | Freq: Two times a day (BID) | OROMUCOSAL | Status: DC

## 2014-11-05 MED ORDER — MUPIROCIN 2 % EX OINT
1.0000 "application " | TOPICAL_OINTMENT | Freq: Two times a day (BID) | CUTANEOUS | Status: DC
  Administered 2014-11-05: 1 via NASAL
  Filled 2014-11-05: qty 22

## 2014-11-05 MED ORDER — SODIUM CHLORIDE 0.9 % IV SOLN
INTRAVENOUS | Status: AC
  Administered 2014-11-05: 02:00:00 via INTRAVENOUS

## 2014-11-05 MED ORDER — CETYLPYRIDINIUM CHLORIDE 0.05 % MT LIQD: 7.0000 mL | Freq: Two times a day (BID) | OROMUCOSAL | Status: DC

## 2014-11-05 NOTE — Progress Notes (Signed)
Pt's IV catheter removed and intact. Pt's IV site clean dry and intact. Discharge instructions and follow up appointments reviewed and discussed. Medications reviewed and discussed with patient's son. All questions answered and no further questions at this time. Pt's son wants physical therapy set up with [facility] home [facility]. Attempted fax information to [facility] but was informed that [place] does not cover patient's area in [place]. Will inform case management of patient's request. MD notified and given instruction to notify case management.

## 2014-11-05 NOTE — Discharge Summary (Signed)
Physician Discharge Summary  [place] H Collyer XIP:[id number] DOB: Apr 30, 1929 DOA: 11/04/2014  PCP: Glenda Chroman., MD  Admit date: 11/04/2014 Discharge date: 11/05/2014  Time spent: 45 minutes  Recommendations for Outpatient Follow-up:  -Will be discharged home today. -Advised to follow bowel regimen to prevent further constipation. -Advised to follow up with PCP in 2 weeks.   Discharge Diagnoses:  Principal Problem:   CN (constipation) Active Problems:   CAD (coronary artery disease)   Chronic diastolic heart failure   Chronic respiratory failure   Essential hypertension   Chronic kidney disease   Dehydration   Abdominal pain   Discharge Condition: Stable and improved  Filed Weights   11/05/14 0018 11/05/14 0500 11/05/14 1500  Weight: 88 kg (194 lb 0.1 oz) 87 kg (191 lb 12.8 oz) 86.047 kg (189 lb 11.2 oz)    History of present illness:  81 yo female with generalized abd pain most of the day today with no associated n/v. Has been very constipated today and cannot have bm. Fights chronic constipation all the time and this is what it feels like when it gets bad. No fevers. No chest pain. Has not eaten or drink well all day. Asked to admit for unclear etiology of her abdominal pain.   Hospital Course:   Abdominal Pain -2/2 constipation. -Patient states this pain is what she always has when she is constipated. -Improved after BM.  Constipation -Had BM while in the hospital. -Continue colace/miralax daily at home.  Rest of chronic conditions have been stable.  Procedures:  None   Consultations:  None  Discharge Instructions  Discharge Instructions    Diet - low sodium heart healthy    Complete by:  As directed      Increase activity slowly    Complete by:  As directed             Medication List    STOP taking these medications        HYDROcodone-acetaminophen 7.5-325 MG per tablet  Commonly known as:  NORCO     predniSONE 10 MG tablet    Commonly known as:  DELTASONE      TAKE these medications        acitretin 10 MG capsule  Commonly known as:  SORIATANE  Take 10 mg by mouth daily with supper.     amLODipine 10 MG tablet  Commonly known as:  NORVASC  Take 10 mg by mouth every morning.     aspirin 81 MG tablet  Take 81 mg by mouth daily.     atorvastatin 20 MG tablet  Commonly known as:  LIPITOR  Take 20 mg by mouth every evening.     benzonatate 100 MG capsule  Commonly known as:  TESSALON  Take 100 mg by mouth 3 (three) times daily as needed for cough.     chlorthalidone 25 MG tablet  Commonly known as:  HYGROTON  Take 1 tablet (25 mg total) by mouth daily.     clobetasol 0.05 % topical foam  Commonly known as:  OLUX  Apply to scalp daily as needed     docusate sodium 100 MG capsule  Commonly known as:  COLACE  Take 1 capsule (100 mg total) by mouth 2 (two) times daily.     feeding supplement (GLUCERNA SHAKE) Liqd  Take 237 mLs by mouth daily after supper.     fexofenadine 180 MG tablet  Commonly known as:  ALLEGRA  Take 180 mg by mouth  every evening.     fluticasone 50 MCG/ACT nasal spray  Commonly known as:  FLONASE  Place 2 sprays into both nostrils daily as needed for allergies or rhinitis.     folic acid 1 MG tablet  Commonly known as:  FOLVITE  Take 1 tablet (1 mg total) by mouth daily.     furosemide 40 MG tablet  Commonly known as:  LASIX  Take 80-120 mg by mouth every morning.     gabapentin 100 MG capsule  Commonly known as:  NEURONTIN  Take 2 capsules (200 mg total) by mouth 2 (two) times daily.     hydrALAZINE 100 MG tablet  Commonly known as:  APRESOLINE  TAKE 1 TABLET BY MOUTH THREE TIMES DAILY     insulin aspart 100 UNIT/ML injection  Commonly known as:  novoLOG  - SLIDING SCALE 0-9 Units, Injected subcutaneous, 3 times daily with meals.  - CBG < 70: implement hypoglycemia protocol  - CBG 70 - 120: 0 units  - CBG 121 - 150: 1 unit  - CBG 151 - 200: 2  units  - CBG 201 - 250: 3 units  - CBG 251 - 300: 5 units  - CBG 301 - 350: 7 units  - CBG 351 - 400: 9 units  - CBG > 400: call MD     insulin glargine 100 UNIT/ML injection  Commonly known as:  LANTUS  Inject 32 Units into the skin 2 (two) times daily.     montelukast 10 MG tablet  Commonly known as:  SINGULAIR  Take 10 mg by mouth every morning.     nitroGLYCERIN 0.4 MG SL tablet  Commonly known as:  NITROSTAT  Place 0.4 mg under the tongue every 5 (five) minutes as needed for chest pain.     pantoprazole 40 MG tablet  Commonly known as:  PROTONIX  Take 40 mg by mouth every morning.     polyethylene glycol packet  Commonly known as:  MIRALAX / GLYCOLAX  Take 17 g by mouth daily.     ranolazine 500 MG 12 hr tablet  Commonly known as:  RANEXA  Take 1 tablet (500 mg total) by mouth 2 (two) times daily.     spironolactone 25 MG tablet  Commonly known as:  ALDACTONE  Take 1 tablet (25 mg total) by mouth daily.     triamcinolone ointment 0.1 %  Commonly known as:  KENALOG  Apply 1 application topically 2 (two) times daily as needed (FOR ITCHING/IRRITATION). APPLY TO AFFECTED AREA TWICE DAILY AS NEEDED       Allergies  Allergen Reactions  . Ivp Dye [Iodinated Diagnostic Agents] Anaphylaxis       Follow-up Information    Follow up with [name],[name], MD.   Specialty:  Internal Medicine   Why:  As needed, If symptoms worsen   Contact information:   [place] [phone]        The results of significant diagnostics from this hospitalization (including imaging, microbiology, ancillary and laboratory) are listed below for reference.    Significant Diagnostic Studies: Ct Abdomen Pelvis Wo Contrast  11/04/2014   CLINICAL DATA:  Upper abdominal pain.  History of renal stones.  EXAM: CT ABDOMEN AND PELVIS WITHOUT CONTRAST  TECHNIQUE: Multidetector CT imaging of the abdomen and pelvis was performed following the standard protocol without IV  contrast.  COMPARISON:  07/08/2014  FINDINGS: The lung bases are clear. There is coronary artery atherosclerosis involving  the left main, LAD and RCA.  No renal, ureteral, or bladder calculi. No obstructive uropathy. No perinephric stranding is seen. The kidneys are symmetric in size without evidence for exophytic mass. The bladder is distended  The liver demonstrates no focal abnormality. The gallbladder is distended. The spleen demonstrates no focal abnormality. The adrenal glands and pancreas are normal.  The stomach, duodenum, small intestine and large intestine are unremarkable. There is a moderate amount of stool throughout the colon. There is no pneumoperitoneum, pneumatosis, or portal venous gas. There is no abdominal or pelvic free fluid. There is no lymphadenopathy.  The abdominal aorta is normal in caliber with atherosclerosis.  There is no lytic or sclerotic osseous lesion. There is facet arthropathy diffusely throughout the lumbar spine. There is mild osteoarthritis of bilateral hips.  IMPRESSION: 1. No urolithiasis or obstructive uropathy. 2. Distended gallbladder. 3. Moderate amount of stool throughout the colon. 4. Coronary artery atherosclerosis.   Electronically Signed   By: Kathreen Devoid   On: 11/04/2014 21:12   Dg Chest Port 1 View  11/04/2014   CLINICAL DATA:  Cough.  Cough for 1 week.  EXAM: PORTABLE CHEST - 1 VIEW  COMPARISON:  None.  FINDINGS: Cardiomegaly remains present, similar to prior exam 08/28/2014. There is pulmonary vascular congestion. Diffuse interstitial prominence is a chronic finding in this patient and probably related to repeated episodes of CHF. No focal consolidation. No effusion.  IMPRESSION: Cardiomegaly and chronic pulmonary vascular congestion. No acute abnormality or interval change.   Electronically Signed   By: Dereck Ligas M.D.   On: 11/04/2014 18:38    Microbiology: Recent Results (from the past 240 hour(s))  MRSA PCR Screening     Status: Abnormal    Collection Time: 11/05/14  1:15 AM  Result Value Ref Range Status   MRSA by PCR POSITIVE (A) NEGATIVE Final    Comment:        The GeneXpert MRSA Assay (FDA approved for NASAL specimens only), is one component of a comprehensive MRSA colonization surveillance program. It is not intended to diagnose MRSA infection nor to guide or monitor treatment for MRSA infections. RESULT CALLED TO, READ BACK BY AND VERIFIED WITH: CUMMINGS V AT 0408 ON [id number] BY FORSYTH K      Labs: Basic Metabolic Panel:  Recent Labs Lab 11/04/14 2035 11/05/14 0621  NA 130* 133*  K 4.0 3.4*  CL 87* 89*  CO2 30 32  GLUCOSE 229* 144*  BUN 64* 64*  CREATININE 1.26* 1.27*  CALCIUM 11.3* 11.1*   Liver Function Tests:  Recent Labs Lab 11/04/14 2035  AST 31  ALT 38*  ALKPHOS 67  BILITOT 0.6  PROT 9.1*  ALBUMIN 4.6    Recent Labs Lab 11/04/14 2035  LIPASE 30   No results for input(s): AMMONIA in the last 168 hours. CBC:  Recent Labs Lab 11/04/14 2035 11/05/14 0621  WBC 13.2* 13.8*  NEUTROABS 11.1*  --   HGB 13.4 13.0  HCT 41.7 40.1  MCV 80.5 80.8  PLT 329 318   Cardiac Enzymes:  Recent Labs Lab 11/04/14 2035 11/05/14 0621  TROPONINI 0.05* 0.06*   BNP: BNP (last 3 results)  Recent Labs  08/28/14 1944 08/29/14 0407  BNP 1440.0* 994.0*    ProBNP (last 3 results) No results for input(s): PROBNP in the last 8760 hours.  CBG:  Recent Labs Lab 11/04/14 2045 11/05/14 0028 11/05/14 0821 11/05/14 1136 11/05/14 1611  GLUCAP 214* 239* 112* 279* 198*  SignedLelon Frohlich  [facility] Pager: [phone] 11/05/2014, 4:49 PM

## 2014-11-05 NOTE — ED Provider Notes (Signed)
EKG Interpretation  Date/Time:  Tuesday November 04 2014 23:37:57 EST Ventricular Rate:  76 PR Interval:  153 QRS Duration: 100 QT Interval:  421 QTC Calculation: 473 R Axis:   0 Text Interpretation:  Sinus rhythm Probable left atrial enlargement LVH with secondary repolarization abnormality Sinus rhythm ST-t wave abnormality Left ventricular hypertrophy No significant change since last tracing Abnormal ekg Confirmed by Carmin Muskrat  MD ([id number]) on 11/05/2014 12:02:53 AM        Carmin Muskrat, MD 11/05/14 0003

## 2014-11-05 NOTE — Progress Notes (Signed)
Spoke with patient regarding diabetes and home regimen for diabetes control. Patient states that she checks her glucose at least twice per day. Patient states that she takes Lantus 32 units BID if her "glucose is high; above 200 mg/dl and rarely takes the Novolog" correction scale. According to the patient over the past 2 weeks her glucose has ranged from 80's to 200's mg/dl. She reports that her glucose tends to be the lowest first thing in the morning. Patient reports that for the most part she takes the Lantus twice a day. Inquired about whether patient took Lantus the morning before she came to the hospital yesterday evening. Patient states that she does not think that she took any Lantus yesterday morning. Initial glucose was 229 mg/dl at 20:35 on 3/9. Patient only received Novolog 2 units for correction at 2:16 am on 3/9 for CBG of 239 mg/dl. Fasting glucose is 112 mg/dl this morning. Current inpatient orders for glycemic control: Novolog 0-9 units TID with meals and Novolog 0-5 units HS. During last hospitalization from 08/28/14 to 08/30/14, patient was ordered Lantus 26 units BID, Novolog 0-15 TID with meals, Novolog 0-5 units HS for inpatient glycemic control. Will continue to follow and make further recommendations as more data is collected.  Thanks, Barnie Alderman, RN, MSN, CCRN, CDE Diabetes Coordinator Inpatient Diabetes Program [phone] (Team Pager) [phone] ([facility] office) [phone] [facility] office)

## 2014-11-05 NOTE — Care Management Note (Signed)
    Page 1 of 1   11/05/2014     9:56:49 AM CARE MANAGEMENT NOTE 11/05/2014  Patient:  Theresa Mclean, Theresa Mclean   Account Number:  [id number]  Date Initiated:  11/05/2014  Documentation initiated by:  Jolene Provost  Subjective/Objective Assessment:   Pt is from home, lives with son. Pt does not ambulate in home, uses a wheelchair to get into lift recliner. Pt has CAP aid that comes daily. Pt has home O2 and CPAP. Pt plans to discharge home with resumption of previous arrangment.     Action/Plan:   No CM needs identified. Will cont to follow.   Anticipated DC Date:  11/06/2014   Anticipated DC Plan:  [place]  CM consult      [facility] Choice  Resumption Of Svcs/PTA [name]   Choice offered to / List presented to:             Status of service:  In process, will continue to follow Medicare Important Message given?   (If response is "NO", the following Medicare IM given date fields will be blank) Date Medicare IM given:   Medicare IM given by:   Date Additional Medicare IM given:   Additional Medicare IM given by:    Discharge Disposition:  HOME/SELF CARE  Per UR Regulation:  Reviewed for med. necessity/level of care/duration of stay  If discussed at [place] of Stay Meetings, dates discussed:    Comments:  11/05/2014 0900 Jolene Provost, RN, MSN, CM

## 2014-11-05 NOTE — Progress Notes (Signed)
INITIAL NUTRITION ASSESSMENT  DOCUMENTATION CODES Per approved criteria  -Obesity Unspecified   INTERVENTION: Ensure Complete po BID, each supplement provides 350 kcal and 13 grams of protein   Provided diet education: "Tips for adding fiber"  Recommend Mechanical Soft diet/Heart Healthy when pt is ready to advance (due to chewing difficulty)  NUTRITION DIAGNOSIS: Predicted suboptimal protein intake related to chronic constipation and poor dentition as evidenced by diet recall  and 11% wt loss over past 4 months.    Goal: Pt to meet >/= 90% of their estimated nutrition needs    Monitor:  Diet advancement, pt intake of protein foods, labs   Reason for Assessment: Malnutrition Screen   81 y.o. female   ASSESSMENT: Pt has hx of chronic constipation. She lives with her son and he and pt's caregiver prepare meals daily for her (i.e. oatmeal for breakfast with orange juice and veggies at lunch / dinner. Pt says she is not a "meat eater". She has dentures but doesn't have them in today. At home she occasionally drinks an oral nutrition supplement such as Boost but doesn't like milk. She is able to feed herself but her mobility is limited to wheelchair. Her weight has decreased by 11% in past 4 months. Pt says she just has not been hungry.  Nutrition focused physical exam: well-nourished, no observed signs of malnutrition   Abnormal Labs: Sodium-133, Potassium-3.4, Chloride-89,  Height: Ht Readings from Last 1 Encounters:  11/05/14 5\' 4"  (1.626 m)    Weight: Wt Readings from Last 1 Encounters:  11/05/14 191 lb 12.8 oz (87 kg)    Ideal Body Weight: 120#  % Ideal Body Weight: 160%  Wt Readings from Last 10 Encounters:  11/05/14 191 lb 12.8 oz (87 kg)  08/30/14 203 lb 7.8 oz (92.3 kg)  07/31/14 209 lb (94.802 kg)  07/13/14 206 lb 6.4 oz (93.622 kg)  05/26/14 219 lb (99.338 kg)  02/07/14 213 lb (96.616 kg)  11/24/13 216 lb (97.977 kg)  11/14/13 216 lb (97.977 kg)   08/16/13 222 lb 14.2 oz (101.1 kg)  07/12/13 220 lb 12.8 oz (100.154 kg)    Usual Body Weight: 215-220#  % Usual Body Weight: 89%  BMI:  Body mass index is 32.91 kg/(m^2). obesity class I  Estimated Nutritional Needs: Kcal: 1318-1450 Protein: 87-96 gr Fluid: >1800 ml daily  Skin: intact  Diet Order: Diet full liquid  EDUCATION NEEDS: -Education needs addressed   Intake/Output Summary (Last 24 hours) at 11/05/14 0936 Last data filed at 11/05/14 0900  Gross per 24 hour  Intake    250 ml  Output    300 ml  Net    -50 ml    Last BM: 10/31/14   Labs:   Recent Labs Lab 11/04/14 2035 11/05/14 0621  NA 130* 133*  K 4.0 3.4*  CL 87* 89*  CO2 30 32  BUN 64* 64*  CREATININE 1.26* 1.27*  CALCIUM 11.3* 11.1*  GLUCOSE 229* 144*    CBG (last 3)   Recent Labs  11/04/14 2045 11/05/14 0028 11/05/14 0821  GLUCAP 214* 239* 112*    Scheduled Meds: . Chlorhexidine Gluconate Cloth  6 each Topical Q0600  . enoxaparin (LOVENOX) injection  40 mg Subcutaneous Q24H  . feeding supplement (ENSURE COMPLETE)  237 mL Oral BID BM  . insulin aspart  0-5 Units Subcutaneous QHS  . insulin aspart  0-9 Units Subcutaneous TID WC  . mupirocin ointment  1 application Nasal BID    Continuous Infusions: .  sodium chloride 75 mL/hr at 11/05/14 0216    Past Medical History  Diagnosis Date  . Hypertension   . Nephrolithiasis   . GERD (gastroesophageal reflux disease)   . Arthritis   . Psoriasis   . Morbid obesity   . Mitral regurgitation   . Hyperlipidemia   . Sleep apnea     uses cpap  . CHF (congestive heart failure)     Diastolic. EF 55-65% by cath 10/11/11  . Diabetes mellitus     insulin dependent  . Acute renal insufficiency     09/2011. not on ACEI secondary to this (also has renal artery stenosis)  . Peripheral vascular disease     severe right renal artery stenosis and left SFA stenosis by PV angio 09/2011, treated medically  . Cellulitis   . CAD (coronary artery  disease)     NSTEMI 09/2011 felt poor candidate for CABG, instead s/p PTCA/DES to mid RCA 10/17/11  . Chronic respiratory failure   . Pulmonary hypertension   . Obstructive sleep apnea   . CKD (chronic kidney disease) stage 2, GFR 60-89 ml/min 07/11/2013  . Vitamin D deficiency     Past Surgical History  Procedure Laterality Date  . [place]-clavicular joint repair  2011  . Abdominal hysterectomy    . Eye surgery      cataract  OD  . Lower extremity angiogram N/A 10/11/2011    Procedure: LOWER EXTREMITY ANGIOGRAM;  Surgeon: Sherren Mocha, MD;  Location: [facility] CATH LAB;  Service: Cardiovascular;  Laterality: N/A;  . Left and right heart catheterization with coronary angiogram N/A 10/11/2011    Procedure: LEFT AND RIGHT HEART CATHETERIZATION WITH CORONARY ANGIOGRAM;  Surgeon: Sherren Mocha, MD;  Location: [facility] CATH LAB;  Service: Cardiovascular;  Laterality: N/A;  . Percutaneous coronary stent intervention (pci-s) N/A 10/17/2011    Procedure: PERCUTANEOUS CORONARY STENT INTERVENTION (PCI-S);  Surgeon: Burnell Blanks, MD;  Location: [facility] CATH LAB;  Service: Cardiovascular;  Laterality: N/A;    Colman Cater MS,RD,CSG,LDN Office: [phone] Pager: [phone]

## 2014-11-05 NOTE — Care Management Utilization Note (Signed)
UR completed 

## 2014-11-05 NOTE — H&P (Signed)
PCP:   Glenda Chroman., MD   Chief Complaint:  abd pain  HPI: 81 yo female with generalized abd pain most of the day today with no associated n/v.  Has been very constipated today and cannot have bm.  Fights chronic constipation all the time and this is what it feels like when it gets bad.  No fevers.  No chest pain.  Has not eaten or drink well all day.  Asked to admit for unclear etiology of her abdominal pain.  Review of Systems:  Positive and negative as per HPI otherwise all other systems are negative  Past Medical History: Past Medical History  Diagnosis Date  . Hypertension   . Nephrolithiasis   . GERD (gastroesophageal reflux disease)   . Arthritis   . Psoriasis   . Morbid obesity   . Mitral regurgitation   . Hyperlipidemia   . Sleep apnea     uses cpap  . CHF (congestive heart failure)     Diastolic. EF 55-65% by cath 10/11/11  . Diabetes mellitus     insulin dependent  . Acute renal insufficiency     09/2011. not on ACEI secondary to this (also has renal artery stenosis)  . Peripheral vascular disease     severe right renal artery stenosis and left SFA stenosis by PV angio 09/2011, treated medically  . Cellulitis   . CAD (coronary artery disease)     NSTEMI 09/2011 felt poor candidate for CABG, instead s/p PTCA/DES to mid RCA 10/17/11  . Chronic respiratory failure   . Pulmonary hypertension   . Obstructive sleep apnea   . CKD (chronic kidney disease) stage 2, GFR 60-89 ml/min 07/11/2013  . Vitamin D deficiency    Past Surgical History  Procedure Laterality Date  . Acromio-clavicular joint repair  2011  . Abdominal hysterectomy    . Eye surgery      cataract  OD  . Lower extremity angiogram N/A 10/11/2011    Procedure: LOWER EXTREMITY ANGIOGRAM;  Surgeon: Sherren Mocha, MD;  Location: [facility] CATH LAB;  Service: Cardiovascular;  Laterality: N/A;  . Left and right heart catheterization with coronary angiogram N/A 10/11/2011    Procedure: LEFT AND RIGHT HEART  CATHETERIZATION WITH CORONARY ANGIOGRAM;  Surgeon: Sherren Mocha, MD;  Location: [facility] CATH LAB;  Service: Cardiovascular;  Laterality: N/A;  . Percutaneous coronary stent intervention (pci-s) N/A 10/17/2011    Procedure: PERCUTANEOUS CORONARY STENT INTERVENTION (PCI-S);  Surgeon: Burnell Blanks, MD;  Location: [facility] CATH LAB;  Service: Cardiovascular;  Laterality: N/A;    Medications: Prior to Admission medications   Medication Sig Start Date End Date Taking? Authorizing Provider  acitretin (SORIATANE) 10 MG capsule Take 10 mg by mouth daily with supper. 08/01/13  Yes Historical Provider, MD  amLODipine (NORVASC) 10 MG tablet Take 10 mg by mouth every morning.    Yes Historical Provider, MD  aspirin 81 MG tablet Take 81 mg by mouth daily.   Yes Historical Provider, MD  atorvastatin (LIPITOR) 20 MG tablet Take 20 mg by mouth every evening.    Yes Historical Provider, MD  benzonatate (TESSALON) 100 MG capsule Take 100 mg by mouth 3 (three) times daily as needed for cough.   Yes Historical Provider, MD  chlorthalidone (HYGROTON) 25 MG tablet Take 1 tablet (25 mg total) by mouth daily. Patient taking differently: Take 25 mg by mouth every morning.  09/05/14  Yes Herminio Commons, MD  clobetasol (OLUX) 0.05 % topical foam Apply to scalp daily as  needed 04/24/13  Yes Historical Provider, MD  feeding supplement, GLUCERNA SHAKE, (GLUCERNA SHAKE) LIQD Take 237 mLs by mouth daily after supper. 07/14/14  Yes Geradine Girt, DO  fexofenadine (ALLEGRA) 180 MG tablet Take 180 mg by mouth every evening.   Yes Historical Provider, MD  fluticasone (FLONASE) 50 MCG/ACT nasal spray Place 2 sprays into both nostrils daily as needed for allergies or rhinitis.   Yes Historical Provider, MD  folic acid (FOLVITE) 1 MG tablet Take 1 tablet (1 mg total) by mouth daily. 08/30/14  Yes Nita Sells, MD  furosemide (LASIX) 40 MG tablet Take 80-120 mg by mouth every morning.   Yes Historical Provider, MD  gabapentin  (NEURONTIN) 100 MG capsule Take 2 capsules (200 mg total) by mouth 2 (two) times daily. 07/14/14  Yes Geradine Girt, DO  hydrALAZINE (APRESOLINE) 100 MG tablet TAKE 1 TABLET BY MOUTH THREE TIMES DAILY 08/11/14  Yes Herminio Commons, MD  insulin aspart (NOVOLOG) 100 UNIT/ML injection SLIDING SCALE 0-9 Units, Injected subcutaneous, 3 times daily with meals. CBG < 70: implement hypoglycemia protocol CBG 70 - 120: 0 units CBG 121 - 150: 1 unit CBG 151 - 200: 2 units CBG 201 - 250: 3 units CBG 251 - 300: 5 units CBG 301 - 350: 7 units CBG 351 - 400: 9 units CBG > 400: call MD Patient taking differently: Inject 0-9 Units into the skin 3 (three) times daily with meals. SLIDING SCALE 0-9 Units, Injected subcutaneous, 3 times daily with meals. CBG < 70: implement hypoglycemia protocol CBG 70 - 120: 0 units CBG 121 - 150: 1 unit CBG 151 - 200: 2 units CBG 201 - 250: 3 units CBG 251 - 300: 5 units CBG 301 - 350: 7 units CBG 351 - 400: 9 units CBG > 400: call MD 10/20/11  Yes Dayna N Dunn, PA-C  insulin glargine (LANTUS) 100 UNIT/ML injection Inject 60 Units into the skin 2 (two) times daily.    Yes Historical Provider, MD  montelukast (SINGULAIR) 10 MG tablet Take 10 mg by mouth every morning.    Yes Historical Provider, MD  nitroGLYCERIN (NITROSTAT) 0.4 MG SL tablet Place 0.4 mg under the tongue every 5 (five) minutes as needed for chest pain.   Yes Historical Provider, MD  pantoprazole (PROTONIX) 40 MG tablet Take 40 mg by mouth every morning.    Yes Historical Provider, MD  predniSONE (DELTASONE) 10 MG tablet Take 10 mg by mouth 2 (two) times daily. 7 day course start on 10/31/2014   Yes Historical Provider, MD  ranolazine (RANEXA) 500 MG 12 hr tablet Take 1 tablet (500 mg total) by mouth 2 (two) times daily. 05/26/14  Yes Herminio Commons, MD  spironolactone (ALDACTONE) 25 MG tablet Take 1 tablet (25 mg total) by mouth daily. 08/16/13  Yes Kathie Dike, MD  triamcinolone ointment (KENALOG)  0.1 % Apply 1 application topically 2 (two) times daily as needed (FOR ITCHING/IRRITATION). APPLY TO AFFECTED AREA TWICE DAILY AS NEEDED 10/30/14  Yes Historical Provider, MD  HYDROcodone-acetaminophen (Abrams) 7.5-325 MG per tablet Take 1 tablet by mouth every 6 (six) hours as needed for moderate pain.    Historical Provider, MD    Allergies:   Allergies  Allergen Reactions  . Ivp Dye [Iodinated Diagnostic Agents] Anaphylaxis    Social History:  reports that she has never smoked. She has never used smokeless tobacco. She reports that she does not drink alcohol or use illicit drugs.  Family History: Family History  Problem Relation Age of Onset  . Psoriasis Brother     Physical Exam: Filed Vitals:   11/04/14 1900 11/04/14 2046 11/04/14 2247 11/05/14 0018  BP:  169/64 153/46 173/57  Pulse: 71 72 77 76  Temp:  97.7 F (36.5 C) 98 F (36.7 C) 98.3 F (36.8 C)  TempSrc:  Oral Oral Oral  Resp:  20  20  Height:    5\' 4"  (1.626 m)  Weight:    88 kg (194 lb 0.1 oz)  SpO2: 98% 97% 94% 99%   General appearance: alert, cooperative and no distress Head: Normocephalic, without obvious abnormality, atraumatic Eyes: negative Nose: Nares normal. Septum midline. Mucosa normal. No drainage or sinus tenderness. Neck: no JVD and supple, symmetrical, trachea midline Lungs: clear to auscultation bilaterally Heart: regular rate and rhythm, S1, S2 normal, no murmur, click, rub or gallop Abdomen: soft, non-tender; bowel sounds normal; no masses,  no organomegaly Extremities: extremities normal, atraumatic, no cyanosis or edema  Mild ttp generalized Pulses: 2+ and symmetric Skin: Skin color, texture, turgor normal. No rashes or lesions Neurologic: Grossly normal    Labs on Admission:   Recent Labs  11/04/14 2035  NA 130*  K 4.0  CL 87*  CO2 30  GLUCOSE 229*  BUN 64*  CREATININE 1.26*  CALCIUM 11.3*    Recent Labs  11/04/14 2035  AST 31  ALT 38*  ALKPHOS 67  BILITOT 0.6   PROT 9.1*  ALBUMIN 4.6    Recent Labs  11/04/14 2035  LIPASE 30    Recent Labs  11/04/14 2035  WBC 13.2*  NEUTROABS 11.1*  HGB 13.4  HCT 41.7  MCV 80.5  PLT 329    Recent Labs  11/04/14 2035  TROPONINI 0.05*   Radiological Exams on Admission: Ct Abdomen Pelvis Wo Contrast  11/04/2014   CLINICAL DATA:  Upper abdominal pain.  History of renal stones.  EXAM: CT ABDOMEN AND PELVIS WITHOUT CONTRAST  TECHNIQUE: Multidetector CT imaging of the abdomen and pelvis was performed following the standard protocol without IV contrast.  COMPARISON:  07/08/2014  FINDINGS: The lung bases are clear. There is coronary artery atherosclerosis involving the left main, LAD and RCA.  No renal, ureteral, or bladder calculi. No obstructive uropathy. No perinephric stranding is seen. The kidneys are symmetric in size without evidence for exophytic mass. The bladder is distended  The liver demonstrates no focal abnormality. The gallbladder is distended. The spleen demonstrates no focal abnormality. The adrenal glands and pancreas are normal.  The stomach, duodenum, small intestine and large intestine are unremarkable. There is a moderate amount of stool throughout the colon. There is no pneumoperitoneum, pneumatosis, or portal venous gas. There is no abdominal or pelvic free fluid. There is no lymphadenopathy.  The abdominal aorta is normal in caliber with atherosclerosis.  There is no lytic or sclerotic osseous lesion. There is facet arthropathy diffusely throughout the lumbar spine. There is mild osteoarthritis of bilateral hips.  IMPRESSION: 1. No urolithiasis or obstructive uropathy. 2. Distended gallbladder. 3. Moderate amount of stool throughout the colon. 4. Coronary artery atherosclerosis.   Electronically Signed   By: Kathreen Devoid   On: 11/04/2014 21:12   Dg Chest Port 1 View  11/04/2014   CLINICAL DATA:  Cough.  Cough for 1 week.  EXAM: PORTABLE CHEST - 1 VIEW  COMPARISON:  None.  FINDINGS: Cardiomegaly  remains present, similar to prior exam 08/28/2014. There is pulmonary vascular congestion. Diffuse interstitial prominence is a chronic finding in  this patient and probably related to repeated episodes of CHF. No focal consolidation. No effusion.  IMPRESSION: Cardiomegaly and chronic pulmonary vascular congestion. No acute abnormality or interval change.   Electronically Signed   By: Dereck Ligas M.D.   On: 11/04/2014 18:38    Assessment/Plan  81 yo female with generalized abd pain likely from constipation  Principal Problem:   CN (constipation)-  Enema.    Active Problems:  Stable unless o/w noted   CAD (coronary artery disease)   Chronic diastolic heart failure-  Hold diuretics overnight   Chronic respiratory failure   Essential hypertension   Chronic kidney disease   Dehydration-  Gentle ivf overnight   Abdominal pain-  Per EDP pain was in epigastrum and concerns for other etiology of her pain, ck abd u/s to assess gallbladder but pt reports this is consistent with her chronic constipation issues.  Full code.  Hagen Tidd A 11/05/2014, 1:37 AM

## 2014-11-06 ENCOUNTER — Other Ambulatory Visit (HOSPITAL_COMMUNITY): Payer: Self-pay | Admitting: Family Medicine

## 2014-11-06 DIAGNOSIS — R1084 Generalized abdominal pain: Secondary | ICD-10-CM

## 2014-11-06 NOTE — Care Management Note (Signed)
Contacted by discharging RN and notified [place] had been ordered before discharge the previous day and could not be arranged. Pt's family had requested [facility]. [facility] called (spoke with Aniceto Boss) and faxed [place] ordere, face to face and face sheet. PT will be initiated in the next week. Pt's son [name]) called and notified that referral had been completed and the [facility] agency would be in contact with them to arrange an appointment.

## 2014-11-25 DIAGNOSIS — D472 Monoclonal gammopathy: Secondary | ICD-10-CM | POA: Insufficient documentation

## 2014-11-25 DIAGNOSIS — E611 Iron deficiency: Secondary | ICD-10-CM | POA: Insufficient documentation

## 2014-12-08 ENCOUNTER — Other Ambulatory Visit: Payer: Self-pay | Admitting: Cardiovascular Disease

## 2014-12-24 ENCOUNTER — Telehealth: Payer: Self-pay | Admitting: Cardiovascular Disease

## 2014-12-24 NOTE — Telephone Encounter (Signed)
Told patient to tell Dr Bronson Ing to contact them in reference to her needing to see a vascular doctor

## 2014-12-24 NOTE — Telephone Encounter (Signed)
Will forward to Dr. Bronson Ing  DR Blanch Media [phone]

## 2014-12-24 NOTE — Telephone Encounter (Signed)
Spoke with [facility] center and they wanted fax # to send MD pertaining to pt seeing seeing Dr. Earleen Newport. Will forward to Dr. Bronson Ing as Juluis Rainier

## 2014-12-25 ENCOUNTER — Other Ambulatory Visit: Payer: Self-pay | Admitting: Podiatry

## 2014-12-25 DIAGNOSIS — I739 Peripheral vascular disease, unspecified: Secondary | ICD-10-CM

## 2015-01-08 ENCOUNTER — Other Ambulatory Visit: Payer: Self-pay | Admitting: *Deleted

## 2015-01-08 ENCOUNTER — Other Ambulatory Visit: Payer: Self-pay | Admitting: Interventional Radiology

## 2015-01-08 ENCOUNTER — Ambulatory Visit
Admission: RE | Admit: 2015-01-08 | Discharge: 2015-01-08 | Disposition: A | Discharge status: Home / Self Care | Payer: Medicare Other | Source: Ambulatory Visit | Attending: Podiatry | Admitting: Podiatry

## 2015-01-08 DIAGNOSIS — I739 Peripheral vascular disease, unspecified: Secondary | ICD-10-CM

## 2015-01-08 NOTE — Progress Notes (Signed)
13-hour prep instructions given to Danton Sewer, patient's son, for CT angio w/ runoff scheduled at [facility] on Wednesday, Jan 14, 2015.  Patient is to arrive at 1245 for 1320 scan and is to NPO four hours prior to scan.  I told [name] to give Prednisone doses with as little water as possible to get the pills down.  He understands the prep doses are of Prednisone 50mg  PO on Wednesday, Jan 14, 2015 at 0020, 0620 and 1220 and that the patient is to take Benadryl 50mg  PO with the last dose of Prednisone at 1220.  Brita Romp, RN

## 2015-01-08 NOTE — Consult Note (Signed)
Chief Complaint: New left foot wound.   Referring Physician(s): Dr. Cristal Deer  History of Present Illness: Theresa Mclean is a 81 y.o. female presenting today to interventional radiology clinic for evaluation of a new left foot wound, kindly referred by her podiatry physician Dr. Irving Shows.  [name] was interviewed with her son present, as well as her caretaker present. Both Ms. Theresa Mclean and her son provided medical history. She has had a new ulcer form on the plantar aspect at the base of her left great toe, approximately the size of a half-dollar. The ulcer has been present for approximately 2 months, and her son states that it has been getting worse despite excellent wound care. She has completed a prior course of antibiotics, and currently denies any fevers writers or chills. There is no drainage from the wound that would be suspicious for an underlying abscess or significant cellulitis.  This is the first wound that the patient has experienced, with no prior left or right-sided wounds. The patient denies any resting leg pain of the right or left lower extremity. The patient is non-ambulatory. She does have long-standing diabetes, for which she is treated with insulin.    Past Medical History  Diagnosis Date  . Hypertension   . Nephrolithiasis   . GERD (gastroesophageal reflux disease)   . Arthritis   . Psoriasis   . Morbid obesity   . Mitral regurgitation   . Hyperlipidemia   . Sleep apnea     uses cpap  . CHF (congestive heart failure)     Diastolic. EF 55-65% by cath 10/11/11  . Diabetes mellitus     insulin dependent  . Acute renal insufficiency     09/2011. not on ACEI secondary to this (also has renal artery stenosis)  . Peripheral vascular disease     severe right renal artery stenosis and left SFA stenosis by PV angio 09/2011, treated medically  . Cellulitis   . CAD (coronary artery disease)     NSTEMI 09/2011 felt poor candidate for CABG, instead s/p  PTCA/DES to mid RCA 10/17/11  . Chronic respiratory failure   . Pulmonary hypertension   . Obstructive sleep apnea   . CKD (chronic kidney disease) stage 2, GFR 60-89 ml/min 07/11/2013  . Vitamin D deficiency     Past Surgical History  Procedure Laterality Date  . Acromio-clavicular joint repair  2011  . Abdominal hysterectomy    . Eye surgery      cataract  OD  . Lower extremity angiogram N/A 10/11/2011    Procedure: LOWER EXTREMITY ANGIOGRAM;  Surgeon: Sherren Mocha, MD;  Location: [facility] CATH LAB;  Service: Cardiovascular;  Laterality: N/A;  . Left and right heart catheterization with coronary angiogram N/A 10/11/2011    Procedure: LEFT AND RIGHT HEART CATHETERIZATION WITH CORONARY ANGIOGRAM;  Surgeon: Sherren Mocha, MD;  Location: [facility] CATH LAB;  Service: Cardiovascular;  Laterality: N/A;  . Percutaneous coronary stent intervention (pci-s) N/A 10/17/2011    Procedure: PERCUTANEOUS CORONARY STENT INTERVENTION (PCI-S);  Surgeon: Burnell Blanks, MD;  Location: [facility] CATH LAB;  Service: Cardiovascular;  Laterality: N/A;    Allergies: Ivp dye  Medications: Prior to Admission medications   Medication Sig Start Date End Date Taking? Authorizing Provider  acitretin (SORIATANE) 10 MG capsule Take 10 mg by mouth daily with supper. 08/01/13  Yes Historical Provider, MD  amLODipine (NORVASC) 10 MG tablet Take 10 mg by mouth every morning.    Yes Historical Provider, MD  aspirin 81 MG tablet Take 81 mg by mouth daily.   Yes Historical Provider, MD  atorvastatin (LIPITOR) 20 MG tablet Take 20 mg by mouth every evening.    Yes Historical Provider, MD  benzonatate (TESSALON) 100 MG capsule Take 100 mg by mouth 3 (three) times daily as needed for cough.   Yes Historical Provider, MD  chlorthalidone (HYGROTON) 25 MG tablet Take 1 tablet (25 mg total) by mouth daily. Patient taking differently: Take 25 mg by mouth every morning.  09/05/14  Yes Herminio Commons, MD  clobetasol (OLUX) 0.05 % topical  foam Apply to scalp daily as needed 04/24/13  Yes Historical Provider, MD  docusate sodium (COLACE) 100 MG capsule Take 1 capsule (100 mg total) by mouth 2 (two) times daily. 11/05/14  Yes Erline Hau, MD  feeding supplement, GLUCERNA SHAKE, (GLUCERNA SHAKE) LIQD Take 237 mLs by mouth daily after supper. 07/14/14  Yes Geradine Girt, DO  fexofenadine (ALLEGRA) 180 MG tablet Take 180 mg by mouth every evening.   Yes Historical Provider, MD  fluticasone (FLONASE) 50 MCG/ACT nasal spray Place 2 sprays into both nostrils daily as needed for allergies or rhinitis.   Yes Historical Provider, MD  folic acid (FOLVITE) 1 MG tablet Take 1 tablet (1 mg total) by mouth daily. 08/30/14  Yes Nita Sells, MD  furosemide (LASIX) 40 MG tablet Take 80-120 mg by mouth every morning.   Yes Historical Provider, MD  gabapentin (NEURONTIN) 100 MG capsule Take 2 capsules (200 mg total) by mouth 2 (two) times daily. 07/14/14  Yes Geradine Girt, DO  hydrALAZINE (APRESOLINE) 100 MG tablet TAKE 1 TABLET BY MOUTH THREE TIMES DAILY 12/08/14  Yes Herminio Commons, MD  insulin aspart (NOVOLOG) 100 UNIT/ML injection SLIDING SCALE 0-9 Units, Injected subcutaneous, 3 times daily with meals. CBG < 70: implement hypoglycemia protocol CBG 70 - 120: 0 units CBG 121 - 150: 1 unit CBG 151 - 200: 2 units CBG 201 - 250: 3 units CBG 251 - 300: 5 units CBG 301 - 350: 7 units CBG 351 - 400: 9 units CBG > 400: call MD Patient taking differently: Inject 0-9 Units into the skin 3 (three) times daily with meals. SLIDING SCALE 0-9 Units, Injected subcutaneous, 3 times daily with meals. CBG < 70: implement hypoglycemia protocol CBG 70 - 120: 0 units CBG 121 - 150: 1 unit CBG 151 - 200: 2 units CBG 201 - 250: 3 units CBG 251 - 300: 5 units CBG 301 - 350: 7 units CBG 351 - 400: 9 units CBG > 400: call MD 10/20/11  Yes Dayna N Dunn, PA-C  insulin glargine (LANTUS) 100 UNIT/ML injection Inject 32 Units into the skin 2 (two)  times daily.    Yes Historical Provider, MD  montelukast (SINGULAIR) 10 MG tablet Take 10 mg by mouth every morning.    Yes Historical Provider, MD  nitroGLYCERIN (NITROSTAT) 0.4 MG SL tablet Place 0.4 mg under the tongue every 5 (five) minutes as needed for chest pain.   Yes Historical Provider, MD  pantoprazole (PROTONIX) 40 MG tablet Take 40 mg by mouth every morning.    Yes Historical Provider, MD  polyethylene glycol (MIRALAX / GLYCOLAX) packet Take 17 g by mouth daily. 11/05/14  Yes Estela Leonie Green, MD  RANEXA 500 MG 12 hr tablet TAKE 1 TABLET BY MOUTH TWICE DAILY 12/08/14  Yes Herminio Commons, MD  ranolazine (RANEXA) 500 MG 12 hr tablet Take 1 tablet (500  mg total) by mouth 2 (two) times daily. 05/26/14  Yes Herminio Commons, MD  silver sulfADIAZINE (SILVADENE) 1 % cream Apply 1 application topically daily.   Yes Historical Provider, MD  spironolactone (ALDACTONE) 25 MG tablet Take 1 tablet (25 mg total) by mouth daily. 08/16/13  Yes Kathie Dike, MD  triamcinolone ointment (KENALOG) 0.1 % Apply 1 application topically 2 (two) times daily as needed (FOR ITCHING/IRRITATION). APPLY TO AFFECTED AREA TWICE DAILY AS NEEDED 10/30/14  Yes Historical Provider, MD  ergocalciferol (VITAMIN D2) 50000 UNITS capsule Take 50,000 Units by mouth.    Historical Provider, MD  HYDROcodone-acetaminophen [facility]) 7.5-325 MG per tablet  10/23/14   Historical Provider, MD     Family History  Problem Relation Age of Onset  . Psoriasis Brother     History   Social History  . Marital Status: Divorced    Spouse Name: N/A  . Number of Children: N/A  . Years of Education: N/A   Social History Main Topics  . Smoking status: Never Smoker   . Smokeless tobacco: Never Used  . Alcohol Use: No  . Drug Use: No  . Sexual Activity: No   Other Topics Concern  . None   Social History Narrative     Review of Systems: A 12 point ROS discussed and pertinent positives are indicated in the HPI above.   All other systems are negative.  Review of Systems  Vital Signs: BP 148/66 mmHg  Pulse 56  Temp(Src) 97.5 F (36.4 C)  Resp 16  SpO2 99%  LMP 08/29/1990  Physical Exam  Atraumatic, normocephalic, mucous membranes moist pink. Conjugate gaze. No icterus or scleral injection. Neck is full, soft, supple. No adenopathy. Chest movement is symmetric on inspiration and expiration. No labored breathing. Abdomen is obese, soft nontender. No muscle guarding or rigidity. Next signed genitourinary deferred. Patient is moving all 4 extremities, with gross sensory intact in her upper extremities. She reports normal sensation in her lower extremities. Palpable pulses of the bilateral radial. Palpable bilateral popliteal pulse. Nonpalpable dorsalis pedis or posterior tibial pulses. There is audible arterial signal with Doppler of the right and left dorsalis pedis distally, though is monophasic. No Doppler signal of the posterior tibial arteries. Ulceration at the plantar aspect of the left great toe, overlying the head of the metatarsal. The size is approximately half-dollar, with rounded margins. No fluid drainage or significant erythema. No purulent discharge. No other wounds are evident. The remainder of the skin of the left foot and the skin of the right foot intact.   Imaging: No results found.  Labs:  CBC:  Recent Labs  08/29/14 0407 08/30/14 0600 11/04/14 2035 11/05/14 0621  WBC 11.6* 7.0 13.2* 13.8*  HGB 11.4* 9.9* 13.4 13.0  HCT 35.5* 31.2* 41.7 40.1  PLT 230 232 329 318    COAGS:  Recent Labs  07/09/14 0415  INR 1.11  APTT 23*    BMP:  Recent Labs  08/30/14 0600 08/30/14 1536 09/08/14 1158 11/04/14 2035 11/05/14 0621  NA 134* 132* 137 130* 133*  K 4.0 4.7 4.6 4.0 3.4*  CL 97 95* 103 87* 89*  CO2 29 28 26 30  32  GLUCOSE 114* 258* 136* 229* 144*  BUN 42* 40* 34* 64* 64*  CALCIUM 10.5 10.3 10.3 11.3* 11.1*  CREATININE 1.24* 1.27* 1.18* 1.26* 1.27*  GFRNONAA  39* 38*  --  38* 38*  GFRAA 45* 44*  --  44* 44*    LIVER FUNCTION TESTS:  Recent Labs  07/12/14 0357 08/28/14 1944 08/29/14 0407 11/04/14 2035  BILITOT 0.7 1.0 1.2 0.6  AST 18 17 15 31   ALT 18 28 24  38*  ALKPHOS 59 67 62 67  PROT 7.1 8.1 7.7 9.1*  ALBUMIN 3.0* 4.4 3.9 4.6    TUMOR MARKERS: No results for input(s): AFPTM, CEA, CA199, CHROMGRNA in the last 8760 hours.  Assessment and Plan:  Ms. Prell is an 81 year old female presenting with Critical Limb Ischemia with a new diabetic foot ulcer of the left lower extremity, at the base of the great toe/metatarsal head of 2 months duration, getting worse with excellent wound care. She has undergone a previous course of antibiotics for infection. The etiology is likely multifactorial, related to both diabetic neuropathy and vascular insufficiency, as we have completed noninvasive imaging demonstrating evidence of peripheral vascular disease.  My impression is that Ms. Northrup is a candidate for revascularization of the left lower extremity given this ulcer and her cardiovascular risk factors. The new multidisciplinary guidelines published 2016 by vascular surgery and college of podiatry are very clear about the indication for revascularization in the setting of a diabetic foot ulcer, and I agree that improving the blood flow is indicated.  I had a full informed consent with the patient and the patient's son, including the natural history of vascular disease, pathophysiology, potential treatment algorithms including revascularization. I discussed the risk benefit analysis, with risks including bleeding, infection, kidney injury, contrast reaction, arterial injury/dissection, further surgery/intervention, tissue loss/amputation, cardiopulmonary collapse, death. I also shared the known information about high risk of further tissue loss and amputation if revascularization is not accomplished.  I would suggest completing a CT angiogram with runoff  of the lower extremity for evaluating potential targets for revascularization, as she may have inflow problems in the pelvis. We will plan on pre-medicating before the contrast bolus given her questionable history of a prior reaction. Further details about such a reaction could not be provided by the patient or the patient's son at this time.  After the CT angiogram is performed, we will plan on revascularization with endovascular approach.  Thank you for this interesting consult.  I greatly enjoyed meeting Theresa Mclean and look forward to participating in her care.  SignedCorrie Mckusick 01/08/2015, 12:44 PM   I spent a total of  45 Minutes   in face to face in clinical consultation, greater than 50% of which was counseling/coordinating care for peripheral vascular disease, critical limb ischemia, diabetic foot ulcer, indications for endovascular revascularization , and angioplasty/angiogram. Signed,  Dulcy Fanny. Earleen Newport, DO

## 2015-01-12 ENCOUNTER — Ambulatory Visit (INDEPENDENT_AMBULATORY_CARE_PROVIDER_SITE_OTHER): Payer: Medicare Other | Admitting: Neurology

## 2015-01-12 ENCOUNTER — Encounter: Payer: Self-pay | Admitting: Neurology

## 2015-01-12 VITALS — BP 124/76 | HR 80

## 2015-01-12 DIAGNOSIS — R1110 Vomiting, unspecified: Secondary | ICD-10-CM

## 2015-01-12 DIAGNOSIS — R251 Tremor, unspecified: Secondary | ICD-10-CM

## 2015-01-12 DIAGNOSIS — R9402 Abnormal brain scan: Secondary | ICD-10-CM

## 2015-01-12 DIAGNOSIS — F0280 Dementia in other diseases classified elsewhere without behavioral disturbance: Secondary | ICD-10-CM

## 2015-01-12 DIAGNOSIS — I25119 Atherosclerotic heart disease of native coronary artery with unspecified angina pectoris: Secondary | ICD-10-CM | POA: Diagnosis not present

## 2015-01-12 DIAGNOSIS — G309 Alzheimer's disease, unspecified: Secondary | ICD-10-CM | POA: Diagnosis not present

## 2015-01-12 MED ORDER — DONEPEZIL HCL 10 MG PO TABS: 10.0000 mg | ORAL_TABLET | Freq: Every day | ORAL | Status: DC

## 2015-01-12 NOTE — Progress Notes (Signed)
Subjective:   Theresa Mclean was seen in consultation in the movement disorder clinic at the request of [name],[name], MD.  The evaluation is for tremor.  This patient is accompanied in the office by her child (son) who supplements the history.   The patient is a 81 y.o. right handed female with a history of tremor.  Pt states that it started in the last 1-2 months.  It seems to come and goes.  She seems to have it some days and not others.  It is most noticeable when the hands are not in use (at rest).  The L is most obvious and she doesn't notice the right much.  She can try and hold the arm and stop it for a little while. Her hydralazine was increased in November but she isn't sure if that is related.  Tremor: Yes.   Voice: some softer Sleep:   Vivid Dreams:  No.  Acting out dreams:  No. Wet Pillows: No. Postural symptoms:  Yes.    Falls?  No. (states that uses WC because of "bad knees" and doesn't walk now) Bradykinesia symptoms: slow movements, difficulty getting out of a chair and difficulty regaining balance Loss of smell:  Yes.   Loss of taste:  No. Urinary Incontinence:  No. Difficulty Swallowing:  No. (was troublesome but better now) Handwriting, micrographia: had accident in 1991 and hurt hand and hasn't written well since Trouble with ADL's:  Yes.  (needs assist)  Trouble buttoning clothing: Yes.   Depression:  No Memory changes:  Yes.   Hallucinations:  No.  visual distortions: rare N/V:  No. Lightheaded:  Yes.    Syncope: No. Diplopia:  Yes.   (told due to cataract)  09/17/14 update:  The patient is following up sooner than expected, accompanied by her son who supplements the history.  Patient has history of parkinsonian tremor, but no evidence of Parkinson's disease.  Because of horizontal nystagmus, I did order an MRI of the brain.  There was evidence of mild to moderate atrophy and mild to moderate small vessel disease.  I reviewed this with the patient and her  family today.  The patient's son stated that he wanted to talk to me today about memory changes.  The patient admits to memory changes, although she cannot give me specific examples.  The patient's son stated that she forgot her Social Security number on one occasion, which is very unusual.  In addition, she'll sometimes confuse her sons.  Recently, she confused a $1 bill for a $20 bill.  01/12/15 update:  The patient is following up today, accompanied by her son [name]), daughter and a caregiver, who supplement the history.  Records were reviewed since last visit as well.  The patient has a history of parkinsonian tremor, without other features of Parkinson's disease.  According to her son, her tremor has not worsened.  It tends to come and go.  Last visit, we talked extensively about dementia but she had refused any medication for that.  Today, however, she does return on Aricept, 5 mg daily.  Her daughter states that it was just started last Thursday by her primary care physician.  She states that the primary care physician was concerned and was wondering if we could "the appropriate memory testing and if mini strokes were ruled out."  The patient has tolerated Aricept well since last week.  She was having some hallucinations, both daytime and nighttime and having difficulty sleeping.  She was placed  on Ambien last week and the sleep got better and the hallucinations also got better.  She is not walking at all and even for transfers, her son has to lift her onto the bed or onto the toilet seat.  The patient does have an appointment with [facility] gastroenterology next week, as she has been throwing up after she eats.  The patient was admitted to the hospital from March 8 to 11/05/2014 because of constipation.  Records also reveal that she has critical limb ischemia of the left foot and a revascularization procedure is being considered.  A CTA of the lower extremities is pending.    Outside reports reviewed:  historical medical records, radiology reports and referral letter/letters.  Allergies  Allergen Reactions  . Ivp Dye [Iodinated Diagnostic Agents] Anaphylaxis    Outpatient Encounter Prescriptions as of 01/12/2015  Medication Sig  . acitretin (SORIATANE) 10 MG capsule Take 10 mg by mouth daily with supper.  Marland Kitchen amLODipine (NORVASC) 10 MG tablet Take 10 mg by mouth every morning.   Marland Kitchen aspirin 81 MG tablet Take 81 mg by mouth daily.  Marland Kitchen atorvastatin (LIPITOR) 20 MG tablet Take 20 mg by mouth every evening.   . benzonatate (TESSALON) 100 MG capsule Take 100 mg by mouth 3 (three) times daily as needed for cough.  . chlorthalidone (HYGROTON) 25 MG tablet Take 1 tablet (25 mg total) by mouth daily. (Patient taking differently: Take 25 mg by mouth every morning. )  . clobetasol (OLUX) 0.05 % topical foam Apply to scalp daily as needed  . donepezil (ARICEPT) 5 MG tablet Take 5 mg by mouth at bedtime.  . feeding supplement, GLUCERNA SHAKE, (GLUCERNA SHAKE) LIQD Take 237 mLs by mouth daily after supper.  . fexofenadine (ALLEGRA) 180 MG tablet Take 180 mg by mouth every evening.  . fluticasone (FLONASE) 50 MCG/ACT nasal spray Place 2 sprays into both nostrils daily as needed for allergies or rhinitis.  . folic acid (FOLVITE) 1 MG tablet Take 1 tablet (1 mg total) by mouth daily.  . furosemide (LASIX) 40 MG tablet Take 80-120 mg by mouth every morning.  . gabapentin (NEURONTIN) 100 MG capsule Take 2 capsules (200 mg total) by mouth 2 (two) times daily.  . hydrALAZINE (APRESOLINE) 100 MG tablet TAKE 1 TABLET BY MOUTH THREE TIMES DAILY  . HYDROcodone-acetaminophen (NORCO) 7.5-325 MG per tablet   . insulin aspart (NOVOLOG) 100 UNIT/ML injection SLIDING SCALE 0-9 Units, Injected subcutaneous, 3 times daily with meals. CBG < 70: implement hypoglycemia protocol CBG 70 - 120: 0 units CBG 121 - 150: 1 unit CBG 151 - 200: 2 units CBG 201 - 250: 3 units CBG 251 - 300: 5 units CBG 301 - 350: 7 units CBG 351 -  400: 9 units CBG > 400: call MD (Patient taking differently: Inject 0-9 Units into the skin 3 (three) times daily with meals. SLIDING SCALE 0-9 Units, Injected subcutaneous, 3 times daily with meals. CBG < 70: implement hypoglycemia protocol CBG 70 - 120: 0 units CBG 121 - 150: 1 unit CBG 151 - 200: 2 units CBG 201 - 250: 3 units CBG 251 - 300: 5 units CBG 301 - 350: 7 units CBG 351 - 400: 9 units CBG > 400: call MD)  . insulin glargine (LANTUS) 100 UNIT/ML injection Inject 32 Units into the skin 2 (two) times daily.   . montelukast (SINGULAIR) 10 MG tablet Take 10 mg by mouth every morning.   . nitroGLYCERIN (NITROSTAT) 0.4 MG SL tablet Place  0.4 mg under the tongue every 5 (five) minutes as needed for chest pain.  . pantoprazole (PROTONIX) 40 MG tablet Take 40 mg by mouth every morning.   . polyethylene glycol (MIRALAX / GLYCOLAX) packet Take 17 g by mouth daily.  . ranolazine (RANEXA) 500 MG 12 hr tablet Take 1 tablet (500 mg total) by mouth 2 (two) times daily.  . silver sulfADIAZINE (SILVADENE) 1 % cream Apply 1 application topically daily.  Marland Kitchen spironolactone (ALDACTONE) 25 MG tablet Take 1 tablet (25 mg total) by mouth daily.  Marland Kitchen triamcinolone ointment (KENALOG) 0.1 % Apply 1 application topically 2 (two) times daily as needed (FOR ITCHING/IRRITATION). APPLY TO AFFECTED AREA TWICE DAILY AS NEEDED  . zolpidem (AMBIEN) 5 MG tablet Take 5 mg by mouth at bedtime as needed for sleep.  . [DISCONTINUED] docusate sodium (COLACE) 100 MG capsule Take 1 capsule (100 mg total) by mouth 2 (two) times daily.  . [DISCONTINUED] ergocalciferol (VITAMIN D2) 50000 UNITS capsule Take 50,000 Units by mouth.  . [DISCONTINUED] RANEXA 500 MG 12 hr tablet TAKE 1 TABLET BY MOUTH TWICE DAILY   No facility-administered encounter medications on file as of 01/12/2015.    Past Medical History  Diagnosis Date  . Hypertension   . Nephrolithiasis   . GERD (gastroesophageal reflux disease)   . Arthritis   .  Psoriasis   . Morbid obesity   . Mitral regurgitation   . Hyperlipidemia   . Sleep apnea     uses cpap  . CHF (congestive heart failure)     Diastolic. EF 55-65% by cath 10/11/11  . Diabetes mellitus     insulin dependent  . Acute renal insufficiency     09/2011. not on ACEI secondary to this (also has renal artery stenosis)  . Peripheral vascular disease     severe right renal artery stenosis and left SFA stenosis by PV angio 09/2011, treated medically  . Cellulitis   . CAD (coronary artery disease)     NSTEMI 09/2011 felt poor candidate for CABG, instead s/p PTCA/DES to mid RCA 10/17/11  . Chronic respiratory failure   . Pulmonary hypertension   . Obstructive sleep apnea   . CKD (chronic kidney disease) stage 2, GFR 60-89 ml/min 07/11/2013  . Vitamin D deficiency     Past Surgical History  Procedure Laterality Date  . Acromio-clavicular joint repair  2011  . Abdominal hysterectomy    . Eye surgery      cataract  OD  . Lower extremity angiogram N/A 10/11/2011    Procedure: LOWER EXTREMITY ANGIOGRAM;  Surgeon: Sherren Mocha, MD;  Location[facility] CATH LAB;  Service: Cardiovascular;  Laterality: N/A;  . Left and right heart catheterization with coronary angiogram N/A 10/11/2011    Procedure: LEFT AND RIGHT HEART CATHETERIZATION WITH CORONARY ANGIOGRAM;  Surgeon: Sherren Mocha, MD;  Location: [facility] CATH LAB;  Service: Cardiovascular;  Laterality: N/A;  . Percutaneous coronary stent intervention (pci-s) N/A 10/17/2011    Procedure: PERCUTANEOUS CORONARY STENT INTERVENTION (PCI-S);  Surgeon: Burnell Blanks, MD;  Location: [facility] CATH LAB;  Service: Cardiovascular;  Laterality: N/A;    History   Social History  . Marital Status: Divorced    Spouse Name: N/A  . Number of Children: N/A  . Years of Education: N/A   Occupational History  . Not on file.   Social History Main Topics  . Smoking status: Never Smoker   . Smokeless tobacco: Never Used  . Alcohol Use: No  . Drug Use: No    .  Sexual Activity: No   Other Topics Concern  . Not on file   Social History Narrative    Family Status  Relation Status Death Age  . Mother Deceased     Cancer  . Father Deceased     Cancer  . Brother Alive   . Brother Alive   . Brother Alive   . Brother Deceased     accident (horse)  . Sister Alive   . Sister Alive   . Sister Alive   . Sister Alive   . Sister Alive   . Sister Alive   . Sister Alive   . Sister Alive   . Sister Alive   . Sister Deceased     unknown  . Son Alive   . Son Alive   . Son Alive   . Daughter Alive     Review of Systems Limited today as pt felt very nauseated and had emesis in the room.  Did state that having pain in the L foot (being worked up).   Objective:   VITALS:   Filed Vitals:   01/12/15 1414  BP: 124/76  Pulse: 80   Gen:  Appears stated age and in NAD. CV:  RRR Lungs:  CTAB   NEUROLOGICAL:  Orientation:    Could not be assessed, as when I started to do this the patient reported that she felt nauseous and began to throw up.  Following this, exam was very limited. Montreal Cognitive Assessment  09/17/2014  Visuospatial/ Executive (0/5) 1  Naming (0/3) 1  Attention: Read list of digits (0/2) 0  Attention: Read list of letters (0/1) 0  Attention: Serial 7 subtraction starting at 100 (0/3) 0  Language: Repeat phrase (0/2) 2  Language : Fluency (0/1) 0  Abstraction (0/2) 0  Delayed Recall (0/5) 0  Orientation (0/6) 5  Total 9  Adjusted Score (based on education) 10   MOVEMENT EXAM: Tremor:  There is a rare tremor today, and only in the upper extremities.  Labs:  Lab Results  Component Value Date   TSH 3.435 08/28/2014     Chemistry      Component Value Date/Time   NA 133* 11/05/2014 0621   K 3.4* 11/05/2014 0621   CL 89* 11/05/2014 0621   CO2 32 11/05/2014 0621   BUN 64* 11/05/2014 0621   CREATININE 1.27* 11/05/2014 0621   CREATININE 1.18* 09/08/2014 1158      Component Value Date/Time   CALCIUM  11.1* 11/05/2014 0621   ALKPHOS 67 11/04/2014 2035   AST 31 11/04/2014 2035   ALT 38* 11/04/2014 2035   BILITOT 0.6 11/04/2014 2035     Lab Results  Component Value Date   WBC 13.8* 11/05/2014   HGB 13.0 11/05/2014   HCT 40.1 11/05/2014   MCV 80.8 11/05/2014   PLT 318 11/05/2014        Assessment/Plan:   1.  Parkinsonian tremor without other evidence of Parkinsons disease  -Long talk with the patient, her son, her daughter and her caregiver.  Her daughter had many questions as she had not been here previously.  She doesn't meet criteria for PD.  She does have a rest tremor when distracted.  i would not treat this with medication and pt completely agrees.  I would, however, like to watch her periodically to make sure that she doesn't develop PD and will schedule a f/u in 6 months  2.  MRI with cerebral small vessel disease and atrophy  -Multiple risk  factors for this including hypertension, hyperlipidemia, diabetes mellitus and peripheral vascular disease.  I reviewed this with her daughter today as she had not previously been here in 3.  Memory changes, likely Alzheimer's dementia.  -Long discussion with patient and her family.  Virtually 100% of this 25 min visit was spent in counseling (as the patient could not participate in much of the examination as she felt nauseated and began to have emesis).  She has only been on Aricept, 5 mg, for a few days and she will continue that.  Once her prescription runs out (she had a 30 day prescription) we will increase to 10 mg daily.  -I talked to the patient and her family about making sure that she is not sitting around all day, which it appears that she is currently.  I gave her daughter several activities that she could participate in.  -I do not care for the Ambien that she was just placed on, but clinically she seems to be doing a bit better on it.  My concern is that this medication really should not be given on a chronic basis, especially in  the dementia population.  Again, she is doing better on this and her primary care physician is managing it. 4.  Plan to see her back in 6 months.  In that time, she will have followed up with the gastroenterologist regarding the postprandial emesis (started before the initiation of Aricept) and will be following up with IR regarding possible intervention/revascularization of the left foot.  Her son states that she will not be put to sleep for this.

## 2015-01-12 NOTE — Progress Notes (Signed)
Note routed to Dr Woody Seller.

## 2015-01-14 LAB — CBC
HCT: 42.3 % (ref 36.0–46.0)
Hemoglobin: 13.6 g/dL (ref 12.0–15.0)
MCH: 25.9 pg — ABNORMAL LOW (ref 26.0–34.0)
MCHC: 32.2 g/dL (ref 30.0–36.0)
MCV: 80.6 fL (ref 78.0–100.0)
MPV: 10.2 fL (ref 8.6–12.4)
PLATELETS: 206 10*3/uL (ref 150–400)
RBC: 5.25 MIL/uL — ABNORMAL HIGH (ref 3.87–5.11)
RDW: 18.5 % — ABNORMAL HIGH (ref 11.5–15.5)
WBC: 10.5 10*3/uL (ref 4.0–10.5)

## 2015-01-14 LAB — BASIC METABOLIC PANEL
BUN: 32 mg/dL — AB (ref 6–23)
CHLORIDE: 96 meq/L (ref 96–112)
CO2: 19 mEq/L (ref 19–32)
Calcium: 10.6 mg/dL — ABNORMAL HIGH (ref 8.4–10.5)
Creat: 1.37 mg/dL — ABNORMAL HIGH (ref 0.50–1.10)
GLUCOSE: 173 mg/dL — AB (ref 70–99)
Potassium: 5.1 mEq/L (ref 3.5–5.3)
Sodium: 132 mEq/L — ABNORMAL LOW (ref 135–145)

## 2015-01-14 LAB — PROTIME-INR
INR: 1.05 (ref <1.50)
PROTHROMBIN TIME: 13.7 s (ref 11.6–15.2)

## 2015-01-20 ENCOUNTER — Ambulatory Visit (HOSPITAL_COMMUNITY): Admission: RE | Admit: 2015-01-20 | Payer: Medicare Other | Source: Ambulatory Visit

## 2015-01-27 ENCOUNTER — Other Ambulatory Visit (HOSPITAL_COMMUNITY): Payer: Self-pay | Admitting: Diagnostic Radiology

## 2015-01-27 ENCOUNTER — Ambulatory Visit (HOSPITAL_COMMUNITY): Admission: RE | Admit: 2015-01-27 | Payer: Self-pay | Source: Ambulatory Visit

## 2015-01-27 ENCOUNTER — Other Ambulatory Visit: Payer: Self-pay | Admitting: Interventional Radiology

## 2015-01-27 ENCOUNTER — Other Ambulatory Visit (HOSPITAL_COMMUNITY): Payer: Medicare Other

## 2015-01-27 ENCOUNTER — Ambulatory Visit (HOSPITAL_COMMUNITY)
Admission: RE | Admit: 2015-01-27 | Discharge: 2015-01-27 | Disposition: A | Discharge status: Home / Self Care | Payer: Medicare Other | Source: Ambulatory Visit | Attending: Interventional Radiology | Admitting: Interventional Radiology

## 2015-01-27 DIAGNOSIS — I739 Peripheral vascular disease, unspecified: Secondary | ICD-10-CM | POA: Diagnosis present

## 2015-01-27 DIAGNOSIS — Q632 Ectopic kidney: Secondary | ICD-10-CM | POA: Diagnosis not present

## 2015-01-27 DIAGNOSIS — I701 Atherosclerosis of renal artery: Secondary | ICD-10-CM | POA: Diagnosis not present

## 2015-01-27 DIAGNOSIS — I70203 Unspecified atherosclerosis of native arteries of extremities, bilateral legs: Secondary | ICD-10-CM | POA: Diagnosis not present

## 2015-01-27 DIAGNOSIS — M170 Bilateral primary osteoarthritis of knee: Secondary | ICD-10-CM | POA: Insufficient documentation

## 2015-01-27 DIAGNOSIS — D7389 Other diseases of spleen: Secondary | ICD-10-CM | POA: Diagnosis not present

## 2015-01-27 MED ORDER — LIDOCAINE HCL 1 % IJ SOLN
INTRAMUSCULAR | Status: AC
  Filled 2015-01-27: qty 20

## 2015-01-27 MED ORDER — IOHEXOL 350 MG/ML SOLN
100.0000 mL | Freq: Once | INTRAVENOUS | Status: AC | PRN
  Administered 2015-01-27: 100 mL via INTRAVENOUS

## 2015-01-28 ENCOUNTER — Other Ambulatory Visit: Payer: Self-pay | Admitting: Interventional Radiology

## 2015-01-28 DIAGNOSIS — I739 Peripheral vascular disease, unspecified: Secondary | ICD-10-CM

## 2015-02-05 ENCOUNTER — Other Ambulatory Visit (HOSPITAL_COMMUNITY): Payer: Self-pay | Admitting: Interventional Radiology

## 2015-02-05 DIAGNOSIS — E13621 Other specified diabetes mellitus with foot ulcer: Secondary | ICD-10-CM

## 2015-02-05 DIAGNOSIS — L97529 Non-pressure chronic ulcer of other part of left foot with unspecified severity: Principal | ICD-10-CM

## 2015-02-13 ENCOUNTER — Other Ambulatory Visit: Payer: Self-pay | Admitting: Radiology

## 2015-02-16 ENCOUNTER — Other Ambulatory Visit (HOSPITAL_COMMUNITY): Payer: Medicare Other | Admitting: Interventional Radiology

## 2015-02-16 ENCOUNTER — Other Ambulatory Visit (HOSPITAL_COMMUNITY): Payer: Self-pay | Admitting: Interventional Radiology

## 2015-02-16 ENCOUNTER — Encounter (HOSPITAL_COMMUNITY): Payer: Self-pay

## 2015-02-16 ENCOUNTER — Ambulatory Visit (HOSPITAL_COMMUNITY)
Admission: RE | Admit: 2015-02-16 | Discharge: 2015-02-16 | Disposition: A | Discharge status: Home / Self Care | Payer: Medicare Other | Source: Ambulatory Visit | Attending: Interventional Radiology | Admitting: Interventional Radiology

## 2015-02-16 ENCOUNTER — Ambulatory Visit (HOSPITAL_BASED_OUTPATIENT_CLINIC_OR_DEPARTMENT_OTHER)
Admission: RE | Admit: 2015-02-16 | Discharge: 2015-02-16 | Disposition: A | Discharge status: Home / Self Care | Payer: Medicare Other | Source: Ambulatory Visit | Attending: Radiology | Admitting: Radiology

## 2015-02-16 DIAGNOSIS — I5032 Chronic diastolic (congestive) heart failure: Secondary | ICD-10-CM | POA: Insufficient documentation

## 2015-02-16 DIAGNOSIS — M170 Bilateral primary osteoarthritis of knee: Secondary | ICD-10-CM | POA: Diagnosis not present

## 2015-02-16 DIAGNOSIS — E559 Vitamin D deficiency, unspecified: Secondary | ICD-10-CM | POA: Insufficient documentation

## 2015-02-16 DIAGNOSIS — L97529 Non-pressure chronic ulcer of other part of left foot with unspecified severity: Secondary | ICD-10-CM | POA: Diagnosis not present

## 2015-02-16 DIAGNOSIS — I701 Atherosclerosis of renal artery: Secondary | ICD-10-CM | POA: Diagnosis not present

## 2015-02-16 DIAGNOSIS — I252 Old myocardial infarction: Secondary | ICD-10-CM | POA: Insufficient documentation

## 2015-02-16 DIAGNOSIS — N182 Chronic kidney disease, stage 2 (mild): Secondary | ICD-10-CM | POA: Diagnosis not present

## 2015-02-16 DIAGNOSIS — K219 Gastro-esophageal reflux disease without esophagitis: Secondary | ICD-10-CM | POA: Diagnosis not present

## 2015-02-16 DIAGNOSIS — Z87442 Personal history of urinary calculi: Secondary | ICD-10-CM | POA: Diagnosis not present

## 2015-02-16 DIAGNOSIS — M5136 Other intervertebral disc degeneration, lumbar region: Secondary | ICD-10-CM | POA: Diagnosis not present

## 2015-02-16 DIAGNOSIS — Z7982 Long term (current) use of aspirin: Secondary | ICD-10-CM | POA: Insufficient documentation

## 2015-02-16 DIAGNOSIS — I70245 Atherosclerosis of native arteries of left leg with ulceration of other part of foot: Secondary | ICD-10-CM | POA: Diagnosis not present

## 2015-02-16 DIAGNOSIS — E785 Hyperlipidemia, unspecified: Secondary | ICD-10-CM | POA: Insufficient documentation

## 2015-02-16 DIAGNOSIS — S301XXA Contusion of abdominal wall, initial encounter: Secondary | ICD-10-CM

## 2015-02-16 DIAGNOSIS — Z6833 Body mass index (BMI) 33.0-33.9, adult: Secondary | ICD-10-CM | POA: Insufficient documentation

## 2015-02-16 DIAGNOSIS — G4733 Obstructive sleep apnea (adult) (pediatric): Secondary | ICD-10-CM | POA: Diagnosis not present

## 2015-02-16 DIAGNOSIS — I129 Hypertensive chronic kidney disease with stage 1 through stage 4 chronic kidney disease, or unspecified chronic kidney disease: Secondary | ICD-10-CM | POA: Diagnosis not present

## 2015-02-16 DIAGNOSIS — Z955 Presence of coronary angioplasty implant and graft: Secondary | ICD-10-CM | POA: Diagnosis not present

## 2015-02-16 DIAGNOSIS — E11621 Type 2 diabetes mellitus with foot ulcer: Secondary | ICD-10-CM | POA: Insufficient documentation

## 2015-02-16 DIAGNOSIS — E1122 Type 2 diabetes mellitus with diabetic chronic kidney disease: Secondary | ICD-10-CM | POA: Insufficient documentation

## 2015-02-16 DIAGNOSIS — I272 Other secondary pulmonary hypertension: Secondary | ICD-10-CM | POA: Diagnosis not present

## 2015-02-16 DIAGNOSIS — E13621 Other specified diabetes mellitus with foot ulcer: Secondary | ICD-10-CM | POA: Insufficient documentation

## 2015-02-16 DIAGNOSIS — Z794 Long term (current) use of insulin: Secondary | ICD-10-CM | POA: Diagnosis not present

## 2015-02-16 DIAGNOSIS — I2510 Atherosclerotic heart disease of native coronary artery without angina pectoris: Secondary | ICD-10-CM | POA: Insufficient documentation

## 2015-02-16 DIAGNOSIS — I739 Peripheral vascular disease, unspecified: Secondary | ICD-10-CM | POA: Diagnosis present

## 2015-02-16 DIAGNOSIS — I340 Nonrheumatic mitral (valve) insufficiency: Secondary | ICD-10-CM | POA: Diagnosis not present

## 2015-02-16 DIAGNOSIS — Z91041 Radiographic dye allergy status: Secondary | ICD-10-CM | POA: Diagnosis not present

## 2015-02-16 LAB — CBC
HEMATOCRIT: 37.7 % (ref 36.0–46.0)
HEMOGLOBIN: 12.6 g/dL (ref 12.0–15.0)
MCH: 26 pg (ref 26.0–34.0)
MCHC: 33.4 g/dL (ref 30.0–36.0)
MCV: 77.9 fL — AB (ref 78.0–100.0)
Platelets: 272 10*3/uL (ref 150–400)
RBC: 4.84 MIL/uL (ref 3.87–5.11)
RDW: 18.6 % — AB (ref 11.5–15.5)
WBC: 8.9 10*3/uL (ref 4.0–10.5)

## 2015-02-16 LAB — CBC WITH DIFFERENTIAL/PLATELET
Basophils Absolute: 0 10*3/uL (ref 0.0–0.1)
Basophils Relative: 0 % (ref 0–1)
EOS ABS: 0 10*3/uL (ref 0.0–0.7)
EOS PCT: 0 % (ref 0–5)
HEMATOCRIT: 39.6 % (ref 36.0–46.0)
Hemoglobin: 13.5 g/dL (ref 12.0–15.0)
LYMPHS PCT: 17 % (ref 12–46)
Lymphs Abs: 1 10*3/uL (ref 0.7–4.0)
MCH: 26.3 pg (ref 26.0–34.0)
MCHC: 34.1 g/dL (ref 30.0–36.0)
MCV: 77.2 fL — ABNORMAL LOW (ref 78.0–100.0)
MONO ABS: 0.1 10*3/uL (ref 0.1–1.0)
Monocytes Relative: 2 % — ABNORMAL LOW (ref 3–12)
Neutro Abs: 4.9 10*3/uL (ref 1.7–7.7)
Neutrophils Relative %: 81 % — ABNORMAL HIGH (ref 43–77)
PLATELETS: 266 10*3/uL (ref 150–400)
RBC: 5.13 MIL/uL — ABNORMAL HIGH (ref 3.87–5.11)
RDW: 18.5 % — ABNORMAL HIGH (ref 11.5–15.5)
WBC: 6 10*3/uL (ref 4.0–10.5)

## 2015-02-16 LAB — BASIC METABOLIC PANEL
Anion gap: 10 (ref 5–15)
BUN: <5 mg/dL — ABNORMAL LOW (ref 6–20)
CALCIUM: 9.3 mg/dL (ref 8.9–10.3)
CO2: 27 mmol/L (ref 22–32)
CREATININE: 0.8 mg/dL (ref 0.44–1.00)
Chloride: 97 mmol/L — ABNORMAL LOW (ref 101–111)
GFR calc Af Amer: >60 mL/min (ref >60)
GFR calc non Af Amer: >60 mL/min (ref >60)
Glucose, Bld: 125 mg/dL — ABNORMAL HIGH (ref 65–99)
Potassium: 2.7 mmol/L — CL (ref 3.5–5.1)
SODIUM: 134 mmol/L — AB (ref 135–145)

## 2015-02-16 LAB — GLUCOSE, CAPILLARY
Glucose-Capillary: 133 mg/dL — ABNORMAL HIGH (ref 65–99)
Glucose-Capillary: 105 mg/dL — ABNORMAL HIGH (ref 65–99)
Glucose-Capillary: 109 mg/dL — ABNORMAL HIGH (ref 65–99)
Glucose-Capillary: 132 mg/dL — ABNORMAL HIGH (ref 65–99)

## 2015-02-16 LAB — POCT ACTIVATED CLOTTING TIME: Activated Clotting Time: 239 seconds

## 2015-02-16 LAB — PROTIME-INR
INR: 1.08 (ref 0.00–1.49)
PROTHROMBIN TIME: 14.2 s (ref 11.6–15.2)

## 2015-02-16 LAB — APTT: aPTT: 24 seconds (ref 24–37)

## 2015-02-16 MED ORDER — HEPARIN SODIUM (PORCINE) 1000 UNIT/ML IJ SOLN
INTRAMUSCULAR | Status: AC
  Filled 2015-02-16: qty 1

## 2015-02-16 MED ORDER — MIDAZOLAM HCL 2 MG/2ML IJ SOLN
INTRAMUSCULAR | Status: AC | PRN
  Administered 2015-02-16: 0.5 mg via INTRAVENOUS

## 2015-02-16 MED ORDER — FENTANYL CITRATE (PF) 100 MCG/2ML IJ SOLN
INTRAMUSCULAR | Status: AC
  Filled 2015-02-16: qty 4

## 2015-02-16 MED ORDER — IODIXANOL 320 MG/ML IV SOLN
200.0000 mL | Freq: Once | INTRAVENOUS | Status: AC | PRN
  Administered 2015-02-16: 100 mL via INTRA_ARTERIAL

## 2015-02-16 MED ORDER — SODIUM CHLORIDE 0.9 % IV SOLN
INTRAVENOUS | Status: AC | PRN
  Administered 2015-02-16: 10 mL/h via INTRAVENOUS

## 2015-02-16 MED ORDER — SODIUM CHLORIDE 0.9 % IV SOLN: INTRAVENOUS | Status: AC

## 2015-02-16 MED ORDER — FENTANYL CITRATE (PF) 100 MCG/2ML IJ SOLN
INTRAMUSCULAR | Status: AC
  Filled 2015-02-16: qty 2

## 2015-02-16 MED ORDER — FENTANYL CITRATE (PF) 100 MCG/2ML IJ SOLN
INTRAMUSCULAR | Status: AC | PRN
  Administered 2015-02-16 (×5): 25 ug via INTRAVENOUS

## 2015-02-16 MED ORDER — MIDAZOLAM HCL 2 MG/2ML IJ SOLN
INTRAMUSCULAR | Status: AC
  Filled 2015-02-16: qty 4

## 2015-02-16 MED ORDER — LIDOCAINE HCL 1 % IJ SOLN
INTRAMUSCULAR | Status: AC
  Filled 2015-02-16: qty 20

## 2015-02-16 MED ORDER — POTASSIUM CHLORIDE 10 MEQ/100ML IV SOLN
10.0000 meq | Freq: Once | INTRAVENOUS | Status: AC
  Administered 2015-02-16: 10 meq via INTRAVENOUS
  Filled 2015-02-16: qty 100

## 2015-02-16 MED ORDER — IOHEXOL 300 MG/ML  SOLN: 200.0000 mL | Freq: Once | INTRAMUSCULAR | Status: DC | PRN

## 2015-02-16 MED ORDER — HEPARIN SODIUM (PORCINE) 1000 UNIT/ML IJ SOLN
INTRAMUSCULAR | Status: AC | PRN
  Administered 2015-02-16: 8000 [IU] via INTRAVENOUS

## 2015-02-16 MED ORDER — SODIUM CHLORIDE 0.9 % IV SOLN: INTRAVENOUS | Status: DC

## 2015-02-16 NOTE — Sedation Documentation (Signed)
Moved to bed. Pressure off groin and patient is comfortable now

## 2015-02-16 NOTE — Sedation Documentation (Signed)
Sheath out. Exoseal deployed but not effective therefore pressure held by Dr Earleen Newport.

## 2015-02-16 NOTE — Sedation Documentation (Addendum)
Started to rebleed, and pressure was held for 9 more minutes. Now dressing clean dry intact, level 0. Pedal pulses DP and PT bilateral heard by doppler

## 2015-02-16 NOTE — Progress Notes (Signed)
Patient discharged to home in stable condition.  Dr. Earleen Newport came to bedside to see patient and family before discharge.  Hematoma to right groin has not changed since Dr Earleen Newport first came to bedside earlier in the shift and is about a 2 by 5 inch area just below entry area.  Discharge teaching done with family and medication instruction done by Dr. Earleen Newport.

## 2015-02-16 NOTE — Discharge Instructions (Signed)
Angiogram, Care After Refer to this sheet in the next few weeks. These instructions provide you with information on caring for yourself after your procedure. Your health care provider may also give you more specific instructions. Your treatment has been planned according to current medical practices, but problems sometimes occur. Call your health care provider if you have any problems or questions after your procedure.  WHAT TO EXPECT AFTER THE PROCEDURE After your procedure, it is typical to have the following sensations:  Minor discomfort or tenderness and a small bump at the catheter insertion site. The bump should usually decrease in size and tenderness within 1 to 2 weeks.  Any bruising will usually fade within 2 to 4 weeks. HOME CARE INSTRUCTIONS   You may need to keep taking blood thinners if they were prescribed for you. Take medicines only as directed by your health care provider.  Do not apply powder or lotion to the site.  Do not take baths, swim, or use a hot tub until your health care provider approves.  You may shower 24 hours after the procedure. Remove the bandage (dressing) and gently wash the site with plain soap and water. Gently pat the site dry.  Inspect the site at least twice daily.  Limit your activity for the first 48 hours. Do not bend, squat, or lift anything over 20 lb (9 kg) or as directed by your health care provider.  Plan to have someone take you home after the procedure. Follow instructions about when you can drive or return to work. SEEK MEDICAL CARE IF:  You get light-headed when standing up.  You have drainage (other than a small amount of blood on the dressing).  You have chills.  You have a fever.  You have redness, warmth, swelling, or pain at the insertion site. SEEK IMMEDIATE MEDICAL CARE IF:   You develop chest pain or shortness of breath, feel faint, or pass out.  You have bleeding, swelling larger than a walnut, or drainage from the  catheter insertion site.  You develop pain, discoloration, coldness, or severe bruising in the leg or arm that held the catheter.  You develop bleeding from any other place, such as the bowels. You may see bright red blood in your urine or stools, or your stools may appear black and tarry.  You have heavy bleeding from the site. If this happens, hold pressure on the site. MAKE SURE YOU:  Understand these instructions.  Will watch your condition.  Will get help right away if you are not doing well or get worse. Document Released: 03/03/2005 Document Revised: 12/30/2013 Document Reviewed: 01/07/2013 [facility] Patient Information 2015 [place], [place]. This information is not intended to replace advice given to you by your health care provider. Make sure you discuss any questions you have with your health care provider. Angiogram An angiogram, also called angiography, is a procedure used to look at the blood vessels that carry blood to different parts of your body (arteries). In this procedure, dye is injected through a long, thin tube (catheter) into an artery. X-rays are then taken. The X-rays will show if there is a blockage or problem in a blood vessel.  LET [facility] CARE PROVIDER KNOW ABOUT:  Any allergies you have, including allergies to shellfish or contrast dye.   All medicines you are taking, including vitamins, herbs, eye drops, creams, and over-the-counter medicines.   Previous problems you or members of your family have had with the use of anesthetics.   Any  blood disorders you have.   Previous surgeries you have had.  Any previous kidney problems or failure you have had.  Medical conditions you have.   Possibility of pregnancy, if this applies. RISKS AND COMPLICATIONS Generally, an angiogram is a safe procedure. However, as with any procedure, problems can occur. Possible problems include:  Injury to the blood vessels, including rupture or bleeding.  Infection  or bruising at the catheter site.  Allergic reaction to the dye or contrast used.  Kidney damage from the dye or contrast used.  Blood clots that can lead to a stroke or heart attack. BEFORE THE PROCEDURE  Do not eat or drink after midnight on the night before the procedure, or as directed by your health care provider.   Ask your health care provider if you may drink enough water to take any needed medicines the morning of the procedure.  PROCEDURE  You may be given a medicine to help you relax (sedative) before and during the procedure. This medicine is given through an IV access tube that is inserted into one of your veins.   The area where the catheter will be inserted will be washed and shaved. This is usually done in the groin but may be done in the fold of your arm (near your elbow) or in the wrist.  A medicine will be given to numb the area where the catheter will be inserted (local anesthetic).  The catheter will be inserted with a guide wire into an artery. The catheter is guided by using a type of X-ray (fluoroscopy) to the blood vessel being examined.   Dye is then injected into the catheter, and X-rays are taken. The dye helps to show where any narrowing or blockages are located.  AFTER THE PROCEDURE   If the procedure is done through the leg, you will be kept in bed lying flat for several hours. You will be instructed to not bend or cross your legs.  The insertion site will be checked frequently.  The pulse in your feet or wrist will be checked frequently.  Additional blood tests, X-rays, and electrocardiography may be done.   You may need to stay in the hospital overnight for observation.  Document Released: 05/25/2005 Document Revised: 08/20/2013 Document Reviewed: 01/16/2013 [facility] Patient Information 2015 [place], [place]. This information is not intended to replace advice given to you by your health care provider. Make sure you discuss any questions you  have with your health care provider.

## 2015-02-16 NOTE — Progress Notes (Signed)
VASCULAR LAB PRELIMINARY  PRELIMINARY  PRELIMINARY  PRELIMINARY  Right groin ultrasound completed.    Preliminary report:  No evidence of pseudoaneurysm or AV fistula.  Diffuse hematoma noted.  Brayley Mackowiak, RVT 02/16/2015, 5:04 PM

## 2015-02-16 NOTE — Progress Notes (Signed)
Potassium level of 2.7 received from lab, reported to Jannifer Franklin, PA. No new orders received.

## 2015-02-16 NOTE — H&P (Signed)
Chief Complaint:  L foot wound Slow to no healing  Referring Physician(s): Dr Abbey Chatters  History of Present Illness: Theresa Mclean is a 81 y.o. female   Hx Peripheral vascular disease Hx Diabetes New Left foot ulcer - slow to no healing x 2 mo despite antibx and wound care Evaluation and consult with Dr Earleen Newport IMPRESSION: VASCULAR  1. Solitary focal high-grade stenosis of the right distal SFA. Intact three-vessel runoff to the right ankle. 2. On the left, there are at least 3 focal moderate to high-grade stenoses involving the mid and distal SFA as well as a moderate focal stenosis of the above the knee popliteal artery. 3. Two vessel runoff to the left ankle. The anterior tibial artery is occluded in the upper calf. 4. Focal moderate to high-grade stenosis of the origin of the right renal artery. 5. Focal high-grade stenosis of the origin of the dominant branch of the left profunda femoral artery.  Scheduled now for LLE arteriogram with possible angioplasty/stent; athrectomy Pt has been premedicated with 13 hr prep for dye allergy                           Past Medical History  Diagnosis Date  . Hypertension   . Nephrolithiasis   . GERD (gastroesophageal reflux disease)   . Arthritis   . Psoriasis   . Morbid obesity   . Mitral regurgitation   . Hyperlipidemia   . Sleep apnea     uses cpap  . CHF (congestive heart failure)     Diastolic. EF 55-65% by cath 10/11/11  . Diabetes mellitus     insulin dependent  . Acute renal insufficiency     09/2011. not on ACEI secondary to this (also has renal artery stenosis)  . Peripheral vascular disease     severe right renal artery stenosis and left SFA stenosis by PV angio 09/2011, treated medically  . Cellulitis   . CAD (coronary artery disease)     NSTEMI 09/2011 felt poor candidate for CABG, instead s/p PTCA/DES to mid RCA 10/17/11  . Chronic respiratory failure   . Pulmonary hypertension   . Obstructive sleep  apnea   . CKD (chronic kidney disease) stage 2, GFR 60-89 ml/min 07/11/2013  . Vitamin D deficiency     Past Surgical History  Procedure Laterality Date  . Acromio-clavicular joint repair  2011  . Abdominal hysterectomy    . Eye surgery      cataract  OD  . Lower extremity angiogram N/A 10/11/2011    Procedure: LOWER EXTREMITY ANGIOGRAM;  Surgeon: Sherren Mocha, MD;  Location: [facility] CATH LAB;  Service: Cardiovascular;  Laterality: N/A;  . Left and right heart catheterization with coronary angiogram N/A 10/11/2011    Procedure: LEFT AND RIGHT HEART CATHETERIZATION WITH CORONARY ANGIOGRAM;  Surgeon: Sherren Mocha, MD;  Location: [facility] CATH LAB;  Service: Cardiovascular;  Laterality: N/A;  . Percutaneous coronary stent intervention (pci-s) N/A 10/17/2011    Procedure: PERCUTANEOUS CORONARY STENT INTERVENTION (PCI-S);  Surgeon: Burnell Blanks, MD;  Location: [facility] CATH LAB;  Service: Cardiovascular;  Laterality: N/A;    Allergies: Ivp dye and Rocephin  Medications: Prior to Admission medications   Medication Sig Start Date End Date Taking? Authorizing Provider  acitretin (SORIATANE) 10 MG capsule Take 10 mg by mouth daily with supper. 08/01/13  Yes Historical Provider, MD  albuterol (PROVENTIL HFA;VENTOLIN HFA) 108 (90 BASE) MCG/ACT inhaler Inhale 2 puffs into the  lungs every 6 (six) hours as needed for wheezing or shortness of breath.   Yes Historical Provider, MD  amLODipine (NORVASC) 10 MG tablet Take 10 mg by mouth daily.    Yes Historical Provider, MD  aspirin EC 81 MG tablet Take 81 mg by mouth daily.   Yes Historical Provider, MD  atorvastatin (LIPITOR) 20 MG tablet Take 20 mg by mouth at bedtime.    Yes Historical Provider, MD  benzonatate (TESSALON) 100 MG capsule Take 100 mg by mouth 3 (three) times daily as needed for cough.   Yes Historical Provider, MD  cefUROXime (CEFTIN) 500 MG tablet Take 500 mg by mouth 2 (two) times daily with a meal. 7 day course started 02/09/2015   Yes  Historical Provider, MD  chlorthalidone (HYGROTON) 25 MG tablet Take 1 tablet (25 mg total) by mouth daily. 09/05/14  Yes Herminio Commons, MD  clobetasol (OLUX) 0.05 % topical foam Apply 1 application topically daily as needed (psoriasis).  04/24/13  Yes Historical Provider, MD  donepezil (ARICEPT) 10 MG tablet Take 1 tablet (10 mg total) by mouth at bedtime. Patient taking differently: Take 10 mg by mouth daily.  01/12/15  Yes Rebecca S Tat, DO  fexofenadine (ALLEGRA) 180 MG tablet Take 180 mg by mouth at bedtime.    Yes Historical Provider, MD  folic acid (FOLVITE) 1 MG tablet Take 1 tablet (1 mg total) by mouth daily. 08/30/14  Yes Nita Sells, MD  furosemide (LASIX) 40 MG tablet Take 40-120 mg by mouth daily. Based on degree of swelling/fluid buildup   Yes Historical Provider, MD  gabapentin (NEURONTIN) 100 MG capsule Take 2 capsules (200 mg total) by mouth 2 (two) times daily. 07/14/14  Yes Geradine Girt, DO  hydrALAZINE (APRESOLINE) 100 MG tablet TAKE 1 TABLET BY MOUTH THREE TIMES DAILY 12/08/14  Yes Herminio Commons, MD  HYDROcodone-acetaminophen (NORCO) 7.5-325 MG per tablet Take 1 tablet by mouth every 6 (six) hours as needed (pain).  10/23/14  Yes Historical Provider, MD  insulin aspart (NOVOLOG FLEXPEN) 100 UNIT/ML FlexPen Inject 0-10 Units into the skin 3 (three) times daily with meals. Per sliding scale:  CBG 0-150 0 units, 151-200 2 units, 201-250 4 units, 251-300 6 nits, 301-350 8 units, 351-400 10 units, >400 call MD   Yes Historical Provider, MD  insulin glargine (LANTUS) 100 unit/mL SOPN Inject 60 Units into the skin 2 (two) times daily.   Yes Historical Provider, MD  iron polysaccharides (IFEREX 150) 150 MG capsule Take 150 mg by mouth daily.   Yes Historical Provider, MD  montelukast (SINGULAIR) 10 MG tablet Take 10 mg by mouth daily.    Yes Historical Provider, MD  nitroGLYCERIN (NITROSTAT) 0.4 MG SL tablet Place 0.4 mg under the tongue every 5 (five) minutes as needed for  chest pain.   Yes Historical Provider, MD  Nutritional Supplements (ENSURE PO) Take 237 mLs by mouth See admin instructions. Take once or twice daily as a meal supplement   Yes Historical Provider, MD  pantoprazole (PROTONIX) 40 MG tablet Take 40 mg by mouth daily.    Yes Historical Provider, MD  polyethylene glycol (MIRALAX / GLYCOLAX) packet Take 17 g by mouth daily. Patient taking differently: Take 17 g by mouth daily as needed (constipation).  11/05/14  Yes Estela Leonie Green, MD  ranolazine (RANEXA) 500 MG 12 hr tablet Take 1 tablet (500 mg total) by mouth 2 (two) times daily. 05/26/14  Yes Herminio Commons, MD  silver sulfADIAZINE (  SILVADENE) 1 % cream Apply 1 application topically daily.   Yes Historical Provider, MD  spironolactone (ALDACTONE) 25 MG tablet Take 1 tablet (25 mg total) by mouth daily. 08/16/13  Yes Kathie Dike, MD  zolpidem (AMBIEN) 5 MG tablet Take 5 mg by mouth at bedtime.    Yes Historical Provider, MD  feeding supplement, GLUCERNA SHAKE, (GLUCERNA SHAKE) LIQD Take 237 mLs by mouth daily after supper. Patient not taking: Reported on 02/13/2015 07/14/14   Geradine Girt, DO  insulin aspart (NOVOLOG) 100 UNIT/ML injection SLIDING SCALE 0-9 Units, Injected subcutaneous, 3 times daily with meals. CBG < 70: implement hypoglycemia protocol CBG 70 - 120: 0 units CBG 121 - 150: 1 unit CBG 151 - 200: 2 units CBG 201 - 250: 3 units CBG 251 - 300: 5 units CBG 301 - 350: 7 units CBG 351 - 400: 9 units CBG > 400: call MD Patient not taking: Reported on 02/13/2015 10/20/11   Dayna N Dunn, PA-C     Family History  Problem Relation Age of Onset  . Psoriasis Brother     History   Social History  . Marital Status: Divorced    Spouse Name: N/A  . Number of Children: N/A  . Years of Education: N/A   Social History Main Topics  . Smoking status: Never Smoker   . Smokeless tobacco: Never Used  . Alcohol Use: No  . Drug Use: No  . Sexual Activity: No   Other  Topics Concern  . None   Social History Narrative      Review of Systems: A 12 point ROS discussed and pertinent positives are indicated in the HPI above.  All other systems are negative.  Review of Systems  Constitutional: Positive for activity change. Negative for fever.  Respiratory: Negative for cough and shortness of breath.   Cardiovascular: Negative for chest pain.  Gastrointestinal: Negative for abdominal pain.  Neurological: Positive for weakness.  Psychiatric/Behavioral: Positive for confusion.    Vital Signs: BP 151/70 mmHg  Pulse 83  Temp(Src) 98.5 F (36.9 C)  Resp 20  Ht 5\' 3"  (1.6 m)  Wt 189 lb (85.73 kg)  BMI 33.49 kg/m2  SpO2 100%  LMP 08/29/1990  Physical Exam  Cardiovascular: Normal rate and regular rhythm.   No murmur heard. Pulmonary/Chest: Effort normal and breath sounds normal. She has no wheezes.  Abdominal: Soft. Bowel sounds are normal. There is no tenderness.  Musculoskeletal: Normal range of motion.  L foot ulcer  Neurological: She is alert.  Skin: Skin is warm and dry.  Psychiatric:  Confused Unable to answer yr of birth; place or time  Consented with Dtr at bedside  Nursing note and vitals reviewed.   Mallampati Score:  MD Evaluation Airway: WNL Heart: WNL Abdomen: WNL Chest/ Lungs: WNL ASA  Classification: 3 Mallampati/Airway Score: Two  Imaging: Ct Angio Ao+bifem W/cm &/or Wo/cm  01/27/2015   CLINICAL DATA:  81 year old female with peripheral arterial disease and leg pain.  EXAM: CT ANGIOGRAPHY OF ABDOMINAL AORTA WITH ILIOFEMORAL RUNOFF  TECHNIQUE: Multidetector CT imaging of the abdomen, pelvis and lower extremities was performed using the standard protocol during bolus administration of intravenous contrast. Multiplanar CT image reconstructions and MIPs were obtained to evaluate the vascular anatomy.  CONTRAST:  168mL OMNIPAQUE IOHEXOL 350 MG/ML SOLN  COMPARISON:  Noninvasive bilateral lower extremity ultrasound evaluation  12/22/2014 ; prior abdominal CT scan with contrast 07/10/2010  FINDINGS: VASCULAR  Aorta: Normal caliber aorta with mixed heterogeneous atherosclerotic plaque. No  evidence of dissection, focal stenosis or penetrating ulcer.  Celiac: Widely patent. Conventional hepatic arterial anatomy. No visceral artery aneurysm.  SMA: Widely patent.  Renals: Single dominant renal arteries bilaterally. Heterogeneous atherosclerotic plaque results in at least moderate stenosis of the proximal right renal artery. On the left, partially calcified heterogeneous atherosclerotic plaque results and mild narrowing of the renal artery origin. No changes of fibromuscular dysplasia.  IMA: Patent and unremarkable.  RIGHT Lower Extremity  Inflow: Heterogeneous atherosclerotic plaque throughout the common iliac artery without evidence of focal stenosis. The hypogastric artery is patent and unremarkable. The external iliac artery is relatively spared from disease.  Outflow: Mild calcified plaque along the posterior wall of the common femoral artery without significant stenosis. The origin of the profunda femoral artery is widely patent. Fibro fatty plaque results in a focal high-grade stenosis of the distal superficial femoral artery in the region of the adductor canal. The popliteal artery is widely patent.  Runoff: Three-vessel runoff to the ankle.  LEFT lower Extremity  Inflow: Atherosclerotic plaque without significant stenosis throughout the common iliac artery. Internal iliac artery is widely patent. External iliac arteries relatively spared from disease.  Outflow: Mild plaque along the posterior wall of the common femoral artery without evidence of significant stenosis. Medial and lateral origins of the profunda femoral artery. Heterogeneous plaque results in focal high-grade stenosis of the origin of the more medial and a dominant profunda femoral branch. Mild atherosclerotic plaque with multifocal predominantly mild stenoses throughout  the superficial femoral artery. However, in the mid thigh and there is a focal high-grade stenosis secondary to predominantly fibro fatty plaque. Distal to this focal stenosis there are an additional 2 tandem a focal moderate to high-grade stenoses in the region of the adductor canal. Additionally, there is a focal moderate stenosis of the above the knee popliteal artery.  Runoff: The anterior tibial artery occludes in the proximal calf. There is 2 vessel runoff to the ankle. The posterior tibial artery is the dominant runoff vessel.  Veins: No focal venous abnormality.  Review of the MIP images confirms the above findings.  NON-VASCULAR  Lower Chest: Dependent atelectasis in the lower lobes. Incompletely visualized cardiac structures are at the upper limits of normal for size. There appears to be concentric hypertrophy of the left ventricle as well as possible left atrial dilatation. No pericardial effusion. Unremarkable distal thoracic esophagus.  Abdomen: Unremarkable CT appearance of the stomach, duodenum, adrenal glands and pancreas. Stable 2.8 cm low-attenuation lesion within the spleen and dating back to November of 2011. This almost certainly represents a benign hemangioma or hand marked Thoma. Normal hepatic contour morphology. No discrete lesion. Distended gallbladder with high attenuation material layering in the gallbladder lumen.  Malrotated kidneys bilaterally. No enhancing renal mass, hydronephrosis or a perinephric fluid. 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  Redundant sigmoid colon. No focal bowel wall thickening or evidence of obstruction. No free fluid or suspicious adenopathy.  Pelvis: Surgical changes of prior hysterectomy. Pelvic floor laxity. Unremarkable bladder. No free fluid or suspicious adenopathy.  Bones/Soft Tissues: No acute fracture or aggressive appearing lytic or blastic osseous lesion. Multilevel degenerative disc disease and lower lumbar facet  arthropathy. Bilateral knee osteoarthritis.  IMPRESSION: VASCULAR  1. Solitary focal high-grade stenosis of the right distal SFA. Intact three-vessel runoff to the right ankle. 2. On the left, there are at least 3 focal moderate to high-grade stenoses involving the mid and distal SFA as well as a moderate focal stenosis of the  above the knee popliteal artery. 3. Two vessel runoff to the left ankle. The anterior tibial artery is occluded in the upper calf. 4. Focal moderate to high-grade stenosis of the origin of the right renal artery. 5. Focal high-grade stenosis of the origin of the dominant branch of the left profunda femoral artery. 6. Additional ancillary findings as detailed above. NON VASCULAR  1. Ancillary findings as detailed above.  Signed,  Criselda Peaches, MD  [facility]  [facility]   Electronically Signed   By: Jacqulynn Cadet M.D.   On: 01/27/2015 17:35   Ir US Guide Vasc Access Right  01/28/2015   CLINICAL DATA:  Peripheral arterial disease. The patient needs IV access for a CTA examination.  EXAM: PLACEMENT OF PERIPHERAL IV WITH ULTRASOUND GUIDANCE  Physician: Stephan Minister. Anselm Pancoast, MD  FLUOROSCOPY TIME:  None  MEDICATIONS AND MEDICAL HISTORY: None  ANESTHESIA/SEDATION: Moderate sedation time: None  CONTRAST:  None  PROCEDURE: The right upper arm was evaluated with ultrasound. A patent right brachial vein was identified. A tourniquet was placed. The arm was prepped with chlorhexidine and a sterile field was created. A 21 gauge needle was directed into a right brachial vein with ultrasound guidance. A wire was advanced centrally. A 5 French micropuncture dilator site was placed. Catheter secured to the skin. Catheter aspirated and flushed well.  FINDINGS: Intravenous catheter placed in the right brachial vein.  Estimated blood loss: Minimal  COMPLICATIONS: None  IMPRESSION: Successful placement of a peripheral IV with ultrasound guidance.    Electronically Signed   By: Markus Daft M.D.   On: 01/27/2015 17:20   [facility]iology Peripheral Guided Iv Start  01/27/2015   CLINICAL DATA:  Peripheral arterial disease. The patient needs IV access for a CTA examination.  EXAM: PLACEMENT OF PERIPHERAL IV WITH ULTRASOUND GUIDANCE  Physician: Stephan Minister. Anselm Pancoast, MD  FLUOROSCOPY TIME:  None  MEDICATIONS AND MEDICAL HISTORY: None  ANESTHESIA/SEDATION: Moderate sedation time: None  CONTRAST:  None  PROCEDURE: The right upper arm was evaluated with ultrasound. A patent right brachial vein was identified. A tourniquet was placed. The arm was prepped with chlorhexidine and a sterile field was created. A 21 gauge needle was directed into a right brachial vein with ultrasound guidance. A wire was advanced centrally. A 5 French micropuncture dilator site was placed. Catheter secured to the skin. Catheter aspirated and flushed well.  FINDINGS: Intravenous catheter placed in the right brachial vein.  Estimated blood loss: Minimal  COMPLICATIONS: None  IMPRESSION: Successful placement of a peripheral IV with ultrasound guidance.   Electronically Signed   By: Markus Daft M.D.   On: 01/27/2015 17:20    Labs:  CBC:  Recent Labs  08/30/14 0600 11/04/14 2035 11/05/14 0621 01/13/15 1325  WBC 7.0 13.2* 13.8* 10.5  HGB 9.9* 13.4 13.0 13.6  HCT 31.2* 41.7 40.1 42.3  PLT 232 329 318 206    COAGS:  Recent Labs  07/09/14 0415 01/13/15 1325  INR 1.11 1.05  APTT 23*  --     BMP:  Recent Labs  08/30/14 0600 08/30/14 1536 09/08/14 1158 11/04/14 2035 11/05/14 0621 01/13/15 1325  NA 134* 132* 137 130* 133* 132*  K 4.0 4.7 4.6 4.0 3.4* 5.1  CL 97 95* 103 87* 89* 96  CO2 29 28 26 30  32 19  GLUCOSE 114* 258* 136* 229* 144* 173*  BUN 42* 40* 34* 64* 64* 32*  CALCIUM 10.5 10.3 10.3 11.3* 11.1* 10.6*  CREATININE 1.24* 1.27* 1.18* 1.26* 1.27* 1.37*  GFRNONAA 39* 38*  --  38* 38*  --   GFRAA 45* 44*  --  44* 44*  --     LIVER FUNCTION TESTS:  Recent Labs   07/12/14 0357 08/28/14 1944 08/29/14 0407 11/04/14 2035  BILITOT 0.7 1.0 1.2 0.6  AST 18 17 15 31   ALT 18 28 24  38*  ALKPHOS 59 67 62 67  PROT 7.1 8.1 7.7 9.1*  ALBUMIN 3.0* 4.4 3.9 4.6    TUMOR MARKERS: No results for input(s): AFPTM, CEA, CA199, CHROMGRNA in the last 8760 hours.  Assessment and Plan:  Left foot ulcer- slow to no healing Hx diab Hx PVD Scheduled for LLE arteriogram with poss pta/stent/athrectomy Risks and Benefits discussed with the patient including, but not limited to bleeding, infection, vascular injury or contrast induced renal failure. All of the patient's questions were answered, patient is agreeable to proceed. Consent signed and in chart.   Thank you for this interesting consult.  I greatly enjoyed meeting Theresa H Wolz and look forward to participating in their care.  Signed: Drae Mitzel A 02/16/2015, 7:41 AM   I spent a total of  20 Minutes   in face to face in clinical consultation, greater than 50% of which was counseling/coordinating care for LLE endovascular revascularization

## 2015-02-16 NOTE — Progress Notes (Signed)
Referring Physician(s): Wagner,Jaime  Subjective:  LLE arteriogram with pta/stent/athrectomy Doing well in SSC\when RN notices Rt groin hematoma  Held pressure immediately Area marked on medial leg Pt alert No other complaints  Cbc ordered Rt groin doppler pending Dr Earleen Newport has seen and examined pt  Allergies: Ivp dye and Rocephin  Medications: Prior to Admission medications   Medication Sig Start Date End Date Taking? Authorizing Provider  acitretin (SORIATANE) 10 MG capsule Take 10 mg by mouth daily with supper. 08/01/13  Yes Historical Provider, MD  amLODipine (NORVASC) 10 MG tablet Take 10 mg by mouth daily.    Yes Historical Provider, MD  aspirin EC 81 MG tablet Take 81 mg by mouth daily.   Yes Historical Provider, MD  atorvastatin (LIPITOR) 20 MG tablet Take 20 mg by mouth at bedtime.    Yes Historical Provider, MD  benzonatate (TESSALON) 100 MG capsule Take 100 mg by mouth 3 (three) times daily as needed for cough.   Yes Historical Provider, MD  cefUROXime (CEFTIN) 500 MG tablet Take 500 mg by mouth 2 (two) times daily with a meal. 7 day course started 02/09/2015   Yes Historical Provider, MD  chlorthalidone (HYGROTON) 25 MG tablet Take 1 tablet (25 mg total) by mouth daily. 09/05/14  Yes Herminio Commons, MD  clobetasol (OLUX) 0.05 % topical foam Apply 1 application topically daily as needed (psoriasis).  04/24/13  Yes Historical Provider, MD  donepezil (ARICEPT) 10 MG tablet Take 1 tablet (10 mg total) by mouth at bedtime. Patient taking differently: Take 10 mg by mouth daily.  01/12/15  Yes Rebecca S Tat, DO  fexofenadine (ALLEGRA) 180 MG tablet Take 180 mg by mouth at bedtime.    Yes Historical Provider, MD  folic acid (FOLVITE) 1 MG tablet Take 1 tablet (1 mg total) by mouth daily. 08/30/14  Yes Nita Sells, MD  furosemide (LASIX) 40 MG tablet Take 40-120 mg by mouth daily. Based on degree of swelling/fluid buildup   Yes Historical Provider, MD  gabapentin  (NEURONTIN) 100 MG capsule Take 2 capsules (200 mg total) by mouth 2 (two) times daily. 07/14/14  Yes Geradine Girt, DO  hydrALAZINE (APRESOLINE) 100 MG tablet TAKE 1 TABLET BY MOUTH THREE TIMES DAILY 12/08/14  Yes Herminio Commons, MD  HYDROcodone-acetaminophen (NORCO) 7.5-325 MG per tablet Take 1 tablet by mouth every 6 (six) hours as needed (pain).  10/23/14  Yes Historical Provider, MD  insulin aspart (NOVOLOG FLEXPEN) 100 UNIT/ML FlexPen Inject 0-10 Units into the skin 3 (three) times daily with meals. Per sliding scale:  CBG 0-150 0 units, 151-200 2 units, 201-250 4 units, 251-300 6 nits, 301-350 8 units, 351-400 10 units, >400 call MD   Yes Historical Provider, MD  insulin glargine (LANTUS) 100 unit/mL SOPN Inject 60 Units into the skin 2 (two) times daily.   Yes Historical Provider, MD  iron polysaccharides (IFEREX 150) 150 MG capsule Take 150 mg by mouth daily.   Yes Historical Provider, MD  montelukast (SINGULAIR) 10 MG tablet Take 10 mg by mouth daily.    Yes Historical Provider, MD  nitroGLYCERIN (NITROSTAT) 0.4 MG SL tablet Place 0.4 mg under the tongue every 5 (five) minutes as needed for chest pain.   Yes Historical Provider, MD  Nutritional Supplements (ENSURE PO) Take 237 mLs by mouth See admin instructions. Take once or twice daily as a meal supplement   Yes Historical Provider, MD  pantoprazole (PROTONIX) 40 MG tablet Take 40 mg by mouth  daily.    Yes Historical Provider, MD  polyethylene glycol (MIRALAX / GLYCOLAX) packet Take 17 g by mouth daily. Patient taking differently: Take 17 g by mouth daily as needed (constipation).  11/05/14  Yes Estela Leonie Green, MD  ranolazine (RANEXA) 500 MG 12 hr tablet Take 1 tablet (500 mg total) by mouth 2 (two) times daily. 05/26/14  Yes Herminio Commons, MD  silver sulfADIAZINE (SILVADENE) 1 % cream Apply 1 application topically daily.   Yes Historical Provider, MD  spironolactone (ALDACTONE) 25 MG tablet Take 1 tablet (25 mg total) by  mouth daily. 08/16/13  Yes Kathie Dike, MD  zolpidem (AMBIEN) 5 MG tablet Take 5 mg by mouth at bedtime.    Yes Historical Provider, MD  albuterol (PROVENTIL HFA;VENTOLIN HFA) 108 (90 BASE) MCG/ACT inhaler Inhale 2 puffs into the lungs every 6 (six) hours as needed for wheezing or shortness of breath.    Historical Provider, MD  feeding supplement, GLUCERNA SHAKE, (GLUCERNA SHAKE) LIQD Take 237 mLs by mouth daily after supper. Patient not taking: Reported on 02/13/2015 07/14/14   Geradine Girt, DO  insulin aspart (NOVOLOG) 100 UNIT/ML injection SLIDING SCALE 0-9 Units, Injected subcutaneous, 3 times daily with meals. CBG < 70: implement hypoglycemia protocol CBG 70 - 120: 0 units CBG 121 - 150: 1 unit CBG 151 - 200: 2 units CBG 201 - 250: 3 units CBG 251 - 300: 5 units CBG 301 - 350: 7 units CBG 351 - 400: 9 units CBG > 400: call MD Patient not taking: Reported on 02/13/2015 10/20/11   Dayna N Dunn, PA-C     Vital Signs: BP 148/75 mmHg  Pulse 89  Temp(Src) 97.9 F (36.6 C) (Oral)  Resp 20  Ht 5\' 3"  (1.6 m)  Wt 189 lb (85.73 kg)  BMI 33.49 kg/m2  SpO2 100%  LMP 08/29/1990  Physical Exam  Constitutional: She is oriented to person, place, and time.  Cardiovascular: Normal rate and regular rhythm.   Pulmonary/Chest: Effort normal.  Abdominal: Soft. There is no tenderness.  Musculoskeletal:  Rt groin +hematoma Mostly to medial thigh Marked area sandbag now to groin Rt foot 1+ pulses  Neurological: She is alert and oriented to person, place, and time.  Nursing note and vitals reviewed.   Imaging: No results found.  Labs:  CBC:  Recent Labs  11/04/14 2035 11/05/14 0621 01/13/15 1325 02/16/15 0749  WBC 13.2* 13.8* 10.5 6.0  HGB 13.4 13.0 13.6 13.5  HCT 41.7 40.1 42.3 39.6  PLT 329 318 206 266    COAGS:  Recent Labs  07/09/14 0415 01/13/15 1325 02/16/15 0749  INR 1.11 1.05 1.08  APTT 23*  --  24    BMP:  Recent Labs  08/30/14 1536   11/04/14 2035 11/05/14 0621 01/13/15 1325 02/16/15 0749  NA 132*  < > 130* 133* 132* 134*  K 4.7  < > 4.0 3.4* 5.1 2.7*  CL 95*  < > 87* 89* 96 97*  CO2 28  < > 30 32 19 27  GLUCOSE 258*  < > 229* 144* 173* 125*  BUN 40*  < > 64* 64* 32* <5*  CALCIUM 10.3  < > 11.3* 11.1* 10.6* 9.3  CREATININE 1.27*  < > 1.26* 1.27* 1.37* 0.80  GFRNONAA 38*  --  38* 38*  --  >60  GFRAA 44*  --  44* 44*  --  >60  < > = values in this interval not displayed.  LIVER FUNCTION TESTS:  Recent Labs  07/12/14 0357 08/28/14 1944 08/29/14 0407 11/04/14 2035  BILITOT 0.7 1.0 1.2 0.6  AST 18 17 15 31   ALT 18 28 24  38*  ALKPHOS 59 67 62 67  PROT 7.1 8.1 7.7 9.1*  ALBUMIN 3.0* 4.4 3.9 4.6    Assessment and Plan:  Continue to hold pressure Now sandbag to Rt groin Dr Earleen Newport aware Cbc pending Doppler Rt groin pending  Signed: Jaqueline Uber A 02/16/2015, 3:20 PM   I spent a total of 15 Minutes in face to face in clinical consultation/evaluation, greater than 50% of which was counseling/coordinating care for LLE arteriogram with revascularization

## 2015-02-16 NOTE — Sedation Documentation (Signed)
Pressure to right groin released. And Level 0 with dressing clean dry and intact, pedal pulses DP and pt, left and right heard with doppler

## 2015-02-16 NOTE — Procedures (Signed)
Interventional Radiology Procedure Note  Indication: [place] 5 category CLI Procedure:  Left lower angiogram, atherectomy and drug eluting balloon (DAART therapy) of critical stenosis of the SFA, with improved flow through the fem-pop and tibial vessels. Right CFA access, with closure with Exoseal device. Complications: None Recommendations:  - leg straight for 6 hours - gentle iv fluid hydration - 60 days of plavix, 75mg  PO once daily - Follow up in clinic with Tevita Gomer in 2-4 weeks with repeat ABI and doppler eval - Ok to shower tomorrow  - Routine care of CFA access   Signed,  Dulcy Fanny. Earleen Newport, DO

## 2015-02-16 NOTE — Sedation Documentation (Addendum)
Confirmed that patient is forgetful with daughter. Verified her date of birth with her daughter. Confirmed NPO and pre meds given for dye allergy. Daughter stated patient did have her insulin this am. Dr Earleen Newport aware. Left and right DP and PT pulses heard by doppler, not palpated

## 2015-02-18 ENCOUNTER — Ambulatory Visit: Payer: Medicare Other | Admitting: Cardiovascular Disease

## 2015-02-27 ENCOUNTER — Other Ambulatory Visit: Payer: Self-pay | Admitting: *Deleted

## 2015-02-27 ENCOUNTER — Other Ambulatory Visit: Payer: Self-pay | Admitting: Interventional Radiology

## 2015-02-27 ENCOUNTER — Other Ambulatory Visit: Payer: Self-pay | Admitting: Podiatry

## 2015-02-27 DIAGNOSIS — I739 Peripheral vascular disease, unspecified: Secondary | ICD-10-CM

## 2015-03-23 ENCOUNTER — Ambulatory Visit (INDEPENDENT_AMBULATORY_CARE_PROVIDER_SITE_OTHER): Payer: Medicare Other | Admitting: Cardiovascular Disease

## 2015-03-23 VITALS — BP 138/58 | HR 94 | Ht 64.0 in

## 2015-03-23 DIAGNOSIS — G4733 Obstructive sleep apnea (adult) (pediatric): Secondary | ICD-10-CM

## 2015-03-23 DIAGNOSIS — I2510 Atherosclerotic heart disease of native coronary artery without angina pectoris: Secondary | ICD-10-CM | POA: Diagnosis not present

## 2015-03-23 DIAGNOSIS — E785 Hyperlipidemia, unspecified: Secondary | ICD-10-CM

## 2015-03-23 DIAGNOSIS — Z9289 Personal history of other medical treatment: Secondary | ICD-10-CM

## 2015-03-23 DIAGNOSIS — I10 Essential (primary) hypertension: Secondary | ICD-10-CM

## 2015-03-23 DIAGNOSIS — I739 Peripheral vascular disease, unspecified: Secondary | ICD-10-CM

## 2015-03-23 DIAGNOSIS — Z87898 Personal history of other specified conditions: Secondary | ICD-10-CM

## 2015-03-23 NOTE — Progress Notes (Signed)
Patient ID: [place] H Steller, female   DOB: Feb 06, 1929, 81 y.o.   MRN: [id number]      SUBJECTIVE: The patient presents for routine follow-up for chronic diastolic heart failure, malignant hypertension, and coronary artery disease. Her blood pressure is well controlled. She denies chest pain and shortness of breath. She underwent directional atherectomy and percutaneous intervention for critical left SFA stenosis in June.  Shortly after her procedure she sustained a syncopal episode and was hospitalized at [facility]. This was on 02/17/15. She was hypokalemic. Her daughter tells me that her oxygen saturations were initially 37% and systolic blood pressure was 88, but she stabilized by the time she was hospitalized. She was found to be anemic and was given 2 units of packed red blood cells. She has been undergoing rehabilitation for the past one month and is now able to walk 5 feet with the use of parallel bars.    Review of Systems: As per "subjective", otherwise negative.  Allergies  Allergen Reactions  . Ivp Dye [Iodinated Diagnostic Agents] Anaphylaxis  . Rocephin [Ceftriaxone] Diarrhea and Nausea And Vomiting    Current Outpatient Prescriptions  Medication Sig Dispense Refill  . acitretin (SORIATANE) 10 MG capsule Take 10 mg by mouth daily with supper.    Marland Kitchen albuterol (PROVENTIL HFA;VENTOLIN HFA) 108 (90 BASE) MCG/ACT inhaler Inhale 2 puffs into the lungs every 6 (six) hours as needed for wheezing or shortness of breath.    Marland Kitchen amLODipine (NORVASC) 10 MG tablet Take 10 mg by mouth daily.     Marland Kitchen aspirin EC 81 MG tablet Take 81 mg by mouth daily.    Marland Kitchen atorvastatin (LIPITOR) 20 MG tablet Take 20 mg by mouth at bedtime.     . benzonatate (TESSALON) 100 MG capsule Take 100 mg by mouth 3 (three) times daily as needed for cough.    . chlorthalidone (HYGROTON) 25 MG tablet Take 1 tablet (25 mg total) by mouth daily. 90 tablet 3  . clobetasol (OLUX) 0.05 % topical foam Apply 1 application topically  daily as needed (psoriasis).     Marland Kitchen donepezil (ARICEPT) 10 MG tablet Take 1 tablet (10 mg total) by mouth at bedtime. (Patient taking differently: Take 10 mg by mouth daily. ) 30 tablet 5  . feeding supplement, GLUCERNA SHAKE, (GLUCERNA SHAKE) LIQD Take 237 mLs by mouth daily after supper. 12 Can 2  . fexofenadine (ALLEGRA) 180 MG tablet Take 180 mg by mouth at bedtime.     . folic acid (FOLVITE) 1 MG tablet Take 1 tablet (1 mg total) by mouth daily. 30 tablet 0  . furosemide (LASIX) 40 MG tablet Take 40-120 mg by mouth daily. Based on degree of swelling/fluid buildup    . gabapentin (NEURONTIN) 100 MG capsule Take 2 capsules (200 mg total) by mouth 2 (two) times daily. 120 capsule 0  . hydrALAZINE (APRESOLINE) 50 MG tablet Take 50 mg by mouth 2 (two) times daily.    Marland Kitchen HYDROcodone-acetaminophen (NORCO) 7.5-325 MG per tablet Take 1 tablet by mouth every 6 (six) hours as needed (pain).     . insulin aspart (NOVOLOG FLEXPEN) 100 UNIT/ML FlexPen Inject 0-10 Units into the skin 3 (three) times daily with meals. Per sliding scale:  CBG 0-150 0 units, 151-200 2 units, 201-250 4 units, 251-300 6 nits, 301-350 8 units, 351-400 10 units, >400 call MD    . insulin aspart (NOVOLOG) 100 UNIT/ML injection SLIDING SCALE 0-9 Units, Injected subcutaneous, 3 times daily with meals. CBG < 70: implement hypoglycemia  protocol CBG 70 - 120: 0 units CBG 121 - 150: 1 unit CBG 151 - 200: 2 units CBG 201 - 250: 3 units CBG 251 - 300: 5 units CBG 301 - 350: 7 units CBG 351 - 400: 9 units CBG > 400: call MD    . insulin glargine (LANTUS) 100 unit/mL SOPN Inject 60 Units into the skin 2 (two) times daily.    . iron polysaccharides (IFEREX 150) 150 MG capsule Take 150 mg by mouth daily.    . montelukast (SINGULAIR) 10 MG tablet Take 10 mg by mouth daily.     . nitroGLYCERIN (NITROSTAT) 0.4 MG SL tablet Place 0.4 mg under the tongue every 5 (five) minutes as needed for chest pain.    . Nutritional Supplements (ENSURE PO)  Take 237 mLs by mouth See admin instructions. Take once or twice daily as a meal supplement    . pantoprazole (PROTONIX) 40 MG tablet Take 40 mg by mouth daily.     . polyethylene glycol (MIRALAX / GLYCOLAX) packet Take 17 g by mouth daily. (Patient taking differently: Take 17 g by mouth daily as needed (constipation). ) 14 each 0  . ranolazine (RANEXA) 500 MG 12 hr tablet Take 1 tablet (500 mg total) by mouth 2 (two) times daily. 28 tablet 0  . silver sulfADIAZINE (SILVADENE) 1 % cream Apply 1 application topically daily.    Marland Kitchen spironolactone (ALDACTONE) 25 MG tablet Take 1 tablet (25 mg total) by mouth daily. 30 tablet 1  . zolpidem (AMBIEN) 5 MG tablet Take 5 mg by mouth at bedtime.      No current facility-administered medications for this visit.    Past Medical History  Diagnosis Date  . Hypertension   . Nephrolithiasis   . GERD (gastroesophageal reflux disease)   . Arthritis   . Psoriasis   . Morbid obesity   . Mitral regurgitation   . Hyperlipidemia   . Sleep apnea     uses cpap  . CHF (congestive heart failure)     Diastolic. EF 55-65% by cath 10/11/11  . Diabetes mellitus     insulin dependent  . Acute renal insufficiency     09/2011. not on ACEI secondary to this (also has renal artery stenosis)  . Peripheral vascular disease     severe right renal artery stenosis and left SFA stenosis by PV angio 09/2011, treated medically  . Cellulitis   . CAD (coronary artery disease)     NSTEMI 09/2011 felt poor candidate for CABG, instead s/p PTCA/DES to mid RCA 10/17/11  . Chronic respiratory failure   . Pulmonary hypertension   . Obstructive sleep apnea   . CKD (chronic kidney disease) stage 2, GFR 60-89 ml/min 07/11/2013  . Vitamin D deficiency     Past Surgical History  Procedure Laterality Date  . Acromio-clavicular joint repair  2011  . Abdominal hysterectomy    . Eye surgery      cataract  OD  . Lower extremity angiogram N/A 10/11/2011    Procedure: LOWER EXTREMITY  ANGIOGRAM;  Surgeon: Sherren Mocha, MD;  Location: [facility] CATH LAB;  Service: Cardiovascular;  Laterality: N/A;  . Left and right heart catheterization with coronary angiogram N/A 10/11/2011    Procedure: LEFT AND RIGHT HEART CATHETERIZATION WITH CORONARY ANGIOGRAM;  Surgeon: Sherren Mocha, MD;  Location: [facility] CATH LAB;  Service: Cardiovascular;  Laterality: N/A;  . Percutaneous coronary stent intervention (pci-s) N/A 10/17/2011    Procedure: PERCUTANEOUS CORONARY STENT INTERVENTION (PCI-S);  Surgeon:  Burnell Blanks, MD;  Location: [facility] CATH LAB;  Service: Cardiovascular;  Laterality: N/A;    History   Social History  . Marital Status: Divorced    Spouse Name: N/A  . Number of Children: N/A  . Years of Education: N/A   Occupational History  . Not on file.   Social History Main Topics  . Smoking status: Never Smoker   . Smokeless tobacco: Never Used  . Alcohol Use: No  . Drug Use: No  . Sexual Activity: No   Other Topics Concern  . Not on file   Social History Narrative     Filed Vitals:   03/23/15 1543  BP: 138/58  Pulse: 94  Height: 5\' 4"  (1.626 m)  SpO2: 99%    PHYSICAL EXAM General: NAD Neck: No JVD, no thyromegaly.  Lungs: Clear to auscultation bilaterally, no wheezes or rales. CV: Nondisplaced PMI. Regular rate and rhythm, normal S1/S2, no S3/S4, soft I/VI systolic murmur along left sternal border. Mild left leg swelling, non pitting. Bandaged. No carotid bruit.  Abdomen: Soft, obese, nontender,no distention.  Neurologic: Alert. Psych: Normal affect.    ECG: Most recent ECG reviewed.    ASSESSMENT AND PLAN:  1. Chronic diastolic heart failure: Compensated and euvolemic. Continue Lasix, along with hydralazine and spironolactone. Echo in 11/15 showed normal RV size and function with mild pulmonary hypertension.  2. Malignant hypertension: Well controlled today. She is not on an ACEI reportedly because of severe right renal artery stenosis. Continue  amlodipine 10 mg , hydralazine 50 mg two times a day, chlorthalidone 25 mg, and spironolactone 25 mg.  3. CAD: Symptomatically stable. Resting nuclear images in 06/2014 did not show ischemia, and showed thinning in the basal anterolateral wall and inferolateral wall.  Has h/o PCI to RCA in 2013, with cath demonstrating 2-vessel disease in the RCA and with severe left circumflex disease (serial 90-95% mid LCx lesions by cath on 10-11-2011). Repeat nuclear myocardial perfusion study stress was normal in November of 2014. Continue ASA, Ranexa, and atorvastatin. Not on beta blocker due to bradycardia.  4. OSA: She has been noncompliant with CPAP lately.   5. PVD: Severe right renal artery stenosis and recent directional atherectomy and percutaneous intervention for left SFA critical stenosis.   6. Hyperlipidemia: On Lipitor 20 mg daily.   7. Pulmonary: Follows up with pulmonary. Echo in 11/15 showed normal RV size and function with mild pulmonary hypertension.  Dispo: f/u 6 months.   Kate Sable, M.D., F.A.C.C.

## 2015-03-23 NOTE — Patient Instructions (Signed)
Continue all current medications. Your physician wants you to follow up in: 6 months.  You will receive a reminder letter in the mail one-two months in advance.  If you don't receive a letter, please call our office to schedule the follow up appointment   

## 2015-03-25 ENCOUNTER — Other Ambulatory Visit: Payer: Self-pay | Admitting: Podiatry

## 2015-03-25 ENCOUNTER — Other Ambulatory Visit: Payer: Self-pay | Admitting: Interventional Radiology

## 2015-03-25 ENCOUNTER — Ambulatory Visit
Admission: RE | Admit: 2015-03-25 | Discharge: 2015-03-25 | Disposition: A | Discharge status: Home / Self Care | Payer: Medicare Other | Source: Ambulatory Visit | Attending: Interventional Radiology | Admitting: Interventional Radiology

## 2015-03-25 ENCOUNTER — Ambulatory Visit (HOSPITAL_COMMUNITY)
Admission: RE | Admit: 2015-03-25 | Discharge: 2015-03-25 | Disposition: A | Discharge status: Home / Self Care | Payer: Medicare Other | Source: Ambulatory Visit | Attending: Podiatry | Admitting: Podiatry

## 2015-03-25 ENCOUNTER — Ambulatory Visit (HOSPITAL_COMMUNITY): Admission: RE | Admit: 2015-03-25 | Payer: Medicare Other | Source: Ambulatory Visit

## 2015-03-25 DIAGNOSIS — I739 Peripheral vascular disease, unspecified: Secondary | ICD-10-CM

## 2015-03-25 DIAGNOSIS — I70229 Atherosclerosis of native arteries of extremities with rest pain, unspecified extremity: Secondary | ICD-10-CM | POA: Insufficient documentation

## 2015-03-25 DIAGNOSIS — I872 Venous insufficiency (chronic) (peripheral): Secondary | ICD-10-CM

## 2015-03-25 DIAGNOSIS — L97529 Non-pressure chronic ulcer of other part of left foot with unspecified severity: Secondary | ICD-10-CM

## 2015-03-25 DIAGNOSIS — E08621 Diabetes mellitus due to underlying condition with foot ulcer: Secondary | ICD-10-CM | POA: Insufficient documentation

## 2015-03-25 DIAGNOSIS — I998 Other disorder of circulatory system: Secondary | ICD-10-CM | POA: Insufficient documentation

## 2015-03-25 NOTE — Progress Notes (Signed)
Called to do a PIV for CTA.   Nothing to attempt.   IR has put in her PIVs for prior exams due to difficultly obtaining access.   Has a PAC but is is a non power port.   CT staff notifying radiologist.

## 2015-03-25 NOTE — Progress Notes (Signed)
Chief Complaint: Patient was seen in consultation today for  Chief Complaint  Patient presents with  . Follow-up   at the request of [facility]  Referring Physician(s): Dr Irving Shows, Podiatry, [facility], [place], [place].   History of Present Illness: Theresa Mclean is a 81 y.o. female with a history of a left foot diabetic foot ulcer, at the base of the hallux at the great toe. She has associated comorbidities including diabetes.  Theresa Mclean underwent revascularization of a critical stenosis of the superficial femoral artery 02/16/2015. She returns today for reevaluation in the clinic. She reports that she thinks the wound has been improving, and in fact on physical examination there is decreasing size of the wound. Previously the wound was a dry wound, and now has wet margins indicating improved blood flow.  Theresa Mclean and her daughter do say that she had some extensive bruising/ecchymosis after our procedure previously including the hip and pelvis region, attributable to a hematoma.  Currently Theresa Mclean is in a nursing facility/rehabilitation facility [facility]. She is receiving foot care at the nursing facility, though has not seen podiatry or a wound care center after the revascularization.  Past Medical History  Diagnosis Date  . Hypertension   . Nephrolithiasis   . GERD (gastroesophageal reflux disease)   . Arthritis   . Psoriasis   . Morbid obesity   . Mitral regurgitation   . Hyperlipidemia   . Sleep apnea     uses cpap  . CHF (congestive heart failure)     Diastolic. EF 55-65% by cath 10/11/11  . Diabetes mellitus     insulin dependent  . Acute renal insufficiency     09/2011. not on ACEI secondary to this (also has renal artery stenosis)  . Peripheral vascular disease     severe right renal artery stenosis and left SFA stenosis by PV angio 09/2011, treated medically  . Cellulitis   . CAD (coronary artery disease)     NSTEMI 09/2011  felt poor candidate for CABG, instead s/p PTCA/DES to mid RCA 10/17/11  . Chronic respiratory failure   . Pulmonary hypertension   . Obstructive sleep apnea   . CKD (chronic kidney disease) stage 2, GFR 60-89 ml/min 07/11/2013  . Vitamin D deficiency     Past Surgical History  Procedure Laterality Date  . Acromio-clavicular joint repair  2011  . Abdominal hysterectomy    . Eye surgery      cataract  OD  . Lower extremity angiogram N/A 10/11/2011    Procedure: LOWER EXTREMITY ANGIOGRAM;  Surgeon: Sherren Mocha, MD;  Location: [facility] CATH LAB;  Service: Cardiovascular;  Laterality: N/A;  . Left and right heart catheterization with coronary angiogram N/A 10/11/2011    Procedure: LEFT AND RIGHT HEART CATHETERIZATION WITH CORONARY ANGIOGRAM;  Surgeon: Sherren Mocha, MD;  Location: [facility] CATH LAB;  Service: Cardiovascular;  Laterality: N/A;  . Percutaneous coronary stent intervention (pci-s) N/A 10/17/2011    Procedure: PERCUTANEOUS CORONARY STENT INTERVENTION (PCI-S);  Surgeon: Burnell Blanks, MD;  Location: [facility] CATH LAB;  Service: Cardiovascular;  Laterality: N/A;    Allergies: Ivp dye and Rocephin  Medications: Prior to Admission medications   Medication Sig Start Date End Date Taking? Authorizing Provider  acitretin (SORIATANE) 10 MG capsule Take 10 mg by mouth daily with supper. 08/01/13   Historical Provider, MD  albuterol (PROVENTIL HFA;VENTOLIN HFA) 108 (90 BASE) MCG/ACT inhaler Inhale 2 puffs into the lungs every 6 (six) hours  as needed for wheezing or shortness of breath.    Historical Provider, MD  amLODipine (NORVASC) 10 MG tablet Take 10 mg by mouth daily.     Historical Provider, MD  aspirin EC 81 MG tablet Take 81 mg by mouth daily.    Historical Provider, MD  atorvastatin (LIPITOR) 20 MG tablet Take 20 mg by mouth at bedtime.     Historical Provider, MD  benzonatate (TESSALON) 100 MG capsule Take 100 mg by mouth 3 (three) times daily as needed for cough.    Historical  Provider, MD  chlorthalidone (HYGROTON) 25 MG tablet Take 1 tablet (25 mg total) by mouth daily. 09/05/14   Herminio Commons, MD  clobetasol (OLUX) 0.05 % topical foam Apply 1 application topically daily as needed (psoriasis).  04/24/13   Historical Provider, MD  donepezil (ARICEPT) 10 MG tablet Take 1 tablet (10 mg total) by mouth at bedtime. Patient taking differently: Take 10 mg by mouth daily.  01/12/15   Eustace Quail Tat, DO  feeding supplement, GLUCERNA SHAKE, (GLUCERNA SHAKE) LIQD Take 237 mLs by mouth daily after supper. 07/14/14   Geradine Girt, DO  fexofenadine (ALLEGRA) 180 MG tablet Take 180 mg by mouth at bedtime.     Historical Provider, MD  folic acid (FOLVITE) 1 MG tablet Take 1 tablet (1 mg total) by mouth daily. 08/30/14   Nita Sells, MD  furosemide (LASIX) 40 MG tablet Take 40-120 mg by mouth daily. Based on degree of swelling/fluid buildup    Historical Provider, MD  gabapentin (NEURONTIN) 100 MG capsule Take 2 capsules (200 mg total) by mouth 2 (two) times daily. 07/14/14   Geradine Girt, DO  hydrALAZINE (APRESOLINE) 50 MG tablet Take 50 mg by mouth 2 (two) times daily.    Historical Provider, MD  HYDROcodone-acetaminophen (NORCO) 7.5-325 MG per tablet Take 1 tablet by mouth every 6 (six) hours as needed (pain).  10/23/14   Historical Provider, MD  insulin aspart (NOVOLOG FLEXPEN) 100 UNIT/ML FlexPen Inject 0-10 Units into the skin 3 (three) times daily with meals. Per sliding scale:  CBG 0-150 0 units, 151-200 2 units, 201-250 4 units, 251-300 6 nits, 301-350 8 units, 351-400 10 units, >400 call MD    Historical Provider, MD  insulin aspart (NOVOLOG) 100 UNIT/ML injection SLIDING SCALE 0-9 Units, Injected subcutaneous, 3 times daily with meals. CBG < 70: implement hypoglycemia protocol CBG 70 - 120: 0 units CBG 121 - 150: 1 unit CBG 151 - 200: 2 units CBG 201 - 250: 3 units CBG 251 - 300: 5 units CBG 301 - 350: 7 units CBG 351 - 400: 9 units CBG > 400: call MD 10/20/11    [name], PA-C  insulin glargine (LANTUS) 100 unit/mL SOPN Inject 60 Units into the skin 2 (two) times daily.    Historical Provider, MD  iron polysaccharides (IFEREX 150) 150 MG capsule Take 150 mg by mouth daily.    Historical Provider, MD  montelukast (SINGULAIR) 10 MG tablet Take 10 mg by mouth daily.     Historical Provider, MD  nitroGLYCERIN (NITROSTAT) 0.4 MG SL tablet Place 0.4 mg under the tongue every 5 (five) minutes as needed for chest pain.    Historical Provider, MD  Nutritional Supplements (ENSURE PO) Take 237 mLs by mouth See admin instructions. Take once or twice daily as a meal supplement    Historical Provider, MD  pantoprazole (PROTONIX) 40 MG tablet Take 40 mg by mouth daily.  Historical Provider, MD  polyethylene glycol (MIRALAX / GLYCOLAX) packet Take 17 g by mouth daily. Patient taking differently: Take 17 g by mouth daily as needed (constipation).  11/05/14   Erline Hau, MD  ranolazine (RANEXA) 500 MG 12 hr tablet Take 1 tablet (500 mg total) by mouth 2 (two) times daily. 05/26/14   Herminio Commons, MD  silver sulfADIAZINE (SILVADENE) 1 % cream Apply 1 application topically daily.    Historical Provider, MD  spironolactone (ALDACTONE) 25 MG tablet Take 1 tablet (25 mg total) by mouth daily. 08/16/13   Kathie Dike, MD  zolpidem (AMBIEN) 5 MG tablet Take 5 mg by mouth at bedtime.     Historical Provider, MD     Family History  Problem Relation Age of Onset  . Psoriasis Brother     History   Social History  . Marital Status: Divorced    Spouse Name: N/A  . Number of Children: N/A  . Years of Education: N/A   Social History Main Topics  . Smoking status: Never Smoker   . Smokeless tobacco: Never Used  . Alcohol Use: No  . Drug Use: No  . Sexual Activity: No   Other Topics Concern  . Not on file   Social History Narrative     Review of Systems: A 12 point ROS discussed and pertinent positives are indicated in the HPI above.  All  other systems are negative.  Review of Systems  Vital Signs: BP 143/64 mmHg  Pulse 88  Temp(Src) 97.8 F (36.6 C) (Oral)  Resp 14  SpO2 97%  LMP 08/29/1990  Physical Exam   Targeted physical exam of the left foot demonstrates improving size of the diabetic foot wound at the medial base of the hallux overlying the metatarsal head. When previously this was a dry wound, the wound has now become wet. No significant redness at the margin of the wound, which is now approximately the size of a nickel, previously a half-dollar.  In addition there was a beginning ulcer at the tip of the great toe, which is now resolved. No other new ulcers on the left foot.    Imaging: No results found.  Labs:  CBC:  Recent Labs  11/05/14 0621 01/13/15 1325 02/16/15 0749 02/16/15 1650  WBC 13.8* 10.5 6.0 8.9  HGB 13.0 13.6 13.5 12.6  HCT 40.1 42.3 39.6 37.7  PLT 318 206 266 272    COAGS:  Recent Labs  07/09/14 0415 01/13/15 1325 02/16/15 0749  INR 1.11 1.05 1.08  APTT 23*  --  24    BMP:  Recent Labs  08/30/14 1536  11/04/14 2035 11/05/14 0621 01/13/15 1325 02/16/15 0749  NA 132*  < > 130* 133* 132* 134*  K 4.7  < > 4.0 3.4* 5.1 2.7*  CL 95*  < > 87* 89* 96 97*  CO2 28  < > 30 32 19 27  GLUCOSE 258*  < > 229* 144* 173* 125*  BUN 40*  < > 64* 64* 32* <5*  CALCIUM 10.3  < > 11.3* 11.1* 10.6* 9.3  CREATININE 1.27*  < > 1.26* 1.27* 1.37* 0.80  GFRNONAA 38*  --  38* 38*  --  >60  GFRAA 44*  --  44* 44*  --  >60  < > = values in this interval not displayed.  LIVER FUNCTION TESTS:  Recent Labs  07/12/14 0357 08/28/14 1944 08/29/14 0407 11/04/14 2035  BILITOT 0.7 1.0 1.2 0.6  AST 18  17 15 31   ALT 18 28 24  38*  ALKPHOS 59 67 62 67  PROT 7.1 8.1 7.7 9.1*  ALBUMIN 3.0* 4.4 3.9 4.6    TUMOR MARKERS: No results for input(s): AFPTM, CEA, CA199, CHROMGRNA in the last 8760 hours.  Assessment and Plan:  Mrs. Cavitt is an 81 year old female with a history of critical  limb ischemia, Rutherford 5 classification symptoms, with a diabetic foot wound. She is status post revascularization of a critical lesion in the superficial femoral artery. We know she has tibial disease, though the decision was made to start with a critical stenosis in the superficial femoral artery before further treatment.  She is in fact healing her diabetic foot wound after revascularization. She has not seen podiatry or wound care after her revascularization. I have discussed her case with Dr. Irving Shows of [facility] foot care, and he would like to see her again for foot care. I shared this with the patient, who will call Dr. Jacqulyn Ducking office.  We will establish a new baseline on today's visit with new ankle-brachial index and Doppler study of the lower extremity to evaluate the waveforms, status post revascularization.  I've encouraged Mrs. Torre and her daughter to call with any questions or concerns.  In addition to dedicated medical care for diabetes and foot care for her wound, I would recommend at least annual ankle-brachial index moving forward given her history of diabetic foot ulcer, in accordance with the new management guidelines for diabetic foot ulcers, published by vascular surgery, podiatric medicine, and the vascular medicine in 2016.   SignedCorrie Mckusick 03/25/2015, 11:48 AM   I spent a total of    15 Minutes in face to face in clinical consultation, greater than 50% of which was counseling/coordinating care for critical limb ischemia, diabetic foot wound, status post revascularization of left lower extremity.

## 2015-05-13 ENCOUNTER — Encounter: Payer: Self-pay | Admitting: *Deleted

## 2015-05-20 ENCOUNTER — Ambulatory Visit (INDEPENDENT_AMBULATORY_CARE_PROVIDER_SITE_OTHER): Payer: Medicare Other | Admitting: Physician Assistant

## 2015-05-20 ENCOUNTER — Encounter: Payer: Self-pay | Admitting: Physician Assistant

## 2015-05-20 VITALS — BP 118/58 | HR 86 | Ht 66.0 in

## 2015-05-20 DIAGNOSIS — I5032 Chronic diastolic (congestive) heart failure: Secondary | ICD-10-CM

## 2015-05-20 DIAGNOSIS — I10 Essential (primary) hypertension: Secondary | ICD-10-CM

## 2015-05-20 DIAGNOSIS — I2510 Atherosclerotic heart disease of native coronary artery without angina pectoris: Secondary | ICD-10-CM

## 2015-05-20 MED ORDER — FOLIC ACID 1 MG PO TABS: 1.0000 mg | ORAL_TABLET | Freq: Every day | ORAL | Status: DC

## 2015-05-20 MED ORDER — HYDRALAZINE HCL 50 MG PO TABS: 50.0000 mg | ORAL_TABLET | Freq: Two times a day (BID) | ORAL | Status: DC

## 2015-05-20 MED ORDER — ATORVASTATIN CALCIUM 20 MG PO TABS: 20.0000 mg | ORAL_TABLET | Freq: Every day | ORAL | Status: DC

## 2015-05-20 MED ORDER — ASPIRIN EC 81 MG PO TBEC: 81.0000 mg | DELAYED_RELEASE_TABLET | Freq: Every day | ORAL | Status: DC

## 2015-05-20 MED ORDER — AMLODIPINE BESYLATE 10 MG PO TABS: 10.0000 mg | ORAL_TABLET | Freq: Every day | ORAL | Status: DC

## 2015-05-20 MED ORDER — NITROGLYCERIN 0.4 MG SL SUBL: 0.4000 mg | SUBLINGUAL_TABLET | SUBLINGUAL | Status: DC | PRN

## 2015-05-20 MED ORDER — SPIRONOLACTONE 25 MG PO TABS: 25.0000 mg | ORAL_TABLET | Freq: Every day | ORAL | Status: DC

## 2015-05-20 MED ORDER — FUROSEMIDE 40 MG PO TABS: 40.0000 mg | ORAL_TABLET | Freq: Every day | ORAL | Status: DC

## 2015-05-20 NOTE — Assessment & Plan Note (Signed)
Blood pressure is surprisingly well controlled despite being out of her medications.

## 2015-05-20 NOTE — Assessment & Plan Note (Signed)
Patient has chronic diastolic heart failure. She's been out of her medications for over 2 weeks. Fortunately she is not in overt heart failure today. She does have some edema. We'll resume amlodipine 10 mg a day, hydralazine 50 mg twice a day, spironolactone 25 mg daily, Lasix 40 mg twice a day. Follow up with Dr. Bronson Ing in 2 weeks.

## 2015-05-20 NOTE — Assessment & Plan Note (Signed)
Stable without chest pain 

## 2015-05-20 NOTE — Patient Instructions (Signed)
Your physician recommends that you schedule a follow-up appointment in: 2 weeks with Dr. Bronson Ing  Your physician recommends that you continue on your current medications as directed. Please refer to the Current Medication list given to you today.   We have refilled you medications.   Thank you for choosing [facility]!

## 2015-05-20 NOTE — Progress Notes (Signed)
Cardiology Office Note   Date:  05/20/2015   ID:  Theresa Mclean, DOB 1929-03-31, MRN [id number]  PCP:  Glenda Chroman., MD  Cardiologist:  Dr. Bronson Ing  Chief Complaint: Out of medicine    History of Present Illness: Theresa Mclean is a 81 y.o. female who presents for follow-up. She has history of chronic diastolic heart failure, malignant hypertension and CAD. Resting nuclear images in 06/2014 did not show ischemia and showed thinning of the basal anterior lateral wall and inferior lateral wall. She had prior PCI to the RCA in 2013 with 2 vessel disease in the RCA and severe left circumflex disease serial 90-95% mid circumflex lesions. She doesn't take beta blocker due to bradycardia.   She had a syncopal episode and was hospitalized at [facility] in 01/2015. She was hypokalemic and anemic. She received 2 units of packed RBCs. She had been in a rehabilitation center until 2 weeks ago. She went home without any of her cardiac medications. Her son seems a bit confused but says the pharmacy would not fill them. She has some edema. She denies any chest pain, palpitations, dyspnea, dizziness or presyncope. She still getting rehabilitation at home. She cannot stand on the scale today to get weighed.    Past Medical History  Diagnosis Date  . Hypertension   . Nephrolithiasis   . GERD (gastroesophageal reflux disease)   . Arthritis   . Psoriasis   . Morbid obesity   . Mitral regurgitation   . Hyperlipidemia   . Sleep apnea     uses cpap  . CHF (congestive heart failure)     Diastolic. EF 55-65% by cath 10/11/11  . Diabetes mellitus     insulin dependent  . Acute renal insufficiency     09/2011. not on ACEI secondary to this (also has renal artery stenosis)  . Peripheral vascular disease     severe right renal artery stenosis and left SFA stenosis by PV angio 09/2011, treated medically  . Cellulitis   . CAD (coronary artery disease)     NSTEMI 09/2011 felt poor candidate for  CABG, instead s/p PTCA/DES to mid RCA 10/17/11  . Chronic respiratory failure   . Pulmonary hypertension   . Obstructive sleep apnea   . CKD (chronic kidney disease) stage 2, GFR 60-89 ml/min 07/11/2013  . Vitamin D deficiency     Past Surgical History  Procedure Laterality Date  . Acromio-clavicular joint repair  2011  . Abdominal hysterectomy    . Eye surgery      cataract  OD  . Lower extremity angiogram N/A 10/11/2011    Procedure: LOWER EXTREMITY ANGIOGRAM;  Surgeon: Sherren Mocha, MD;  Location: [facility] CATH LAB;  Service: Cardiovascular;  Laterality: N/A;  . Left and right heart catheterization with coronary angiogram N/A 10/11/2011    Procedure: LEFT AND RIGHT HEART CATHETERIZATION WITH CORONARY ANGIOGRAM;  Surgeon: Sherren Mocha, MD;  Location: [facility] CATH LAB;  Service: Cardiovascular;  Laterality: N/A;  . Percutaneous coronary stent intervention (pci-s) N/A 10/17/2011    Procedure: PERCUTANEOUS CORONARY STENT INTERVENTION (PCI-S);  Surgeon: Burnell Blanks, MD;  Location: [facility] CATH LAB;  Service: Cardiovascular;  Laterality: N/A;     Current Outpatient Prescriptions  Medication Sig Dispense Refill  . acitretin (SORIATANE) 10 MG capsule Take 10 mg by mouth daily with supper.    Marland Kitchen albuterol (PROVENTIL HFA;VENTOLIN HFA) 108 (90 BASE) MCG/ACT inhaler Inhale 2 puffs into the lungs every 6 (six) hours as needed for wheezing or  shortness of breath.    Marland Kitchen amLODipine (NORVASC) 10 MG tablet Take 10 mg by mouth daily.     Marland Kitchen aspirin EC 81 MG tablet Take 81 mg by mouth daily.    Marland Kitchen atorvastatin (LIPITOR) 20 MG tablet Take 20 mg by mouth at bedtime.     . benzonatate (TESSALON) 100 MG capsule Take 100 mg by mouth 3 (three) times daily as needed for cough.    . chlorthalidone (HYGROTON) 25 MG tablet Take 1 tablet (25 mg total) by mouth daily. 90 tablet 3  . clobetasol (OLUX) 0.05 % topical foam Apply 1 application topically daily as needed (psoriasis).     Marland Kitchen donepezil (ARICEPT) 10 MG tablet Take  1 tablet (10 mg total) by mouth at bedtime. (Patient taking differently: Take 10 mg by mouth daily. ) 30 tablet 5  . feeding supplement, GLUCERNA SHAKE, (GLUCERNA SHAKE) LIQD Take 237 mLs by mouth daily after supper. 12 Can 2  . fexofenadine (ALLEGRA) 180 MG tablet Take 180 mg by mouth at bedtime.     . folic acid (FOLVITE) 1 MG tablet Take 1 tablet (1 mg total) by mouth daily. 30 tablet 0  . furosemide (LASIX) 40 MG tablet Take 40-120 mg by mouth daily. Based on degree of swelling/fluid buildup    . gabapentin (NEURONTIN) 100 MG capsule Take 2 capsules (200 mg total) by mouth 2 (two) times daily. 120 capsule 0  . hydrALAZINE (APRESOLINE) 50 MG tablet Take 50 mg by mouth 2 (two) times daily.    Marland Kitchen HYDROcodone-acetaminophen (NORCO) 7.5-325 MG per tablet Take 1 tablet by mouth every 6 (six) hours as needed (pain).     . insulin aspart (NOVOLOG FLEXPEN) 100 UNIT/ML FlexPen Inject 0-10 Units into the skin 3 (three) times daily with meals. Per sliding scale:  CBG 0-150 0 units, 151-200 2 units, 201-250 4 units, 251-300 6 nits, 301-350 8 units, 351-400 10 units, >400 call MD    . insulin aspart (NOVOLOG) 100 UNIT/ML injection SLIDING SCALE 0-9 Units, Injected subcutaneous, 3 times daily with meals. CBG < 70: implement hypoglycemia protocol CBG 70 - 120: 0 units CBG 121 - 150: 1 unit CBG 151 - 200: 2 units CBG 201 - 250: 3 units CBG 251 - 300: 5 units CBG 301 - 350: 7 units CBG 351 - 400: 9 units CBG > 400: call MD    . insulin glargine (LANTUS) 100 unit/mL SOPN Inject 60 Units into the skin 2 (two) times daily.    . iron polysaccharides (IFEREX 150) 150 MG capsule Take 150 mg by mouth daily.    . montelukast (SINGULAIR) 10 MG tablet Take 10 mg by mouth daily.     . nitroGLYCERIN (NITROSTAT) 0.4 MG SL tablet Place 0.4 mg under the tongue every 5 (five) minutes as needed for chest pain.    . Nutritional Supplements (ENSURE PO) Take 237 mLs by mouth See admin instructions. Take once or twice daily as a  meal supplement    . pantoprazole (PROTONIX) 40 MG tablet Take 40 mg by mouth daily.     . polyethylene glycol (MIRALAX / GLYCOLAX) packet Take 17 g by mouth daily. (Patient taking differently: Take 17 g by mouth daily as needed (constipation). ) 14 each 0  . ranolazine (RANEXA) 500 MG 12 hr tablet Take 1 tablet (500 mg total) by mouth 2 (two) times daily. 28 tablet 0  . silver sulfADIAZINE (SILVADENE) 1 % cream Apply 1 application topically daily.    Marland Kitchen spironolactone (  ALDACTONE) 25 MG tablet Take 1 tablet (25 mg total) by mouth daily. 30 tablet 1  . zolpidem (AMBIEN) 5 MG tablet Take 5 mg by mouth at bedtime.      No current facility-administered medications for this visit.    Allergies:   Ivp dye and Rocephin    Social History:  The patient  reports that she has never smoked. She has never used smokeless tobacco. She reports that she does not drink alcohol or use illicit drugs.   Family History:  The patient's    family history includes Psoriasis in her brother.    ROS:  Please see the history of present illness.   Otherwise, review of systems are positive for left leg and foot pain where she has a sore.   All other systems are reviewed and negative.    PHYSICAL EXAM: VS:  Pulse 86  Ht 5\' 6"  (1.676 m)  Wt   SpO2 94%  LMP 08/29/1990 , BMI There is no weight on file to calculate BMI. GEN: Well nourished, well developed, in no acute distress Neck: no JVD, HJR, carotid bruits, or masses Cardiac: RRR; positive S4, no murmurs rubs, thrill or heave,  Respiratory:  clear to auscultation bilaterally, normal work of breathing GI: soft, nontender, nondistended, + BS MS: no deformity or atrophy Extremities: +1 edema bilaterally left greater than right without cyanosis, clubbing,  good distal pulses bilaterally.  Skin: warm and dry, no rash Neuro:  Strength and sensation are intact    EKG:  EKG is not ordered today.    Recent Labs: 08/28/2014: TSH 3.435 08/29/2014: B Natriuretic  Peptide 994.0* 11/04/2014: ALT 38* 02/16/2015: BUN <5*; Creatinine, Ser 0.80; Hemoglobin 12.6; Platelets 272; Potassium 2.7*; Sodium 134*    Lipid Panel No results found for: CHOL, TRIG, HDL, CHOLHDL, VLDL, LDLCALC, LDLDIRECT    Wt Readings from Last 3 Encounters:  02/16/15 189 lb (85.73 kg)  11/05/14 189 lb 11.2 oz (86.047 kg)  08/30/14 203 lb 7.8 oz (92.3 kg)      Other studies Reviewed: Additional studies/ records that were reviewed today include and review of the records demonstrates:  IMPRESSION: Rest myoview. Thinning in the basal anterolateral wall and inferolateral wall (base, mid). Otherwise normal perfusion. Images not gated     Electronically Signed   By: Dorris Carnes M.D.   On: 07/11/2014 13:14   Study Conclusions  - Left ventricle: The cavity size was normal. Wall thickness was   increased in a pattern of moderate LVH. Systolic function was   normal. The estimated ejection fraction was in the range of 60%   to 65%. Wall motion was normal; there were no regional wall   motion abnormalities. Doppler parameters are consistent with   abnormal left ventricular relaxation (grade 1 diastolic   dysfunction). - Aortic valve: Trileaflet; mildly calcified leaflets. There was   mild stenosis. Mean gradient (S): 12 mm Hg. - Mitral valve: There was trivial regurgitation. - Left atrium: The atrium was mildly to moderately dilated. - Right ventricle: The cavity size was normal. Systolic function   was normal. - Tricuspid valve: Peak RV-RA gradient (S): 24 mm Hg. - Pulmonary arteries: PA peak pressure: 39 mm Hg (S). - Systemic veins: IVC measured 2.6 cm with < 50% respirophasic   variation, suggesting RA pressure 15 mmHg.  Impressions:  - Normal LV size with moderate LV hypertrophy. EF 60-65%. Normal RV   size and systolic function. Mild aortic stenosis. Mild pulmonary   hypertension.  ASSESSMENT  AND PLAN:  Chronic diastolic heart failure Patient has chronic  diastolic heart failure. She's been out of her medications for over 2 weeks. Fortunately she is not in overt heart failure today. She does have some edema. We'll resume amlodipine 10 mg a day, hydralazine 50 mg twice a day, spironolactone 25 mg daily, Lasix 40 mg twice a day. Follow up with Dr. Bronson Ing in 2 weeks.  CAD (coronary artery disease) Stable without chest pain.  Essential hypertension Blood pressure is surprisingly well controlled despite being out of her medications.    Sumner Boast, PA-C  05/20/2015 12:58 PM    [facility] [place], [place], [place]  [address] Phone: [phone]; Fax: [phone]

## 2015-06-03 ENCOUNTER — Ambulatory Visit (INDEPENDENT_AMBULATORY_CARE_PROVIDER_SITE_OTHER): Payer: Medicare HMO | Admitting: Cardiovascular Disease

## 2015-06-03 VITALS — BP 120/50 | HR 86 | Ht 66.0 in

## 2015-06-03 DIAGNOSIS — N189 Chronic kidney disease, unspecified: Secondary | ICD-10-CM

## 2015-06-03 DIAGNOSIS — I10 Essential (primary) hypertension: Secondary | ICD-10-CM | POA: Diagnosis not present

## 2015-06-03 DIAGNOSIS — I5032 Chronic diastolic (congestive) heart failure: Secondary | ICD-10-CM | POA: Diagnosis not present

## 2015-06-03 DIAGNOSIS — I2510 Atherosclerotic heart disease of native coronary artery without angina pectoris: Secondary | ICD-10-CM

## 2015-06-03 DIAGNOSIS — I739 Peripheral vascular disease, unspecified: Secondary | ICD-10-CM

## 2015-06-03 DIAGNOSIS — E785 Hyperlipidemia, unspecified: Secondary | ICD-10-CM

## 2015-06-03 DIAGNOSIS — G4733 Obstructive sleep apnea (adult) (pediatric): Secondary | ICD-10-CM

## 2015-06-03 NOTE — Progress Notes (Signed)
Patient ID: Theresa Mclean, female   DOB: 14-Feb-1929, 81 y.o.   MRN: [id number]      SUBJECTIVE: The patient presents for routine follow-up for chronic diastolic heart failure, malignant hypertension, and coronary artery disease. Her blood pressure is well controlled. She denies chest pain and shortness of breath. She underwent directional atherectomy and percutaneous intervention for critical left SFA stenosis in June 2016.  Shortly after her procedure she sustained a syncopal episode and was hospitalized at [facility]. This was on 02/17/15. She was hypokalemic. Her daughter previously told me that her oxygen saturations were initially 40% and systolic blood pressure was 88, but she stabilized by the time she was hospitalized. She was found to be anemic and was given 2 units of packed red blood cells.  She saw Gerrianne Scale PA-C on 05/20/15 at which time she reported she had run of her medications for over 2 weeks. She was not in decompensated heart failure and her BP was controlled. Meds were restarted.  She is now feeling well other than some chronic back discomfort. She denies chest pain, shortness of breath, and leg swelling.  She is here with her son, [name], who reports that his mother has not had to use oxygen lately.   Review of Systems: As per "subjective", otherwise negative.  Allergies  Allergen Reactions  . Ivp Dye [Iodinated Diagnostic Agents] Anaphylaxis  . Rocephin [Ceftriaxone] Diarrhea and Nausea And Vomiting    Current Outpatient Prescriptions  Medication Sig Dispense Refill  . acitretin (SORIATANE) 10 MG capsule Take 10 mg by mouth daily with supper.    Marland Kitchen albuterol (PROVENTIL HFA;VENTOLIN HFA) 108 (90 BASE) MCG/ACT inhaler Inhale 2 puffs into the lungs every 6 (six) hours as needed for wheezing or shortness of breath.    Marland Kitchen amLODipine (NORVASC) 10 MG tablet Take 1 tablet (10 mg total) by mouth daily. 30 tablet 6  . aspirin 325 MG tablet Take 325 mg by mouth daily.    Marland Kitchen  atorvastatin (LIPITOR) 20 MG tablet Take 1 tablet (20 mg total) by mouth at bedtime. 30 tablet 6  . benzonatate (TESSALON) 100 MG capsule Take 100 mg by mouth 3 (three) times daily as needed for cough.    . chlorthalidone (HYGROTON) 25 MG tablet Take 1 tablet (25 mg total) by mouth daily. 90 tablet 3  . clobetasol (OLUX) 0.05 % topical foam Apply 1 application topically daily as needed (psoriasis).     Marland Kitchen donepezil (ARICEPT) 10 MG tablet Take 1 tablet (10 mg total) by mouth at bedtime. (Patient taking differently: Take 10 mg by mouth daily. ) 30 tablet 5  . feeding supplement, GLUCERNA SHAKE, (GLUCERNA SHAKE) LIQD Take 237 mLs by mouth daily after supper. 12 Can 2  . fexofenadine (ALLEGRA) 180 MG tablet Take 180 mg by mouth at bedtime.     . folic acid (FOLVITE) 1 MG tablet Take 1 tablet (1 mg total) by mouth daily. 30 tablet 6  . furosemide (LASIX) 40 MG tablet Take 1-3 tablets (40-120 mg total) by mouth daily. Based on degree of swelling/fluid buildup 30 tablet 6  . gabapentin (NEURONTIN) 100 MG capsule Take 2 capsules (200 mg total) by mouth 2 (two) times daily. 120 capsule 0  . hydrALAZINE (APRESOLINE) 50 MG tablet Take 1 tablet (50 mg total) by mouth 2 (two) times daily. 60 tablet 6  . HYDROcodone-acetaminophen (NORCO) 7.5-325 MG per tablet Take 1 tablet by mouth every 6 (six) hours as needed (pain).     . insulin  aspart (NOVOLOG FLEXPEN) 100 UNIT/ML FlexPen Inject 0-10 Units into the skin 3 (three) times daily with meals. Per sliding scale:  CBG 0-150 0 units, 151-200 2 units, 201-250 4 units, 251-300 6 nits, 301-350 8 units, 351-400 10 units, >400 call MD    . insulin aspart (NOVOLOG) 100 UNIT/ML injection SLIDING SCALE 0-9 Units, Injected subcutaneous, 3 times daily with meals. CBG < 70: implement hypoglycemia protocol CBG 70 - 120: 0 units CBG 121 - 150: 1 unit CBG 151 - 200: 2 units CBG 201 - 250: 3 units CBG 251 - 300: 5 units CBG 301 - 350: 7 units CBG 351 - 400: 9 units CBG > 400:  call MD    . insulin glargine (LANTUS) 100 unit/mL SOPN Inject 60 Units into the skin 2 (two) times daily.    . iron polysaccharides (IFEREX 150) 150 MG capsule Take 150 mg by mouth daily.    . montelukast (SINGULAIR) 10 MG tablet Take 10 mg by mouth daily.     . nitroGLYCERIN (NITROSTAT) 0.4 MG SL tablet Place 1 tablet (0.4 mg total) under the tongue every 5 (five) minutes as needed for chest pain. 25 tablet 3  . Nutritional Supplements (ENSURE PO) Take 237 mLs by mouth See admin instructions. Take once or twice daily as a meal supplement    . pantoprazole (PROTONIX) 40 MG tablet Take 40 mg by mouth daily.     . polyethylene glycol (MIRALAX / GLYCOLAX) packet Take 17 g by mouth daily. (Patient taking differently: Take 17 g by mouth daily as needed (constipation). ) 14 each 0  . ranolazine (RANEXA) 500 MG 12 hr tablet Take 1 tablet (500 mg total) by mouth 2 (two) times daily. 28 tablet 0  . silver sulfADIAZINE (SILVADENE) 1 % cream Apply 1 application topically daily.    Marland Kitchen spironolactone (ALDACTONE) 25 MG tablet Take 1 tablet (25 mg total) by mouth daily. 30 tablet 6  . zolpidem (AMBIEN) 5 MG tablet Take 5 mg by mouth at bedtime.      No current facility-administered medications for this visit.    Past Medical History  Diagnosis Date  . Hypertension   . Nephrolithiasis   . GERD (gastroesophageal reflux disease)   . Arthritis   . Psoriasis   . Morbid obesity   . Mitral regurgitation   . Hyperlipidemia   . Sleep apnea     uses cpap  . CHF (congestive heart failure)     Diastolic. EF 55-65% by cath 10/11/11  . Diabetes mellitus     insulin dependent  . Acute renal insufficiency     09/2011. not on ACEI secondary to this (also has renal artery stenosis)  . Peripheral vascular disease     severe right renal artery stenosis and left SFA stenosis by PV angio 09/2011, treated medically  . Cellulitis   . CAD (coronary artery disease)     NSTEMI 09/2011 felt poor candidate for CABG, instead  s/p PTCA/DES to mid RCA 10/17/11  . Chronic respiratory failure   . Pulmonary hypertension   . Obstructive sleep apnea   . CKD (chronic kidney disease) stage 2, GFR 60-89 ml/min 07/11/2013  . Vitamin D deficiency     Past Surgical History  Procedure Laterality Date  . Acromio-clavicular joint repair  2011  . Abdominal hysterectomy    . Eye surgery      cataract  OD  . Lower extremity angiogram N/A 10/11/2011    Procedure: LOWER EXTREMITY ANGIOGRAM;  Surgeon:  Sherren Mocha, MD;  Location: [facility] CATH LAB;  Service: Cardiovascular;  Laterality: N/A;  . Left and right heart catheterization with coronary angiogram N/A 10/11/2011    Procedure: LEFT AND RIGHT HEART CATHETERIZATION WITH CORONARY ANGIOGRAM;  Surgeon: Sherren Mocha, MD;  Location: [facility] CATH LAB;  Service: Cardiovascular;  Laterality: N/A;  . Percutaneous coronary stent intervention (pci-s) N/A 10/17/2011    Procedure: PERCUTANEOUS CORONARY STENT INTERVENTION (PCI-S);  Surgeon: Burnell Blanks, MD;  Location: [facility] CATH LAB;  Service: Cardiovascular;  Laterality: N/A;    Social History   Social History  . Marital Status: Divorced    Spouse Name: N/A  . Number of Children: N/A  . Years of Education: N/A   Occupational History  . Not on file.   Social History Main Topics  . Smoking status: Never Smoker   . Smokeless tobacco: Never Used  . Alcohol Use: No  . Drug Use: No  . Sexual Activity: No   Other Topics Concern  . Not on file   Social History Narrative     Filed Vitals:   06/03/15 1413  BP: 120/50  Pulse: 86  Height: 5\' 6"  (1.676 m)  SpO2: 99%    PHYSICAL EXAM General: NAD Neck: No JVD, no thyromegaly.  Lungs: Clear to auscultation bilaterally, no wheezes or rales. CV: Regular rate and rhythm, normal S1/S2, no S3/S4, soft I/VI systolic murmur along left sternal border. Mild left leg swelling, non pitting.  Abdomen: Soft, obese, nontender,no distention.  Neurologic: Alert. Psych: Normal affect.    ECG: Most recent ECG reviewed.      ASSESSMENT AND PLAN: 1. Chronic diastolic heart failure: Compensated and euvolemic. Continue Lasix, along with hydralazine and spironolactone. Echo in 11/15 showed normal RV size and function with mild pulmonary hypertension.  2. Malignant hypertension: Well controlled today. She is not on an ACEI reportedly because of severe right renal artery stenosis. Continue amlodipine 10 mg , hydralazine 50 mg two times a day, chlorthalidone 25 mg, and spironolactone 25 mg.  3. CAD: Symptomatically stable. Resting nuclear images in 06/2014 did not show ischemia, and showed thinning in the basal anterolateral wall and inferolateral wall.  Has h/o PCI to RCA in 2013, with cath demonstrating 2-vessel disease in the RCA and with severe left circumflex disease (serial 90-95% mid LCx lesions by cath on 10-11-2011). Repeat nuclear myocardial perfusion study stress was normal in November of 2014. Continue ASA, Ranexa, and atorvastatin. Not on beta blocker due to bradycardia.  4. OSA: Known to be noncompliant with CPAP lately.   5. PVD: Severe right renal artery stenosis and underwent directional atherectomy and percutaneous intervention for left SFA critical stenosis in 01/2015.   6. Hyperlipidemia: On Lipitor 20 mg daily.   7. Pulmonary: Follows up with pulmonary. Echo in 11/15 showed normal RV size and function with mild pulmonary hypertension.  Dispo: f/u 6 months.   Kate Sable, M.D., F.A.C.C.

## 2015-06-03 NOTE — Patient Instructions (Signed)
Your physician wants you to follow-up in: 6 months with Dr. Koneswaran. You will receive a reminder letter in the mail two months in advance. If you don't receive a letter, please call our office to schedule the follow-up appointment.  Your physician recommends that you continue on your current medications as directed. Please refer to the Current Medication list given to you today.  Thank you for choosing [facility]!   

## 2015-06-25 ENCOUNTER — Telehealth: Payer: Self-pay | Admitting: Cardiovascular Disease

## 2015-06-25 NOTE — Telephone Encounter (Signed)
I spoke with son as he said he was the one who called.Pt will now take hydralazine at 4 pm rather tham 8 pm at night to see if this helps pt

## 2015-06-25 NOTE — Telephone Encounter (Signed)
Patient feels her fluid pill is keeping her up at night (hydralazine). She is wondering if she can stop taking it/if there's a different medication that she can try?

## 2015-07-07 ENCOUNTER — Telehealth: Payer: Self-pay | Admitting: Neurology

## 2015-07-07 MED ORDER — DONEPEZIL HCL 10 MG PO TABS: 10.0000 mg | ORAL_TABLET | Freq: Every day | ORAL | Status: DC

## 2015-07-07 NOTE — Telephone Encounter (Signed)
Aricept refill requested. Per last office note- patient to remain on medication. Refill approved and sent to patient's pharmacy.   

## 2015-07-07 NOTE — Telephone Encounter (Signed)
Pt/Daughter/Janice//called to request a refill of med//she wasn't sure of the name of med//call back @ [phone]

## 2015-07-13 ENCOUNTER — Encounter: Payer: Self-pay | Admitting: Neurology

## 2015-07-13 ENCOUNTER — Ambulatory Visit (INDEPENDENT_AMBULATORY_CARE_PROVIDER_SITE_OTHER): Payer: Medicare HMO | Admitting: Neurology

## 2015-07-13 ENCOUNTER — Telehealth: Payer: Self-pay | Admitting: Neurology

## 2015-07-13 VITALS — BP 130/88 | HR 83

## 2015-07-13 DIAGNOSIS — G20 Parkinson's disease: Secondary | ICD-10-CM

## 2015-07-13 DIAGNOSIS — G301 Alzheimer's disease with late onset: Secondary | ICD-10-CM | POA: Diagnosis not present

## 2015-07-13 DIAGNOSIS — G4700 Insomnia, unspecified: Secondary | ICD-10-CM

## 2015-07-13 DIAGNOSIS — F0280 Dementia in other diseases classified elsewhere without behavioral disturbance: Secondary | ICD-10-CM

## 2015-07-13 MED ORDER — CARBIDOPA-LEVODOPA 25-100 MG PO TABS: 1.0000 | ORAL_TABLET | Freq: Three times a day (TID) | ORAL | Status: DC

## 2015-07-13 NOTE — Progress Notes (Signed)
Subjective:   Theresa Mclean was seen in consultation in the movement disorder clinic at the request of [name],[name], MD.  The evaluation is for tremor.  This patient is accompanied in the office by her child (son) who supplements the history.   The patient is a 81 y.o. right handed female with a history of tremor.  Pt states that it started in the last 1-2 months.  It seems to come and goes.  She seems to have it some days and not others.  It is most noticeable when the hands are not in use (at rest).  The L is most obvious and she doesn't notice the right much.  She can try and hold the arm and stop it for a little while. Her hydralazine was increased in November but she isn't sure if that is related.  Tremor: Yes.   Voice: some softer Sleep:   Vivid Dreams:  No.  Acting out dreams:  No. Wet Pillows: No. Postural symptoms:  Yes.    Falls?  No. (states that uses WC because of "bad knees" and doesn't walk now) Bradykinesia symptoms: slow movements, difficulty getting out of a chair and difficulty regaining balance Loss of smell:  Yes.   Loss of taste:  No. Urinary Incontinence:  No. Difficulty Swallowing:  No. (was troublesome but better now) Handwriting, micrographia: had accident in 1991 and hurt hand and hasn't written well since Trouble with ADL's:  Yes.  (needs assist)  Trouble buttoning clothing: Yes.   Depression:  No Memory changes:  Yes.   Hallucinations:  No.  visual distortions: rare N/V:  No. Lightheaded:  Yes.    Syncope: No. Diplopia:  Yes.   (told due to cataract)  09/17/14 update:  The patient is following up sooner than expected, accompanied by her son who supplements the history.  Patient has history of parkinsonian tremor, but no evidence of Parkinson's disease.  Because of horizontal nystagmus, I did order an MRI of the brain.  There was evidence of mild to moderate atrophy and mild to moderate small vessel disease.  I reviewed this with the patient and her  family today.  The patient's son stated that he wanted to talk to me today about memory changes.  The patient admits to memory changes, although she cannot give me specific examples.  The patient's son stated that she forgot her Social Security number on one occasion, which is very unusual.  In addition, she'll sometimes confuse her sons.  Recently, she confused a $1 bill for a $20 bill.  01/12/15 update:  The patient is following up today, accompanied by her son [name]), daughter and a caregiver, who supplement the history.  Records were reviewed since last visit as well.  The patient has a history of parkinsonian tremor, without other features of Parkinson's disease.  According to her son, her tremor has not worsened.  It tends to come and go.  Last visit, we talked extensively about dementia but she had refused any medication for that.  Today, however, she does return on Aricept, 5 mg daily.  Her daughter states that it was just started last Thursday by her primary care physician.  She states that the primary care physician was concerned and was wondering if we could "the appropriate memory testing and if mini strokes were ruled out."  The patient has tolerated Aricept well since last week.  She was having some hallucinations, both daytime and nighttime and having difficulty sleeping.  She was placed  on Ambien last week and the sleep got better and the hallucinations also got better.  She is not walking at all and even for transfers, her son has to lift her onto the bed or onto the toilet seat.  The patient does have an appointment with [facility] gastroenterology next week, as she has been throwing up after she eats.  07/13/15 update:  The patient follows up today, accompanied by her son, daughter, granddaughter and another son is on the phone who supplements the history.  I also reviewed records available to me.  Last visit, I increased her donepezil to 10 mg daily.  Her family reports that she is off the  medication because they think that the aricept was d/c in the hospital/SNF and they had just realized it.   Her daughter thinks that she has probably been off of it for 3-4 months.  She does have a Parkinsonian tremor, but has not had evidence of Parkinson's disease; she is on no medication for that.  She has been seeing interventional radiology regarding a foot wound that was not healing.  She had percutaneous intervention for critical left SFA stenosis.  Shortly after that, she was hospitalized at [facility] for a syncopal episode, associated with hypoxia and systolic blood pressure approximately 88.  She was found to be anemic and 2 units of packed red blood cells were given.  She does have 24 hour per day care in the home.  Her granddaughter is now much more involved with her care.  She reports that the patient is not aggressive or agitated ever, but confusion seems to wax and wane.  On "good days" she will nap about 30 minutes.  On bad days she may nap for an hour and a half and nighttime sleep is worse.  She gets up many times to use the restroom.  Her last of liquid at night is approximately 8 PM and that is a small amount to take her medications.  The patient is not doing anything physical activity wise at home, nor is she engaging in mental activities.  She was supposed to do home physical therapy in the past but apparently refused.  Outside reports reviewed: historical medical records, radiology reports and referral letter/letters.  Allergies  Allergen Reactions  . Ivp Dye [Iodinated Diagnostic Agents] Anaphylaxis  . Rocephin [Ceftriaxone] Diarrhea and Nausea And Vomiting    Outpatient Encounter Prescriptions as of 07/13/2015  Medication Sig  . amLODipine (NORVASC) 10 MG tablet Take 1 tablet (10 mg total) by mouth daily.  Marland Kitchen aspirin 325 MG tablet Take 325 mg by mouth daily.  Marland Kitchen atorvastatin (LIPITOR) 20 MG tablet Take 1 tablet (20 mg total) by mouth at bedtime.  . citalopram (CELEXA) 10 MG  tablet Take 10 mg by mouth daily.  . feeding supplement, GLUCERNA SHAKE, (GLUCERNA SHAKE) LIQD Take 237 mLs by mouth daily after supper.  . folic acid (FOLVITE) 1 MG tablet Take 1 tablet (1 mg total) by mouth daily.  Marland Kitchen gabapentin (NEURONTIN) 100 MG capsule Take 2 capsules (200 mg total) by mouth 2 (two) times daily. (Patient taking differently: Take 100 mg by mouth 2 (two) times daily. )  . hydrALAZINE (APRESOLINE) 50 MG tablet Take 1 tablet (50 mg total) by mouth 2 (two) times daily.  . montelukast (SINGULAIR) 10 MG tablet Take 10 mg by mouth daily.   . pantoprazole (PROTONIX) 40 MG tablet Take 40 mg by mouth daily.   . polyethylene glycol (MIRALAX / GLYCOLAX) packet Take 17 g  by mouth daily. (Patient taking differently: Take 17 g by mouth daily as needed (constipation). )  . spironolactone (ALDACTONE) 25 MG tablet Take 1 tablet (25 mg total) by mouth daily.  . carbidopa-levodopa (SINEMET IR) 25-100 MG tablet Take 1 tablet by mouth 3 (three) times daily.  . furosemide (LASIX) 40 MG tablet Take 1-3 tablets (40-120 mg total) by mouth daily. Based on degree of swelling/fluid buildup (Patient not taking: Reported on 07/13/2015)  . nitroGLYCERIN (NITROSTAT) 0.4 MG SL tablet Place 1 tablet (0.4 mg total) under the tongue every 5 (five) minutes as needed for chest pain. (Patient not taking: Reported on 07/13/2015)  . [DISCONTINUED] acitretin (SORIATANE) 10 MG capsule Take 10 mg by mouth daily with supper.  . [DISCONTINUED] albuterol (PROVENTIL HFA;VENTOLIN HFA) 108 (90 BASE) MCG/ACT inhaler Inhale 2 puffs into the lungs every 6 (six) hours as needed for wheezing or shortness of breath.  . [DISCONTINUED] benzonatate (TESSALON) 100 MG capsule Take 100 mg by mouth 3 (three) times daily as needed for cough.  . [DISCONTINUED] chlorthalidone (HYGROTON) 25 MG tablet Take 1 tablet (25 mg total) by mouth daily.  . [DISCONTINUED] clobetasol (OLUX) 0.05 % topical foam Apply 1 application topically daily as needed  (psoriasis).   . [DISCONTINUED] donepezil (ARICEPT) 10 MG tablet Take 1 tablet (10 mg total) by mouth daily.  . [DISCONTINUED] fexofenadine (ALLEGRA) 180 MG tablet Take 180 mg by mouth at bedtime.   . [DISCONTINUED] HYDROcodone-acetaminophen (NORCO) 7.5-325 MG per tablet Take 1 tablet by mouth every 6 (six) hours as needed (pain).   . [DISCONTINUED] insulin aspart (NOVOLOG FLEXPEN) 100 UNIT/ML FlexPen Inject 0-10 Units into the skin 3 (three) times daily with meals. Per sliding scale:  CBG 0-150 0 units, 151-200 2 units, 201-250 4 units, 251-300 6 nits, 301-350 8 units, 351-400 10 units, >400 call MD  . [DISCONTINUED] insulin aspart (NOVOLOG) 100 UNIT/ML injection SLIDING SCALE 0-9 Units, Injected subcutaneous, 3 times daily with meals. CBG < 70: implement hypoglycemia protocol CBG 70 - 120: 0 units CBG 121 - 150: 1 unit CBG 151 - 200: 2 units CBG 201 - 250: 3 units CBG 251 - 300: 5 units CBG 301 - 350: 7 units CBG 351 - 400: 9 units CBG > 400: call MD  . [DISCONTINUED] insulin glargine (LANTUS) 100 unit/mL SOPN Inject 60 Units into the skin 2 (two) times daily.  . [DISCONTINUED] iron polysaccharides (IFEREX 150) 150 MG capsule Take 150 mg by mouth daily.  . [DISCONTINUED] Nutritional Supplements (ENSURE PO) Take 237 mLs by mouth See admin instructions. Take once or twice daily as a meal supplement  . [DISCONTINUED] ranolazine (RANEXA) 500 MG 12 hr tablet Take 1 tablet (500 mg total) by mouth 2 (two) times daily.  . [DISCONTINUED] silver sulfADIAZINE (SILVADENE) 1 % cream Apply 1 application topically daily.  . [DISCONTINUED] zolpidem (AMBIEN) 5 MG tablet Take 5 mg by mouth at bedtime.    No facility-administered encounter medications on file as of 07/13/2015.    Past Medical History  Diagnosis Date  . Hypertension   . Nephrolithiasis   . GERD (gastroesophageal reflux disease)   . Arthritis   . Psoriasis   . Morbid obesity   . Mitral regurgitation   . Hyperlipidemia   . Sleep apnea      uses cpap  . CHF (congestive heart failure)     Diastolic. EF 55-65% by cath 10/11/11  . Diabetes mellitus     insulin dependent  . Acute renal insufficiency  09/2011. not on ACEI secondary to this (also has renal artery stenosis)  . Peripheral vascular disease     severe right renal artery stenosis and left SFA stenosis by PV angio 09/2011, treated medically  . Cellulitis   . CAD (coronary artery disease)     NSTEMI 09/2011 felt poor candidate for CABG, instead s/p PTCA/DES to mid RCA 10/17/11  . Chronic respiratory failure   . Pulmonary hypertension   . Obstructive sleep apnea   . CKD (chronic kidney disease) stage 2, GFR 60-89 ml/min 07/11/2013  . Vitamin D deficiency     Past Surgical History  Procedure Laterality Date  . Acromio-clavicular joint repair  2011  . Abdominal hysterectomy    . Eye surgery      cataract  OD  . Lower extremity angiogram N/A 10/11/2011    Procedure: LOWER EXTREMITY ANGIOGRAM;  Surgeon: Sherren Mocha, MD;  Location[facility] C[facility] LAB;  Service: Cardiovascular;  Laterality: N/A;  . Left and right heart catheterization with coronary angiogram N/A 10/11/2011    Procedure: LEFT AND RIGHT HEART CATHETERIZATION WITH CORONARY ANGIOGRAM;  Surgeon: Sherren Mocha, MD;  Location: [facility] CATH LAB;  Service: Cardiovascular;  Laterality: N/A;  . Percutaneous coronary stent intervention (pci-s) N/A 10/17/2011    Procedure: PERCUTANEOUS CORONARY STENT INTERVENTION (PCI-S);  Surgeon: Burnell Blanks, MD;  Location: [facility] CATH LAB;  Service: Cardiovascular;  Laterality: N/A;    Social History   Social History  . Marital Status: Divorced    Spouse Name: N/A  . Number of Children: N/A  . Years of Education: N/A   Occupational History  . Not on file.   Social History Main Topics  . Smoking status: Never Smoker   . Smokeless tobacco: Never Used  . Alcohol Use: No  . Drug Use: No  . Sexual Activity: No   Other Topics Concern  . Not on file   Social History  Narrative    Family Status  Relation Status Death Age  . Mother Deceased     Cancer  . Father Deceased     Cancer  . Brother Alive   . Brother Alive   . Brother Alive   . Brother Deceased     accident (horse)  . Sister Alive   . Sister Alive   . Sister Alive   . Sister Alive   . Sister Alive   . Sister Alive   . Sister Alive   . Sister Alive   . Sister Alive   . Sister Deceased     unknown  . Son Alive   . Son Alive   . Son Alive   . Daughter Alive     Review of Systems Limited due to memory change.   Objective:   VITALS:   Filed Vitals:   07/13/15 1353  BP: 130/88  Pulse: 83   Gen:  Appears stated age and in NAD. CV:  RRR Lungs:  CTAB Neck:  No bruits   NEUROLOGICAL:  Orientation:    Oriented to person, not place/time.  She did know the name of her granddaughter today, which apparently is remarkable Montreal Cognitive Assessment  09/17/2014  Visuospatial/ Executive (0/5) 1  Naming (0/3) 1  Attention: Read list of digits (0/2) 0  Attention: Read list of letters (0/1) 0  Attention: Serial 7 subtraction starting at 100 (0/3) 0  Language: Repeat phrase (0/2) 2  Language : Fluency (0/1) 0  Abstraction (0/2) 0  Delayed Recall (0/5) 0  Orientation (0/6) 5  Total 9  Adjusted Score (based on education) 10   cranial nerves: There is good facial symmetry.  Extraocular muscles are intact.  There is very little spontaneity of speech, but what she does say is very hypophonic but fluent. Tone: There appears to be increased tone in the right upper extremity.  She has gegenhalten so it is difficult to examine. Coordination: There is decremation bilaterally with finger taps. Motor: Strength is at least antigravity 4.  Does not participate with manual muscle testing. Sensation: Intact to light touch throughout.  Does not participate with more sensitive aspects. Gait and Station: She was a 2 person assist to get her out of the wheelchair.  She was given a walker.   She is very short stepped and shuffles and after about 6 steps states that she cannot go any farther and her knees hurt and requests to sit. MOVEMENT EXAM: Tremor:  There is a rare tremor today, and only in the upper extremities.  Labs:  Lab Results  Component Value Date   TSH 3.435 08/28/2014     Chemistry      Component Value Date/Time   NA 134* 02/16/2015 0749   K 2.7* 02/16/2015 0749   CL 97* 02/16/2015 0749   CO2 27 02/16/2015 0749   BUN <5* 02/16/2015 0749   CREATININE 0.80 02/16/2015 0749   CREATININE 1.37* 01/13/2015 1325      Component Value Date/Time   CALCIUM 9.3 02/16/2015 0749   ALKPHOS 67 11/04/2014 2035   AST 31 11/04/2014 2035   ALT 38* 11/04/2014 2035   BILITOT 0.6 11/04/2014 2035     Lab Results  Component Value Date   WBC 8.9 02/16/2015   HGB 12.6 02/16/2015   HCT 37.7 02/16/2015   MCV 77.9* 02/16/2015   PLT 272 02/16/2015        Assessment/Plan:   1.  Parkinsonian tremor  -The patient definitely looks more parkinsonian today than she has in the past.  With the family's permission, we will try to start carbidopa/levodopa 25/100 and work up to one tablet 3 times per day.  They're to call me and let me know about any side effects, especially hallucinations.  Risks, benefits, side effects and alternative therapies were discussed.  The opportunity to ask questions was given and they were answered to the best of my ability.  The patient expressed understanding and willingness to follow the outlined treatment protocols.  -We'll refer for home physical therapy and occupational therapy. 2.  MRI with cerebral small vessel disease and atrophy  -Multiple risk factors for this including hypertension, hyperlipidemia, diabetes mellitus and peripheral vascular disease.  I reviewed this with her daughter today as she had not previously been here in 3.  Memory changes, likely Alzheimer's dementia.  -Long discussion with patient and her family.  Apparently, her  Aricept was inadvertently stopped when she was in the hospital/subacute nursing facility.  I was going to restart it today, but since I am starting levodopa decided to hold off on that.  We will readdress next visit.  -I talked to the patient and her family again about making sure that she is not sitting around all day, which it appears that she is currently.  I gave her daughter several activities that she could participate in.  -off ambien, which I agree with, but is likely part of the reason she is not sleeping as well as night.  The bigger reason is likely day/night reversal.  Talked to her about limiting the  naps during the daytime and instead keeping her engaged. 4.  Plan to see her back in 6 weeks.  Much greater than 50% of this visit was spent in counseling with the patient and the family.  Total face to face time:  40 min

## 2015-07-13 NOTE — Telephone Encounter (Signed)
Referral to [facility] for [facility] PT/OT faxed to [phone] with confirmation received. They will contact patient.  

## 2015-07-13 NOTE — Patient Instructions (Signed)
1. Start Carbidopa Levodopa as follows: 1/2 tab three times a day before meals x 1 wk, then 1/2 in am & noon & 1 in evening for a week, then 1/2 in am &1 at noon &one in evening for a week, then 1 tablet three times a day before meals. 2. Hold Aricept 3. We have referred you to [facility] for physical therapy. They will contact you directly to set up care. They can be reached at [phone]. 4. Follow up 4-6 weeks.

## 2015-07-15 ENCOUNTER — Telehealth: Payer: Self-pay | Admitting: Neurology

## 2015-07-15 ENCOUNTER — Ambulatory Visit: Payer: Medicare Other | Admitting: Neurology

## 2015-07-15 NOTE — Telephone Encounter (Signed)
[facility] called to add PT/OT to their services instead of [facility] since patient lives in [place] and already getting nursing care through company. Order faxed to [facility] at [phone] with confirmation received. They will call with any questions.

## 2015-08-11 ENCOUNTER — Ambulatory Visit: Payer: PRIVATE HEALTH INSURANCE | Admitting: Neurology

## 2015-09-04 DIAGNOSIS — Z418 Encounter for other procedures for purposes other than remedying health state: Secondary | ICD-10-CM | POA: Diagnosis not present

## 2015-09-04 DIAGNOSIS — E1151 Type 2 diabetes mellitus with diabetic peripheral angiopathy without gangrene: Secondary | ICD-10-CM | POA: Diagnosis not present

## 2015-09-04 DIAGNOSIS — I509 Heart failure, unspecified: Secondary | ICD-10-CM | POA: Diagnosis not present

## 2015-09-08 ENCOUNTER — Encounter: Payer: Self-pay | Admitting: Neurology

## 2015-09-08 ENCOUNTER — Ambulatory Visit (INDEPENDENT_AMBULATORY_CARE_PROVIDER_SITE_OTHER): Payer: Medicare Other | Admitting: Neurology

## 2015-09-08 VITALS — BP 124/62 | HR 64

## 2015-09-08 DIAGNOSIS — G20 Parkinson's disease: Secondary | ICD-10-CM | POA: Diagnosis not present

## 2015-09-08 DIAGNOSIS — G309 Alzheimer's disease, unspecified: Secondary | ICD-10-CM | POA: Diagnosis not present

## 2015-09-08 DIAGNOSIS — F0280 Dementia in other diseases classified elsewhere without behavioral disturbance: Secondary | ICD-10-CM

## 2015-09-08 MED ORDER — CARBIDOPA-LEVODOPA 25-100 MG PO TABS: ORAL_TABLET | ORAL | Status: AC

## 2015-09-08 NOTE — Patient Instructions (Signed)
Take Carbidopa Levodopa 1 tablet at 8 am, 1 tablet at 12 pm, 1 tablet at 5 pm.

## 2015-09-08 NOTE — Progress Notes (Signed)
Subjective:   Theresa Mclean was seen in consultation in the movement disorder clinic at the request of [name],[name], MD.  The evaluation is for tremor.  This patient is accompanied in the office by her child (son) who supplements the history.   The patient is a 82 y.o. right handed female with a history of tremor.  Pt states that it started in the last 1-2 months.  It seems to come and goes.  She seems to have it some days and not others.  It is most noticeable when the hands are not in use (at rest).  The L is most obvious and she doesn't notice the right much.  She can try and hold the arm and stop it for a little while. Her hydralazine was increased in November but she isn't sure if that is related.  Tremor: Yes.   Voice: some softer Sleep:   Vivid Dreams:  No.  Acting out dreams:  No. Wet Pillows: No. Postural symptoms:  Yes.    Falls?  No. (states that uses WC because of "bad knees" and doesn't walk now) Bradykinesia symptoms: slow movements, difficulty getting out of a chair and difficulty regaining balance Loss of smell:  Yes.   Loss of taste:  No. Urinary Incontinence:  No. Difficulty Swallowing:  No. (was troublesome but better now) Handwriting, micrographia: had accident in 1991 and hurt hand and hasn't written well since Trouble with ADL's:  Yes.  (needs assist)  Trouble buttoning clothing: Yes.   Depression:  No Memory changes:  Yes.   Hallucinations:  No.  visual distortions: rare N/V:  No. Lightheaded:  Yes.    Syncope: No. Diplopia:  Yes.   (told due to cataract)  09/17/14 update:  The patient is following up sooner than expected, accompanied by her son who supplements the history.  Patient has history of parkinsonian tremor, but no evidence of Parkinson's disease.  Because of horizontal nystagmus, I did order an MRI of the brain.  There was evidence of mild to moderate atrophy and mild to moderate small vessel disease.  I reviewed this with the patient and her  family today.  The patient's son stated that he wanted to talk to me today about memory changes.  The patient admits to memory changes, although she cannot give me specific examples.  The patient's son stated that she forgot her Social Security number on one occasion, which is very unusual.  In addition, she'll sometimes confuse her sons.  Recently, she confused a $1 bill for a $20 bill.  01/12/15 update:  The patient is following up today, accompanied by her son [name]), daughter and a caregiver, who supplement the history.  Records were reviewed since last visit as well.  The patient has a history of parkinsonian tremor, without other features of Parkinson's disease.  According to her son, her tremor has not worsened.  It tends to come and go.  Last visit, we talked extensively about dementia but she had refused any medication for that.  Today, however, she does return on Aricept, 5 mg daily.  Her daughter states that it was just started last Thursday by her primary care physician.  She states that the primary care physician was concerned and was wondering if we could "the appropriate memory testing and if mini strokes were ruled out."  The patient has tolerated Aricept well since last week.  She was having some hallucinations, both daytime and nighttime and having difficulty sleeping.  She was placed  on Ambien last week and the sleep got better and the hallucinations also got better.  She is not walking at all and even for transfers, her son has to lift her onto the bed or onto the toilet seat.  The patient does have an appointment with [facility] gastroenterology next week, as she has been throwing up after she eats.  07/13/15 update:  The patient follows up today, accompanied by her son, daughter, granddaughter and another son is on the phone who supplements the history.  I also reviewed records available to me.  Last visit, I increased her donepezil to 10 mg daily.  Her family reports that she is off the  medication because they think that the aricept was d/c in the hospital/SNF and they had just realized it.   Her daughter thinks that she has probably been off of it for 3-4 months.  She does have a Parkinsonian tremor, but has not had evidence of Parkinson's disease; she is on no medication for that.  She has been seeing interventional radiology regarding a foot wound that was not healing.  She had percutaneous intervention for critical left SFA stenosis.  Shortly after that, she was hospitalized at [facility] for a syncopal episode, associated with hypoxia and systolic blood pressure approximately 88.  She was found to be anemic and 2 units of packed red blood cells were given.  She does have 24 hour per day care in the home.  Her granddaughter is now much more involved with her care.  She reports that the patient is not aggressive or agitated ever, but confusion seems to wax and wane.  On "good days" she will nap about 30 minutes.  On bad days she may nap for an hour and a half and nighttime sleep is worse.  She gets up many times to use the restroom.  Her last of liquid at night is approximately 8 PM and that is a small amount to take her medications.  The patient is not doing anything physical activity wise at home, nor is she engaging in mental activities.  She was supposed to do home physical therapy in the past but apparently refused.  09/08/15 update:  The patient is following up today, accompanied by her family (son, daughter and another daughter is on phone) who supplements the history.  We gave the patient a trial of levodopa last visit (8am/2pm/8pm).  Today, her family states that she is doing better; she is no longer having tremor.  She can walk very little because of having an ulcer on the foot.  However, she is walking better than previously.  She completed PT on 12/30.   No hallucinations.   She was off of Aricept last visit as it was accidentally discontinued while she was at the hospital.  We  decided to hold that since we were just starting levodopa and did not want to start 2 medications at once.  However, she returns today back on the aricept. Her family is providing 24/7 care and pharmacy is distributing medication.   Outside reports reviewed: historical medical records, radiology reports and referral letter/letters.  Allergies  Allergen Reactions  . Ivp Dye [Iodinated Diagnostic Agents] Anaphylaxis  . Rocephin [Ceftriaxone] Diarrhea and Nausea And Vomiting    Outpatient Encounter Prescriptions as of 09/08/2015  Medication Sig  . amLODipine (NORVASC) 10 MG tablet Take 1 tablet (10 mg total) by mouth daily.  Marland Kitchen aspirin 325 MG tablet Take 325 mg by mouth daily.  Marland Kitchen atorvastatin (LIPITOR) 20  MG tablet Take 1 tablet (20 mg total) by mouth at bedtime.  . carbidopa-levodopa (SINEMET IR) 25-100 MG tablet Take 1 tablet by mouth 3 (three) times daily.  . citalopram (CELEXA) 10 MG tablet Take 10 mg by mouth daily.  . feeding supplement, GLUCERNA SHAKE, (GLUCERNA SHAKE) LIQD Take 237 mLs by mouth daily after supper.  . folic acid (FOLVITE) 1 MG tablet Take 1 tablet (1 mg total) by mouth daily.  . furosemide (LASIX) 40 MG tablet Take 1-3 tablets (40-120 mg total) by mouth daily. Based on degree of swelling/fluid buildup (Patient not taking: Reported on 07/13/2015)  . gabapentin (NEURONTIN) 100 MG capsule Take 2 capsules (200 mg total) by mouth 2 (two) times daily. (Patient taking differently: Take 100 mg by mouth 2 (two) times daily. )  . hydrALAZINE (APRESOLINE) 50 MG tablet Take 1 tablet (50 mg total) by mouth 2 (two) times daily.  . montelukast (SINGULAIR) 10 MG tablet Take 10 mg by mouth daily.   . nitroGLYCERIN (NITROSTAT) 0.4 MG SL tablet Place 1 tablet (0.4 mg total) under the tongue every 5 (five) minutes as needed for chest pain. (Patient not taking: Reported on 07/13/2015)  . pantoprazole (PROTONIX) 40 MG tablet Take 40 mg by mouth daily.   . polyethylene glycol (MIRALAX /  GLYCOLAX) packet Take 17 g by mouth daily. (Patient taking differently: Take 17 g by mouth daily as needed (constipation). )  . spironolactone (ALDACTONE) 25 MG tablet Take 1 tablet (25 mg total) by mouth daily.   No facility-administered encounter medications on file as of 09/08/2015.    Past Medical History  Diagnosis Date  . Hypertension   . Nephrolithiasis   . GERD (gastroesophageal reflux disease)   . Arthritis   . Psoriasis   . Morbid obesity ([place])   . Mitral regurgitation   . Hyperlipidemia   . Sleep apnea     uses cpap  . CHF (congestive heart failure) (HCC)     Diastolic. EF 55-65% by cath 10/11/11  . Diabetes mellitus     insulin dependent  . Acute renal insufficiency     09/2011. not on ACEI secondary to this (also has renal artery stenosis)  . Peripheral vascular disease ([place])     severe right renal artery stenosis and left SFA stenosis by PV angio 09/2011, treated medically  . Cellulitis   . CAD (coronary artery disease)     NSTEMI 09/2011 felt poor candidate for CABG, instead s/p PTCA/DES to mid RCA 10/17/11  . Chronic respiratory failure ([place])   . Pulmonary hypertension ([place])   . Obstructive sleep apnea   . CKD (chronic kidney disease) stage 2, GFR 60-89 ml/min 07/11/2013  . Vitamin D deficiency     Past Surgical History  Procedure Laterality Date  . Acromio-clavicular joint repair  2011  . Abdominal hysterectomy    . Eye surgery      cataract  OD  . Lower extremity angiogram N/A 10/11/2011    Procedure: LOWER EXTREMITY ANGIOGRAM;  Surgeon: Sherren Mocha, MD;  Location: [facility] CATH LAB;  Service: Cardiovascular;  Laterality: N/A;  . Left and right heart catheterization with coronary angiogram N/A 10/11/2011    Procedure: LEFT AND RIGHT HEART CATHETERIZATION WITH CORONARY ANGIOGRAM;  Surgeon: Sherren Mocha, MD;  Location: [facility] CATH LAB;  Service: Cardiovascular;  Laterality: N/A;  . Percutaneous coronary stent intervention (pci-s) N/A 10/17/2011    Procedure:  PERCUTANEOUS CORONARY STENT INTERVENTION (PCI-S);  Surgeon: Burnell Blanks, MD;  Location: [facility] CATH LAB;  Service: Cardiovascular;  Laterality: N/A;    Social History   Social History  . Marital Status: Divorced    Spouse Name: N/A  . Number of Children: N/A  . Years of Education: N/A   Occupational History  . Not on file.   Social History Main Topics  . Smoking status: Never Smoker   . Smokeless tobacco: Never Used  . Alcohol Use: No  . Drug Use: No  . Sexual Activity: No   Other Topics Concern  . Not on file   Social History Narrative    Family Status  Relation Status Death Age  . Mother Deceased     Cancer  . Father Deceased     Cancer  . Brother Alive   . Brother Alive   . Brother Alive   . Brother Deceased     accident (horse)  . Sister Alive   . Sister Alive   . Sister Alive   . Sister Alive   . Sister Alive   . Sister Alive   . Sister Alive   . Sister Alive   . Sister Alive   . Sister Deceased     unknown  . Son Alive   . Son Alive   . Son Alive   . Daughter Alive     Review of Systems Limited due to memory change.   Objective:   VITALS:   There were no vitals filed for this visit. Gen:  Appears stated age and in NAD. CV:  RRR Lungs:  CTAB Neck:  No bruits   NEUROLOGICAL:  Orientation:    Oriented to person, not place/time.   Montreal Cognitive Assessment  09/17/2014  Visuospatial/ Executive (0/5) 1  Naming (0/3) 1  Attention: Read list of digits (0/2) 0  Attention: Read list of letters (0/1) 0  Attention: Serial 7 subtraction starting at 100 (0/3) 0  Language: Repeat phrase (0/2) 2  Language : Fluency (0/1) 0  Abstraction (0/2) 0  Delayed Recall (0/5) 0  Orientation (0/6) 5  Total 9  Adjusted Score (based on education) 10   cranial nerves: There is good facial symmetry.  Extraocular muscles are intact.  There is very little spontaneity of speech, but what she does say is hypophonic but fluent. Tone: There appears to be  increased tone in the right upper extremity.  She has gegenhalten so it is difficult to examine. Coordination: There is decremation bilaterally with finger taps. Motor: Strength is at least antigravity 4.  Does not participate with manual muscle testing. Sensation: Intact to light touch throughout.  Does not participate with more sensitive aspects. Gait and Station: She was a 2 person assist to get her out of the wheelchair.  She was given a walker.  She was able to walk with the walker and made it out of the examination room and down the hall (despite the fact that she kept stating she couldn't do it and said, "Oh, Lord..."  She was not shuffling like last visit MOVEMENT EXAM: Tremor:  There is no tremor noted  Labs:  Lab Results  Component Value Date   TSH 3.435 08/28/2014     Chemistry      Component Value Date/Time   NA 134* 02/16/2015 0749   K 2.7* 02/16/2015 0749   CL 97* 02/16/2015 0749   CO2 27 02/16/2015 0749   BUN <5* 02/16/2015 0749   CREATININE 0.80 02/16/2015 0749   CREATININE 1.37* 01/13/2015 1325      Component Value Date/Time  CALCIUM 9.3 02/16/2015 0749   ALKPHOS 67 11/04/2014 2035   AST 31 11/04/2014 2035   ALT 38* 11/04/2014 2035   BILITOT 0.6 11/04/2014 2035     Lab Results  Component Value Date   WBC 8.9 02/16/2015   HGB 12.6 02/16/2015   HCT 37.7 02/16/2015   MCV 77.9* 02/16/2015   PLT 272 02/16/2015        Assessment/Plan:   1.  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  She has responded to levodopa and it looks better than last visit.  We will keep her on this.  She will stay on carbidopa/levodopa 25/100, one tablet 3 times per day; change timing to 8am/12pm/5pm.  She is walking better and has no tremor.  She has no hallucinations. 2.  MRI with cerebral small vessel disease and atrophy  -Multiple risk factors for this including hypertension, hyperlipidemia, diabetes mellitus and peripheral vascular disease.  I reviewed this with  her daughter today as she had not previously been here in 3.  Memory changes, likely Alzheimer's dementia although could be vascular.  -She is back on Aricept, 10 mg daily.  -Talked about the importance of trying to avoid day night reversal.  Talked about limiting naps. 4.  Plan to see her back in 3-4 months.  Much greater than 50% of this visit was spent in counseling with the patient and the family.  Total face to face time:  35 min

## 2015-09-14 DIAGNOSIS — Z452 Encounter for adjustment and management of vascular access device: Secondary | ICD-10-CM | POA: Diagnosis not present

## 2015-09-22 ENCOUNTER — Ambulatory Visit (INDEPENDENT_AMBULATORY_CARE_PROVIDER_SITE_OTHER): Payer: Medicare Other | Admitting: Cardiovascular Disease

## 2015-09-22 ENCOUNTER — Encounter: Payer: Self-pay | Admitting: Cardiovascular Disease

## 2015-09-22 VITALS — BP 112/50 | HR 68 | Ht 63.0 in | Wt 155.0 lb

## 2015-09-22 DIAGNOSIS — I5032 Chronic diastolic (congestive) heart failure: Secondary | ICD-10-CM

## 2015-09-22 DIAGNOSIS — E785 Hyperlipidemia, unspecified: Secondary | ICD-10-CM | POA: Diagnosis not present

## 2015-09-22 DIAGNOSIS — I2510 Atherosclerotic heart disease of native coronary artery without angina pectoris: Secondary | ICD-10-CM | POA: Diagnosis not present

## 2015-09-22 DIAGNOSIS — I10 Essential (primary) hypertension: Secondary | ICD-10-CM | POA: Diagnosis not present

## 2015-09-22 DIAGNOSIS — I739 Peripheral vascular disease, unspecified: Secondary | ICD-10-CM

## 2015-09-22 NOTE — Progress Notes (Signed)
Patient ID: Theresa Mclean, female   DOB: 1929-05-12, 82 y.o.   MRN: [id number]      SUBJECTIVE: The patient presents for routine follow-up for chronic diastolic heart failure, malignant hypertension, and coronary artery disease. Her blood pressure is well controlled. She denies chest pain and shortness of breath. She underwent directional atherectomy and percutaneous intervention for critical left SFA stenosis in June 2016.  She is here with her daughter and granddaughter. Blood pressures have been checked and remain well controlled at home. She has not had any leg swelling. She only takes Lasix as needed now. They were previously having troubles with her frequently urinating at night. They report she has a good appetite.    Review of Systems: As per "subjective", otherwise negative.  Allergies  Allergen Reactions  . Ivp Dye [Iodinated Diagnostic Agents] Anaphylaxis  . Rocephin [Ceftriaxone] Diarrhea and Nausea And Vomiting    Current Outpatient Prescriptions  Medication Sig Dispense Refill  . amLODipine (NORVASC) 10 MG tablet Take 1 tablet (10 mg total) by mouth daily. 30 tablet 6  . aspirin 325 MG tablet Take 325 mg by mouth daily.    Marland Kitchen atorvastatin (LIPITOR) 20 MG tablet Take 1 tablet (20 mg total) by mouth at bedtime. 30 tablet 6  . carbidopa-levodopa (SINEMET IR) 25-100 MG tablet 1 tablet at 8 am, 1 tablet at 12 pm, 1 tablet at 5 pm 90 tablet 5  . feeding supplement, GLUCERNA SHAKE, (GLUCERNA SHAKE) LIQD Take 237 mLs by mouth daily after supper. 12 Can 2  . folic acid (FOLVITE) 1 MG tablet Take 1 tablet (1 mg total) by mouth daily. 30 tablet 6  . gabapentin (NEURONTIN) 100 MG capsule Take 2 capsules (200 mg total) by mouth 2 (two) times daily. (Patient taking differently: Take 100 mg by mouth 2 (two) times daily. ) 120 capsule 0  . hydrALAZINE (APRESOLINE) 50 MG tablet Take 1 tablet (50 mg total) by mouth 2 (two) times daily. 60 tablet 6  . montelukast (SINGULAIR) 10 MG tablet  Take 10 mg by mouth daily.     . nitroGLYCERIN (NITROSTAT) 0.4 MG SL tablet Place 1 tablet (0.4 mg total) under the tongue every 5 (five) minutes as needed for chest pain. 25 tablet 3  . pantoprazole (PROTONIX) 40 MG tablet Take 40 mg by mouth daily.     . polyethylene glycol (MIRALAX / GLYCOLAX) packet Take 17 g by mouth daily. (Patient taking differently: Take 17 g by mouth daily as needed (constipation). ) 14 each 0  . sodium bicarbonate 325 MG tablet Take 325 mg by mouth 2 (two) times daily.    Marland Kitchen spironolactone (ALDACTONE) 25 MG tablet Take 1 tablet (25 mg total) by mouth daily. 30 tablet 6   No current facility-administered medications for this visit.    Past Medical History  Diagnosis Date  . Hypertension   . Nephrolithiasis   . GERD (gastroesophageal reflux disease)   . Arthritis   . Psoriasis   . Morbid obesity ([place])   . Mitral regurgitation   . Hyperlipidemia   . Sleep apnea     uses cpap  . CHF (congestive heart failure) (HCC)     Diastolic. EF 55-65% by cath 10/11/11  . Diabetes mellitus     insulin dependent  . Acute renal insufficiency     09/2011. not on ACEI secondary to this (also has renal artery stenosis)  . Peripheral vascular disease (HCC)     severe right renal artery stenosis and left  SFA stenosis by PV angio 09/2011, treated medically  . Cellulitis   . CAD (coronary artery disease)     NSTEMI 09/2011 felt poor candidate for CABG, instead s/p PTCA/DES to mid RCA 10/17/11  . Chronic respiratory failure ([place])   . Pulmonary hypertension ([place])   . Obstructive sleep apnea   . CKD (chronic kidney disease) stage 2, GFR 60-89 ml/min 07/11/2013  . Vitamin D deficiency     Past Surgical History  Procedure Laterality Date  . Acromio-clavicular joint repair  2011  . Abdominal hysterectomy    . Eye surgery      cataract  OD  . Lower extremity angiogram N/A 10/11/2011    Procedure: LOWER EXTREMITY ANGIOGRAM;  Surgeon: Sherren Mocha, MD;  Location: [facility] CATH LAB;   Service: Cardiovascular;  Laterality: N/A;  . Left and right heart catheterization with coronary angiogram N/A 10/11/2011    Procedure: LEFT AND RIGHT HEART CATHETERIZATION WITH CORONARY ANGIOGRAM;  Surgeon: Sherren Mocha, MD;  Location: [facility] CATH LAB;  Service: Cardiovascular;  Laterality: N/A;  . Percutaneous coronary stent intervention (pci-s) N/A 10/17/2011    Procedure: PERCUTANEOUS CORONARY STENT INTERVENTION (PCI-S);  Surgeon: Burnell Blanks, MD;  Location: [facility] CATH LAB;  Service: Cardiovascular;  Laterality: N/A;    Social History   Social History  . Marital Status: Divorced    Spouse Name: N/A  . Number of Children: N/A  . Years of Education: N/A   Occupational History  . Not on file.   Social History Main Topics  . Smoking status: Never Smoker   . Smokeless tobacco: Never Used  . Alcohol Use: No  . Drug Use: No  . Sexual Activity: No   Other Topics Concern  . Not on file   Social History Narrative     Filed Vitals:   09/22/15 1445  BP: 112/50  Pulse: 68  Height: 5\' 3"  (1.6 m)  Weight: 155 lb (70.308 kg)  SpO2: 98%    PHYSICAL EXAM General: NAD Neck: No JVD, no thyromegaly.  Lungs: Clear to auscultation bilaterally, no wheezes or rales. CV: Regular rate and rhythm, normal S1/S2, no S3/S4, soft I/VI systolic murmur along left sternal border. No led edema. Abdomen: Soft, obese, nontender,no distention.  Neurologic: Alert. Psych: Normal affect.   ECG: Most recent ECG reviewed.      ASSESSMENT AND PLAN: 1. Chronic diastolic heart failure: Compensated and euvolemic. Takes Lasix prn. Continue hydralazine and spironolactone. Echo in 11/15 showed normal RV size and function with mild pulmonary hypertension.  2. Malignant hypertension: Well controlled today. She is not on an ACEI reportedly because of severe right renal artery stenosis. Continue amlodipine 10 mg , hydralazine 50 mg two times a day, and spironolactone 25 mg. No longer on  chlorthalidone.  3. CAD: Symptomatically stable. Resting nuclear images in 06/2014 did not show ischemia, and showed thinning in the basal anterolateral wall and inferolateral wall.  Has h/o PCI to RCA in 2013, with cath demonstrating 2-vessel disease in the RCA and with severe left circumflex disease (serial 90-95% mid LCx lesions by cath on 10-11-2011). Repeat nuclear myocardial perfusion study stress was normal in November of 2014. Continue ASA and atorvastatin. No longer on Ranexa. Not on beta blocker due to bradycardia.  4. OSA: CPAP.  5. PVD: Severe right renal artery stenosis and underwent directional atherectomy and percutaneous intervention for left SFA critical stenosis in 01/2015.   6. Hyperlipidemia: On Lipitor 20 mg daily.   Dispo: f/u 6 months.  Kate Sable,  M.D., F.A.C.C.

## 2015-09-22 NOTE — Patient Instructions (Signed)
Continue all current medications. Your physician wants you to follow up in: 6 months.  You will receive a reminder letter in the mail one-two months in advance.  If you don't receive a letter, please call our office to schedule the follow up appointment   

## 2015-09-29 DIAGNOSIS — Z79899 Other long term (current) drug therapy: Secondary | ICD-10-CM | POA: Diagnosis not present

## 2015-09-29 DIAGNOSIS — N183 Chronic kidney disease, stage 3 (moderate): Secondary | ICD-10-CM | POA: Diagnosis not present

## 2015-09-29 DIAGNOSIS — E559 Vitamin D deficiency, unspecified: Secondary | ICD-10-CM | POA: Diagnosis not present

## 2015-09-29 DIAGNOSIS — R809 Proteinuria, unspecified: Secondary | ICD-10-CM | POA: Diagnosis not present

## 2015-09-29 DIAGNOSIS — I129 Hypertensive chronic kidney disease with stage 1 through stage 4 chronic kidney disease, or unspecified chronic kidney disease: Secondary | ICD-10-CM | POA: Diagnosis not present

## 2015-09-29 DIAGNOSIS — D509 Iron deficiency anemia, unspecified: Secondary | ICD-10-CM | POA: Diagnosis not present

## 2015-10-06 DIAGNOSIS — I509 Heart failure, unspecified: Secondary | ICD-10-CM | POA: Diagnosis not present

## 2015-10-06 DIAGNOSIS — R809 Proteinuria, unspecified: Secondary | ICD-10-CM | POA: Diagnosis not present

## 2015-10-06 DIAGNOSIS — D649 Anemia, unspecified: Secondary | ICD-10-CM | POA: Diagnosis not present

## 2015-10-06 DIAGNOSIS — I10 Essential (primary) hypertension: Secondary | ICD-10-CM | POA: Diagnosis not present

## 2015-10-06 DIAGNOSIS — N189 Chronic kidney disease, unspecified: Secondary | ICD-10-CM | POA: Diagnosis not present

## 2015-10-06 DIAGNOSIS — E1129 Type 2 diabetes mellitus with other diabetic kidney complication: Secondary | ICD-10-CM | POA: Diagnosis not present

## 2015-10-12 DIAGNOSIS — Z452 Encounter for adjustment and management of vascular access device: Secondary | ICD-10-CM | POA: Diagnosis not present

## 2015-11-02 DIAGNOSIS — E1142 Type 2 diabetes mellitus with diabetic polyneuropathy: Secondary | ICD-10-CM | POA: Diagnosis not present

## 2015-11-02 DIAGNOSIS — B351 Tinea unguium: Secondary | ICD-10-CM | POA: Diagnosis not present

## 2015-11-02 DIAGNOSIS — L84 Corns and callosities: Secondary | ICD-10-CM | POA: Diagnosis not present

## 2015-11-04 DIAGNOSIS — I509 Heart failure, unspecified: Secondary | ICD-10-CM | POA: Diagnosis not present

## 2015-11-04 DIAGNOSIS — E1151 Type 2 diabetes mellitus with diabetic peripheral angiopathy without gangrene: Secondary | ICD-10-CM | POA: Diagnosis not present

## 2015-11-04 DIAGNOSIS — I10 Essential (primary) hypertension: Secondary | ICD-10-CM | POA: Diagnosis not present

## 2015-11-04 DIAGNOSIS — E1142 Type 2 diabetes mellitus with diabetic polyneuropathy: Secondary | ICD-10-CM | POA: Diagnosis not present

## 2015-11-09 DIAGNOSIS — Z452 Encounter for adjustment and management of vascular access device: Secondary | ICD-10-CM | POA: Diagnosis not present

## 2015-11-17 DIAGNOSIS — J209 Acute bronchitis, unspecified: Secondary | ICD-10-CM | POA: Diagnosis not present

## 2015-11-17 DIAGNOSIS — I10 Essential (primary) hypertension: Secondary | ICD-10-CM | POA: Diagnosis not present

## 2015-11-17 DIAGNOSIS — E1142 Type 2 diabetes mellitus with diabetic polyneuropathy: Secondary | ICD-10-CM | POA: Diagnosis not present

## 2015-11-17 DIAGNOSIS — I509 Heart failure, unspecified: Secondary | ICD-10-CM | POA: Diagnosis not present

## 2015-11-25 ENCOUNTER — Other Ambulatory Visit: Payer: Self-pay | Admitting: Physician Assistant

## 2015-12-02 ENCOUNTER — Other Ambulatory Visit: Payer: Self-pay | Admitting: Physician Assistant

## 2015-12-07 DIAGNOSIS — Z452 Encounter for adjustment and management of vascular access device: Secondary | ICD-10-CM | POA: Diagnosis not present

## 2015-12-09 DIAGNOSIS — F0390 Unspecified dementia without behavioral disturbance: Secondary | ICD-10-CM | POA: Diagnosis not present

## 2015-12-09 DIAGNOSIS — E119 Type 2 diabetes mellitus without complications: Secondary | ICD-10-CM | POA: Diagnosis not present

## 2015-12-09 DIAGNOSIS — I509 Heart failure, unspecified: Secondary | ICD-10-CM | POA: Diagnosis not present

## 2015-12-09 DIAGNOSIS — I10 Essential (primary) hypertension: Secondary | ICD-10-CM | POA: Diagnosis not present

## 2015-12-13 IMAGING — CR DG ABDOMEN ACUTE W/ 1V CHEST
3 series · 3 of 3 positions shown · non-contrast
Comparison: Chest radiograph July 08, 2014 and July 09, 2013 ; CT abdomen and pelvis July 08, 2014

CLINICAL DATA: Difficulty breathing ; abdominal pain

EXAM:
ACUTE ABDOMEN SERIES (ABDOMEN 2 VIEW & CHEST 1 VIEW)

[w abdomen decub]
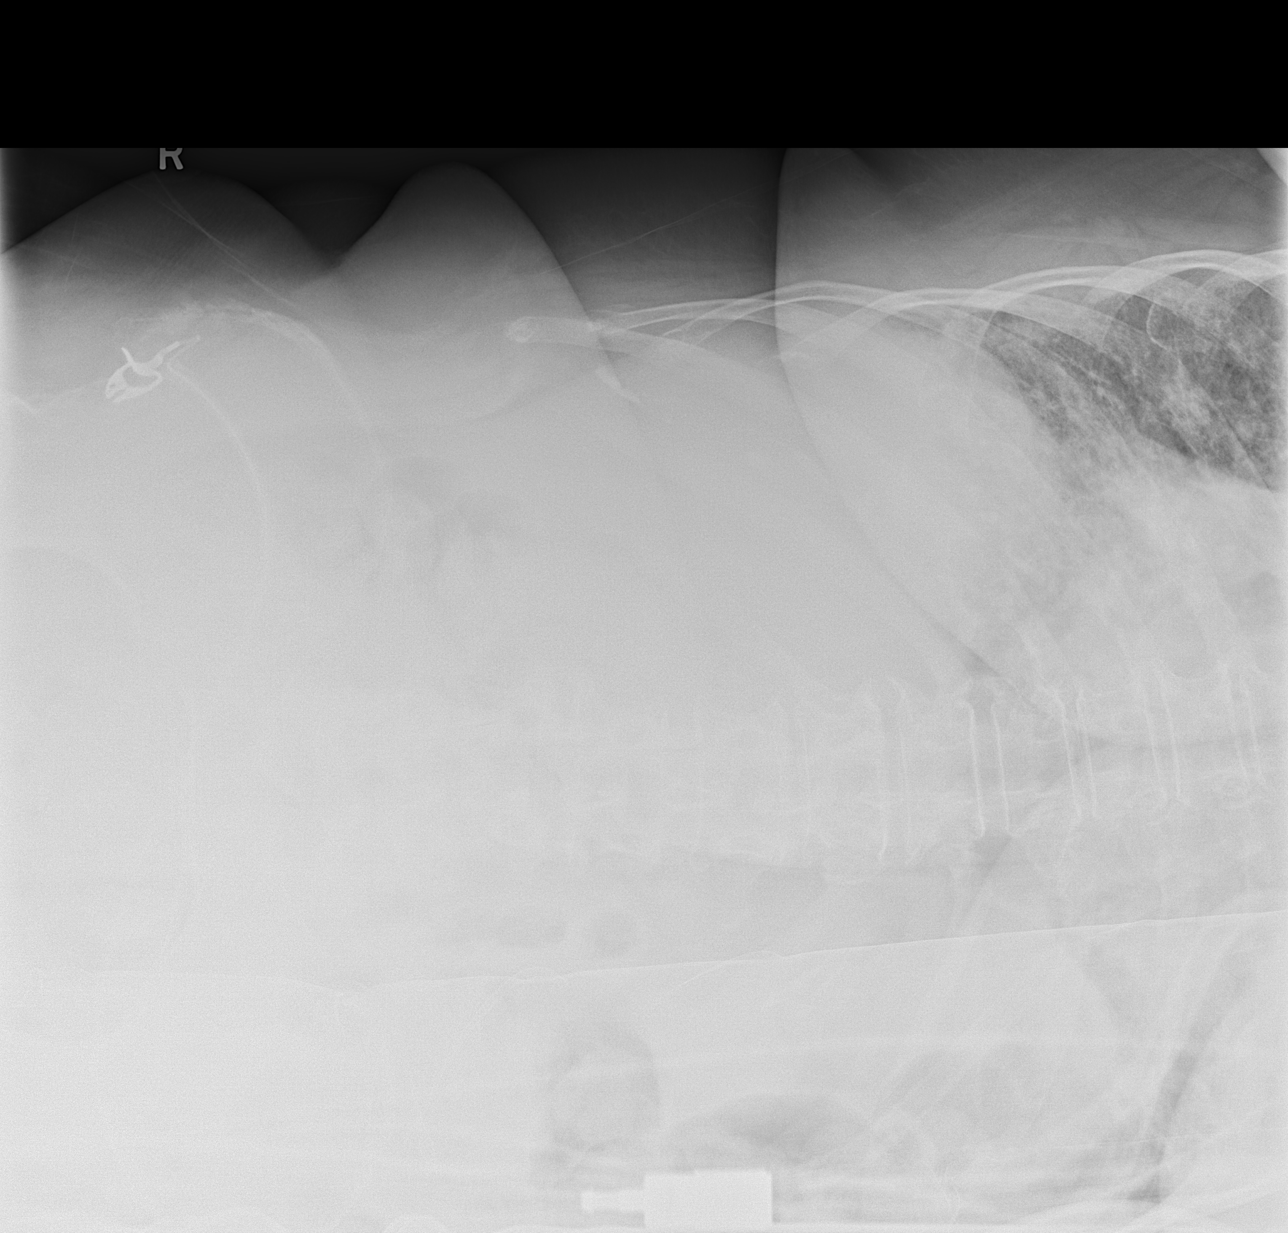

[t abdomen supine]
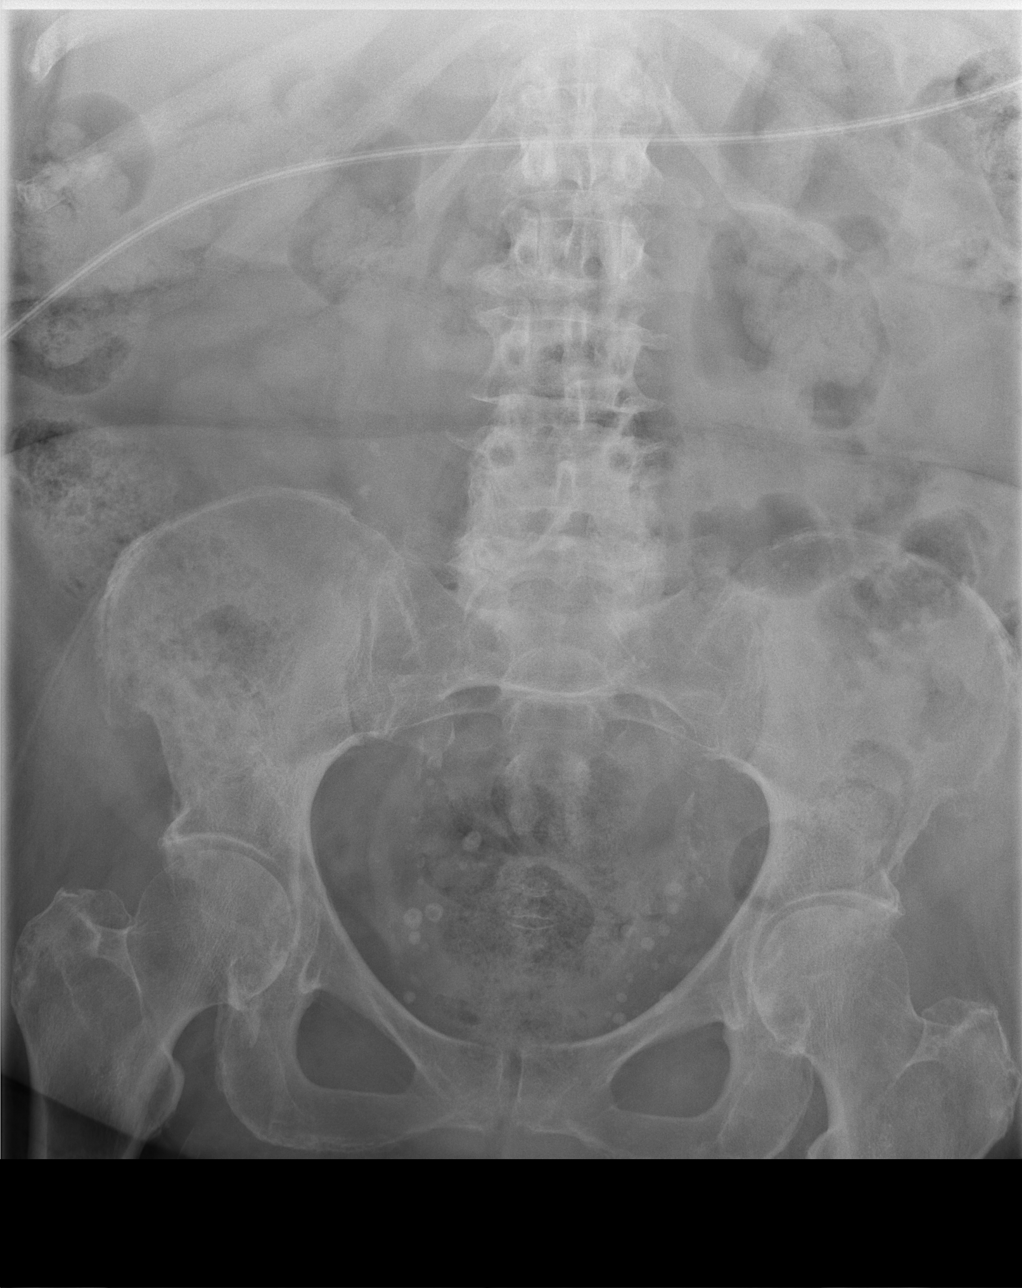

[t chest supine]
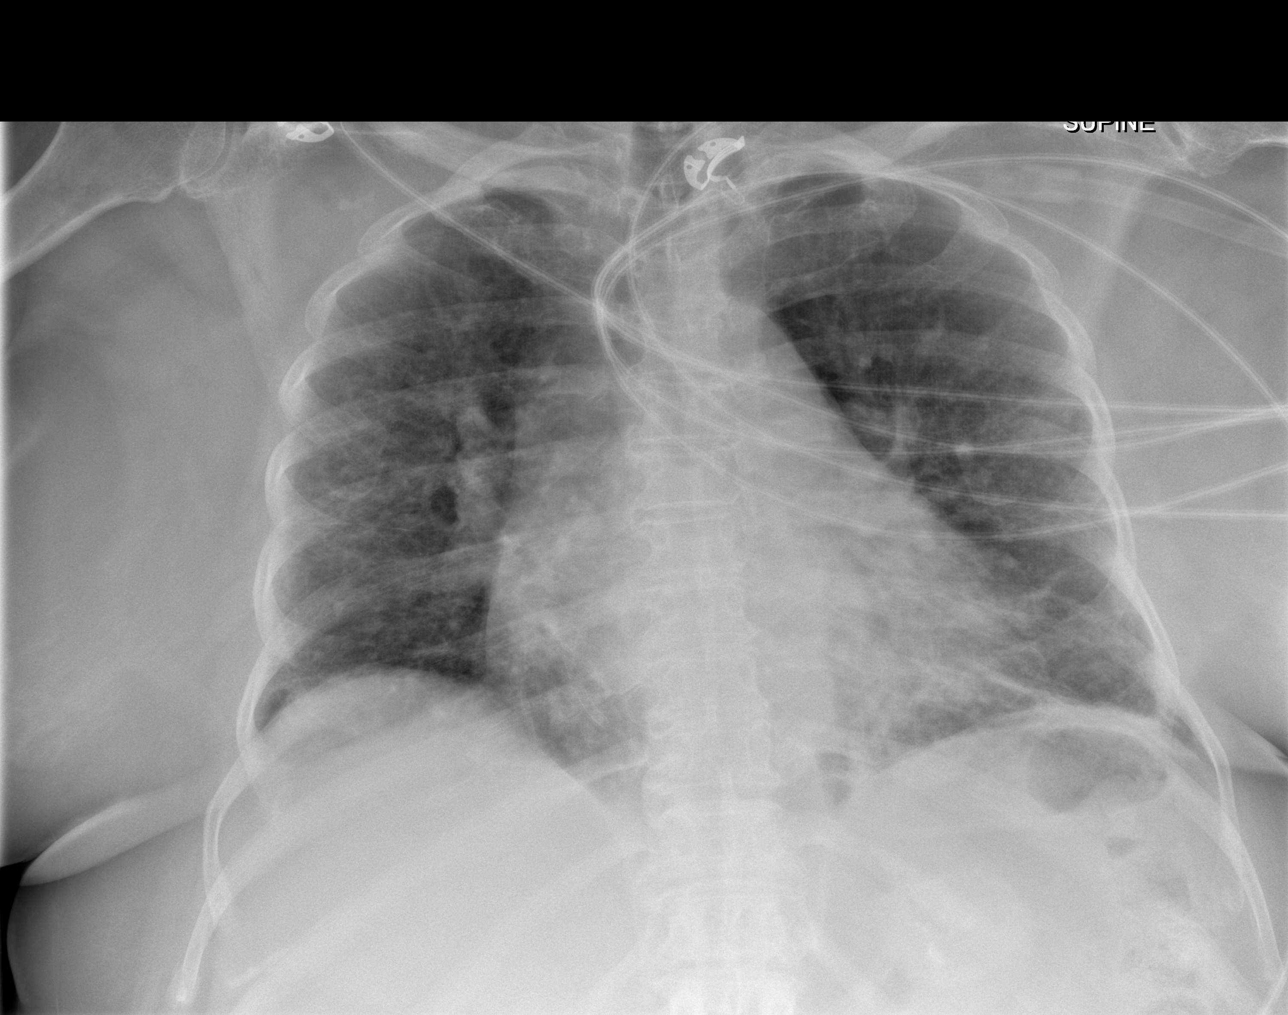

[3 of 3 positions shown; findings below may reference images not displayed]

FINDINGS: PA chest: There is scarring in the left base. There is stable
generalized interstitial prominence, probably reflecting chronic
interstitial fibrosis. No new opacity seen. Heart is mildly enlarged
with pulmonary vascularity within normal limits. No adenopathy.

Supine and left lateral decubitus abdomen: There is diffuse stool
throughout the colon. There is no bowel dilatation or air-fluid
level to suggest obstruction. No free air. There are multiple
phleboliths in the pelvis. Calcifications are noted to the right of
L4 and L5. Suspect small calcified mesenteric lymph nodes are
possible phleboliths.
IMPRESSION: Diffuse stool in colon. Bowel gas pattern unremarkable. Probable
chronic interstitial fibrotic type change. No frank edema or
consolidation. Stable mild cardiac enlargement.

## 2015-12-22 DIAGNOSIS — E1142 Type 2 diabetes mellitus with diabetic polyneuropathy: Secondary | ICD-10-CM | POA: Diagnosis not present

## 2015-12-22 DIAGNOSIS — R634 Abnormal weight loss: Secondary | ICD-10-CM | POA: Diagnosis not present

## 2015-12-22 DIAGNOSIS — Z789 Other specified health status: Secondary | ICD-10-CM | POA: Diagnosis not present

## 2015-12-22 DIAGNOSIS — I509 Heart failure, unspecified: Secondary | ICD-10-CM | POA: Diagnosis not present

## 2015-12-22 DIAGNOSIS — Z299 Encounter for prophylactic measures, unspecified: Secondary | ICD-10-CM | POA: Diagnosis not present

## 2015-12-25 DIAGNOSIS — R634 Abnormal weight loss: Secondary | ICD-10-CM | POA: Diagnosis not present

## 2016-01-04 DIAGNOSIS — Z452 Encounter for adjustment and management of vascular access device: Secondary | ICD-10-CM | POA: Diagnosis not present

## 2016-01-05 ENCOUNTER — Ambulatory Visit: Payer: PRIVATE HEALTH INSURANCE | Admitting: Neurology

## 2016-01-06 DIAGNOSIS — E119 Type 2 diabetes mellitus without complications: Secondary | ICD-10-CM | POA: Diagnosis not present

## 2016-01-06 DIAGNOSIS — I10 Essential (primary) hypertension: Secondary | ICD-10-CM | POA: Diagnosis not present

## 2016-01-06 DIAGNOSIS — F0390 Unspecified dementia without behavioral disturbance: Secondary | ICD-10-CM | POA: Diagnosis not present

## 2016-01-06 DIAGNOSIS — I509 Heart failure, unspecified: Secondary | ICD-10-CM | POA: Diagnosis not present

## 2016-01-08 DIAGNOSIS — L0390 Cellulitis, unspecified: Secondary | ICD-10-CM | POA: Diagnosis not present

## 2016-01-08 DIAGNOSIS — E1142 Type 2 diabetes mellitus with diabetic polyneuropathy: Secondary | ICD-10-CM | POA: Diagnosis not present

## 2016-01-08 DIAGNOSIS — I509 Heart failure, unspecified: Secondary | ICD-10-CM | POA: Diagnosis not present

## 2016-01-09 DIAGNOSIS — I5032 Chronic diastolic (congestive) heart failure: Secondary | ICD-10-CM | POA: Diagnosis not present

## 2016-01-09 DIAGNOSIS — L03116 Cellulitis of left lower limb: Secondary | ICD-10-CM | POA: Diagnosis not present

## 2016-01-09 DIAGNOSIS — E7800 Pure hypercholesterolemia, unspecified: Secondary | ICD-10-CM | POA: Diagnosis not present

## 2016-01-09 DIAGNOSIS — I110 Hypertensive heart disease with heart failure: Secondary | ICD-10-CM | POA: Diagnosis not present

## 2016-01-09 DIAGNOSIS — E1151 Type 2 diabetes mellitus with diabetic peripheral angiopathy without gangrene: Secondary | ICD-10-CM | POA: Diagnosis not present

## 2016-01-09 DIAGNOSIS — I2510 Atherosclerotic heart disease of native coronary artery without angina pectoris: Secondary | ICD-10-CM | POA: Diagnosis not present

## 2016-01-10 DIAGNOSIS — Z79899 Other long term (current) drug therapy: Secondary | ICD-10-CM | POA: Diagnosis not present

## 2016-01-10 DIAGNOSIS — E785 Hyperlipidemia, unspecified: Secondary | ICD-10-CM | POA: Diagnosis not present

## 2016-01-10 DIAGNOSIS — Z91041 Radiographic dye allergy status: Secondary | ICD-10-CM | POA: Diagnosis not present

## 2016-01-10 DIAGNOSIS — F329 Major depressive disorder, single episode, unspecified: Secondary | ICD-10-CM | POA: Diagnosis present

## 2016-01-10 DIAGNOSIS — Z7982 Long term (current) use of aspirin: Secondary | ICD-10-CM | POA: Diagnosis not present

## 2016-01-10 DIAGNOSIS — D500 Iron deficiency anemia secondary to blood loss (chronic): Secondary | ICD-10-CM | POA: Diagnosis not present

## 2016-01-10 DIAGNOSIS — E1140 Type 2 diabetes mellitus with diabetic neuropathy, unspecified: Secondary | ICD-10-CM | POA: Diagnosis present

## 2016-01-10 DIAGNOSIS — J309 Allergic rhinitis, unspecified: Secondary | ICD-10-CM | POA: Diagnosis not present

## 2016-01-10 DIAGNOSIS — M6281 Muscle weakness (generalized): Secondary | ICD-10-CM | POA: Diagnosis not present

## 2016-01-10 DIAGNOSIS — E1151 Type 2 diabetes mellitus with diabetic peripheral angiopathy without gangrene: Secondary | ICD-10-CM | POA: Diagnosis not present

## 2016-01-10 DIAGNOSIS — E7800 Pure hypercholesterolemia, unspecified: Secondary | ICD-10-CM | POA: Diagnosis present

## 2016-01-10 DIAGNOSIS — I252 Old myocardial infarction: Secondary | ICD-10-CM | POA: Diagnosis not present

## 2016-01-10 DIAGNOSIS — R609 Edema, unspecified: Secondary | ICD-10-CM | POA: Diagnosis not present

## 2016-01-10 DIAGNOSIS — I2510 Atherosclerotic heart disease of native coronary artery without angina pectoris: Secondary | ICD-10-CM | POA: Diagnosis present

## 2016-01-10 DIAGNOSIS — G4730 Sleep apnea, unspecified: Secondary | ICD-10-CM | POA: Diagnosis present

## 2016-01-10 DIAGNOSIS — G20 Parkinson's disease: Secondary | ICD-10-CM | POA: Diagnosis not present

## 2016-01-10 DIAGNOSIS — R279 Unspecified lack of coordination: Secondary | ICD-10-CM | POA: Diagnosis not present

## 2016-01-10 DIAGNOSIS — Z8249 Family history of ischemic heart disease and other diseases of the circulatory system: Secondary | ICD-10-CM | POA: Diagnosis not present

## 2016-01-10 DIAGNOSIS — Z7401 Bed confinement status: Secondary | ICD-10-CM | POA: Diagnosis not present

## 2016-01-10 DIAGNOSIS — Z8701 Personal history of pneumonia (recurrent): Secondary | ICD-10-CM | POA: Diagnosis not present

## 2016-01-10 DIAGNOSIS — R262 Difficulty in walking, not elsewhere classified: Secondary | ICD-10-CM | POA: Diagnosis not present

## 2016-01-10 DIAGNOSIS — Z881 Allergy status to other antibiotic agents status: Secondary | ICD-10-CM | POA: Diagnosis not present

## 2016-01-10 DIAGNOSIS — I5032 Chronic diastolic (congestive) heart failure: Secondary | ICD-10-CM | POA: Diagnosis not present

## 2016-01-10 DIAGNOSIS — I110 Hypertensive heart disease with heart failure: Secondary | ICD-10-CM | POA: Diagnosis present

## 2016-01-10 DIAGNOSIS — K219 Gastro-esophageal reflux disease without esophagitis: Secondary | ICD-10-CM | POA: Diagnosis present

## 2016-01-10 DIAGNOSIS — L03116 Cellulitis of left lower limb: Secondary | ICD-10-CM | POA: Diagnosis not present

## 2016-01-10 DIAGNOSIS — D72829 Elevated white blood cell count, unspecified: Secondary | ICD-10-CM | POA: Diagnosis not present

## 2016-01-10 DIAGNOSIS — M1990 Unspecified osteoarthritis, unspecified site: Secondary | ICD-10-CM | POA: Diagnosis present

## 2016-01-10 DIAGNOSIS — I5030 Unspecified diastolic (congestive) heart failure: Secondary | ICD-10-CM | POA: Diagnosis not present

## 2016-01-10 DIAGNOSIS — I739 Peripheral vascular disease, unspecified: Secondary | ICD-10-CM | POA: Diagnosis not present

## 2016-01-10 DIAGNOSIS — E0859 Diabetes mellitus due to underlying condition with other circulatory complications: Secondary | ICD-10-CM | POA: Diagnosis not present

## 2016-01-10 DIAGNOSIS — I10 Essential (primary) hypertension: Secondary | ICD-10-CM | POA: Diagnosis not present

## 2016-01-10 DIAGNOSIS — F0390 Unspecified dementia without behavioral disturbance: Secondary | ICD-10-CM | POA: Diagnosis present

## 2016-01-10 DIAGNOSIS — Z7984 Long term (current) use of oral hypoglycemic drugs: Secondary | ICD-10-CM | POA: Diagnosis not present

## 2016-01-14 DIAGNOSIS — E1122 Type 2 diabetes mellitus with diabetic chronic kidney disease: Secondary | ICD-10-CM | POA: Diagnosis present

## 2016-01-14 DIAGNOSIS — N179 Acute kidney failure, unspecified: Secondary | ICD-10-CM | POA: Diagnosis not present

## 2016-01-14 DIAGNOSIS — Z955 Presence of coronary angioplasty implant and graft: Secondary | ICD-10-CM | POA: Diagnosis not present

## 2016-01-14 DIAGNOSIS — I110 Hypertensive heart disease with heart failure: Secondary | ICD-10-CM | POA: Diagnosis not present

## 2016-01-14 DIAGNOSIS — Z79899 Other long term (current) drug therapy: Secondary | ICD-10-CM | POA: Diagnosis not present

## 2016-01-14 DIAGNOSIS — E785 Hyperlipidemia, unspecified: Secondary | ICD-10-CM | POA: Diagnosis present

## 2016-01-14 DIAGNOSIS — D509 Iron deficiency anemia, unspecified: Secondary | ICD-10-CM | POA: Diagnosis present

## 2016-01-14 DIAGNOSIS — I5032 Chronic diastolic (congestive) heart failure: Secondary | ICD-10-CM | POA: Diagnosis not present

## 2016-01-14 DIAGNOSIS — E1165 Type 2 diabetes mellitus with hyperglycemia: Secondary | ICD-10-CM | POA: Diagnosis not present

## 2016-01-14 DIAGNOSIS — B351 Tinea unguium: Secondary | ICD-10-CM | POA: Diagnosis not present

## 2016-01-14 DIAGNOSIS — K219 Gastro-esophageal reflux disease without esophagitis: Secondary | ICD-10-CM | POA: Diagnosis not present

## 2016-01-14 DIAGNOSIS — I10 Essential (primary) hypertension: Secondary | ICD-10-CM | POA: Diagnosis not present

## 2016-01-14 DIAGNOSIS — Z7982 Long term (current) use of aspirin: Secondary | ICD-10-CM | POA: Diagnosis not present

## 2016-01-14 DIAGNOSIS — R52 Pain, unspecified: Secondary | ICD-10-CM | POA: Diagnosis not present

## 2016-01-14 DIAGNOSIS — J9621 Acute and chronic respiratory failure with hypoxia: Secondary | ICD-10-CM | POA: Diagnosis present

## 2016-01-14 DIAGNOSIS — L84 Corns and callosities: Secondary | ICD-10-CM | POA: Diagnosis not present

## 2016-01-14 DIAGNOSIS — E1142 Type 2 diabetes mellitus with diabetic polyneuropathy: Secondary | ICD-10-CM | POA: Diagnosis not present

## 2016-01-14 DIAGNOSIS — R600 Localized edema: Secondary | ICD-10-CM | POA: Diagnosis not present

## 2016-01-14 DIAGNOSIS — J811 Chronic pulmonary edema: Secondary | ICD-10-CM | POA: Diagnosis not present

## 2016-01-14 DIAGNOSIS — L03116 Cellulitis of left lower limb: Secondary | ICD-10-CM | POA: Diagnosis not present

## 2016-01-14 DIAGNOSIS — Z7401 Bed confinement status: Secondary | ICD-10-CM | POA: Diagnosis not present

## 2016-01-14 DIAGNOSIS — R509 Fever, unspecified: Secondary | ICD-10-CM | POA: Diagnosis not present

## 2016-01-14 DIAGNOSIS — F0390 Unspecified dementia without behavioral disturbance: Secondary | ICD-10-CM | POA: Diagnosis not present

## 2016-01-14 DIAGNOSIS — D72829 Elevated white blood cell count, unspecified: Secondary | ICD-10-CM | POA: Diagnosis not present

## 2016-01-14 DIAGNOSIS — D500 Iron deficiency anemia secondary to blood loss (chronic): Secondary | ICD-10-CM | POA: Diagnosis not present

## 2016-01-14 DIAGNOSIS — E1151 Type 2 diabetes mellitus with diabetic peripheral angiopathy without gangrene: Secondary | ICD-10-CM | POA: Diagnosis not present

## 2016-01-14 DIAGNOSIS — R609 Edema, unspecified: Secondary | ICD-10-CM | POA: Diagnosis not present

## 2016-01-14 DIAGNOSIS — G20 Parkinson's disease: Secondary | ICD-10-CM | POA: Diagnosis present

## 2016-01-14 DIAGNOSIS — K210 Gastro-esophageal reflux disease with esophagitis: Secondary | ICD-10-CM | POA: Diagnosis present

## 2016-01-14 DIAGNOSIS — J9601 Acute respiratory failure with hypoxia: Secondary | ICD-10-CM | POA: Diagnosis not present

## 2016-01-14 DIAGNOSIS — R0902 Hypoxemia: Secondary | ICD-10-CM | POA: Diagnosis not present

## 2016-01-14 DIAGNOSIS — I509 Heart failure, unspecified: Secondary | ICD-10-CM | POA: Diagnosis not present

## 2016-01-14 DIAGNOSIS — M6281 Muscle weakness (generalized): Secondary | ICD-10-CM | POA: Diagnosis not present

## 2016-01-14 DIAGNOSIS — R279 Unspecified lack of coordination: Secondary | ICD-10-CM | POA: Diagnosis not present

## 2016-01-14 DIAGNOSIS — Z794 Long term (current) use of insulin: Secondary | ICD-10-CM | POA: Diagnosis not present

## 2016-01-14 DIAGNOSIS — R262 Difficulty in walking, not elsewhere classified: Secondary | ICD-10-CM | POA: Diagnosis not present

## 2016-01-14 DIAGNOSIS — N182 Chronic kidney disease, stage 2 (mild): Secondary | ICD-10-CM | POA: Diagnosis not present

## 2016-01-14 DIAGNOSIS — E0859 Diabetes mellitus due to underlying condition with other circulatory complications: Secondary | ICD-10-CM | POA: Diagnosis not present

## 2016-01-14 DIAGNOSIS — I5030 Unspecified diastolic (congestive) heart failure: Secondary | ICD-10-CM | POA: Diagnosis not present

## 2016-01-14 DIAGNOSIS — I739 Peripheral vascular disease, unspecified: Secondary | ICD-10-CM | POA: Diagnosis present

## 2016-01-14 DIAGNOSIS — M1990 Unspecified osteoarthritis, unspecified site: Secondary | ICD-10-CM | POA: Diagnosis not present

## 2016-01-14 DIAGNOSIS — R0602 Shortness of breath: Secondary | ICD-10-CM | POA: Diagnosis not present

## 2016-01-14 DIAGNOSIS — D638 Anemia in other chronic diseases classified elsewhere: Secondary | ICD-10-CM | POA: Diagnosis present

## 2016-01-14 DIAGNOSIS — E1140 Type 2 diabetes mellitus with diabetic neuropathy, unspecified: Secondary | ICD-10-CM | POA: Diagnosis not present

## 2016-01-14 DIAGNOSIS — I130 Hypertensive heart and chronic kidney disease with heart failure and stage 1 through stage 4 chronic kidney disease, or unspecified chronic kidney disease: Secondary | ICD-10-CM | POA: Diagnosis present

## 2016-01-14 DIAGNOSIS — Z87442 Personal history of urinary calculi: Secondary | ICD-10-CM | POA: Diagnosis not present

## 2016-01-14 DIAGNOSIS — J309 Allergic rhinitis, unspecified: Secondary | ICD-10-CM | POA: Diagnosis not present

## 2016-01-14 DIAGNOSIS — I2510 Atherosclerotic heart disease of native coronary artery without angina pectoris: Secondary | ICD-10-CM | POA: Diagnosis present

## 2016-01-14 DIAGNOSIS — I272 Other secondary pulmonary hypertension: Secondary | ICD-10-CM | POA: Diagnosis present

## 2016-01-14 DIAGNOSIS — E119 Type 2 diabetes mellitus without complications: Secondary | ICD-10-CM | POA: Diagnosis not present

## 2016-01-14 DIAGNOSIS — E784 Other hyperlipidemia: Secondary | ICD-10-CM | POA: Diagnosis not present

## 2016-01-14 DIAGNOSIS — R634 Abnormal weight loss: Secondary | ICD-10-CM | POA: Diagnosis not present

## 2016-01-14 DIAGNOSIS — I252 Old myocardial infarction: Secondary | ICD-10-CM | POA: Diagnosis not present

## 2016-01-14 DIAGNOSIS — I5033 Acute on chronic diastolic (congestive) heart failure: Secondary | ICD-10-CM | POA: Diagnosis present

## 2016-01-18 DIAGNOSIS — I509 Heart failure, unspecified: Secondary | ICD-10-CM | POA: Diagnosis not present

## 2016-01-18 DIAGNOSIS — E119 Type 2 diabetes mellitus without complications: Secondary | ICD-10-CM | POA: Diagnosis not present

## 2016-01-18 DIAGNOSIS — L84 Corns and callosities: Secondary | ICD-10-CM | POA: Diagnosis not present

## 2016-01-18 DIAGNOSIS — L03116 Cellulitis of left lower limb: Secondary | ICD-10-CM | POA: Diagnosis not present

## 2016-01-18 DIAGNOSIS — E1142 Type 2 diabetes mellitus with diabetic polyneuropathy: Secondary | ICD-10-CM | POA: Diagnosis not present

## 2016-01-18 DIAGNOSIS — B351 Tinea unguium: Secondary | ICD-10-CM | POA: Diagnosis not present

## 2016-01-18 DIAGNOSIS — I10 Essential (primary) hypertension: Secondary | ICD-10-CM | POA: Diagnosis not present

## 2016-01-19 DIAGNOSIS — E119 Type 2 diabetes mellitus without complications: Secondary | ICD-10-CM | POA: Diagnosis not present

## 2016-01-19 DIAGNOSIS — L03116 Cellulitis of left lower limb: Secondary | ICD-10-CM | POA: Diagnosis not present

## 2016-01-19 DIAGNOSIS — I509 Heart failure, unspecified: Secondary | ICD-10-CM | POA: Diagnosis not present

## 2016-01-19 DIAGNOSIS — I10 Essential (primary) hypertension: Secondary | ICD-10-CM | POA: Diagnosis not present

## 2016-01-19 DIAGNOSIS — E784 Other hyperlipidemia: Secondary | ICD-10-CM | POA: Diagnosis not present

## 2016-01-21 DIAGNOSIS — L03116 Cellulitis of left lower limb: Secondary | ICD-10-CM | POA: Diagnosis not present

## 2016-01-22 DIAGNOSIS — R634 Abnormal weight loss: Secondary | ICD-10-CM | POA: Diagnosis not present

## 2016-01-26 ENCOUNTER — Other Ambulatory Visit: Payer: Self-pay | Admitting: Physician Assistant

## 2016-01-28 ENCOUNTER — Inpatient Hospital Stay (HOSPITAL_COMMUNITY)
Admission: EM | Admit: 2016-01-28 | Discharge: 2016-01-30 | DRG: 291 | Disposition: A | Discharge status: Skilled Nursing Facility | Payer: Medicare Other | Attending: Internal Medicine | Admitting: Internal Medicine

## 2016-01-28 ENCOUNTER — Encounter (HOSPITAL_COMMUNITY): Payer: Self-pay | Admitting: Emergency Medicine

## 2016-01-28 ENCOUNTER — Emergency Department (HOSPITAL_COMMUNITY): Payer: Medicare Other

## 2016-01-28 ENCOUNTER — Telehealth: Payer: Self-pay | Admitting: *Deleted

## 2016-01-28 DIAGNOSIS — E1122 Type 2 diabetes mellitus with diabetic chronic kidney disease: Secondary | ICD-10-CM | POA: Diagnosis present

## 2016-01-28 DIAGNOSIS — I5030 Unspecified diastolic (congestive) heart failure: Secondary | ICD-10-CM | POA: Diagnosis not present

## 2016-01-28 DIAGNOSIS — I779 Disorder of arteries and arterioles, unspecified: Secondary | ICD-10-CM | POA: Diagnosis present

## 2016-01-28 DIAGNOSIS — I509 Heart failure, unspecified: Secondary | ICD-10-CM | POA: Insufficient documentation

## 2016-01-28 DIAGNOSIS — E611 Iron deficiency: Secondary | ICD-10-CM | POA: Diagnosis present

## 2016-01-28 DIAGNOSIS — Z7982 Long term (current) use of aspirin: Secondary | ICD-10-CM

## 2016-01-28 DIAGNOSIS — E785 Hyperlipidemia, unspecified: Secondary | ICD-10-CM | POA: Diagnosis present

## 2016-01-28 DIAGNOSIS — Z955 Presence of coronary angioplasty implant and graft: Secondary | ICD-10-CM

## 2016-01-28 DIAGNOSIS — G20 Parkinson's disease: Secondary | ICD-10-CM | POA: Diagnosis present

## 2016-01-28 DIAGNOSIS — R7989 Other specified abnormal findings of blood chemistry: Secondary | ICD-10-CM

## 2016-01-28 DIAGNOSIS — J9621 Acute and chronic respiratory failure with hypoxia: Secondary | ICD-10-CM | POA: Diagnosis present

## 2016-01-28 DIAGNOSIS — J309 Allergic rhinitis, unspecified: Secondary | ICD-10-CM | POA: Diagnosis not present

## 2016-01-28 DIAGNOSIS — R262 Difficulty in walking, not elsewhere classified: Secondary | ICD-10-CM | POA: Diagnosis not present

## 2016-01-28 DIAGNOSIS — N179 Acute kidney failure, unspecified: Secondary | ICD-10-CM | POA: Diagnosis not present

## 2016-01-28 DIAGNOSIS — R0902 Hypoxemia: Secondary | ICD-10-CM

## 2016-01-28 DIAGNOSIS — Z7401 Bed confinement status: Secondary | ICD-10-CM | POA: Diagnosis not present

## 2016-01-28 DIAGNOSIS — Z87442 Personal history of urinary calculi: Secondary | ICD-10-CM

## 2016-01-28 DIAGNOSIS — F0390 Unspecified dementia without behavioral disturbance: Secondary | ICD-10-CM | POA: Diagnosis not present

## 2016-01-28 DIAGNOSIS — Z794 Long term (current) use of insulin: Secondary | ICD-10-CM | POA: Diagnosis not present

## 2016-01-28 DIAGNOSIS — I110 Hypertensive heart disease with heart failure: Secondary | ICD-10-CM | POA: Diagnosis not present

## 2016-01-28 DIAGNOSIS — I10 Essential (primary) hypertension: Secondary | ICD-10-CM

## 2016-01-28 DIAGNOSIS — I252 Old myocardial infarction: Secondary | ICD-10-CM

## 2016-01-28 DIAGNOSIS — D72829 Elevated white blood cell count, unspecified: Secondary | ICD-10-CM | POA: Diagnosis not present

## 2016-01-28 DIAGNOSIS — K210 Gastro-esophageal reflux disease with esophagitis: Secondary | ICD-10-CM | POA: Diagnosis present

## 2016-01-28 DIAGNOSIS — M1990 Unspecified osteoarthritis, unspecified site: Secondary | ICD-10-CM | POA: Diagnosis not present

## 2016-01-28 DIAGNOSIS — E1165 Type 2 diabetes mellitus with hyperglycemia: Secondary | ICD-10-CM | POA: Diagnosis present

## 2016-01-28 DIAGNOSIS — E1140 Type 2 diabetes mellitus with diabetic neuropathy, unspecified: Secondary | ICD-10-CM | POA: Diagnosis not present

## 2016-01-28 DIAGNOSIS — Z79899 Other long term (current) drug therapy: Secondary | ICD-10-CM

## 2016-01-28 DIAGNOSIS — J9601 Acute respiratory failure with hypoxia: Secondary | ICD-10-CM | POA: Diagnosis not present

## 2016-01-28 DIAGNOSIS — I130 Hypertensive heart and chronic kidney disease with heart failure and stage 1 through stage 4 chronic kidney disease, or unspecified chronic kidney disease: Principal | ICD-10-CM | POA: Diagnosis present

## 2016-01-28 DIAGNOSIS — E1121 Type 2 diabetes mellitus with diabetic nephropathy: Secondary | ICD-10-CM

## 2016-01-28 DIAGNOSIS — I739 Peripheral vascular disease, unspecified: Secondary | ICD-10-CM | POA: Diagnosis present

## 2016-01-28 DIAGNOSIS — J811 Chronic pulmonary edema: Secondary | ICD-10-CM | POA: Diagnosis not present

## 2016-01-28 DIAGNOSIS — R778 Other specified abnormalities of plasma proteins: Secondary | ICD-10-CM

## 2016-01-28 DIAGNOSIS — K219 Gastro-esophageal reflux disease without esophagitis: Secondary | ICD-10-CM

## 2016-01-28 DIAGNOSIS — I5033 Acute on chronic diastolic (congestive) heart failure: Secondary | ICD-10-CM | POA: Diagnosis present

## 2016-01-28 DIAGNOSIS — N182 Chronic kidney disease, stage 2 (mild): Secondary | ICD-10-CM | POA: Diagnosis not present

## 2016-01-28 DIAGNOSIS — D638 Anemia in other chronic diseases classified elsewhere: Secondary | ICD-10-CM | POA: Diagnosis present

## 2016-01-28 DIAGNOSIS — R600 Localized edema: Secondary | ICD-10-CM | POA: Diagnosis not present

## 2016-01-28 DIAGNOSIS — N289 Disorder of kidney and ureter, unspecified: Secondary | ICD-10-CM | POA: Insufficient documentation

## 2016-01-28 DIAGNOSIS — I272 Other secondary pulmonary hypertension: Secondary | ICD-10-CM | POA: Diagnosis present

## 2016-01-28 DIAGNOSIS — L03116 Cellulitis of left lower limb: Secondary | ICD-10-CM | POA: Diagnosis not present

## 2016-01-28 DIAGNOSIS — D509 Iron deficiency anemia, unspecified: Secondary | ICD-10-CM | POA: Diagnosis present

## 2016-01-28 DIAGNOSIS — I2510 Atherosclerotic heart disease of native coronary artery without angina pectoris: Secondary | ICD-10-CM | POA: Diagnosis present

## 2016-01-28 DIAGNOSIS — IMO0002 Reserved for concepts with insufficient information to code with codable children: Secondary | ICD-10-CM | POA: Diagnosis present

## 2016-01-28 DIAGNOSIS — M6281 Muscle weakness (generalized): Secondary | ICD-10-CM | POA: Diagnosis not present

## 2016-01-28 DIAGNOSIS — R279 Unspecified lack of coordination: Secondary | ICD-10-CM | POA: Diagnosis not present

## 2016-01-28 DIAGNOSIS — E0859 Diabetes mellitus due to underlying condition with other circulatory complications: Secondary | ICD-10-CM | POA: Diagnosis not present

## 2016-01-28 DIAGNOSIS — R609 Edema, unspecified: Secondary | ICD-10-CM | POA: Diagnosis not present

## 2016-01-28 DIAGNOSIS — E1129 Type 2 diabetes mellitus with other diabetic kidney complication: Secondary | ICD-10-CM | POA: Diagnosis present

## 2016-01-28 DIAGNOSIS — R0602 Shortness of breath: Secondary | ICD-10-CM | POA: Diagnosis not present

## 2016-01-28 DIAGNOSIS — R739 Hyperglycemia, unspecified: Secondary | ICD-10-CM

## 2016-01-28 DIAGNOSIS — D500 Iron deficiency anemia secondary to blood loss (chronic): Secondary | ICD-10-CM | POA: Diagnosis not present

## 2016-01-28 HISTORY — DX: Other chronic pain: G89.29

## 2016-01-28 HISTORY — DX: Radiculopathy, site unspecified: M54.10

## 2016-01-28 HISTORY — DX: Dorsalgia, unspecified: M54.9

## 2016-01-28 HISTORY — DX: Pain in unspecified knee: M25.569

## 2016-01-28 LAB — GLUCOSE, CAPILLARY: Glucose-Capillary: 181 mg/dL — ABNORMAL HIGH (ref 65–99)

## 2016-01-28 LAB — COMPREHENSIVE METABOLIC PANEL
ALBUMIN: 3 g/dL — AB (ref 3.5–5.0)
ALT: 8 U/L — ABNORMAL LOW (ref 14–54)
AST: 19 U/L (ref 15–41)
Alkaline Phosphatase: 85 U/L (ref 38–126)
Anion gap: 10 (ref 5–15)
BUN: 48 mg/dL — ABNORMAL HIGH (ref 6–20)
CALCIUM: 9.3 mg/dL (ref 8.9–10.3)
CHLORIDE: 94 mmol/L — AB (ref 101–111)
CO2: 25 mmol/L (ref 22–32)
Creatinine, Ser: 1.88 mg/dL — ABNORMAL HIGH (ref 0.44–1.00)
GFR calc non Af Amer: 23 mL/min — ABNORMAL LOW (ref >60)
GFR, EST AFRICAN AMERICAN: 27 mL/min — AB (ref >60)
GLUCOSE: 300 mg/dL — AB (ref 65–99)
POTASSIUM: 5.1 mmol/L (ref 3.5–5.1)
Sodium: 129 mmol/L — ABNORMAL LOW (ref 135–145)
Total Bilirubin: 0.6 mg/dL (ref 0.3–1.2)
Total Protein: 8 g/dL (ref 6.5–8.1)

## 2016-01-28 LAB — CBG MONITORING, ED: Glucose-Capillary: 227 mg/dL — ABNORMAL HIGH (ref 65–99)

## 2016-01-28 LAB — CBC WITH DIFFERENTIAL/PLATELET
BASOS ABS: 0 10*3/uL (ref 0.0–0.1)
BASOS PCT: 0 %
Eosinophils Absolute: 0 10*3/uL (ref 0.0–0.7)
Eosinophils Relative: 0 %
HCT: 27.4 % — ABNORMAL LOW (ref 36.0–46.0)
Hemoglobin: 8.5 g/dL — ABNORMAL LOW (ref 12.0–15.0)
LYMPHS PCT: 11 %
Lymphs Abs: 1.6 10*3/uL (ref 0.7–4.0)
MCH: 23.4 pg — ABNORMAL LOW (ref 26.0–34.0)
MCHC: 31 g/dL (ref 30.0–36.0)
MCV: 75.3 fL — AB (ref 78.0–100.0)
MONO ABS: 1.3 10*3/uL — AB (ref 0.1–1.0)
Monocytes Relative: 9 %
Neutro Abs: 11.7 10*3/uL — ABNORMAL HIGH (ref 1.7–7.7)
Neutrophils Relative %: 80 %
Platelets: 281 10*3/uL (ref 150–400)
RBC: 3.64 MIL/uL — AB (ref 3.87–5.11)
RDW: 18.4 % — AB (ref 11.5–15.5)
WBC: 14.6 10*3/uL — AB (ref 4.0–10.5)

## 2016-01-28 LAB — URINALYSIS, ROUTINE W REFLEX MICROSCOPIC
Bilirubin Urine: NEGATIVE
GLUCOSE, UA: NEGATIVE mg/dL
Hgb urine dipstick: NEGATIVE
KETONES UR: NEGATIVE mg/dL
LEUKOCYTES UA: NEGATIVE
NITRITE: NEGATIVE
PROTEIN: NEGATIVE mg/dL
Specific Gravity, Urine: 1.01 (ref 1.005–1.030)
pH: 6.5 (ref 5.0–8.0)

## 2016-01-28 LAB — LACTIC ACID, PLASMA
LACTIC ACID, VENOUS: 1.8 mmol/L (ref 0.5–2.0)
LACTIC ACID, VENOUS: 1.5 mmol/L (ref 0.5–2.0)

## 2016-01-28 LAB — TROPONIN I
TROPONIN I: 0.04 ng/mL — AB (ref <0.031)
Troponin I: 0.03 ng/mL (ref <0.031)

## 2016-01-28 LAB — PROCALCITONIN: PROCALCITONIN: 3.2 ng/mL

## 2016-01-28 LAB — TYPE AND SCREEN
ABO/RH(D): A POS
ANTIBODY SCREEN: NEGATIVE

## 2016-01-28 LAB — HEMOGLOBIN AND HEMATOCRIT, BLOOD
HEMATOCRIT: 25.1 % — AB (ref 36.0–46.0)
Hemoglobin: 7.8 g/dL — ABNORMAL LOW (ref 12.0–15.0)

## 2016-01-28 LAB — BRAIN NATRIURETIC PEPTIDE: B Natriuretic Peptide: 1059 pg/mL — ABNORMAL HIGH (ref 0.0–100.0)

## 2016-01-28 LAB — LIPASE, BLOOD: Lipase: 16 U/L (ref 11–51)

## 2016-01-28 LAB — POC OCCULT BLOOD, ED: Fecal Occult Bld: NEGATIVE

## 2016-01-28 MED ORDER — SODIUM CHLORIDE 0.9% FLUSH
3.0000 mL | Freq: Two times a day (BID) | INTRAVENOUS | Status: DC
  Administered 2016-01-29 – 2016-01-30 (×3): 3 mL via INTRAVENOUS

## 2016-01-28 MED ORDER — IPRATROPIUM-ALBUTEROL 0.5-2.5 (3) MG/3ML IN SOLN
3.0000 mL | Freq: Once | RESPIRATORY_TRACT | Status: AC
  Administered 2016-01-28: 3 mL via RESPIRATORY_TRACT
  Filled 2016-01-28: qty 3

## 2016-01-28 MED ORDER — GABAPENTIN 100 MG PO CAPS
100.0000 mg | ORAL_CAPSULE | Freq: Two times a day (BID) | ORAL | Status: DC
  Administered 2016-01-28 – 2016-01-30 (×4): 100 mg via ORAL
  Filled 2016-01-28 (×4): qty 1

## 2016-01-28 MED ORDER — INSULIN ASPART 100 UNIT/ML ~~LOC~~ SOLN
0.0000 [IU] | Freq: Three times a day (TID) | SUBCUTANEOUS | Status: DC
  Administered 2016-01-29: 3 [IU] via SUBCUTANEOUS
  Administered 2016-01-29: 2 [IU] via SUBCUTANEOUS
  Administered 2016-01-30: 5 [IU] via SUBCUTANEOUS

## 2016-01-28 MED ORDER — SPIRONOLACTONE 25 MG PO TABS
25.0000 mg | ORAL_TABLET | Freq: Every day | ORAL | Status: DC
  Administered 2016-01-29 – 2016-01-30 (×2): 25 mg via ORAL
  Filled 2016-01-28 (×2): qty 1

## 2016-01-28 MED ORDER — FAMOTIDINE IN NACL 20-0.9 MG/50ML-% IV SOLN
20.0000 mg | Freq: Once | INTRAVENOUS | Status: AC
  Administered 2016-01-28: 20 mg via INTRAVENOUS
  Filled 2016-01-28: qty 50

## 2016-01-28 MED ORDER — PROMETHAZINE HCL 12.5 MG PO TABS: 12.5000 mg | ORAL_TABLET | Freq: Four times a day (QID) | ORAL | Status: DC | PRN

## 2016-01-28 MED ORDER — CARBIDOPA-LEVODOPA 25-100 MG PO TABS
1.0000 | ORAL_TABLET | Freq: Three times a day (TID) | ORAL | Status: DC
Start: 2016-01-28 — End: 2016-01-30
  Administered 2016-01-28 – 2016-01-30 (×5): 1 via ORAL
  Filled 2016-01-28 (×5): qty 1

## 2016-01-28 MED ORDER — HEPARIN SODIUM (PORCINE) 5000 UNIT/ML IJ SOLN
5000.0000 [IU] | Freq: Three times a day (TID) | INTRAMUSCULAR | Status: DC
  Administered 2016-01-28 – 2016-01-29 (×2): 5000 [IU] via SUBCUTANEOUS
  Filled 2016-01-28 (×3): qty 1

## 2016-01-28 MED ORDER — FUROSEMIDE 10 MG/ML IJ SOLN
40.0000 mg | Freq: Once | INTRAMUSCULAR | Status: AC
  Administered 2016-01-28: 40 mg via INTRAVENOUS
  Filled 2016-01-28: qty 4

## 2016-01-28 MED ORDER — SODIUM CHLORIDE 0.9 % IV SOLN
INTRAVENOUS | Status: DC
  Administered 2016-01-28: 15:00:00 via INTRAVENOUS

## 2016-01-28 MED ORDER — ONDANSETRON HCL 4 MG/2ML IJ SOLN: 4.0000 mg | Freq: Four times a day (QID) | INTRAMUSCULAR | Status: DC | PRN

## 2016-01-28 MED ORDER — SODIUM CHLORIDE 0.9 % IV SOLN: Freq: Once | INTRAVENOUS | Status: DC

## 2016-01-28 MED ORDER — SODIUM CHLORIDE 0.9% FLUSH: 3.0000 mL | INTRAVENOUS | Status: DC | PRN

## 2016-01-28 MED ORDER — ADULT MULTIVITAMIN W/MINERALS CH
1.0000 | ORAL_TABLET | Freq: Every day | ORAL | Status: DC
  Administered 2016-01-29 – 2016-01-30 (×2): 1 via ORAL
  Filled 2016-01-28 (×2): qty 1

## 2016-01-28 MED ORDER — ACETAMINOPHEN 325 MG PO TABS: 650.0000 mg | ORAL_TABLET | Freq: Four times a day (QID) | ORAL | Status: DC | PRN

## 2016-01-28 MED ORDER — PANTOPRAZOLE SODIUM 40 MG PO TBEC
40.0000 mg | DELAYED_RELEASE_TABLET | Freq: Every day | ORAL | Status: DC
  Administered 2016-01-29 – 2016-01-30 (×2): 40 mg via ORAL
  Filled 2016-01-28 (×2): qty 1

## 2016-01-28 MED ORDER — ASPIRIN 325 MG PO TABS
325.0000 mg | ORAL_TABLET | Freq: Every day | ORAL | Status: DC
  Administered 2016-01-29 – 2016-01-30 (×2): 325 mg via ORAL
  Filled 2016-01-28 (×2): qty 1

## 2016-01-28 MED ORDER — FOLIC ACID 1 MG PO TABS
1.0000 mg | ORAL_TABLET | Freq: Every day | ORAL | Status: DC
  Administered 2016-01-29 – 2016-01-30 (×2): 1 mg via ORAL
  Filled 2016-01-28 (×2): qty 1

## 2016-01-28 MED ORDER — SODIUM CHLORIDE 0.9 % IV SOLN: 250.0000 mL | INTRAVENOUS | Status: DC | PRN

## 2016-01-28 MED ORDER — FUROSEMIDE 10 MG/ML IJ SOLN
40.0000 mg | Freq: Two times a day (BID) | INTRAMUSCULAR | Status: DC
  Administered 2016-01-29 – 2016-01-30 (×3): 40 mg via INTRAVENOUS
  Filled 2016-01-28 (×3): qty 4

## 2016-01-28 MED ORDER — ACETAMINOPHEN 325 MG PO TABS
650.0000 mg | ORAL_TABLET | ORAL | Status: DC | PRN
Start: 2016-01-28 — End: 2016-01-30

## 2016-01-28 MED ORDER — ATORVASTATIN CALCIUM 20 MG PO TABS
20.0000 mg | ORAL_TABLET | Freq: Every day | ORAL | Status: DC
  Administered 2016-01-28 – 2016-01-29 (×2): 20 mg via ORAL
  Filled 2016-01-28 (×2): qty 1

## 2016-01-28 NOTE — ED Provider Notes (Signed)
CSN: [id number]     Arrival date & time 01/28/16  1427 History   First MD Initiated Contact with Patient 01/28/16 1443     Chief Complaint  Patient presents with  . Shortness of Breath    HPI Pt was seen at 1445. Per pt and her daughter: c/o gradual onset and persistence of constant "low O2 Sats" and "fever" since last night. Pt's daughter states she was told pt had fever of "101," LD tylenol approximately noon today. Pt's daughter states she was told O2 Sats were "in the 80's" on R/A, but came "up into the 90's" on O2 2L N/C. Pt was tx for LLE cellulitis 2 weeks ago by OSH. Pt's daughter states pt's LLE "has been swollen this whole time." Pt told daughter today her mid-epigastric abd area "hurt." Denies CP/palpitations, no cough, no N/V/D, no rash, no focal motor weakness, no tingling/numbness in extremities.     Past Medical History  Diagnosis Date  . Hypertension   . Nephrolithiasis   . GERD (gastroesophageal reflux disease)   . Arthritis   . Psoriasis   . Morbid obesity ([place])   . Mitral regurgitation   . Hyperlipidemia   . Sleep apnea     uses cpap  . CHF (congestive heart failure) (HCC)     Diastolic. EF 55-65% by cath 10/11/11  . Diabetes mellitus     insulin dependent  . Acute renal insufficiency     09/2011. not on ACEI secondary to this (also has renal artery stenosis)  . Peripheral vascular disease ([place])     severe right renal artery stenosis and left SFA stenosis by PV angio 09/2011, treated medically  . Cellulitis   . CAD (coronary artery disease)     NSTEMI 09/2011 felt poor candidate for CABG, instead s/p PTCA/DES to mid RCA 10/17/11  . Chronic respiratory failure ([place])   . Pulmonary hypertension ([place])   . Obstructive sleep apnea   . CKD (chronic kidney disease) stage 2, GFR 60-89 ml/min 07/11/2013  . Vitamin D deficiency    Past Surgical History  Procedure Laterality Date  . Acromio-clavicular joint repair  2011  . Abdominal hysterectomy    . Eye surgery       cataract  OD  . Lower extremity angiogram N/A 10/11/2011    Procedure: LOWER EXTREMITY ANGIOGRAM;  Surgeon: Sherren Mocha, MD;  Location: [facility] CATH LAB;  Service: Cardiovascular;  Laterality: N/A;  . Left and right heart catheterization with coronary angiogram N/A 10/11/2011    Procedure: LEFT AND RIGHT HEART CATHETERIZATION WITH CORONARY ANGIOGRAM;  Surgeon: Sherren Mocha, MD;  Location: [facility] CATH LAB;  Service: Cardiovascular;  Laterality: N/A;  . Percutaneous coronary stent intervention (pci-s) N/A 10/17/2011    Procedure: PERCUTANEOUS CORONARY STENT INTERVENTION (PCI-S);  Surgeon: Burnell Blanks, MD;  Location: [facility] CATH LAB;  Service: Cardiovascular;  Laterality: N/A;   Family History  Problem Relation Age of Onset  . Psoriasis Brother    Social History  Substance Use Topics  . Smoking status: Never Smoker   . Smokeless tobacco: Never Used  . Alcohol Use: No   OB History    Gravida Para Term Preterm AB TAB SAB Ectopic Multiple Living   4 4 4             Review of Systems ROS: Statement: All systems negative except as marked or noted in the HPI; Constitutional: +fever and chills. ; ; Eyes: Negative for eye pain, redness and discharge. ; ; ENMT: Negative for  ear pain, hoarseness, nasal congestion, sinus pressure and sore throat. ; ; Cardiovascular: Negative for chest pain, palpitations, diaphoresis, dyspnea. ; ; Respiratory: +"low O2 Sats." Negative for cough, wheezing and stridor. ; ; Gastrointestinal: +abd pain. Negative for nausea, vomiting, diarrhea, blood in stool, hematemesis, jaundice and rectal bleeding. . ; ; Genitourinary: Negative for dysuria, flank pain and hematuria. ; ; Musculoskeletal: +LLE edema. Negative for back pain and neck pain. Negative for trauma.; ; Skin: Negative for pruritus, rash, abrasions, blisters, bruising and skin lesion.; ; Neuro: Negative for headache, lightheadedness and neck stiffness. Negative for weakness, altered level of consciousness, altered mental  status, extremity weakness, paresthesias, involuntary movement, seizure and syncope.      Allergies  Ivp dye and Rocephin  Home Medications   Prior to Admission medications   Medication Sig Start Date End Date Taking? Authorizing Provider  acetaminophen (TYLENOL) 325 MG tablet Take 650 mg by mouth every 6 (six) hours as needed.   Yes Historical Provider, MD  amLODipine (NORVASC) 10 MG tablet TAKE 1 TABLET (10 MG TOTAL) BY MOUTH DAILY. 01/26/16  Yes Herminio Commons, MD  aspirin 325 MG tablet Take 325 mg by mouth daily.   Yes Historical Provider, MD  atorvastatin (LIPITOR) 20 MG tablet Take 1 tablet (20 mg total) by mouth at bedtime. 05/20/15  Yes Imogene Burn, PA-C  carbidopa-levodopa (SINEMET IR) 25-100 MG tablet 1 tablet at 8 am, 1 tablet at 12 pm, 1 tablet at 5 pm 09/08/15  Yes Rebecca S Tat, DO  fexofenadine (ALLEGRA ALLERGY) 180 MG tablet Take 180 mg by mouth daily.   Yes Historical Provider, MD  folic acid (FOLVITE) 1 MG tablet TAKE 1 TABLET BY MOUTH DAILY. 11/25/15  Yes Herminio Commons, MD  furosemide (LASIX) 40 MG tablet TAKE 1-3 TABLETS (40-120 MG TOTAL) BY MOUTH DAILY. BASED ON DEGREE OF SWELLING/FLUID BUILDUP 12/02/15  Yes Herminio Commons, MD  gabapentin (NEURONTIN) 100 MG capsule Take 2 capsules (200 mg total) by mouth 2 (two) times daily. Patient taking differently: Take 100 mg by mouth 2 (two) times daily.  07/14/14  Yes Geradine Girt, DO  hydrALAZINE (APRESOLINE) 50 MG tablet TAKE 1 TABLET BY MOUTH 2 TIMES DAILY. 01/26/16  Yes Herminio Commons, MD  Menthol-Zinc Oxide (CALMOSEPTINE) 0.44-20.6 % OINT Apply 1 application topically 3 (three) times daily. Apply to groin area for protective barrier.   Yes Historical Provider, MD  metFORMIN (GLUCOPHAGE) 500 MG tablet Take 500 mg by mouth 2 (two) times daily with a meal.   Yes Historical Provider, MD  Multiple Vitamins-Minerals (MULTIVITAMINS THER. W/MINERALS) TABS tablet Take 1 tablet by mouth daily.   Yes Historical  Provider, MD  nystatin (NYSTATIN) powder Apply 1 g topically 4 (four) times daily.   Yes Historical Provider, MD  omeprazole (PRILOSEC) 20 MG capsule Take 20 mg by mouth daily.   Yes Historical Provider, MD  promethazine (PHENERGAN) 12.5 MG tablet Take 12.5 mg by mouth every 6 (six) hours as needed for nausea or vomiting.   Yes Historical Provider, MD  sodium bicarbonate 325 MG tablet Take 325 mg by mouth 2 (two) times daily.   Yes Historical Provider, MD  spironolactone (ALDACTONE) 25 MG tablet TAKE 1 TABLET BY MOUTH DAILY. 01/26/16  Yes Herminio Commons, MD   BP 127/49 mmHg  Pulse 62  Resp 26  Ht 5\' 3"  (1.6 m)  Wt 149 lb (67.586 kg)  BMI 26.40 kg/m2  SpO2 86%  LMP 08/29/1990 Physical Exam  1450:  Physical examination:  Nursing notes reviewed; Vital signs and O2 SAT reviewed;  Constitutional: Well developed, Well nourished, Well hydrated, In no acute distress; Head:  Normocephalic, atraumatic; Eyes: EOMI, PERRL, No scleral icterus; ENMT: Mouth and pharynx normal, Mucous membranes moist; Neck: Supple, Full range of motion, No lymphadenopathy; Cardiovascular: Regular rate and rhythm, No gallop; Respiratory: Breath sounds diminished & equal bilaterally, No wheezes.  Speaking full sentences with ease, Normal respiratory effort/excursion; Chest: Nontender, Movement normal; Abdomen: Soft, +mid-epigastric tenderness to palp. No rebound or guarding. Nondistended, Normal bowel sounds; Genitourinary: No CVA tenderness; Extremities: Pulses normal, No tenderness, +2 edema LLE with calf asymmetry and chronic skin changes. No rash. No deformity, NT knee/ankle/foot. Muscles compartments soft.; Neuro: AA&Ox3, vague historian. Major CN grossly intact.  Speech clear. No gross focal motor deficits in extremities.; Skin: Color normal, Warm, Dry.   ED Course  Procedures (including critical care time) Labs Review   Imaging Review  I have personally reviewed and evaluated these images and lab results as part  of my medical decision-making.   EKG Interpretation   Date/Time:  Thursday January 28 2016 14:40:07 EDT Ventricular Rate:  61 PR Interval:  132 QRS Duration: 89 QT Interval:  462 QTC Calculation: 465 R Axis:   18 Text Interpretation:  Sinus rhythm Repol abnrm suggests ischemia, diffuse  leads Baseline wander When compared with ECG of 11/04/2014 T wave  abnormality Anterior leads is now Present Confirmed by [facility]  MD,  Nunzio Cory [phone]) on 01/28/2016 3:19:55 PM      MDM  MDM Reviewed: previous chart, nursing note and vitals Reviewed previous: labs and ECG Interpretation: labs, ECG, x-ray and ultrasound   Results for orders placed or performed during the hospital encounter of 01/28/16  Comprehensive metabolic panel  Result Value Ref Range   Sodium 129 (L) 135 - 145 mmol/L   Potassium 5.1 3.5 - 5.1 mmol/L   Chloride 94 (L) 101 - 111 mmol/L   CO2 25 22 - 32 mmol/L   Glucose, Bld 300 (H) 65 - 99 mg/dL   BUN 48 (H) 6 - 20 mg/dL   Creatinine, Ser 1.88 (H) 0.44 - 1.00 mg/dL   Calcium 9.3 8.9 - 10.3 mg/dL   Total Protein 8.0 6.5 - 8.1 g/dL   Albumin 3.0 (L) 3.5 - 5.0 g/dL   AST 19 15 - 41 U/L   ALT 8 (L) 14 - 54 U/L   Alkaline Phosphatase 85 38 - 126 U/L   Total Bilirubin 0.6 0.3 - 1.2 mg/dL   GFR calc non Af Amer 23 (L) >60 mL/min   GFR calc Af Amer 27 (L) >60 mL/min   Anion gap 10 5 - 15  Lipase, blood  Result Value Ref Range   Lipase 16 11 - 51 U/L  Troponin I  Result Value Ref Range   Troponin I 0.04 (H) <0.031 ng/mL  Lactic acid, plasma  Result Value Ref Range   Lactic Acid, Venous 1.8 0.5 - 2.0 mmol/L  CBC with Differential  Result Value Ref Range   WBC 14.6 (H) 4.0 - 10.5 K/uL   RBC 3.64 (L) 3.87 - 5.11 MIL/uL   Hemoglobin 8.5 (L) 12.0 - 15.0 g/dL   HCT 27.4 (L) 36.0 - 46.0 %   MCV 75.3 (L) 78.0 - 100.0 fL   MCH 23.4 (L) 26.0 - 34.0 pg   MCHC 31.0 30.0 - 36.0 g/dL   RDW 18.4 (H) 11.5 - 15.5 %   Platelets 281 150 - 400 K/uL  Neutrophils Relative % 80 %    Neutro Abs 11.7 (H) 1.7 - 7.7 K/uL   Lymphocytes Relative 11 %   Lymphs Abs 1.6 0.7 - 4.0 K/uL   Monocytes Relative 9 %   Monocytes Absolute 1.3 (H) 0.1 - 1.0 K/uL   Eosinophils Relative 0 %   Eosinophils Absolute 0.0 0.0 - 0.7 K/uL   Basophils Relative 0 %   Basophils Absolute 0.0 0.0 - 0.1 K/uL  Urinalysis, Routine w reflex microscopic  Result Value Ref Range   Color, Urine YELLOW YELLOW   APPearance CLEAR CLEAR   Specific Gravity, Urine 1.010 1.005 - 1.030   pH 6.5 5.0 - 8.0   Glucose, UA NEGATIVE NEGATIVE mg/dL   Hgb urine dipstick NEGATIVE NEGATIVE   Bilirubin Urine NEGATIVE NEGATIVE   Ketones, ur NEGATIVE NEGATIVE mg/dL   Protein, ur NEGATIVE NEGATIVE mg/dL   Nitrite NEGATIVE NEGATIVE   Leukocytes, UA NEGATIVE NEGATIVE  Brain natriuretic peptide  Result Value Ref Range   B Natriuretic Peptide 1059.0 (H) 0.0 - 100.0 pg/mL  POC occult blood, ED  Result Value Ref Range   Fecal Occult Bld NEGATIVE NEGATIVE  CBG monitoring, ED  Result Value Ref Range   Glucose-Capillary 227 (H) 65 - 99 mg/dL   Dg Chest 2 View 01/28/2016  CLINICAL DATA:  Fever, hypoxia EXAM: CHEST  2 VIEW COMPARISON:  02/23/2015 FINDINGS: Right subclavian port catheter tip proximal SVC as before. Heart is enlarged. Diffuse increased vascular congestion and interstitial opacities with basilar atelectasis, suspect developing CHF. No large effusion. Limited lateral views because of technique and positioning. Trachea is midline. Atherosclerosis of the aorta. Degenerative changes of the spine and shoulders. IMPRESSION: Cardiomegaly with new diffuse interstitial edema pattern and basilar atelectasis, suspect mild CHF. Electronically Signed   By: Jerilynn Mages.  Shick M.D.   On: 01/28/2016 16:07   US Venous Img Lower Unilateral Left 01/28/2016  CLINICAL DATA:  Left lower extremity edema EXAM: LEFT LOWER EXTREMITY VENOUS DUPLEX ULTRASOUND TECHNIQUE: Gray-scale sonography with graded compression, as well as color Doppler and duplex  ultrasound were performed to evaluate the left lower extremity deep venous system from the level of the common femoral vein and including the common femoral, femoral, profunda femoral, popliteal and calf veins including the posterior tibial, peroneal and gastrocnemius veins when visible. The superficial great saphenous vein was also interrogated. Spectral Doppler was utilized to evaluate flow at rest and with distal augmentation maneuvers in the common femoral, femoral and popliteal veins. COMPARISON:  None. FINDINGS: Contralateral Common Femoral Vein: Respiratory phasicity is normal and symmetric with the symptomatic side. No evidence of thrombus. Normal compressibility. Common Femoral Vein: No evidence of thrombus. Normal compressibility, respiratory phasicity and response to augmentation. Saphenofemoral Junction: No evidence of thrombus. Normal compressibility and flow on color Doppler imaging. Profunda Femoral Vein: No evidence of thrombus. Normal compressibility and flow on color Doppler imaging. Femoral Vein: No evidence of thrombus. Normal compressibility, respiratory phasicity and response to augmentation. Popliteal Vein: No evidence of thrombus. Normal compressibility, respiratory phasicity and response to augmentation. Calf Veins: No evidence of thrombus. Normal compressibility and flow on color Doppler imaging. Superficial Great Saphenous Vein: No evidence of thrombus. Normal compressibility and flow on color Doppler imaging. Venous Reflux:  None. Other Findings:  There is extensive left lower extremity edema. IMPRESSION: No evidence of left lower extremity deep venous thrombosis. Right common femoral vein also patent. There is left lower extremity soft tissue edema noted. Electronically Signed   By: Lowella Grip III M.D.  On: 01/28/2016 15:46   Results for [place], [place] H (MRN [id number]) as of 01/28/2016 17:14  Ref. Range 11/05/2014 06:21 01/13/2015 13:25 02/16/2015 07:49 01/28/2016 15:14  BUN Latest  Ref Range: 6-20 mg/dL 64 (H) 32 (H) <5 (L) 48 (H)  Creatinine Latest Ref Range: 0.44-1.00 mg/dL 1.27 (H) 1.37 (H) 0.80 1.88 (H)   Results for [place], [place] H (MRN [id number]) as of 01/28/2016 17:14  Ref. Range 08/29/2014 04:07 01/28/2016 15:14  B Natriuretic Peptide Latest Ref Range: 0.0-100.0 pg/mL 994.0 (H) 1059.0 (H)   Results for [place], [place] H (MRN [id number]) as of 01/28/2016 17:14  Ref. Range 11/05/2014 06:21 01/13/2015 13:25 02/16/2015 07:49 02/16/2015 16:50 01/28/2016 15:14  Hemoglobin Latest Ref Range: 12.0-15.0 g/dL 13.0 13.6 13.5 12.6 8.5 (L)  HCT Latest Ref Range: 36.0-46.0 % 40.1 42.3 39.6 37.7 27.4 (L)    1625:  O2 Sats 86% R/A on arrival; increase to 96-98% on O2 2L N/C. H/H lower than baseline. Rectal exam performed w/permission of pt and ED RN chaperone present.  Anal tone normal.  Non-tender, soft brown stool in rectal vault, heme neg.  No fissures, no external hemorrhoids, no palp masses.   1735:  Na corrects to 134 for hyperglycemia. Pt not acidotic. No fever while in the ED. BNP elevated with CHF on CXR; IV lasix given. Dx and testing d/w pt and family.  Questions answered.  Verb understanding, agreeable to admit. T/C to [facility]. Myna Hidalgo, case discussed, including:  HPI, pertinent PM/SHx, VS/PE, dx testing, ED course and treatment:  Agreeable to admit, requests to write temporary orders, obtain tele bed to team APAdmits.    Francine Graven, DO 01/29/16 2147

## 2016-01-28 NOTE — ED Notes (Signed)
PT c/o fever of 101 at SNF since last night with oxygen sats in the 80s. Daughter reports pt was at [facility] 5/14-5/17/17 for cellulitis to left leg and has since been living at [facility] and [facility] for therapy.

## 2016-01-28 NOTE — H&P (Signed)
History and Physical    Theresa H Haffey [id number] DOB: 10-03-1928 DOA: 01/28/2016  PCP: Glenda Chroman, MD   Patient coming from: SNF  Chief Complaint: New O2-requirement  HPI: Theresa Mclean is a medically-complex 82 y.o. female with medical history significant for coronary artery disease with stent, chronic diastolic CHF, hypertension, type 2 diabetes, renal artery stenosis, and chronic kidney disease stage II who presents to the ED from her SNF with reports of fever and desaturations to 70% last night. Patient was reportedly admitted to [facility] with left lower extremity cellulitis and treated there from 01/10/2016 to 01/12/2016 before being discharged to a SNF. She initially did well at the nursing facility until last night, when she was noted to be febrile to 101.0 and with oxygen saturation of 70% on room air. She had not been requiring supplemental oxygen prior to this. Oxygen saturations reportedly recovered well with administration of 2 L/m supplemental oxygen. Patient was evaluated by a clinician at the SNF and there were plans to continue monitoring her there, but the patient's daughter did not agree with this plan and brought her to the ED for evaluation.  ED Course: Upon arrival to the ED, patient is found to be afebrile, saturating well on 2 L/m supplemental oxygen, heart rate of 60, and blood pressure of 122/50. EKG features a sinus rhythm with diffuse T-wave inversions and troponin is mildly elevated to a value of 0.04. BNP is elevated to 1059. Chest x-ray is notable for cardiomegaly and new diffuse interstitial edema and a pattern that is consistent with mild CHF. Chemistry panels notable for serum glucose of 300, BUN of 48, and serum creatinine of 1.88, up from an apparent baseline of close to 1. CBC is notable for a leukocytosis to 14,600 and hemoglobin of 8.5, down from the 12-13 range a year ago, and with MCV of 75.3. DRE was performed with return of normal stool which  was FOBT negative. The left lower extremity was noted to be swollen greater than the right and ultrasound was obtained, negative for DVT. Patient was treated with DuoNeb and Lasix 40 mg IV in the emergency department. She remained hemodynamically stable in the ED and will be admitted to the telemetry unit for ongoing evaluation and management of acute hypoxic respiratory failure suspected secondary to acute on chronic diastolic CHF.  Review of Systems:  All other systems reviewed and apart from HPI, are negative.  Past Medical History  Diagnosis Date  . Hypertension   . Nephrolithiasis   . GERD (gastroesophageal reflux disease)   . Arthritis   . Psoriasis   . Morbid obesity ([place])   . Mitral regurgitation   . Hyperlipidemia   . Sleep apnea     uses cpap  . CHF (congestive heart failure) (HCC)     Diastolic. EF 55-65% by cath 10/11/11  . Diabetes mellitus     insulin dependent  . Acute renal insufficiency     09/2011. not on ACEI secondary to this (also has renal artery stenosis)  . Peripheral vascular disease ([place])     severe right renal artery stenosis and left SFA stenosis by PV angio 09/2011, treated medically  . Cellulitis   . CAD (coronary artery disease)     NSTEMI 09/2011 felt poor candidate for CABG, instead s/p PTCA/DES to mid RCA 10/17/11  . Chronic respiratory failure ([place])   . Pulmonary hypertension ([place])   . Obstructive sleep apnea   . CKD (chronic kidney disease) stage 2,  GFR 60-89 ml/min 07/11/2013  . Vitamin D deficiency   . Chronic back pain   . Radiculopathy     lumbar  . Chronic knee pain     Past Surgical History  Procedure Laterality Date  . Acromio-clavicular joint repair  2011  . Abdominal hysterectomy    . Eye surgery      cataract  OD  . Lower extremity angiogram N/A 10/11/2011    Procedure: LOWER EXTREMITY ANGIOGRAM;  Surgeon: Sherren Mocha, MD;  Location: [facility] CATH LAB;  Service: Cardiovascular;  Laterality: N/A;  . Left and right heart  catheterization with coronary angiogram N/A 10/11/2011    Procedure: LEFT AND RIGHT HEART CATHETERIZATION WITH CORONARY ANGIOGRAM;  Surgeon: Sherren Mocha, MD;  Location: [facility] CATH LAB;  Service: Cardiovascular;  Laterality: N/A;  . Percutaneous coronary stent intervention (pci-s) N/A 10/17/2011    Procedure: PERCUTANEOUS CORONARY STENT INTERVENTION (PCI-S);  Surgeon: Burnell Blanks, MD;  Location: [facility] CATH LAB;  Service: Cardiovascular;  Laterality: N/A;     reports that she has never smoked. She has never used smokeless tobacco. She reports that she does not drink alcohol or use illicit drugs.  Allergies  Allergen Reactions  . Ivp Dye [Iodinated Diagnostic Agents] Anaphylaxis  . Rocephin [Ceftriaxone] Diarrhea and Nausea And Vomiting    Family History  Problem Relation Age of Onset  . Psoriasis Brother      Prior to Admission medications   Medication Sig Start Date End Date Taking? Authorizing Provider  acetaminophen (TYLENOL) 325 MG tablet Take 650 mg by mouth every 6 (six) hours as needed.   Yes Historical Provider, MD  amLODipine (NORVASC) 10 MG tablet TAKE 1 TABLET (10 MG TOTAL) BY MOUTH DAILY. 01/26/16  Yes Herminio Commons, MD  aspirin 325 MG tablet Take 325 mg by mouth daily.   Yes Historical Provider, MD  atorvastatin (LIPITOR) 20 MG tablet Take 1 tablet (20 mg total) by mouth at bedtime. 05/20/15  Yes Imogene Burn, PA-C  carbidopa-levodopa (SINEMET IR) 25-100 MG tablet 1 tablet at 8 am, 1 tablet at 12 pm, 1 tablet at 5 pm 09/08/15  Yes Rebecca S Tat, DO  fexofenadine (ALLEGRA ALLERGY) 180 MG tablet Take 180 mg by mouth daily.   Yes Historical Provider, MD  folic acid (FOLVITE) 1 MG tablet TAKE 1 TABLET BY MOUTH DAILY. 11/25/15  Yes Herminio Commons, MD  furosemide (LASIX) 40 MG tablet TAKE 1-3 TABLETS (40-120 MG TOTAL) BY MOUTH DAILY. BASED ON DEGREE OF SWELLING/FLUID BUILDUP 12/02/15  Yes Herminio Commons, MD  gabapentin (NEURONTIN) 100 MG capsule Take 2 capsules  (200 mg total) by mouth 2 (two) times daily. Patient taking differently: Take 100 mg by mouth 2 (two) times daily.  07/14/14  Yes Geradine Girt, DO  hydrALAZINE (APRESOLINE) 50 MG tablet TAKE 1 TABLET BY MOUTH 2 TIMES DAILY. 01/26/16  Yes Herminio Commons, MD  Menthol-Zinc Oxide (CALMOSEPTINE) 0.44-20.6 % OINT Apply 1 application topically 3 (three) times daily. Apply to groin area for protective barrier.   Yes Historical Provider, MD  metFORMIN (GLUCOPHAGE) 500 MG tablet Take 500 mg by mouth 2 (two) times daily with a meal.   Yes Historical Provider, MD  Multiple Vitamins-Minerals (MULTIVITAMINS THER. W/MINERALS) TABS tablet Take 1 tablet by mouth daily.   Yes Historical Provider, MD  nystatin (NYSTATIN) powder Apply 1 g topically 4 (four) times daily.   Yes Historical Provider, MD  omeprazole (PRILOSEC) 20 MG capsule Take 20 mg by mouth daily.  Yes Historical Provider, MD  promethazine (PHENERGAN) 12.5 MG tablet Take 12.5 mg by mouth every 6 (six) hours as needed for nausea or vomiting.   Yes Historical Provider, MD  sodium bicarbonate 325 MG tablet Take 325 mg by mouth 2 (two) times daily.   Yes Historical Provider, MD  spironolactone (ALDACTONE) 25 MG tablet TAKE 1 TABLET BY MOUTH DAILY. 01/26/16  Yes Herminio Commons, MD    Physical Exam: Filed Vitals:   01/28/16 1800 01/28/16 1815 01/28/16 1830 01/28/16 1845  BP: 115/49 115/55 126/45 122/50  Pulse: 59 60 64   Temp:      TempSrc:      Resp:      Height:      Weight:      SpO2: 100% 95% 93%       Constitutional: Calm, comfortable. Chronically-ill in appearance.  Eyes: PERTLA, lids and conjunctivae normal ENMT: Mucous membranes are moist. Posterior pharynx clear of any exudate or lesions.   Neck: normal, supple, no masses, no thyromegaly Respiratory: Bibasilar crackles. Normal respiratory effort. No accessory muscle use.  Cardiovascular: S1 & S2 heard, regular rate and rhythm, soft systolic murmur at upper sternal borders.  Pitting edema of b/l LEs, Lt > Rt. Abdomen: No distension, no tenderness, no masses palpated. Bowel sounds normal.  Musculoskeletal: no clubbing / cyanosis. No joint deformity upper and lower extremities. Normal muscle tone.  Skin: Hyperpigmentation at BLE distally in gaiter distribution with healed ulcerations. Warm, dry, well-perfused. Neurologic: CN 2-12 grossly intact. Sensation intact, DTR normal. Strength 5/5 in all 4 limbs.  Psychiatric: Normal judgment and insight. Alert and oriented x 3. Normal mood and affect.     Labs on Admission: I have personally reviewed following labs and imaging studies  CBC:  Recent Labs Lab 01/28/16 1514  WBC 14.6*  NEUTROABS 11.7*  HGB 8.5*  HCT 27.4*  MCV 75.3*  PLT [id number]   Basic Metabolic Panel:  Recent Labs Lab 01/28/16 1514  NA 129*  K 5.1  CL 94*  CO2 25  GLUCOSE 300*  BUN 48*  CREATININE 1.88*  CALCIUM 9.3   GFR: Estimated Creatinine Clearance: 20.2 mL/min (by C-G formula based on Cr of 1.88). Liver Function Tests:  Recent Labs Lab 01/28/16 1514  AST 19  ALT 8*  ALKPHOS 85  BILITOT 0.6  PROT 8.0  ALBUMIN 3.0*    Recent Labs Lab 01/28/16 1514  LIPASE 16   No results for input(s): AMMONIA in the last 168 hours. Coagulation Profile: No results for input(s): INR, PROTIME in the last 168 hours. Cardiac Enzymes:  Recent Labs Lab 01/28/16 1514  TROPONINI 0.04*   BNP (last 3 results) No results for input(s): PROBNP in the last 8760 hours. HbA1C: No results for input(s): HGBA1C in the last 72 hours. CBG:  Recent Labs Lab 01/28/16 1650  GLUCAP 227*   Lipid Profile: No results for input(s): CHOL, HDL, LDLCALC, TRIG, CHOLHDL, LDLDIRECT in the last 72 hours. Thyroid Function Tests: No results for input(s): TSH, T4TOTAL, FREET4, T3FREE, THYROIDAB in the last 72 hours. Anemia Panel: No results for input(s): VITAMINB12, FOLATE, FERRITIN, TIBC, IRON, RETICCTPCT in the last 72 hours. Urine analysis:      Component Value Date/Time   COLORURINE YELLOW 01/28/2016 1500   APPEARANCEUR CLEAR 01/28/2016 1500   LABSPEC 1.010 01/28/2016 1500   PHURINE 6.5 01/28/2016 1500   GLUCOSEU NEGATIVE 01/28/2016 1500   HGBUR NEGATIVE 01/28/2016 1500   BILIRUBINUR NEGATIVE 01/28/2016 1500   KETONESUR NEGATIVE 01/28/2016 1500  PROTEINUR NEGATIVE 01/28/2016 1500   UROBILINOGEN 0.2 11/05/2014 0221   NITRITE NEGATIVE 01/28/2016 1500   LEUKOCYTESUR NEGATIVE 01/28/2016 1500   Sepsis Labs: @LABRCNTIP (procalcitonin:4,lacticidven:4) )No results found for this or any previous visit (from the past 240 hour(s)).   Radiological Exams on Admission: Dg Chest 2 View  01/28/2016  CLINICAL DATA:  Fever, hypoxia EXAM: CHEST  2 VIEW COMPARISON:  02/23/2015 FINDINGS: Right subclavian port catheter tip proximal SVC as before. Heart is enlarged. Diffuse increased vascular congestion and interstitial opacities with basilar atelectasis, suspect developing CHF. No large effusion. Limited lateral views because of technique and positioning. Trachea is midline. Atherosclerosis of the aorta. Degenerative changes of the spine and shoulders. IMPRESSION: Cardiomegaly with new diffuse interstitial edema pattern and basilar atelectasis, suspect mild CHF. Electronically Signed   By: Jerilynn Mages.  Shick M.D.   On: 01/28/2016 16:07   US Venous Img Lower Unilateral Left  01/28/2016  CLINICAL DATA:  Left lower extremity edema EXAM: LEFT LOWER EXTREMITY VENOUS DUPLEX ULTRASOUND TECHNIQUE: Gray-scale sonography with graded compression, as well as color Doppler and duplex ultrasound were performed to evaluate the left lower extremity deep venous system from the level of the common femoral vein and including the common femoral, femoral, profunda femoral, popliteal and calf veins including the posterior tibial, peroneal and gastrocnemius veins when visible. The superficial great saphenous vein was also interrogated. Spectral Doppler was utilized to evaluate flow at  rest and with distal augmentation maneuvers in the common femoral, femoral and popliteal veins. COMPARISON:  None. FINDINGS: Contralateral Common Femoral Vein: Respiratory phasicity is normal and symmetric with the symptomatic side. No evidence of thrombus. Normal compressibility. Common Femoral Vein: No evidence of thrombus. Normal compressibility, respiratory phasicity and response to augmentation. Saphenofemoral Junction: No evidence of thrombus. Normal compressibility and flow on color Doppler imaging. Profunda Femoral Vein: No evidence of thrombus. Normal compressibility and flow on color Doppler imaging. Femoral Vein: No evidence of thrombus. Normal compressibility, respiratory phasicity and response to augmentation. Popliteal Vein: No evidence of thrombus. Normal compressibility, respiratory phasicity and response to augmentation. Calf Veins: No evidence of thrombus. Normal compressibility and flow on color Doppler imaging. Superficial Great Saphenous Vein: No evidence of thrombus. Normal compressibility and flow on color Doppler imaging. Venous Reflux:  None. Other Findings:  There is extensive left lower extremity edema. IMPRESSION: No evidence of left lower extremity deep venous thrombosis. Right common femoral vein also patent. There is left lower extremity soft tissue edema noted. Electronically Signed   By: Lowella Grip III M.D.   On: 01/28/2016 15:46    EKG: Independently reviewed. Sinus rhythm, TWI's diffusely   Assessment/Plan  1. Acute hypoxic respiratory failure  - Reportedly saturating in 70s at SNF last night; 86% on arrival here while on rm air  - Sat normalized with 2 Lpm supplemental O2  - Suspected secondary to acute on chronic diastolic CHF  - Treating CHF as below - Wean supplemental O2 as tolerates   2. Acute on chronic diastolic CHF  - Pulmonary edema noted on CXR, peripheral edema on exam  - BNP 1059 on admission  - Given Lasix 40 mg IV in ED; plan to continue with  40 mg IV BID - Follow strict I/Os, daily wts; SLIV, fluid-restrict diet \ - Follow renal function and electrolytes with qAM BMP    3. Microcytic anemia  - Hgb 8.5 on admission, down from apparent baseline of 12-13 range; MCV is 75.3  - No sign of active blood-loss; FOBT negative in ED  -  Type and screen  - Repeat H&H overnight  - Check iron studies, B12, folate, supplement prn    4. AKI on CKD stage II - SCr 1.88 on admission, up from apparent baseline of ~1  - Uncertain etiology - Urine studies ordered for further evaluation  - Will require close monitoring with daily chem panel during diuresis   5. Hypertension - Running on the low side at time of admission  - Hold her hydralazine and Norvasc for now so that she can tolerate diuresis, resume as appropriate   6. Type II DM  - A1c 8.2% in November 2015, reflecting poor glycemic-control at that time  - Managed with metformin in the outpatient setting, will hold while in hospital  - Check CBG with meals and qHS  - Moderate-intensity SSI, adjust prn  - Update A1c, pending   7. CAD - Had DES to RCA in 2013  - Managed with ASA 325, Lipitor  - No anginal complaints at time of admission - Initial troponin 0.04, second is 0.03  - Admission EKG with diffuse TWI's, will repeat  - Monitor on telemetry for ischemic changes   8. Leukocytosis  - WBC 14,600 on admission with neutrophilic predominance  - CXR with no apparent PNA, UA not suggestive of infection, no GI complaints  - Completed treatment for LLE cellulitis almost 2 wks ago; there is no erythema or heat the distal LLE, but remains significantly edematous  - Urine cultures incubating; obtain blood cultures if fevers develop  - Check procalcitonin  - Watching off of abx for now    9. GERD  - Symptomatic earlier today, received IV Pepcid in ED  - Uses daily Prilosec at home, will continue PPI with Protonix while in hospital  - No EGD results on file to determine presence of  esophagitis or Barrett's  10. Parkinson's disease  - Appears to be stable  - Continue current-dose Sinemet     DVT prophylaxis: sq heparin Code Status: Full  Family Communication: Daughter and granddaughter updated at bedside  Disposition Plan: Admit to telemetry Consults called: None  Admission status: Inpatient   Vianne Bulls, MD [facility] Pager [phone]  If 7PM-7AM, please contact night-coverage [email or web address] Password [email or web address]  01/28/2016, 7:30 PM

## 2016-01-28 NOTE — Telephone Encounter (Signed)
Spoke with daughter and she said that patient is currently inpatient at [facility] in [place]. Per daughter, she was contacted by the nurse at the facility because patient's O2 saturation dropped to 70% and also had a fever of 101.0. 2L of O2 was placed on patient and her O2 sat normalized. Patient's current O2 Saturation is ranging around 94-95% on 2L O2 via [place]. Per daughter, around 11:00 am today, patient c/o perfuse sweating that resolved on its own and is now c/o epigastric pain, that feels like its going into her back and then into left arm. Patient was given medication for indigestion by nurse at the facility and patient said that it helped some. Patient c/o active chest pain rated 10/10. No dizziness or sob. Patient's daughter instructed that patient needed to go to the nearest ED for an evaluation but she needed to coordinate this with the nurse at the facility. Daughter verbalized understanding of plan.

## 2016-01-29 ENCOUNTER — Other Ambulatory Visit: Payer: Self-pay | Admitting: Physician Assistant

## 2016-01-29 ENCOUNTER — Inpatient Hospital Stay (HOSPITAL_COMMUNITY): Payer: Medicare Other

## 2016-01-29 DIAGNOSIS — J9601 Acute respiratory failure with hypoxia: Secondary | ICD-10-CM

## 2016-01-29 DIAGNOSIS — I509 Heart failure, unspecified: Secondary | ICD-10-CM

## 2016-01-29 LAB — BASIC METABOLIC PANEL
Anion gap: 9 (ref 5–15)
BUN: 46 mg/dL — AB (ref 6–20)
CHLORIDE: 99 mmol/L — AB (ref 101–111)
CO2: 27 mmol/L (ref 22–32)
CREATININE: 1.68 mg/dL — AB (ref 0.44–1.00)
Calcium: 9.4 mg/dL (ref 8.9–10.3)
GFR calc Af Amer: 31 mL/min — ABNORMAL LOW (ref >60)
GFR calc non Af Amer: 27 mL/min — ABNORMAL LOW (ref >60)
GLUCOSE: 107 mg/dL — AB (ref 65–99)
Potassium: 4.6 mmol/L (ref 3.5–5.1)
SODIUM: 135 mmol/L (ref 135–145)

## 2016-01-29 LAB — TROPONIN I
TROPONIN I: 0.03 ng/mL (ref <0.031)
Troponin I: 0.03 ng/mL (ref <0.031)

## 2016-01-29 LAB — HEMOGLOBIN AND HEMATOCRIT, BLOOD
HCT: 24.3 % — ABNORMAL LOW (ref 36.0–46.0)
HCT: 25.2 % — ABNORMAL LOW (ref 36.0–46.0)
HEMOGLOBIN: 7.7 g/dL — AB (ref 12.0–15.0)
Hemoglobin: 7.8 g/dL — ABNORMAL LOW (ref 12.0–15.0)

## 2016-01-29 LAB — OSMOLALITY, URINE: OSMOLALITY UR: 312 mosm/kg (ref 300–900)

## 2016-01-29 LAB — GLUCOSE, CAPILLARY
GLUCOSE-CAPILLARY: 107 mg/dL — AB (ref 65–99)
GLUCOSE-CAPILLARY: 138 mg/dL — AB (ref 65–99)
Glucose-Capillary: 160 mg/dL — ABNORMAL HIGH (ref 65–99)
Glucose-Capillary: 172 mg/dL — ABNORMAL HIGH (ref 65–99)

## 2016-01-29 LAB — MRSA PCR SCREENING: MRSA by PCR: NEGATIVE

## 2016-01-29 LAB — ECHOCARDIOGRAM COMPLETE
Height: 65 in
WEIGHTICAEL: 2425.6 [oz_av]

## 2016-01-29 LAB — IRON AND TIBC
Iron: 13 ug/dL — ABNORMAL LOW (ref 28–170)
SATURATION RATIOS: 7 % — AB (ref 10.4–31.8)
TIBC: 182 ug/dL — AB (ref 250–450)
UIBC: 169 ug/dL

## 2016-01-29 LAB — VITAMIN B12: VITAMIN B 12: 195 pg/mL (ref 180–914)

## 2016-01-29 LAB — FERRITIN: Ferritin: 349 ng/mL — ABNORMAL HIGH (ref 11–307)

## 2016-01-29 LAB — MAGNESIUM: Magnesium: 2.2 mg/dL (ref 1.7–2.4)

## 2016-01-29 LAB — CREATININE, URINE, RANDOM: CREATININE, URINE: 49.92 mg/dL

## 2016-01-29 LAB — NA AND K (SODIUM & POTASSIUM), RAND UR
POTASSIUM UR: 33 mmol/L
SODIUM UR: 68 mmol/L

## 2016-01-29 LAB — BRAIN NATRIURETIC PEPTIDE: B NATRIURETIC PEPTIDE 5: 631 pg/mL — AB (ref 0.0–100.0)

## 2016-01-29 MED ORDER — CETYLPYRIDINIUM CHLORIDE 0.05 % MT LIQD
7.0000 mL | Freq: Two times a day (BID) | OROMUCOSAL | Status: DC
  Administered 2016-01-29 – 2016-01-30 (×3): 7 mL via OROMUCOSAL

## 2016-01-29 MED ORDER — ENOXAPARIN SODIUM 30 MG/0.3ML ~~LOC~~ SOLN
30.0000 mg | SUBCUTANEOUS | Status: DC
  Administered 2016-01-29: 30 mg via SUBCUTANEOUS
  Filled 2016-01-29: qty 0.3

## 2016-01-29 NOTE — NC FL2 (Signed)
[place] MEDICAID FL2 LEVEL OF CARE SCREENING TOOL     IDENTIFICATION  Patient Name: Theresa Mclean Birthdate: [birth year] Sex: female Admission Date (Current Location): 01/28/2016  [facility] and [place] Number:  [employer] and Address:  [facility] [address] [place]      Provider Number: [id number]  Attending Physician Name and Address:  Koleen Nimrod Acost*  Relative Name and Phone Number:       Current Level of Care: Hospital Recommended Level of Care: [place] Prior Approval Number:    Date Approved/Denied:   PASRR Number:    Discharge Plan: SNF    Current Diagnoses: Patient Active Problem List   Diagnosis Date Noted  . Acute on chronic diastolic CHF (congestive heart failure) ([place]) 01/28/2016  . Acute respiratory failure with hypoxia ([place]) 01/28/2016  . Microcytic anemia 01/28/2016  . AKI (acute kidney injury) ([place]) 01/28/2016  . Leukocytosis 01/28/2016  . GERD (gastroesophageal reflux disease) 01/28/2016  . Parkinson's disease ([place]) 01/28/2016  . Acute on chronic congestive heart failure ([place])   . Renal insufficiency   . Critical lower limb ischemia   . Diabetic ulcer of left foot associated with diabetes mellitus due to underlying condition ([place])   . Diabetic ulcer of left foot associated with other specified diabetes mellitus ([place])   . Iron deficiency 11/25/2014  . MGUS (monoclonal gammopathy of unknown significance) 11/25/2014  . Abdominal pain   . CN (constipation)   . Dehydration 11/04/2014  . Pain in the chest   . Chest pain 07/10/2014  . Diabetes ([place]) 07/08/2014  . Essential hypertension 07/08/2014  . Acute combined systolic and diastolic congestive heart failure ([place]) 07/08/2014  . Hyperkalemia 07/08/2014  . Chronic kidney disease 07/08/2014  . Dermatitis seborrheica 11/25/2013  . LBP (low back pain) 11/16/2013  . Gonalgia 11/16/2013  . Chronic pain associated with significant  psychosocial dysfunction 11/16/2013  . Nerve root disorder 11/16/2013  . Acute diastolic congestive heart failure ([place]) 08/10/2013  . Stasis, venous 08/01/2013  . CKD (chronic kidney disease) stage 2, GFR 60-89 ml/min 07/11/2013  . Acute on chronic diastolic heart failure ([place]) 07/10/2013  . DM (diabetes mellitus), type 2, uncontrolled, with renal complications ([place]) [id number]  . Anemia 07/10/2013  . Chronic respiratory failure ([place]) 07/10/2013  . Pulmonary hypertension ([place]) 07/10/2013  . Psoriasis 02/28/2012  . Obstructive sleep apnea 02/18/2012  . CAD (coronary artery disease) 10/28/2011  . HTN (hypertension) 10/28/2011  . HLD (hyperlipidemia) 10/28/2011  . Carotid artery disease ([place]) 10/28/2011  . PAD (peripheral artery disease) ([place]) 10/28/2011  . MR (mitral regurgitation) 10/28/2011  . Chronic diastolic heart failure ([place]) 10/28/2011  . Bradycardia 10/28/2011  . Cardiac conduction disorder 10/28/2011  . Carotid arterial disease ([place]) 10/28/2011  . Arteriosclerosis of coronary artery 10/28/2011  . Disorder of mitral valve 10/28/2011  . Peripheral blood vessel disorder ([place]) 10/28/2011  . Acute myocardial infarction, subendocardial infarction, initial episode of care ([place]) 10/12/2011  . Diabetes mellitus ([place]) 10/12/2011    Orientation RESPIRATION BLADDER Height & Weight     Self, Place  O2 (2 L) Incontinent Weight: 151 lb 9.6 oz (68.765 kg) Height:  5\' 5"  (165.1 cm)  BEHAVIORAL SYMPTOMS/MOOD NEUROLOGICAL BOWEL NUTRITION STATUS  Other (Comment) (n/a)  (n/a) Incontinent Diet (Heart healthy/carb modified  1500 mL fluid restriction)  AMBULATORY STATUS COMMUNICATION OF NEEDS Skin   Extensive Assist Verbally Normal  Personal Care Assistance Level of Assistance  Bathing, Feeding, Dressing Bathing Assistance: Maximum assistance Feeding assistance: Limited assistance Dressing Assistance: Maximum assistance     Functional Limitations Info  Sight,  Hearing, Speech Sight Info: Impaired Hearing Info: Adequate Speech Info: Adequate    SPECIAL CARE FACTORS FREQUENCY                       Contractures Contractures Info: Not present    Additional Factors Info  Insulin Sliding Scale Code Status Info: Full code Allergies Info: Ivp Dye, Rocephin     Isolation Precautions Info: 11/05/14 mrsa by pcr     Current Medications (01/29/2016):  This is the current hospital active medication list Current Facility-Administered Medications  Medication Dose Route Frequency Provider Last Rate Last Dose  . 0.9 %  sodium chloride infusion  250 mL Intravenous PRN Ilene Qua Opyd, MD      . 0.9 %  sodium chloride infusion   Intravenous Once Vianne Bulls, MD      . acetaminophen (TYLENOL) tablet 650 mg  650 mg Oral Q4H PRN Vianne Bulls, MD      . antiseptic oral rinse (CPC / CETYLPYRIDINIUM CHLORIDE 0.05%) solution 7 mL  7 mL Mouth Rinse BID Erline Hau, MD   7 mL at 01/29/16 0921  . aspirin tablet 325 mg  325 mg Oral Daily Vianne Bulls, MD   325 mg at 01/29/16 [id number]  . atorvastatin (LIPITOR) tablet 20 mg  20 mg Oral QHS Vianne Bulls, MD   20 mg at 01/28/16 2136  . carbidopa-levodopa (SINEMET IR) 25-100 MG per tablet immediate release 1 tablet  1 tablet Oral TID Vianne Bulls, MD   1 tablet at [id number]  . folic acid (FOLVITE) tablet 1 mg  1 mg Oral Daily Vianne Bulls, MD   1 mg at 01/29/16 0921  . furosemide (LASIX) injection 40 mg  40 mg Intravenous BID Vianne Bulls, MD   40 mg at 01/29/16 0919  . gabapentin (NEURONTIN) capsule 100 mg  100 mg Oral BID Vianne Bulls, MD   100 mg at 01/29/16 0921  . heparin injection 5,000 Units  5,000 Units Subcutaneous Q8H Vianne Bulls, MD   5,000 Units at 01/28/16 2136  . insulin aspart (novoLOG) injection 0-15 Units  0-15 Units Subcutaneous TID WC Vianne Bulls, MD   0 Units at 01/29/16 0758  . multivitamin with minerals tablet 1 tablet  1 tablet Oral Daily Vianne Bulls, MD   1  tablet at 01/29/16 0921  . ondansetron (ZOFRAN) injection 4 mg  4 mg Intravenous Q6H PRN Vianne Bulls, MD      . pantoprazole (PROTONIX) EC tablet 40 mg  40 mg Oral Daily Vianne Bulls, MD   40 mg at 01/29/16 0921  . promethazine (PHENERGAN) tablet 12.5 mg  12.5 mg Oral Q6H PRN Ilene Qua Opyd, MD      . sodium chloride flush (NS) 0.9 % injection 3 mL  3 mL Intravenous Q12H Vianne Bulls, MD   3 mL at 01/29/16 0921  . sodium chloride flush (NS) 0.9 % injection 3 mL  3 mL Intravenous PRN Vianne Bulls, MD      . spironolactone (ALDACTONE) tablet 25 mg  25 mg Oral Daily Vianne Bulls, MD   25 mg at 01/29/16 [id number]     Discharge Medications: Please see discharge summary for a list of  discharge medications.  Relevant Imaging Results:  Relevant Lab Results:   Additional Information    Salome Arnt, [place]

## 2016-01-29 NOTE — Progress Notes (Signed)
ANTICOAGULATION CONSULT NOTE - Initial Consult  Pharmacy Consult for LOVENOX Indication: VTE prophylaxis  Allergies  Allergen Reactions  . Ivp Dye [Iodinated Diagnostic Agents] Anaphylaxis  . Rocephin [Ceftriaxone] Diarrhea and Nausea And Vomiting    Patient Measurements: Height: 5\' 5"  (165.1 cm) Weight: 151 lb 9.6 oz (68.765 kg) IBW/kg (Calculated) : 57  Vital Signs: Temp: 98.1 F (36.7 C) (06/02 1345) Temp Source: Oral (06/02 1345) BP: 118/40 mmHg (06/02 1345) Pulse Rate: 68 (06/02 1345)  Labs:  Recent Labs  01/28/16 1514 01/28/16 1900 01/28/16 2110 01/29/16 0130 01/29/16 0448 01/29/16 0735 01/29/16 1045  HGB 8.5*  --  7.8*  --  7.7*  --  7.8*  HCT 27.4*  --  25.1*  --  24.3*  --  25.2*  PLT 281  --   --   --   --   --   --   CREATININE 1.88*  --   --   --  1.68*  --   --   TROPONINI 0.04* 0.03  --  0.03  --  0.03  --     Estimated Creatinine Clearance: 23.8 mL/min (by C-G formula based on Cr of 1.68).   Medical History: Past Medical History  Diagnosis Date  . Hypertension   . Nephrolithiasis   . GERD (gastroesophageal reflux disease)   . Arthritis   . Psoriasis   . Morbid obesity ([place])   . Mitral regurgitation   . Hyperlipidemia   . Sleep apnea     uses cpap  . CHF (congestive heart failure) (HCC)     Diastolic. EF 55-65% by cath 10/11/11  . Diabetes mellitus     insulin dependent  . Acute renal insufficiency     09/2011. not on ACEI secondary to this (also has renal artery stenosis)  . Peripheral vascular disease ([place])     severe right renal artery stenosis and left SFA stenosis by PV angio 09/2011, treated medically  . Cellulitis   . CAD (coronary artery disease)     NSTEMI 09/2011 felt poor candidate for CABG, instead s/p PTCA/DES to mid RCA 10/17/11  . Chronic respiratory failure ([place])   . Pulmonary hypertension ([place])   . Obstructive sleep apnea   . CKD (chronic kidney disease) stage 2, GFR 60-89 ml/min 07/11/2013  . Vitamin D deficiency   .  Chronic back pain   . Radiculopathy     lumbar  . Chronic knee pain    Assessment: 82yo female.  ClCr < 60ml/min.  Asked to initiate Lovenox for VTE px.  Pt received SQ Heparin dose earlier today.  Will schedule Lovenox dose 8 hrs later.   H/H low, f/u platelets.    Goal of Therapy:  VTE prophylaxis Monitor platelets by anticoagulation protocol: Yes   Plan:  Lovenox 30mg  SQ q24hrs (renally adjusted, schedule for 8hrs after SQ Heparin dose) Monitor CBC, s/sx of bleeding complications.    Hart Robinsons A 01/29/2016,4:41 PM

## 2016-01-29 NOTE — Clinical Social Work Note (Signed)
CSW spoke with pt's daughter, Thayer Headings who reports plan is for pt to return to [facility]. She is aware of possible weekend d/c.   Benay Pike, [place]

## 2016-01-29 NOTE — Progress Notes (Signed)
*  PRELIMINARY RESULTS* Echocardiogram 2D Echocardiogram has been performed.  Theresa Mclean 01/29/2016, 1:08 PM

## 2016-01-29 NOTE — Clinical Social Work Note (Signed)
Clinical Social Work Assessment  Patient Details  Name: Theresa Mclean MRN: [id number] Date of Birth: 06-16-1929  Date of referral:  01/29/16               Reason for consult:  Discharge Planning                Permission sought to share information with:  Family Supports Permission granted to share information::  Yes, Verbal Permission Granted  Name::     Building surveyor::     Relationship::  daughter  Contact Information:     Housing/Transportation Living arrangements for the past 2 months:  [place] of Information:  Patient, Facility Patient Interpreter Needed:  None Criminal Activity/Legal Involvement Pertinent to Current Situation/Hospitalization:  No - Comment as needed Significant Relationships:  Adult Children Lives with:  Facility Resident Do you feel safe going back to the place where you live?  Yes Need for family participation in patient care:  Yes (Comment)  Care giving concerns:  None noted. Pt is from SNF.    Social Worker assessment / plan:  CSW met with pt at bedside. Pt alert, but oriented to self and place only. She requested that CSW call her daughter, Thayer Headings. Voicemail left requesting return call. Per admissions at [facility], pt is skilled level of care and transferred to rehab mid May from [facility]. Possible weekend d/c. Facility aware and agreeable. They are not sure if pt will be long term at this point. Pt is max assist with ADLs.   Employment status:  Retired Forensic scientist:  Medicare PT Recommendations:  Not assessed at this time [place] / Referral to community resources:  Other (Comment Required) (Return to [facility] )  Patient/Family's Response to care:  CSW will discuss with daughter when able to establish contact, but anticipate return to [facility].   Patient/Family's Understanding of and Emotional Response to Diagnosis, Current Treatment, and Prognosis:  Unable to discuss with pt due to mental  status.   Emotional Assessment Appearance:  Appears stated age Attitude/Demeanor/Rapport:  Unable to Assess Affect (typically observed):  Unable to Assess Orientation:  Oriented to Self, Oriented to Place Alcohol / Substance use:  Not Applicable Psych involvement (Current and /or in the community):  No (Comment)  Discharge Needs  Concerns to be addressed:  Discharge Planning Concerns Readmission within the last 30 days:  No Current discharge risk:  None Barriers to Discharge:  Continued Medical Work up   [employer], [employer], [place] 01/29/2016, 10:03 AM [phone]

## 2016-01-29 NOTE — Progress Notes (Signed)
PROGRESS NOTE    [place] H Cardinal  [id number] DOB: 04/15/1929 DOA: 01/28/2016 PCP: Glenda Chroman, MD     Brief Narrative:  82 year old woman admitted on 6/1 from SNF due to hypoxemia. There was a report of a temperature of 101 and oxygen saturation of 70% in room air. She was admitted for treatment of what is thought to be acute on chronic diastolic CHF.   Assessment & Plan:   Principal Problem:   Acute respiratory failure with hypoxia (HCC) Active Problems:   HTN (hypertension)   HLD (hyperlipidemia)   Carotid artery disease (HCC)   DM (diabetes mellitus), type 2, uncontrolled, with renal complications (HCC)   CKD (chronic kidney disease) stage 2, GFR 60-89 ml/min   Iron deficiency   Acute on chronic diastolic CHF (congestive heart failure) (HCC)   Microcytic anemia   AKI (acute kidney injury) ([place])   Leukocytosis   GERD (gastroesophageal reflux disease)   Parkinson's disease ([place])   Acute on chronic congestive heart failure ([place])   Renal insufficiency   Acute hypoxemic respiratory failure -Improved with oxygen supplementation and diuresis.  Acute on chronic diastolic CHF -Echo: Ejection fraction of 60-65% with normal wall motion, wall thickness was normal. -She is a little over 400 mL negative since admission, volume status is much improved. -Continue Lasix 40 mg IV twice a day for now.  Acute on chronic kidney disease stage II -Baseline and creatinine is about 1. -Was 1.8 on admission, down to 1.6 today.  Hypertension -We'll controlled.  Microcytic anemia/anemia of chronic disease -Hemoglobin stable in the upper 7s. -No indication for transfusion unless hemoglobin drops below 7.  Type 2 diabetes -Fair control, adjust insulin as needed.  Coronary artery disease  -Stable, no chest pain  Parkinson's disease -Stable, continue Sinemet    DVT prophylaxis: Lovenox Code Status: Full code Family Communication: Patient only Disposition Plan: Likely back to  [facility] in 24-48 hours  Consultants:   None  Procedures:   None  Antimicrobials:   None    Subjective: Feels well, states shortness of breath is improved, denies chest pain  Objective: Filed Vitals:   01/28/16 1930 01/28/16 2143 01/29/16 0500 01/29/16 1345  BP: 119/48 126/50 123/47 118/40  Pulse: 65 65 71 68  Temp: 98.7 F (37.1 C) 98.6 F (37 C) 97.7 F (36.5 C) 98.1 F (36.7 C)  TempSrc: Oral Oral Oral Oral  Resp: 17 18 18 18   Height: 5\' 5"  (1.651 m)     Weight: 69.128 kg (152 lb 6.4 oz)  68.765 kg (151 lb 9.6 oz)   SpO2: 95% 96% 100% 95%    Intake/Output Summary (Last 24 hours) at 01/29/16 1626 Last data filed at 01/29/16 1352  Gross per 24 hour  Intake    480 ml  Output    900 ml  Net   -420 ml   Filed Weights   01/28/16 1443 01/28/16 1930 01/29/16 0500  Weight: 67.586 kg (149 lb) 69.128 kg (152 lb 6.4 oz) 68.765 kg (151 lb 9.6 oz)    Examination:  General exam: Alert, awake, oriented x 3 Respiratory system: Clear to auscultation. Respiratory effort normal. Cardiovascular system:RRR. No murmurs, rubs, gallops. Gastrointestinal system: Abdomen is nondistended, soft and nontender. No organomegaly or masses felt. Normal bowel sounds heard. Central nervous system: Alert and oriented. No focal neurological deficits. Extremities: 1+ pitting edema bilaterally Skin: No rashes, lesions or ulcers Psychiatry: Judgement and insight appear normal. Mood & affect appropriate.     Data Reviewed: I  have personally reviewed following labs and imaging studies  CBC:  Recent Labs Lab 01/28/16 1514 01/28/16 2110 01/29/16 0448 01/29/16 1045  WBC 14.6*  --   --   --   NEUTROABS 11.7*  --   --   --   HGB 8.5* 7.8* 7.7* 7.8*  HCT 27.4* 25.1* 24.3* 25.2*  MCV 75.3*  --   --   --   PLT 281  --   --   --    Basic Metabolic Panel:  Recent Labs Lab 01/28/16 1514 01/29/16 0448  NA 129* 135  K 5.1 4.6  CL 94* 99*  CO2 25 27  GLUCOSE 300* 107*  BUN 48* 46*    CREATININE 1.88* 1.68*  CALCIUM 9.3 9.4  MG  --  2.2   GFR: Estimated Creatinine Clearance: 23.8 mL/min (by C-G formula based on Cr of 1.68). Liver Function Tests:  Recent Labs Lab 01/28/16 1514  AST 19  ALT 8*  ALKPHOS 85  BILITOT 0.6  PROT 8.0  ALBUMIN 3.0*    Recent Labs Lab 01/28/16 1514  LIPASE 16   No results for input(s): AMMONIA in the last 168 hours. Coagulation Profile: No results for input(s): INR, PROTIME in the last 168 hours. Cardiac Enzymes:  Recent Labs Lab 01/28/16 1514 01/28/16 1900 01/29/16 0130 01/29/16 0735  TROPONINI 0.04* 0.03 0.03 0.03   BNP (last 3 results) No results for input(s): PROBNP in the last 8760 hours. HbA1C: No results for input(s): HGBA1C in the last 72 hours. CBG:  Recent Labs Lab 01/28/16 1650 01/28/16 2051 01/29/16 0743 01/29/16 1113  GLUCAP 227* 181* 107* 160*   Lipid Profile: No results for input(s): CHOL, HDL, LDLCALC, TRIG, CHOLHDL, LDLDIRECT in the last 72 hours. Thyroid Function Tests: No results for input(s): TSH, T4TOTAL, FREET4, T3FREE, THYROIDAB in the last 72 hours. Anemia Panel:  Recent Labs  01/28/16 2110  VITAMINB12 195  FERRITIN 349*  TIBC 182*  IRON 13*   Urine analysis:    Component Value Date/Time   COLORURINE YELLOW 01/28/2016 1500   APPEARANCEUR CLEAR 01/28/2016 1500   LABSPEC 1.010 01/28/2016 1500   PHURINE 6.5 01/28/2016 1500   GLUCOSEU NEGATIVE 01/28/2016 1500   HGBUR NEGATIVE 01/28/2016 1500   BILIRUBINUR NEGATIVE 01/28/2016 1500   KETONESUR NEGATIVE 01/28/2016 1500   PROTEINUR NEGATIVE 01/28/2016 1500   UROBILINOGEN 0.2 11/05/2014 0221   NITRITE NEGATIVE 01/28/2016 1500   LEUKOCYTESUR NEGATIVE 01/28/2016 1500   Sepsis Labs: @LABRCNTIP (procalcitonin:4,lacticidven:4)  ) Recent Results (from the past 240 hour(s))  MRSA PCR Screening     Status: None   Collection Time: 01/28/16  9:34 PM  Result Value Ref Range Status   MRSA by PCR NEGATIVE NEGATIVE Final    Comment:         The GeneXpert MRSA Assay (FDA approved for NASAL specimens only), is one component of a comprehensive MRSA colonization surveillance program. It is not intended to diagnose MRSA infection nor to guide or monitor treatment for MRSA infections.          Radiology Studies: Dg Chest 2 View  01/28/2016  CLINICAL DATA:  Fever, hypoxia EXAM: CHEST  2 VIEW COMPARISON:  02/23/2015 FINDINGS: Right subclavian port catheter tip proximal SVC as before. Heart is enlarged. Diffuse increased vascular congestion and interstitial opacities with basilar atelectasis, suspect developing CHF. No large effusion. Limited lateral views because of technique and positioning. Trachea is midline. Atherosclerosis of the aorta. Degenerative changes of the spine and shoulders. IMPRESSION: Cardiomegaly with new diffuse  interstitial edema pattern and basilar atelectasis, suspect mild CHF. Electronically Signed   By: Jerilynn Mages.  Shick M.D.   On: 01/28/2016 16:07   US Venous Img Lower Unilateral Left  01/28/2016  CLINICAL DATA:  Left lower extremity edema EXAM: LEFT LOWER EXTREMITY VENOUS DUPLEX ULTRASOUND TECHNIQUE: Gray-scale sonography with graded compression, as well as color Doppler and duplex ultrasound were performed to evaluate the left lower extremity deep venous system from the level of the common femoral vein and including the common femoral, femoral, profunda femoral, popliteal and calf veins including the posterior tibial, peroneal and gastrocnemius veins when visible. The superficial great saphenous vein was also interrogated. Spectral Doppler was utilized to evaluate flow at rest and with distal augmentation maneuvers in the common femoral, femoral and popliteal veins. COMPARISON:  None. FINDINGS: Contralateral Common Femoral Vein: Respiratory phasicity is normal and symmetric with the symptomatic side. No evidence of thrombus. Normal compressibility. Common Femoral Vein: No evidence of thrombus. Normal  compressibility, respiratory phasicity and response to augmentation. Saphenofemoral Junction: No evidence of thrombus. Normal compressibility and flow on color Doppler imaging. Profunda Femoral Vein: No evidence of thrombus. Normal compressibility and flow on color Doppler imaging. Femoral Vein: No evidence of thrombus. Normal compressibility, respiratory phasicity and response to augmentation. Popliteal Vein: No evidence of thrombus. Normal compressibility, respiratory phasicity and response to augmentation. Calf Veins: No evidence of thrombus. Normal compressibility and flow on color Doppler imaging. Superficial Great Saphenous Vein: No evidence of thrombus. Normal compressibility and flow on color Doppler imaging. Venous Reflux:  None. Other Findings:  There is extensive left lower extremity edema. IMPRESSION: No evidence of left lower extremity deep venous thrombosis. Right common femoral vein also patent. There is left lower extremity soft tissue edema noted. Electronically Signed   By: Lowella Grip III M.D.   On: 01/28/2016 15:46        Scheduled Meds: . sodium chloride   Intravenous Once  . antiseptic oral rinse  7 mL Mouth Rinse BID  . aspirin  325 mg Oral Daily  . atorvastatin  20 mg Oral QHS  . carbidopa-levodopa  1 tablet Oral TID  . folic acid  1 mg Oral Daily  . furosemide  40 mg Intravenous BID  . gabapentin  100 mg Oral BID  . heparin  5,000 Units Subcutaneous Q8H  . insulin aspart  0-15 Units Subcutaneous TID WC  . multivitamin with minerals  1 tablet Oral Daily  . pantoprazole  40 mg Oral Daily  . sodium chloride flush  3 mL Intravenous Q12H  . spironolactone  25 mg Oral Daily   Continuous Infusions:    LOS: 1 day    Time spent: 25 minutes. Greater than 50% of this time was spent in direct contact with the patient coordinating care.     Lelon Frohlich, MD [facility] Pager [phone]  If 7PM-7AM, please contact  night-coverage [email or web address] Password [facility] 01/29/2016, 4:26 PM

## 2016-01-29 NOTE — Telephone Encounter (Signed)
Please advise 

## 2016-01-29 NOTE — Care Management Note (Signed)
Case Management Note  Patient Details  Name: Theresa Mclean MRN: [id number] Date of Birth: [birth year]  Subjective/Objective:  Patient from [place] center and plan is to return at discharge. CSW is following.                  Action/Plan: Return to SNF   Expected Discharge Date:                  Expected Discharge Plan:  [name]  In-House Referral:  Clinical Social Work  Discharge planning Services  CM Consult  Post Acute Care Choice:  Resumption of Svcs/PTA Provider Choice offered to:     DME Arranged:    DME Agency:     HH Arranged:    [place] Agency:     Status of Service:  Completed, signed off  Medicare Important Message Given:  Yes Date Medicare IM Given:    Medicare IM give by:    Date Additional Medicare IM Given:    Additional Medicare Important Message give by:     If discussed at [place] of Stay Meetings, dates discussed:    Additional Comments:  Alvie Heidelberg, RN 01/29/2016, 9:13 AM

## 2016-01-29 NOTE — Care Management Important Message (Signed)
Important Message  Patient Details  Name: Theresa Mclean MRN: [id number] Date of Birth: 11-03-1928   Medicare Important Message Given:  Yes    Alvie Heidelberg, RN 01/29/2016, 9:12 AM

## 2016-01-30 DIAGNOSIS — R279 Unspecified lack of coordination: Secondary | ICD-10-CM | POA: Diagnosis not present

## 2016-01-30 DIAGNOSIS — K219 Gastro-esophageal reflux disease without esophagitis: Secondary | ICD-10-CM | POA: Diagnosis not present

## 2016-01-30 DIAGNOSIS — Z7401 Bed confinement status: Secondary | ICD-10-CM | POA: Diagnosis not present

## 2016-01-30 DIAGNOSIS — E0859 Diabetes mellitus due to underlying condition with other circulatory complications: Secondary | ICD-10-CM | POA: Diagnosis not present

## 2016-01-30 DIAGNOSIS — E119 Type 2 diabetes mellitus without complications: Secondary | ICD-10-CM | POA: Diagnosis not present

## 2016-01-30 DIAGNOSIS — F0390 Unspecified dementia without behavioral disturbance: Secondary | ICD-10-CM | POA: Diagnosis not present

## 2016-01-30 DIAGNOSIS — M1990 Unspecified osteoarthritis, unspecified site: Secondary | ICD-10-CM | POA: Diagnosis not present

## 2016-01-30 DIAGNOSIS — D72829 Elevated white blood cell count, unspecified: Secondary | ICD-10-CM | POA: Diagnosis not present

## 2016-01-30 DIAGNOSIS — I509 Heart failure, unspecified: Secondary | ICD-10-CM | POA: Diagnosis not present

## 2016-01-30 DIAGNOSIS — M6281 Muscle weakness (generalized): Secondary | ICD-10-CM | POA: Diagnosis not present

## 2016-01-30 DIAGNOSIS — I5030 Unspecified diastolic (congestive) heart failure: Secondary | ICD-10-CM | POA: Diagnosis not present

## 2016-01-30 DIAGNOSIS — R609 Edema, unspecified: Secondary | ICD-10-CM | POA: Diagnosis not present

## 2016-01-30 DIAGNOSIS — E785 Hyperlipidemia, unspecified: Secondary | ICD-10-CM | POA: Diagnosis not present

## 2016-01-30 DIAGNOSIS — I10 Essential (primary) hypertension: Secondary | ICD-10-CM | POA: Diagnosis not present

## 2016-01-30 DIAGNOSIS — I739 Peripheral vascular disease, unspecified: Secondary | ICD-10-CM | POA: Diagnosis not present

## 2016-01-30 DIAGNOSIS — I2510 Atherosclerotic heart disease of native coronary artery without angina pectoris: Secondary | ICD-10-CM | POA: Diagnosis not present

## 2016-01-30 DIAGNOSIS — J9601 Acute respiratory failure with hypoxia: Secondary | ICD-10-CM | POA: Diagnosis not present

## 2016-01-30 DIAGNOSIS — L03116 Cellulitis of left lower limb: Secondary | ICD-10-CM | POA: Diagnosis not present

## 2016-01-30 DIAGNOSIS — J309 Allergic rhinitis, unspecified: Secondary | ICD-10-CM | POA: Diagnosis not present

## 2016-01-30 DIAGNOSIS — D508 Other iron deficiency anemias: Secondary | ICD-10-CM | POA: Diagnosis not present

## 2016-01-30 DIAGNOSIS — E1140 Type 2 diabetes mellitus with diabetic neuropathy, unspecified: Secondary | ICD-10-CM | POA: Diagnosis not present

## 2016-01-30 DIAGNOSIS — G20 Parkinson's disease: Secondary | ICD-10-CM | POA: Diagnosis not present

## 2016-01-30 DIAGNOSIS — R262 Difficulty in walking, not elsewhere classified: Secondary | ICD-10-CM | POA: Diagnosis not present

## 2016-01-30 DIAGNOSIS — D500 Iron deficiency anemia secondary to blood loss (chronic): Secondary | ICD-10-CM | POA: Diagnosis not present

## 2016-01-30 DIAGNOSIS — L03818 Cellulitis of other sites: Secondary | ICD-10-CM | POA: Diagnosis not present

## 2016-01-30 DIAGNOSIS — M25512 Pain in left shoulder: Secondary | ICD-10-CM | POA: Diagnosis not present

## 2016-01-30 LAB — BASIC METABOLIC PANEL
Anion gap: 9 (ref 5–15)
BUN: 31 mg/dL — AB (ref 6–20)
CALCIUM: 9.9 mg/dL (ref 8.9–10.3)
CHLORIDE: 99 mmol/L — AB (ref 101–111)
CO2: 27 mmol/L (ref 22–32)
CREATININE: 1.25 mg/dL — AB (ref 0.44–1.00)
GFR calc non Af Amer: 38 mL/min — ABNORMAL LOW (ref >60)
GFR, EST AFRICAN AMERICAN: 44 mL/min — AB (ref >60)
Glucose, Bld: 97 mg/dL (ref 65–99)
Potassium: 4.2 mmol/L (ref 3.5–5.1)
SODIUM: 135 mmol/L (ref 135–145)

## 2016-01-30 LAB — CBC
HEMATOCRIT: 25.3 % — AB (ref 36.0–46.0)
HEMOGLOBIN: 7.9 g/dL — AB (ref 12.0–15.0)
MCH: 23.8 pg — AB (ref 26.0–34.0)
MCHC: 31.2 g/dL (ref 30.0–36.0)
MCV: 76.2 fL — ABNORMAL LOW (ref 78.0–100.0)
Platelets: 352 10*3/uL (ref 150–400)
RBC: 3.32 MIL/uL — ABNORMAL LOW (ref 3.87–5.11)
RDW: 18.5 % — AB (ref 11.5–15.5)
WBC: 9.1 10*3/uL (ref 4.0–10.5)

## 2016-01-30 LAB — URINE CULTURE: Culture: 1000 — AB

## 2016-01-30 LAB — GLUCOSE, CAPILLARY
Glucose-Capillary: 114 mg/dL — ABNORMAL HIGH (ref 65–99)
Glucose-Capillary: 207 mg/dL — ABNORMAL HIGH (ref 65–99)

## 2016-01-30 LAB — HEMOGLOBIN A1C
Hgb A1c MFr Bld: 7.7 % — ABNORMAL HIGH (ref 4.8–5.6)
MEAN PLASMA GLUCOSE: 174 mg/dL

## 2016-01-30 LAB — BRAIN NATRIURETIC PEPTIDE: B NATRIURETIC PEPTIDE 5: 297 pg/mL — AB (ref 0.0–100.0)

## 2016-01-30 LAB — PROCALCITONIN: Procalcitonin: 0.99 ng/mL

## 2016-01-30 LAB — UREA NITROGEN, URINE: UREA NITROGEN UR: 331 mg/dL

## 2016-01-30 MED ORDER — HEPARIN SOD (PORK) LOCK FLUSH 100 UNIT/ML IV SOLN
500.0000 [IU] | INTRAVENOUS | Status: AC | PRN
  Administered 2016-01-30: 500 [IU]
  Filled 2016-01-30: qty 5

## 2016-01-30 NOTE — Discharge Summary (Signed)
Physician Discharge Summary  Theresa H Sorto [id number] DOB: 1929/07/27 DOA: 01/28/2016  PCP: Glenda Chroman, MD  Admit date: 01/28/2016 Discharge date: 01/30/2016  Time spent: 45 minutes  Recommendations for Outpatient Follow-up:  -We'll be discharged back to SNF today.   Discharge Diagnoses:  Principal Problem:   Acute respiratory failure with hypoxia (HCC) Active Problems:   HTN (hypertension)   HLD (hyperlipidemia)   Carotid artery disease (HCC)   DM (diabetes mellitus), type 2, uncontrolled, with renal complications (HCC)   CKD (chronic kidney disease) stage 2, GFR 60-89 ml/min   Iron deficiency   Acute on chronic diastolic CHF (congestive heart failure) (HCC)   Microcytic anemia   AKI (acute kidney injury) (HCC)   Leukocytosis   GERD (gastroesophageal reflux disease)   Parkinson's disease ([facility])   Acute on chronic congestive heart failure (HCC)   Renal insufficiency   Discharge Condition: Stable and improved  Filed Weights   01/28/16 1930 01/29/16 0500 01/30/16 0458  Weight: 69.128 kg (152 lb 6.4 oz) 68.765 kg (151 lb 9.6 oz) 66.5 kg (146 lb 9.7 oz)    History of present illness:  As per Dr. Myna Hidalgo on 6/1: Theresa Mclean is a medically-complex 82 y.o. female with medical history significant for coronary artery disease with stent, chronic diastolic CHF, hypertension, type 2 diabetes, renal artery stenosis, and chronic kidney disease stage II who presents to the ED from her SNF with reports of fever and desaturations to 70% last night. Patient was reportedly admitted to [facility] with left lower extremity cellulitis and treated there from 01/10/2016 to 01/12/2016 before being discharged to a SNF. She initially did well at the nursing facility until last night, when she was noted to be febrile to 101.0 and with oxygen saturation of 70% on room air. She had not been requiring supplemental oxygen prior to this. Oxygen saturations reportedly recovered well with  administration of 2 L/m supplemental oxygen. Patient was evaluated by a clinician at the SNF and there were plans to continue monitoring her there, but the patient's daughter did not agree with this plan and brought her to the ED for evaluation.  ED Course: Upon arrival to the ED, patient is found to be afebrile, saturating well on 2 L/m supplemental oxygen, heart rate of 60, and blood pressure of 122/50. EKG features a sinus rhythm with diffuse T-wave inversions and troponin is mildly elevated to a value of 0.04. BNP is elevated to 1059. Chest x-ray is notable for cardiomegaly and new diffuse interstitial edema and a pattern that is consistent with mild CHF. Chemistry panels notable for serum glucose of 300, BUN of 48, and serum creatinine of 1.88, up from an apparent baseline of close to 1. CBC is notable for a leukocytosis to 14,600 and hemoglobin of 8.5, down from the 12-13 range a year ago, and with MCV of 75.3. DRE was performed with return of normal stool which was FOBT negative. The left lower extremity was noted to be swollen greater than the right and ultrasound was obtained, negative for DVT. Patient was treated with DuoNeb and Lasix 40 mg IV in the emergency department. She remained hemodynamically stable in the ED and will be admitted to the telemetry unit for ongoing evaluation and management of acute hypoxic respiratory failure suspected secondary to acute on chronic diastolic CHF.  Hospital Course:   Acute hypoxemic respiratory failure -Improved with oxygen supplementation and diuresis.  Acute on chronic diastolic CHF -Echo: Ejection fraction of 60-65% with  normal wall motion, wall thickness was normal. -She is a little over 2 L negative since admission, volume status is much improved. -Okay for discharge back to SNF today. To continue Lasix 40 mg once to twice daily depending on fluid status.  Acute on chronic kidney disease stage II -Baseline creatinine is about 1. -Was 1.8 on  admission, down to 1.25 on discharge.  Hypertension -Well controlled.  Microcytic anemia/anemia of chronic disease -Hemoglobin stable in the upper 7s. -No indication for transfusion unless hemoglobin drops below 7.  Type 2 diabetes -Fair control.  Coronary artery disease  -Stable, no chest pain  Parkinson's disease -Stable, continue Sinemet  Procedures:  None   Consultations:  None  Discharge Instructions  Discharge Instructions    Diet - low sodium heart healthy    Complete by:  As directed      Increase activity slowly    Complete by:  As directed             Medication List    TAKE these medications        acetaminophen 325 MG tablet  Commonly known as:  TYLENOL  Take 650 mg by mouth every 6 (six) hours as needed.     ALLEGRA ALLERGY 180 MG tablet  Generic drug:  fexofenadine  Take 180 mg by mouth daily.     amLODipine 10 MG tablet  Commonly known as:  NORVASC  TAKE 1 TABLET (10 MG TOTAL) BY MOUTH DAILY.     aspirin 325 MG tablet  Take 325 mg by mouth daily.     atorvastatin 20 MG tablet  Commonly known as:  LIPITOR  Take 1 tablet (20 mg total) by mouth at bedtime.     CALMOSEPTINE 0.44-20.6 % Oint  Generic drug:  Menthol-Zinc Oxide  Apply 1 application topically 3 (three) times daily. Apply to groin area for protective barrier.     carbidopa-levodopa 25-100 MG tablet  Commonly known as:  SINEMET IR  1 tablet at 8 am, 1 tablet at 12 pm, 1 tablet at 5 pm     folic acid 1 MG tablet  Commonly known as:  FOLVITE  TAKE 1 TABLET BY MOUTH DAILY.     furosemide 40 MG tablet  Commonly known as:  LASIX  TAKE 1-3 TABLETS (40-120 MG TOTAL) BY MOUTH DAILY. BASED ON DEGREE OF SWELLING/FLUID BUILDUP     gabapentin 100 MG capsule  Commonly known as:  NEURONTIN  Take 2 capsules (200 mg total) by mouth 2 (two) times daily.     hydrALAZINE 50 MG tablet  Commonly known as:  APRESOLINE  TAKE 1 TABLET BY MOUTH 2 TIMES DAILY.     metFORMIN 500 MG tablet   Commonly known as:  GLUCOPHAGE  Take 500 mg by mouth 2 (two) times daily with a meal.     multivitamins ther. w/minerals Tabs tablet  Take 1 tablet by mouth daily.     nystatin powder  Generic drug:  nystatin  Apply 1 g topically 4 (four) times daily.     omeprazole 20 MG capsule  Commonly known as:  PRILOSEC  Take 20 mg by mouth daily.     promethazine 12.5 MG tablet  Commonly known as:  PHENERGAN  Take 12.5 mg by mouth every 6 (six) hours as needed for nausea or vomiting.     sodium bicarbonate 325 MG tablet  Take 325 mg by mouth 2 (two) times daily.     spironolactone 25 MG tablet  Commonly  known as:  ALDACTONE  TAKE 1 TABLET BY MOUTH DAILY.       Allergies  Allergen Reactions  . Ivp Dye [Iodinated Diagnostic Agents] Anaphylaxis  . Rocephin [Ceftriaxone] Diarrhea and Nausea And Vomiting       Follow-up Information    Follow up with Glenda Chroman, MD. Schedule an appointment as soon as possible for a visit in 2 weeks.   Specialty:  Internal Medicine   Contact information:   [place] [address] [phone]        The results of significant diagnostics from this hospitalization (including imaging, microbiology, ancillary and laboratory) are listed below for reference.    Significant Diagnostic Studies: Dg Chest 2 View  01/28/2016  CLINICAL DATA:  Fever, hypoxia EXAM: CHEST  2 VIEW COMPARISON:  02/23/2015 FINDINGS: Right subclavian port catheter tip proximal SVC as before. Heart is enlarged. Diffuse increased vascular congestion and interstitial opacities with basilar atelectasis, suspect developing CHF. No large effusion. Limited lateral views because of technique and positioning. Trachea is midline. Atherosclerosis of the aorta. Degenerative changes of the spine and shoulders. IMPRESSION: Cardiomegaly with new diffuse interstitial edema pattern and basilar atelectasis, suspect mild CHF. Electronically Signed   By: Jerilynn Mages.  Shick M.D.   On: 01/28/2016 16:07    US Venous Img Lower Unilateral Left  01/28/2016  CLINICAL DATA:  Left lower extremity edema EXAM: LEFT LOWER EXTREMITY VENOUS DUPLEX ULTRASOUND TECHNIQUE: Gray-scale sonography with graded compression, as well as color Doppler and duplex ultrasound were performed to evaluate the left lower extremity deep venous system from the level of the common femoral vein and including the common femoral, femoral, profunda femoral, popliteal and calf veins including the posterior tibial, peroneal and gastrocnemius veins when visible. The superficial great saphenous vein was also interrogated. Spectral Doppler was utilized to evaluate flow at rest and with distal augmentation maneuvers in the common femoral, femoral and popliteal veins. COMPARISON:  None. FINDINGS: Contralateral Common Femoral Vein: Respiratory phasicity is normal and symmetric with the symptomatic side. No evidence of thrombus. Normal compressibility. Common Femoral Vein: No evidence of thrombus. Normal compressibility, respiratory phasicity and response to augmentation. Saphenofemoral Junction: No evidence of thrombus. Normal compressibility and flow on color Doppler imaging. Profunda Femoral Vein: No evidence of thrombus. Normal compressibility and flow on color Doppler imaging. Femoral Vein: No evidence of thrombus. Normal compressibility, respiratory phasicity and response to augmentation. Popliteal Vein: No evidence of thrombus. Normal compressibility, respiratory phasicity and response to augmentation. Calf Veins: No evidence of thrombus. Normal compressibility and flow on color Doppler imaging. Superficial Great Saphenous Vein: No evidence of thrombus. Normal compressibility and flow on color Doppler imaging. Venous Reflux:  None. Other Findings:  There is extensive left lower extremity edema. IMPRESSION: No evidence of left lower extremity deep venous thrombosis. Right common femoral vein also patent. There is left lower extremity soft tissue edema  noted. Electronically Signed   By: Lowella Grip III M.D.   On: 01/28/2016 15:46    Microbiology: Recent Results (from the past 240 hour(s))  Urine culture     Status: Abnormal   Collection Time: 01/28/16  3:00 PM  Result Value Ref Range Status   Specimen Description URINE, CATHETERIZED  Final   Special Requests NONE  Final   Culture (A)  Final    1,000 COLONIES/mL INSIGNIFICANT GROWTH Performed at [facility]    Report Status 01/30/2016 FINAL  Final  MRSA PCR Screening     Status: None  Collection Time: 01/28/16  9:34 PM  Result Value Ref Range Status   MRSA by PCR NEGATIVE NEGATIVE Final    Comment:        The GeneXpert MRSA Assay (FDA approved for NASAL specimens only), is one component of a comprehensive MRSA colonization surveillance program. It is not intended to diagnose MRSA infection nor to guide or monitor treatment for MRSA infections.      Labs: Basic Metabolic Panel:  Recent Labs Lab 01/28/16 1514 01/29/16 0448 01/30/16 0526  NA 129* 135 135  K 5.1 4.6 4.2  CL 94* 99* 99*  CO2 25 27 27   GLUCOSE 300* 107* 97  BUN 48* 46* 31*  CREATININE 1.88* 1.68* 1.25*  CALCIUM 9.3 9.4 9.9  MG  --  2.2  --    Liver Function Tests:  Recent Labs Lab 01/28/16 1514  AST 19  ALT 8*  ALKPHOS 85  BILITOT 0.6  PROT 8.0  ALBUMIN 3.0*    Recent Labs Lab 01/28/16 1514  LIPASE 16   No results for input(s): AMMONIA in the last 168 hours. CBC:  Recent Labs Lab 01/28/16 1514 01/28/16 2110 01/29/16 0448 01/29/16 1045 01/30/16 0526  WBC 14.6*  --   --   --  9.1  NEUTROABS 11.7*  --   --   --   --   HGB 8.5* 7.8* 7.7* 7.8* 7.9*  HCT 27.4* 25.1* 24.3* 25.2* 25.3*  MCV 75.3*  --   --   --  76.2*  PLT 281  --   --   --  352   Cardiac Enzymes:  Recent Labs Lab 01/28/16 1514 01/28/16 1900 01/29/16 0130 01/29/16 0735  TROPONINI 0.04* 0.03 0.03 0.03   BNP: BNP (last 3 results)  Recent Labs  01/28/16 1514 01/29/16 0448  01/30/16 0526  BNP 1059.0* 631.0* 297.0*    ProBNP (last 3 results) No results for input(s): PROBNP in the last 8760 hours.  CBG:  Recent Labs Lab 01/29/16 1113 01/29/16 1639 01/29/16 2127 01/30/16 0736 01/30/16 1021  GLUCAP 160* 138* 172* 114* 207*       Signed:  HERNANDEZ ACOSTA,ESTELA  [facility] Pager: [phone] 01/30/2016, 1:01 PM

## 2016-01-30 NOTE — Progress Notes (Signed)
Called report to Engineer, manufacturing at [facility] regarding pt's return to facility. Pt information and education explained. Packet sent with patient. . VSS IV access D/C and hep locked. Pt traveled by EMS. Oswald Hillock, RN

## 2016-02-01 DIAGNOSIS — G20 Parkinson's disease: Secondary | ICD-10-CM | POA: Diagnosis not present

## 2016-02-01 DIAGNOSIS — I10 Essential (primary) hypertension: Secondary | ICD-10-CM | POA: Diagnosis not present

## 2016-02-01 DIAGNOSIS — I2510 Atherosclerotic heart disease of native coronary artery without angina pectoris: Secondary | ICD-10-CM | POA: Diagnosis not present

## 2016-02-01 DIAGNOSIS — I509 Heart failure, unspecified: Secondary | ICD-10-CM | POA: Diagnosis not present

## 2016-02-01 LAB — FOLATE RBC
Folate, Hemolysate: 545.5 ng/mL
Folate, RBC: 2236 ng/mL (ref >498)
Hematocrit: 24.4 % — ABNORMAL LOW (ref 34.0–46.6)

## 2016-02-11 DIAGNOSIS — D508 Other iron deficiency anemias: Secondary | ICD-10-CM | POA: Diagnosis not present

## 2016-02-11 DIAGNOSIS — I509 Heart failure, unspecified: Secondary | ICD-10-CM | POA: Diagnosis not present

## 2016-02-12 DIAGNOSIS — I509 Heart failure, unspecified: Secondary | ICD-10-CM | POA: Diagnosis not present

## 2016-02-12 DIAGNOSIS — D508 Other iron deficiency anemias: Secondary | ICD-10-CM | POA: Diagnosis not present

## 2016-02-12 DIAGNOSIS — M25512 Pain in left shoulder: Secondary | ICD-10-CM | POA: Diagnosis not present

## 2016-02-25 ENCOUNTER — Ambulatory Visit: Payer: Medicare Other | Admitting: Neurology

## 2016-02-25 DIAGNOSIS — E785 Hyperlipidemia, unspecified: Secondary | ICD-10-CM | POA: Diagnosis not present

## 2016-02-25 DIAGNOSIS — L0291 Cutaneous abscess, unspecified: Secondary | ICD-10-CM | POA: Diagnosis not present

## 2016-02-25 DIAGNOSIS — Z794 Long term (current) use of insulin: Secondary | ICD-10-CM | POA: Diagnosis not present

## 2016-02-25 DIAGNOSIS — G20 Parkinson's disease: Secondary | ICD-10-CM | POA: Diagnosis not present

## 2016-02-25 DIAGNOSIS — Z7982 Long term (current) use of aspirin: Secondary | ICD-10-CM | POA: Diagnosis not present

## 2016-02-25 DIAGNOSIS — L02213 Cutaneous abscess of chest wall: Secondary | ICD-10-CM | POA: Diagnosis not present

## 2016-02-25 DIAGNOSIS — L03818 Cellulitis of other sites: Secondary | ICD-10-CM | POA: Diagnosis not present

## 2016-02-25 DIAGNOSIS — Z881 Allergy status to other antibiotic agents status: Secondary | ICD-10-CM | POA: Diagnosis not present

## 2016-02-25 DIAGNOSIS — I10 Essential (primary) hypertension: Secondary | ICD-10-CM | POA: Diagnosis not present

## 2016-02-25 DIAGNOSIS — E119 Type 2 diabetes mellitus without complications: Secondary | ICD-10-CM | POA: Diagnosis not present

## 2016-02-25 DIAGNOSIS — I509 Heart failure, unspecified: Secondary | ICD-10-CM | POA: Diagnosis not present

## 2016-02-25 DIAGNOSIS — T80219A Unspecified infection due to central venous catheter, initial encounter: Secondary | ICD-10-CM | POA: Diagnosis not present

## 2016-02-25 DIAGNOSIS — I2510 Atherosclerotic heart disease of native coronary artery without angina pectoris: Secondary | ICD-10-CM | POA: Diagnosis not present

## 2016-02-25 DIAGNOSIS — Z79899 Other long term (current) drug therapy: Secondary | ICD-10-CM | POA: Diagnosis not present

## 2016-02-25 DIAGNOSIS — D631 Anemia in chronic kidney disease: Secondary | ICD-10-CM | POA: Diagnosis not present

## 2016-02-25 DIAGNOSIS — B354 Tinea corporis: Secondary | ICD-10-CM | POA: Diagnosis not present

## 2016-02-25 DIAGNOSIS — I130 Hypertensive heart and chronic kidney disease with heart failure and stage 1 through stage 4 chronic kidney disease, or unspecified chronic kidney disease: Secondary | ICD-10-CM | POA: Diagnosis not present

## 2016-02-25 DIAGNOSIS — T827XXA Infection and inflammatory reaction due to other cardiac and vascular devices, implants and grafts, initial encounter: Secondary | ICD-10-CM | POA: Diagnosis not present

## 2016-02-25 DIAGNOSIS — R262 Difficulty in walking, not elsewhere classified: Secondary | ICD-10-CM | POA: Diagnosis not present

## 2016-02-25 DIAGNOSIS — Z91041 Radiographic dye allergy status: Secondary | ICD-10-CM | POA: Diagnosis not present

## 2016-02-25 DIAGNOSIS — E1122 Type 2 diabetes mellitus with diabetic chronic kidney disease: Secondary | ICD-10-CM | POA: Diagnosis not present

## 2016-02-25 DIAGNOSIS — T80212A Local infection due to central venous catheter, initial encounter: Secondary | ICD-10-CM | POA: Diagnosis not present

## 2016-02-25 DIAGNOSIS — N189 Chronic kidney disease, unspecified: Secondary | ICD-10-CM | POA: Diagnosis not present

## 2016-02-25 DIAGNOSIS — K219 Gastro-esophageal reflux disease without esophagitis: Secondary | ICD-10-CM | POA: Diagnosis not present

## 2016-02-25 DIAGNOSIS — Z955 Presence of coronary angioplasty implant and graft: Secondary | ICD-10-CM | POA: Diagnosis not present

## 2016-02-26 DIAGNOSIS — E119 Type 2 diabetes mellitus without complications: Secondary | ICD-10-CM | POA: Diagnosis not present

## 2016-02-26 DIAGNOSIS — G20 Parkinson's disease: Secondary | ICD-10-CM | POA: Diagnosis not present

## 2016-02-26 DIAGNOSIS — I10 Essential (primary) hypertension: Secondary | ICD-10-CM | POA: Diagnosis not present

## 2016-02-26 DIAGNOSIS — Z794 Long term (current) use of insulin: Secondary | ICD-10-CM | POA: Diagnosis not present

## 2016-03-02 ENCOUNTER — Other Ambulatory Visit: Payer: Self-pay | Admitting: Interventional Radiology

## 2016-03-02 DIAGNOSIS — I739 Peripheral vascular disease, unspecified: Secondary | ICD-10-CM

## 2016-03-07 DIAGNOSIS — L0291 Cutaneous abscess, unspecified: Secondary | ICD-10-CM | POA: Diagnosis not present

## 2016-03-07 DIAGNOSIS — I10 Essential (primary) hypertension: Secondary | ICD-10-CM | POA: Diagnosis not present

## 2016-03-07 DIAGNOSIS — G20 Parkinson's disease: Secondary | ICD-10-CM | POA: Diagnosis not present

## 2016-03-07 DIAGNOSIS — K219 Gastro-esophageal reflux disease without esophagitis: Secondary | ICD-10-CM | POA: Diagnosis not present

## 2016-03-09 ENCOUNTER — Encounter: Payer: Self-pay | Admitting: Neurology

## 2016-03-10 DIAGNOSIS — T80219A Unspecified infection due to central venous catheter, initial encounter: Secondary | ICD-10-CM | POA: Diagnosis not present

## 2016-03-11 DIAGNOSIS — G20 Parkinson's disease: Secondary | ICD-10-CM | POA: Diagnosis not present

## 2016-03-11 DIAGNOSIS — L02213 Cutaneous abscess of chest wall: Secondary | ICD-10-CM | POA: Diagnosis not present

## 2016-03-11 DIAGNOSIS — E1122 Type 2 diabetes mellitus with diabetic chronic kidney disease: Secondary | ICD-10-CM | POA: Diagnosis not present

## 2016-03-11 DIAGNOSIS — T80212A Local infection due to central venous catheter, initial encounter: Secondary | ICD-10-CM | POA: Diagnosis not present

## 2016-03-11 DIAGNOSIS — I130 Hypertensive heart and chronic kidney disease with heart failure and stage 1 through stage 4 chronic kidney disease, or unspecified chronic kidney disease: Secondary | ICD-10-CM | POA: Diagnosis not present

## 2016-03-11 DIAGNOSIS — I10 Essential (primary) hypertension: Secondary | ICD-10-CM | POA: Diagnosis not present

## 2016-03-11 DIAGNOSIS — E119 Type 2 diabetes mellitus without complications: Secondary | ICD-10-CM | POA: Diagnosis not present

## 2016-03-11 DIAGNOSIS — N189 Chronic kidney disease, unspecified: Secondary | ICD-10-CM | POA: Diagnosis not present

## 2016-03-11 DIAGNOSIS — K219 Gastro-esophageal reflux disease without esophagitis: Secondary | ICD-10-CM | POA: Diagnosis not present

## 2016-03-11 DIAGNOSIS — I509 Heart failure, unspecified: Secondary | ICD-10-CM | POA: Diagnosis not present

## 2016-03-26 DIAGNOSIS — B354 Tinea corporis: Secondary | ICD-10-CM | POA: Diagnosis not present

## 2016-04-07 NOTE — Progress Notes (Signed)
Subjective:   Theresa Mclean was seen in consultation in the movement disorder clinic at the request of Glenda Chroman, MD.  The evaluation is for tremor.  This patient is accompanied in the office by her child (son) who supplements the history.   The patient is a 82 y.o. right handed female with a history of tremor.  Pt states that it started in the last 1-2 months.  It seems to come and goes.  She seems to have it some days and not others.  It is most noticeable when the hands are not in use (at rest).  The L is most obvious and she doesn't notice the right much.  She can try and hold the arm and stop it for a little while. Her hydralazine was increased in November but she isn't sure if that is related.  Tremor: Yes.   Voice: some softer Sleep:   Vivid Dreams:  No.  Acting out dreams:  No. Wet Pillows: No. Postural symptoms:  Yes.    Falls?  No. (states that uses WC because of "bad knees" and doesn't walk now) Bradykinesia symptoms: slow movements, difficulty getting out of a chair and difficulty regaining balance Loss of smell:  Yes.   Loss of taste:  No. Urinary Incontinence:  No. Difficulty Swallowing:  No. (was troublesome but better now) Handwriting, micrographia: had accident in 1991 and hurt hand and hasn't written well since Trouble with ADL's:  Yes.  (needs assist)  Trouble buttoning clothing: Yes.   Depression:  No Memory changes:  Yes.   Hallucinations:  No.  visual distortions: rare N/V:  No. Lightheaded:  Yes.    Syncope: No. Diplopia:  Yes.   (told due to cataract)  09/17/14 update:  The patient is following up sooner than expected, accompanied by her son who supplements the history.  Patient has history of parkinsonian tremor, but no evidence of Parkinson's disease.  Because of horizontal nystagmus, I did order an MRI of the brain.  There was evidence of mild to moderate atrophy and mild to moderate small vessel disease.  I reviewed this with the patient and her  family today.  The patient's son stated that he wanted to talk to me today about memory changes.  The patient admits to memory changes, although she cannot give me specific examples.  The patient's son stated that she forgot her Social Security number on one occasion, which is very unusual.  In addition, she'll sometimes confuse her sons.  Recently, she confused a $1 bill for a $20 bill.  01/12/15 update:  The patient is following up today, accompanied by her son [name]), daughter and a caregiver, who supplement the history.  Records were reviewed since last visit as well.  The patient has a history of parkinsonian tremor, without other features of Parkinson's disease.  According to her son, her tremor has not worsened.  It tends to come and go.  Last visit, we talked extensively about dementia but she had refused any medication for that.  Today, however, she does return on Aricept, 5 mg daily.  Her daughter states that it was just started last Thursday by her primary care physician.  She states that the primary care physician was concerned and was wondering if we could "the appropriate memory testing and if mini strokes were ruled out."  The patient has tolerated Aricept well since last week.  She was having some hallucinations, both daytime and nighttime and having difficulty sleeping.  She was  placed on Ambien last week and the sleep got better and the hallucinations also got better.  She is not walking at all and even for transfers, her son has to lift her onto the bed or onto the toilet seat.  The patient does have an appointment with [facility] gastroenterology next week, as she has been throwing up after she eats.  07/13/15 update:  The patient follows up today, accompanied by her son, daughter, granddaughter and another son is on the phone who supplements the history.  I also reviewed records available to me.  Last visit, I increased her donepezil to 10 mg daily.  Her family reports that she is off the  medication because they think that the aricept was d/c in the hospital/SNF and they had just realized it.   Her daughter thinks that she has probably been off of it for 3-4 months.  She does have a Parkinsonian tremor, but has not had evidence of Parkinson's disease; she is on no medication for that.  She has been seeing interventional radiology regarding a foot wound that was not healing.  She had percutaneous intervention for critical left SFA stenosis.  Shortly after that, she was hospitalized at [facility] for a syncopal episode, associated with hypoxia and systolic blood pressure approximately 88.  She was found to be anemic and 2 units of packed red blood cells were given.  She does have 24 hour per day care in the home.  Her granddaughter is now much more involved with her care.  She reports that the patient is not aggressive or agitated ever, but confusion seems to wax and wane.  On "good days" she will nap about 30 minutes.  On bad days she may nap for an hour and a half and nighttime sleep is worse.  She gets up many times to use the restroom.  Her last of liquid at night is approximately 8 PM and that is a small amount to take her medications.  The patient is not doing anything physical activity wise at home, nor is she engaging in mental activities.  She was supposed to do home physical therapy in the past but apparently refused.  09/08/15 update:  The patient is following up today, accompanied by her family (son, daughter and another daughter is on phone) who supplements the history.  We gave the patient a trial of levodopa last visit (8am/2pm/8pm).  Today, her family states that she is doing better; she is no longer having tremor.  She can walk very little because of having an ulcer on the foot.  However, she is walking better than previously.  She completed PT on 12/30.   No hallucinations.   She was off of Aricept last visit as it was accidentally discontinued while she was at the hospital.  We  decided to hold that since we were just starting levodopa and did not want to start 2 medications at once.  However, she returns today back on the aricept. Her family is providing 24/7 care and pharmacy is distributing medication.   04/08/16 update:  The patient is following up today, accompanied by her daughter who  supplements the history. Records were reviewed since last visit.  She is now in a SNF in [place].  Has been in SNF since May but changed facilities in June.  Is physically transferring better than she was.  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  .   She is again off of  aricept for her dementia.  No diarrhea.  No hallucinations.  Very depressed due to leaving home situation.  Not involved with any activities.  Stays in room at facility. She was hospitalized from 6/1-6/3 with acute resp failure and CHF.  Outside reports reviewed: historical medical records, radiology reports and referral letter/letters.  Allergies  Allergen Reactions  . Ivp Dye [Iodinated Diagnostic Agents] Anaphylaxis  . Rocephin [Ceftriaxone] Diarrhea and Nausea And Vomiting    Outpatient Encounter Prescriptions as of 04/08/2016  Medication Sig  . acetaminophen (TYLENOL) 325 MG tablet Take 650 mg by mouth every 6 (six) hours as needed.  Marland Kitchen amLODipine (NORVASC) 10 MG tablet TAKE 1 TABLET (10 MG TOTAL) BY MOUTH DAILY.  Marland Kitchen aspirin 325 MG tablet Take 325 mg by mouth daily.  Marland Kitchen atorvastatin (LIPITOR) 20 MG tablet Take 1 tablet (20 mg total) by mouth at bedtime.  . carbidopa-levodopa (SINEMET IR) 25-100 MG tablet 1 tablet at 8 am, 1 tablet at 12 pm, 1 tablet at 5 pm  . fexofenadine (ALLEGRA ALLERGY) 180 MG tablet Take 180 mg by mouth daily.  . folic acid (FOLVITE) 1 MG tablet TAKE 1 TABLET BY MOUTH DAILY.  . furosemide (LASIX) 40 MG tablet TAKE 1-3 TABLETS (40-120 MG TOTAL) BY MOUTH DAILY. BASED ON DEGREE OF SWELLING/FLUID BUILDUP  . gabapentin (NEURONTIN) 100 MG capsule  Take 2 capsules (200 mg total) by mouth 2 (two) times daily. (Patient taking differently: Take 100 mg by mouth 2 (two) times daily. )  . hydrALAZINE (APRESOLINE) 50 MG tablet TAKE 1 TABLET BY MOUTH 2 TIMES DAILY.  Marland Kitchen HYDROcodone-acetaminophen (NORCO/VICODIN) 5-325 MG tablet Take 1 tablet by mouth every 6 (six) hours as needed for moderate pain.  . Menthol-Zinc Oxide (CALMOSEPTINE) 0.44-20.6 % OINT Apply 1 application topically 3 (three) times daily. Apply to groin area for protective barrier.  . metFORMIN (GLUCOPHAGE) 500 MG tablet Take 500 mg by mouth 2 (two) times daily with a meal.  . Multiple Vitamins-Minerals (MULTIVITAMINS THER. W/MINERALS) TABS tablet Take 1 tablet by mouth daily.  Marland Kitchen nystatin (NYSTATIN) powder Apply 1 g topically 4 (four) times daily.  Marland Kitchen omeprazole (PRILOSEC) 20 MG capsule Take 20 mg by mouth daily.  . promethazine (PHENERGAN) 12.5 MG tablet Take 12.5 mg by mouth every 6 (six) hours as needed for nausea or vomiting.  . sodium bicarbonate 325 MG tablet Take 325 mg by mouth 2 (two) times daily.  Marland Kitchen spironolactone (ALDACTONE) 25 MG tablet TAKE 1 TABLET BY MOUTH DAILY.   No facility-administered encounter medications on file as of 04/08/2016.     Past Medical History:  Diagnosis Date  . Acute renal insufficiency    09/2011. not on ACEI secondary to this (also has renal artery stenosis)  . Arthritis   . CAD (coronary artery disease)    NSTEMI 09/2011 felt poor candidate for CABG, instead s/p PTCA/DES to mid RCA 10/17/11  . Cellulitis   . CHF (congestive heart failure) (HCC)    Diastolic. EF 55-65% by cath 10/11/11  . Chronic back pain   . Chronic knee pain   . Chronic respiratory failure ([place])   . CKD (chronic kidney disease) stage 2, GFR 60-89 ml/min 07/11/2013  . Diabetes mellitus    insulin dependent  . GERD (gastroesophageal reflux disease)   . Hyperlipidemia   . Hypertension   . Mitral regurgitation   . Morbid obesity ([place])   . Nephrolithiasis   . Obstructive  sleep apnea   . Peripheral vascular disease ([place])  severe right renal artery stenosis and left SFA stenosis by PV angio 09/2011, treated medically  . Psoriasis   . Pulmonary hypertension ([place])   . Radiculopathy    lumbar  . Sleep apnea    uses cpap  . Vitamin D deficiency     Past Surgical History:  Procedure Laterality Date  . ABDOMINAL HYSTERECTOMY    . ACROMIO-CLAVICULAR JOINT REPAIR  2011  . EYE SURGERY     cataract  OD  . LEFT AND RIGHT HEART CATHETERIZATION WITH CORONARY ANGIOGRAM N/A 10/11/2011   Procedure: LEFT AND RIGHT HEART CATHETERIZATION WITH CORONARY ANGIOGRAM;  Surgeon: Sherren Mocha, MD;  Location: [facility] CATH LAB;  Service: Cardiovascular;  Laterality: N/A;  . LOWER EXTREMITY ANGIOGRAM N/A 10/11/2011   Procedure: LOWER EXTREMITY ANGIOGRAM;  Surgeon: Sherren Mocha, MD;  Location: [facility] CATH LAB;  Service: Cardiovascular;  Laterality: N/A;  . PERCUTANEOUS CORONARY STENT INTERVENTION (PCI-S) N/A 10/17/2011   Procedure: PERCUTANEOUS CORONARY STENT INTERVENTION (PCI-S);  Surgeon: Burnell Blanks, MD;  Location: [facility] CATH LAB;  Service: Cardiovascular;  Laterality: N/A;    Social History   Social History  . Marital status: Divorced    Spouse name: N/A  . Number of children: N/A  . Years of education: N/A   Occupational History  . Not on file.   Social History Main Topics  . Smoking status: Never Smoker  . Smokeless tobacco: Never Used  . Alcohol use No  . Drug use: No  . Sexual activity: No   Other Topics Concern  . Not on file   Social History Narrative  . No narrative on file    Family Status  Relation Status  . Mother Deceased   Cancer  . Father Deceased   Cancer  . Brother Alive  . Brother Alive  . Brother Alive  . Brother Deceased   accident (horse)  . Sister Alive  . Sister Alive  . Sister Alive  . Sister Alive  . Sister Alive  . Sister Alive  . Sister Alive  . Sister Alive  . Sister Alive  . Sister Deceased   unknown  . Son  Alive  . Son Alive  . Son Alive  . Daughter Alive    Review of Systems Limited due to memory change.   Objective:   VITALS:   Vitals:   04/08/16 1106  BP: 124/70  Pulse: (!) 56   Gen:  Appears stated age and in NAD. CV:  RRR Lungs:  CTAB Neck:  No bruits   NEUROLOGICAL:  Orientation:    Oriented to person, not place/time.   Montreal Cognitive Assessment  04/08/2016 09/17/2014  Visuospatial/ Executive (0/5) 1 1  Naming (0/3) 1 1  Attention: Read list of digits (0/2) 2 0  Attention: Read list of letters (0/1) 1 0  Attention: Serial 7 subtraction starting at 100 (0/3) 0 0  Language: Repeat phrase (0/2) 1 2  Language : Fluency (0/1) 0 0  Abstraction (0/2) 1 0  Delayed Recall (0/5) 0 0  Orientation (0/6) 1 5  Total 8 9  Adjusted Score (based on education) 9 10   cranial nerves: There is good facial symmetry.  Extraocular muscles are intact.  There is very little spontaneity of speech, but what she does say is hypophonic but fluent. Tone: There appears to be increased tone in the right upper extremity.  She has gegenhalten so it is difficult to examine. Coordination: There is decremation bilaterally with finger taps.  Toe taps slower on  the L than the right. Motor: Strength is at least antigravity 4.  Does not participate with manual muscle testing. Sensation: Intact to light touch throughout.  Does not participate with more sensitive aspects. Gait and Station: She was a 1 person assist to get her out of the wheelchair.  She was given a walker.  She walks slow but is stable with it.   MOVEMENT EXAM: Tremor:  There is no tremor noted  Labs:  Lab Results  Component Value Date   TSH 3.435 08/28/2014     Chemistry      Component Value Date/Time   NA 135 01/30/2016 0526   K 4.2 01/30/2016 0526   CL 99 (L) 01/30/2016 0526   CO2 27 01/30/2016 0526   BUN 31 (H) 01/30/2016 0526   CREATININE 1.25 (H) 01/30/2016 0526   CREATININE 1.37 (H) 01/13/2015 1325      Component  Value Date/Time   CALCIUM 9.9 01/30/2016 0526   ALKPHOS 85 01/28/2016 1514   AST 19 01/28/2016 1514   ALT 8 (L) 01/28/2016 1514   BILITOT 0.6 01/28/2016 1514     Lab Results  Component Value Date   WBC 9.1 01/30/2016   HGB 7.9 (L) 01/30/2016   HCT 25.3 (L) 01/30/2016   MCV 76.2 (L) 01/30/2016   PLT 352 01/30/2016        Assessment/Plan:   1.  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  She will stay on carbidopa/levodopa 25/100, one tablet 3 times per day; change timing to 7am/11pm/4pm now that in SNF.  She is walking better and has no tremor.  She has no hallucinations. 2.  MRI with cerebral small vessel disease and atrophy  -Multiple risk factors for this including hypertension, hyperlipidemia, diabetes mellitus and peripheral vascular disease.  I reviewed this with her daughter today as she had not previously been here in 3.  Memory changes, likely Alzheimer's dementia although could be vascular.  -She is off aricept again.  Did not restart 4.  Depression  -due to being in SNF now  -daughter expresses desire to start lexapro.  Risks, benefits, side effects and alternative therapies were discussed.  The opportunity to ask questions was given and they were answered to the best of my ability.  The patient expressed understanding and willingness to follow the outlined treatment protocols.  -asked SNF to reassess this in a month and change as indicated 5.  Living in [place] now and hard to get her here so f/u prn.  Much greater than 50% of this visit was spent in counseling with family and coordinating care.  Total face to face time:  40 min

## 2016-04-08 ENCOUNTER — Encounter: Payer: Self-pay | Admitting: Neurology

## 2016-04-08 ENCOUNTER — Ambulatory Visit (INDEPENDENT_AMBULATORY_CARE_PROVIDER_SITE_OTHER): Payer: Medicare Other | Admitting: Neurology

## 2016-04-08 VITALS — BP 124/70 | HR 56

## 2016-04-08 DIAGNOSIS — F0280 Dementia in other diseases classified elsewhere without behavioral disturbance: Secondary | ICD-10-CM | POA: Diagnosis not present

## 2016-04-08 DIAGNOSIS — G214 Vascular parkinsonism: Secondary | ICD-10-CM | POA: Diagnosis not present

## 2016-04-08 DIAGNOSIS — I2510 Atherosclerotic heart disease of native coronary artery without angina pectoris: Secondary | ICD-10-CM | POA: Diagnosis not present

## 2016-04-08 DIAGNOSIS — G301 Alzheimer's disease with late onset: Secondary | ICD-10-CM | POA: Diagnosis not present

## 2016-04-08 MED ORDER — ESCITALOPRAM OXALATE 10 MG PO TABS: 10.0000 mg | ORAL_TABLET | Freq: Every day | ORAL | 1 refills | Status: AC

## 2016-04-11 DIAGNOSIS — B351 Tinea unguium: Secondary | ICD-10-CM | POA: Diagnosis not present

## 2016-04-11 DIAGNOSIS — L84 Corns and callosities: Secondary | ICD-10-CM | POA: Diagnosis not present

## 2016-04-11 DIAGNOSIS — E1142 Type 2 diabetes mellitus with diabetic polyneuropathy: Secondary | ICD-10-CM | POA: Diagnosis not present

## 2016-04-20 DIAGNOSIS — B354 Tinea corporis: Secondary | ICD-10-CM | POA: Diagnosis not present

## 2016-04-27 ENCOUNTER — Other Ambulatory Visit: Payer: Medicare Other

## 2016-04-27 DIAGNOSIS — I10 Essential (primary) hypertension: Secondary | ICD-10-CM | POA: Diagnosis not present

## 2016-04-27 DIAGNOSIS — D631 Anemia in chronic kidney disease: Secondary | ICD-10-CM | POA: Diagnosis not present

## 2016-04-27 DIAGNOSIS — G20 Parkinson's disease: Secondary | ICD-10-CM | POA: Diagnosis not present

## 2016-04-27 DIAGNOSIS — K219 Gastro-esophageal reflux disease without esophagitis: Secondary | ICD-10-CM | POA: Diagnosis not present

## 2016-05-03 DIAGNOSIS — Z79899 Other long term (current) drug therapy: Secondary | ICD-10-CM | POA: Diagnosis not present

## 2016-05-03 DIAGNOSIS — N19 Unspecified kidney failure: Secondary | ICD-10-CM | POA: Diagnosis not present

## 2016-05-03 DIAGNOSIS — D649 Anemia, unspecified: Secondary | ICD-10-CM | POA: Diagnosis not present

## 2016-05-03 DIAGNOSIS — E559 Vitamin D deficiency, unspecified: Secondary | ICD-10-CM | POA: Diagnosis not present

## 2016-05-04 DIAGNOSIS — E119 Type 2 diabetes mellitus without complications: Secondary | ICD-10-CM | POA: Diagnosis not present

## 2016-05-04 DIAGNOSIS — D631 Anemia in chronic kidney disease: Secondary | ICD-10-CM | POA: Diagnosis not present

## 2016-05-04 DIAGNOSIS — E785 Hyperlipidemia, unspecified: Secondary | ICD-10-CM | POA: Diagnosis not present

## 2016-05-04 DIAGNOSIS — D509 Iron deficiency anemia, unspecified: Secondary | ICD-10-CM | POA: Diagnosis not present

## 2016-05-04 DIAGNOSIS — N19 Unspecified kidney failure: Secondary | ICD-10-CM | POA: Diagnosis not present

## 2016-05-04 DIAGNOSIS — E349 Endocrine disorder, unspecified: Secondary | ICD-10-CM | POA: Diagnosis not present

## 2016-05-04 DIAGNOSIS — I509 Heart failure, unspecified: Secondary | ICD-10-CM | POA: Diagnosis not present

## 2016-05-04 DIAGNOSIS — G20 Parkinson's disease: Secondary | ICD-10-CM | POA: Diagnosis not present

## 2016-05-04 DIAGNOSIS — E559 Vitamin D deficiency, unspecified: Secondary | ICD-10-CM | POA: Diagnosis not present

## 2016-05-10 DIAGNOSIS — E1129 Type 2 diabetes mellitus with other diabetic kidney complication: Secondary | ICD-10-CM | POA: Diagnosis not present

## 2016-05-10 DIAGNOSIS — I10 Essential (primary) hypertension: Secondary | ICD-10-CM | POA: Diagnosis not present

## 2016-05-10 DIAGNOSIS — N189 Chronic kidney disease, unspecified: Secondary | ICD-10-CM | POA: Diagnosis not present

## 2016-05-11 DIAGNOSIS — B354 Tinea corporis: Secondary | ICD-10-CM | POA: Diagnosis not present

## 2016-05-11 DIAGNOSIS — L309 Dermatitis, unspecified: Secondary | ICD-10-CM | POA: Diagnosis not present

## 2016-05-11 DIAGNOSIS — G20 Parkinson's disease: Secondary | ICD-10-CM | POA: Diagnosis not present

## 2016-05-11 DIAGNOSIS — L219 Seborrheic dermatitis, unspecified: Secondary | ICD-10-CM | POA: Diagnosis not present

## 2016-05-24 ENCOUNTER — Ambulatory Visit
Admission: RE | Admit: 2016-05-24 | Discharge: 2016-05-24 | Disposition: A | Discharge status: Home / Self Care | Payer: Medicare Other | Source: Ambulatory Visit | Attending: Interventional Radiology | Admitting: Interventional Radiology

## 2016-05-24 DIAGNOSIS — L97529 Non-pressure chronic ulcer of other part of left foot with unspecified severity: Secondary | ICD-10-CM | POA: Diagnosis not present

## 2016-05-24 DIAGNOSIS — I739 Peripheral vascular disease, unspecified: Secondary | ICD-10-CM

## 2016-05-24 HISTORY — PX: IR GENERIC HISTORICAL: IMG1180011

## 2016-05-24 NOTE — Progress Notes (Signed)
Chief Complaint: Diabetic foot ulcer.   Referring Physician(s): Dr. Irving Shows, [facility], [place].   Current Podiatrist: Dr. Levonne Spiller, [place]  History of Present Illness: Theresa Mclean is a 82 y.o. female presenting today as a scheduled follow-up to vascular and interventional radiology clinic. She is a woman with a history of left foot diabetic foot ulcer, with ulceration on the medial aspect of the left metatarsal phalangeal joint at the base of the great toe. Originally she was referred for evaluation in April 2016.  We performed an angiogram with directional atherectomy and anti-restenotic therapy with drug-eluting balloon treatment of a single lesion in the left superficial femoral artery 02/16/2015.  Since that time she has seen significant improvement, and returns today with a healed ulcer on the base of her toe.  Her daughter is with her today at the appointment, and provides the majority of the history. She tells me she is now seeing a podiatrist Dr. Irving Shows in [place].  I have not had any problems with the left toe since the wound has healed. No new left-sided wounds.  She has had a recent appointment at podiatry with no issues, though does make me aware today of a small lesion on the tip of the right great toe. She has had no recent hospitalizations, fevers, chills. No other symptoms.  She continues on her maximum medical therapy for cardiovascular risk factors. At this time her antiplatelet medication is aspirin 325 mg daily.  Past Medical History:  Diagnosis Date  . Acute renal insufficiency    09/2011. not on ACEI secondary to this (also has renal artery stenosis)  . Arthritis   . CAD (coronary artery disease)    NSTEMI 09/2011 felt poor candidate for CABG, instead s/p PTCA/DES to mid RCA 10/17/11  . Cellulitis   . CHF (congestive heart failure) (HCC)    Diastolic. EF 55-65% by cath 10/11/11  . Chronic back pain   . Chronic knee  pain   . Chronic respiratory failure ([place])   . CKD (chronic kidney disease) stage 2, GFR 60-89 ml/min 07/11/2013  . Diabetes mellitus    insulin dependent  . GERD (gastroesophageal reflux disease)   . Hyperlipidemia   . Hypertension   . Mitral regurgitation   . Morbid obesity ([place])   . Nephrolithiasis   . Obstructive sleep apnea   . Peripheral vascular disease ([place])    severe right renal artery stenosis and left SFA stenosis by PV angio 09/2011, treated medically  . Psoriasis   . Pulmonary hypertension ([place])   . Radiculopathy    lumbar  . Sleep apnea    uses cpap  . Vitamin D deficiency     Past Surgical History:  Procedure Laterality Date  . ABDOMINAL HYSTERECTOMY    . ACROMIO-CLAVICULAR JOINT REPAIR  2011  . EYE SURGERY     cataract  OD  . LEFT AND RIGHT HEART CATHETERIZATION WITH CORONARY ANGIOGRAM N/A 10/11/2011   Procedure: LEFT AND RIGHT HEART CATHETERIZATION WITH CORONARY ANGIOGRAM;  Surgeon: Sherren Mocha, MD;  Location: [facility] CATH LAB;  Service: Cardiovascular;  Laterality: N/A;  . LOWER EXTREMITY ANGIOGRAM N/A 10/11/2011   Procedure: LOWER EXTREMITY ANGIOGRAM;  Surgeon: Sherren Mocha, MD;  Location: [facility] CATH LAB;  Service: Cardiovascular;  Laterality: N/A;  . PERCUTANEOUS CORONARY STENT INTERVENTION (PCI-S) N/A 10/17/2011   Procedure: PERCUTANEOUS CORONARY STENT INTERVENTION (PCI-S);  Surgeon: Burnell Blanks, MD;  Location: [facility] CATH LAB;  Service: Cardiovascular;  Laterality: N/A;  Allergies: Ivp dye [iodinated diagnostic agents] and Rocephin [ceftriaxone]  Medications: Prior to Admission medications   Medication Sig Start Date End Date Taking? Authorizing Provider  acetaminophen (TYLENOL) 325 MG tablet Take 650 mg by mouth every 6 (six) hours as needed.   Yes Historical Provider, MD  amLODipine (NORVASC) 10 MG tablet TAKE 1 TABLET (10 MG TOTAL) BY MOUTH DAILY. 01/26/16  Yes Herminio Commons, MD  aspirin 325 MG tablet Take 325 mg by mouth daily.   Yes  Historical Provider, MD  atorvastatin (LIPITOR) 20 MG tablet Take 1 tablet (20 mg total) by mouth at bedtime. 05/20/15  Yes Imogene Burn, PA-C  carbidopa-levodopa (SINEMET IR) 25-100 MG tablet 1 tablet at 8 am, 1 tablet at 12 pm, 1 tablet at 5 pm 09/08/15  Yes Rebecca S Tat, DO  fexofenadine (ALLEGRA ALLERGY) 180 MG tablet Take 180 mg by mouth daily.   Yes Historical Provider, MD  folic acid (FOLVITE) 1 MG tablet TAKE 1 TABLET BY MOUTH DAILY. 11/25/15  Yes Herminio Commons, MD  furosemide (LASIX) 40 MG tablet TAKE 1-3 TABLETS (40-120 MG TOTAL) BY MOUTH DAILY. BASED ON DEGREE OF SWELLING/FLUID BUILDUP 12/02/15  Yes Herminio Commons, MD  gabapentin (NEURONTIN) 100 MG capsule Take 2 capsules (200 mg total) by mouth 2 (two) times daily. Patient taking differently: Take 100 mg by mouth 2 (two) times daily.  07/14/14  Yes Geradine Girt, DO  hydrALAZINE (APRESOLINE) 50 MG tablet TAKE 1 TABLET BY MOUTH 2 TIMES DAILY. 01/26/16  Yes Herminio Commons, MD  metFORMIN (GLUCOPHAGE) 500 MG tablet Take 500 mg by mouth 2 (two) times daily with a meal.   Yes Historical Provider, MD  Multiple Vitamins-Minerals (MULTIVITAMINS THER. W/MINERALS) TABS tablet Take 1 tablet by mouth daily.   Yes Historical Provider, MD  omeprazole (PRILOSEC) 20 MG capsule Take 20 mg by mouth daily.   Yes Historical Provider, MD  promethazine (PHENERGAN) 12.5 MG tablet Take 12.5 mg by mouth every 6 (six) hours as needed for nausea or vomiting.   Yes Historical Provider, MD  sodium bicarbonate 325 MG tablet Take 325 mg by mouth 2 (two) times daily.   Yes Historical Provider, MD  spironolactone (ALDACTONE) 25 MG tablet TAKE 1 TABLET BY MOUTH DAILY. 01/26/16  Yes Herminio Commons, MD  escitalopram (LEXAPRO) 10 MG tablet Take 1 tablet (10 mg total) by mouth daily. Patient not taking: Reported on 05/24/2016 04/08/16   Eustace Quail Tat, DO  HYDROcodone-acetaminophen (NORCO/VICODIN) 5-325 MG tablet Take 1 tablet by mouth every 6 (six) hours as  needed for moderate pain.    Historical Provider, MD  Menthol-Zinc Oxide (CALMOSEPTINE) 0.44-20.6 % OINT Apply 1 application topically 3 (three) times daily. Apply to groin area for protective barrier.    Historical Provider, MD  nystatin (NYSTATIN) powder Apply 1 g topically 4 (four) times daily.    Historical Provider, MD     Family History  Problem Relation Age of Onset  . Psoriasis Brother     Social History   Social History  . Marital status: Divorced    Spouse name: N/A  . Number of children: N/A  . Years of education: N/A   Social History Main Topics  . Smoking status: Never Smoker  . Smokeless tobacco: Never Used  . Alcohol use No  . Drug use: No  . Sexual activity: No   Other Topics Concern  . Not on file   Social History Narrative  . No narrative on file  Review of Systems: A 12 point ROS discussed and pertinent positives are indicated in the HPI above.  All other systems are negative.  Review of Systems  Vital Signs: BP 134/60 (BP Location: Left Arm, Patient Position: Sitting, Cuff Size: Normal)   Pulse (!) 52   Temp 98.2 F (36.8 C) (Oral)   Resp 16   Ht 5\' 3"  (1.6 m)   Wt 150 lb (68 kg)   LMP 08/29/1990   SpO2 97%   BMI 26.57 kg/m   Physical Exam  Targeted exam of the lower extremity demonstrates a healed ulcer at the base of the left great toe area no new wounds on the left. No significant soft tissue swelling. No left-sided intertriginous ulcer. On the right, there is a hardened area at the tip of the right great toe which seems to represent a callous. No other lesions on the right foot including the intertriginous region.  Imaging: US Arterial Seg Multiple  Result Date: 05/24/2016 CLINICAL DATA:  82 year old female with a history of peripheral vascular disease. She has had a left lower extremity wound, with [place] 5 class symptoms of critical limb ischemia. We treated SFA disease with directional atherectomy and drug-eluting balloon  02/16/2015. EXAM: NONINVASIVE PHYSIOLOGIC VASCULAR STUDY OF BILATERAL LOWER EXTREMITIES TECHNIQUE: Evaluation of both lower extremities was performed at rest, including calculation of ankle-brachial indices, multiple segmental pressure evaluation, segmental Doppler and segmental pulse volume recording. COMPARISON:  Most recent noninvasive study 01/28/2016 FINDINGS: Right: Resting ankle brachial index:  0.95 Segmental blood pressure: Symmetric upper extremity pressures. Appropriate increase to the low thigh with noncompressible high thigh pressures. No significant drop from segment a segment. Right great toe measures 108 Doppler: Segmental Doppler of the right lower extremity demonstrates triphasic femoral and popliteal waveform, though dampened. Biphasic waveform below the knee. Pulse volume recording: Segmental PVR of the right lower extremity demonstrates amplitude and waveform quality maintained. Dampening at the right great toe. Left: Resting ankle brachial index: 0.89 Segmental blood pressure: Symmetric upper extremity pressures. Appropriate increase to the low thigh. Noncompressible high thigh. No significant drop from segment a segment. Left great toe measures 93 systolic Doppler: Segmental Doppler of the left lower extremity demonstrates biphasic femoral waveform and posterior tibial waveform. Likely spurious popliteal waveform given the appearance of the posterior tibial. Monophasic dorsalis pedis. , Pulse volume recording: Segmental PVR of the left lower extremity demonstrates quality and amplitude maintained. Augmentation maintained. Waveform dampened at the left great toe. Additional: IMPRESSION: Right: Evidence of vascular disease, though the resting noninvasive study demonstrates no significant arterial occlusive disease. Left: Evidence of vascular disease, with the ABI in the mild range of arterial occlusive disease. Signed, Dulcy Fanny. Earleen Newport, DO [facility]  [facility] Electronically Signed   By: Corrie Mckusick D.O.   On: 05/24/2016 17:20    Labs:  CBC:  Recent Labs  01/28/16 1514 01/28/16 2110 01/29/16 0130 01/29/16 0448 01/29/16 1045 01/30/16 0526  WBC 14.6*  --   --   --   --  9.1  HGB 8.5* 7.8*  --  7.7* 7.8* 7.9*  HCT 27.4* 25.1* 24.4* 24.3* 25.2* 25.3*  PLT 281  --   --   --   --  352    COAGS: No results for input(s): INR, APTT in the last 8760 hours.  BMP:  Recent Labs  01/28/16 1514 01/29/16 0448 01/30/16 0526  NA 129* 135 135  K 5.1 4.6 4.2  CL 94* 99* 99*  CO2 25  27 27  GLUCOSE 300* 107* 97  BUN 48* 46* 31*  CALCIUM 9.3 9.4 9.9  CREATININE 1.88* 1.68* 1.25*  GFRNONAA 23* 27* 38*  GFRAA 27* 31* 44*    LIVER FUNCTION TESTS:  Recent Labs  01/28/16 1514  BILITOT 0.6  AST 19  ALT 8*  ALKPHOS 85  PROT 8.0  ALBUMIN 3.0*    TUMOR MARKERS: No results for input(s): AFPTM, CEA, CA199, CHROMGRNA in the last 8760 hours.  Assessment and Plan:  Ms Mcbratney is an 28 -year-old female with a history of diabetic foot wound on the left base of the great toe, with associated peripheral vascular disease, which we treated 02/16/2015 with angiogram and atherectomy with a drug-eluting balloon at the site of an SFA lesion. Since then her wound is healed with no new left-sided foot wounds. ABI measures just below normal range on the left on today's study.  On the right, she shows me a new callus on the tip of the right great toe. No skin breakdown. This was not previously present. I advised her to have short-term follow-up with a physician or her podiatrist to investigate and make sure there is no further deterioration. At this time, given the normal ABI on the right, I feel that conservative treatment with observation is reasonable. If there is any deterioration, we may consider angiogram of the right lower extremity given her known vascular disease.  In addition, advised her to continue on maximal medical therapy.  Our guidelines recommend annual foot surveillance as well as annual ABI study given her history of diabetic foot wound.  We will schedule an ABI/lower extremity noninvasive exam for 1 year.  Electronically Signed: Corrie Mckusick 05/24/2016, 5:26 PM   I spent a total of    25 Minutes in face to face in clinical consultation, greater than 50% of which was counseling/coordinating care for left foot diabetic foot ulcer, peripheral vascular disease, status post angiogram and endovascular treatment.

## 2016-05-26 DIAGNOSIS — E119 Type 2 diabetes mellitus without complications: Secondary | ICD-10-CM | POA: Diagnosis not present

## 2016-05-26 DIAGNOSIS — B354 Tinea corporis: Secondary | ICD-10-CM | POA: Diagnosis not present

## 2016-05-26 DIAGNOSIS — L404 Guttate psoriasis: Secondary | ICD-10-CM | POA: Diagnosis not present

## 2016-05-26 DIAGNOSIS — G20 Parkinson's disease: Secondary | ICD-10-CM | POA: Diagnosis not present

## 2016-06-04 DIAGNOSIS — Z79899 Other long term (current) drug therapy: Secondary | ICD-10-CM | POA: Diagnosis not present

## 2016-06-04 DIAGNOSIS — D649 Anemia, unspecified: Secondary | ICD-10-CM | POA: Diagnosis not present

## 2016-06-04 DIAGNOSIS — E559 Vitamin D deficiency, unspecified: Secondary | ICD-10-CM | POA: Diagnosis not present

## 2016-06-04 DIAGNOSIS — N19 Unspecified kidney failure: Secondary | ICD-10-CM | POA: Diagnosis not present

## 2016-06-07 DIAGNOSIS — E119 Type 2 diabetes mellitus without complications: Secondary | ICD-10-CM | POA: Diagnosis not present

## 2016-06-07 DIAGNOSIS — D631 Anemia in chronic kidney disease: Secondary | ICD-10-CM | POA: Diagnosis not present

## 2016-06-07 DIAGNOSIS — K219 Gastro-esophageal reflux disease without esophagitis: Secondary | ICD-10-CM | POA: Diagnosis not present

## 2016-06-07 DIAGNOSIS — G20 Parkinson's disease: Secondary | ICD-10-CM | POA: Diagnosis not present

## 2016-06-08 DIAGNOSIS — L03818 Cellulitis of other sites: Secondary | ICD-10-CM | POA: Diagnosis not present

## 2016-06-08 DIAGNOSIS — Z23 Encounter for immunization: Secondary | ICD-10-CM | POA: Diagnosis not present

## 2016-06-11 DIAGNOSIS — K219 Gastro-esophageal reflux disease without esophagitis: Secondary | ICD-10-CM | POA: Diagnosis not present

## 2016-06-11 DIAGNOSIS — E559 Vitamin D deficiency, unspecified: Secondary | ICD-10-CM | POA: Diagnosis not present

## 2016-06-11 DIAGNOSIS — E785 Hyperlipidemia, unspecified: Secondary | ICD-10-CM | POA: Diagnosis not present

## 2016-06-11 DIAGNOSIS — G20 Parkinson's disease: Secondary | ICD-10-CM | POA: Diagnosis not present

## 2016-06-11 DIAGNOSIS — E119 Type 2 diabetes mellitus without complications: Secondary | ICD-10-CM | POA: Diagnosis not present

## 2016-06-11 DIAGNOSIS — D509 Iron deficiency anemia, unspecified: Secondary | ICD-10-CM | POA: Diagnosis not present

## 2016-06-11 DIAGNOSIS — D631 Anemia in chronic kidney disease: Secondary | ICD-10-CM | POA: Diagnosis not present

## 2016-06-11 DIAGNOSIS — E213 Hyperparathyroidism, unspecified: Secondary | ICD-10-CM | POA: Diagnosis not present

## 2016-06-22 DIAGNOSIS — I959 Hypotension, unspecified: Secondary | ICD-10-CM | POA: Diagnosis not present

## 2016-06-22 DIAGNOSIS — R630 Anorexia: Secondary | ICD-10-CM | POA: Diagnosis not present

## 2016-06-22 DIAGNOSIS — E119 Type 2 diabetes mellitus without complications: Secondary | ICD-10-CM | POA: Diagnosis not present

## 2016-06-22 DIAGNOSIS — R110 Nausea: Secondary | ICD-10-CM | POA: Diagnosis not present

## 2016-06-23 DIAGNOSIS — D631 Anemia in chronic kidney disease: Secondary | ICD-10-CM | POA: Diagnosis not present

## 2016-06-23 DIAGNOSIS — N390 Urinary tract infection, site not specified: Secondary | ICD-10-CM | POA: Diagnosis not present

## 2016-06-23 DIAGNOSIS — L03818 Cellulitis of other sites: Secondary | ICD-10-CM | POA: Diagnosis not present

## 2016-06-23 DIAGNOSIS — Z79899 Other long term (current) drug therapy: Secondary | ICD-10-CM | POA: Diagnosis not present

## 2016-06-23 DIAGNOSIS — G20 Parkinson's disease: Secondary | ICD-10-CM | POA: Diagnosis not present

## 2016-06-23 DIAGNOSIS — R319 Hematuria, unspecified: Secondary | ICD-10-CM | POA: Diagnosis not present

## 2016-07-04 DIAGNOSIS — E1142 Type 2 diabetes mellitus with diabetic polyneuropathy: Secondary | ICD-10-CM | POA: Diagnosis not present

## 2016-07-04 DIAGNOSIS — B351 Tinea unguium: Secondary | ICD-10-CM | POA: Diagnosis not present

## 2016-07-04 DIAGNOSIS — L84 Corns and callosities: Secondary | ICD-10-CM | POA: Diagnosis not present

## 2016-07-06 DIAGNOSIS — B354 Tinea corporis: Secondary | ICD-10-CM | POA: Diagnosis not present

## 2016-07-06 DIAGNOSIS — I10 Essential (primary) hypertension: Secondary | ICD-10-CM | POA: Diagnosis not present

## 2016-07-06 DIAGNOSIS — K219 Gastro-esophageal reflux disease without esophagitis: Secondary | ICD-10-CM | POA: Diagnosis not present

## 2016-07-06 DIAGNOSIS — E119 Type 2 diabetes mellitus without complications: Secondary | ICD-10-CM | POA: Diagnosis not present

## 2016-07-14 DIAGNOSIS — Z961 Presence of intraocular lens: Secondary | ICD-10-CM | POA: Diagnosis not present

## 2016-07-14 DIAGNOSIS — H2512 Age-related nuclear cataract, left eye: Secondary | ICD-10-CM | POA: Diagnosis not present

## 2016-07-14 DIAGNOSIS — E1139 Type 2 diabetes mellitus with other diabetic ophthalmic complication: Secondary | ICD-10-CM | POA: Diagnosis not present

## 2016-07-15 DIAGNOSIS — R0989 Other specified symptoms and signs involving the circulatory and respiratory systems: Secondary | ICD-10-CM | POA: Diagnosis not present

## 2016-07-15 DIAGNOSIS — R05 Cough: Secondary | ICD-10-CM | POA: Diagnosis not present

## 2016-07-19 DIAGNOSIS — J159 Unspecified bacterial pneumonia: Secondary | ICD-10-CM | POA: Diagnosis not present

## 2016-07-19 DIAGNOSIS — G20 Parkinson's disease: Secondary | ICD-10-CM | POA: Diagnosis not present

## 2016-07-19 DIAGNOSIS — R0989 Other specified symptoms and signs involving the circulatory and respiratory systems: Secondary | ICD-10-CM | POA: Diagnosis not present

## 2016-07-19 DIAGNOSIS — I509 Heart failure, unspecified: Secondary | ICD-10-CM | POA: Diagnosis not present

## 2016-07-19 DIAGNOSIS — E785 Hyperlipidemia, unspecified: Secondary | ICD-10-CM | POA: Diagnosis not present

## 2016-07-19 DIAGNOSIS — D631 Anemia in chronic kidney disease: Secondary | ICD-10-CM | POA: Diagnosis not present

## 2016-07-19 DIAGNOSIS — R509 Fever, unspecified: Secondary | ICD-10-CM | POA: Diagnosis not present

## 2016-07-19 DIAGNOSIS — I10 Essential (primary) hypertension: Secondary | ICD-10-CM | POA: Diagnosis not present

## 2016-07-19 DIAGNOSIS — E119 Type 2 diabetes mellitus without complications: Secondary | ICD-10-CM | POA: Diagnosis not present

## 2016-07-22 IMAGING — XA IR FEM POP ART ATHERECT INC PTA
1 series · 14 of 24 positions shown · non-contrast
Comparison: none

CLINICAL DATA: 86-year-old female with a history of diabetic wound,
nonhealing, of the left foot overlying the metatarsal head.

[Series 1: run · 14 of 120 slices shown]
[im 1/120]
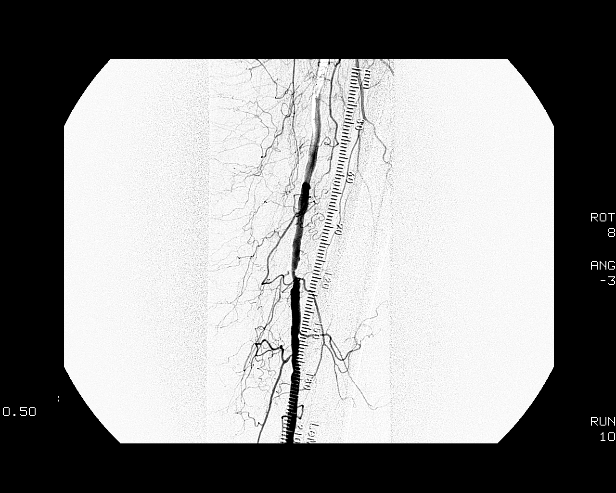
[im 11/120]
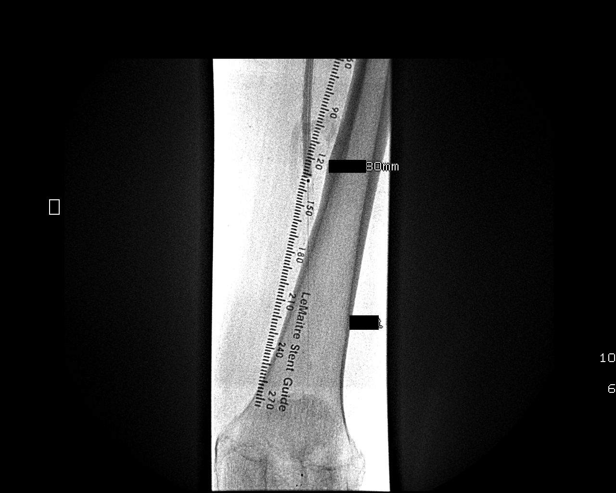
[im 21/120]
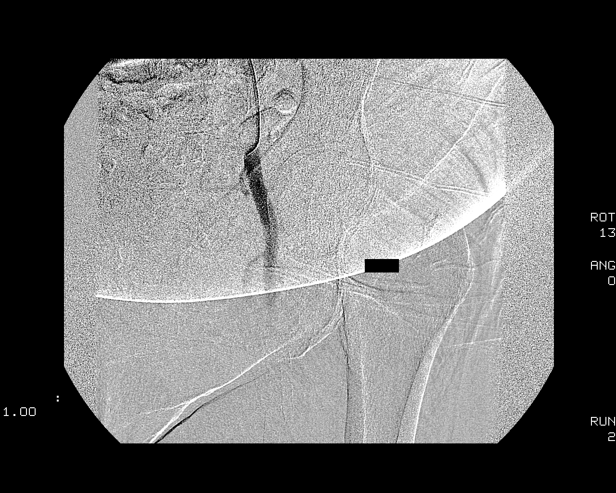
[im 32/120]
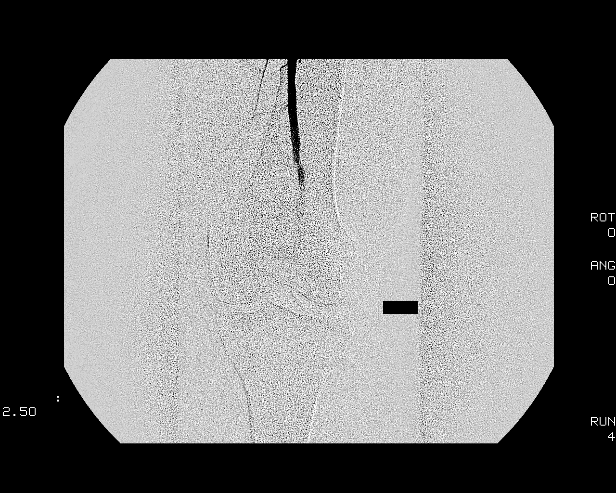
[im 37/120]
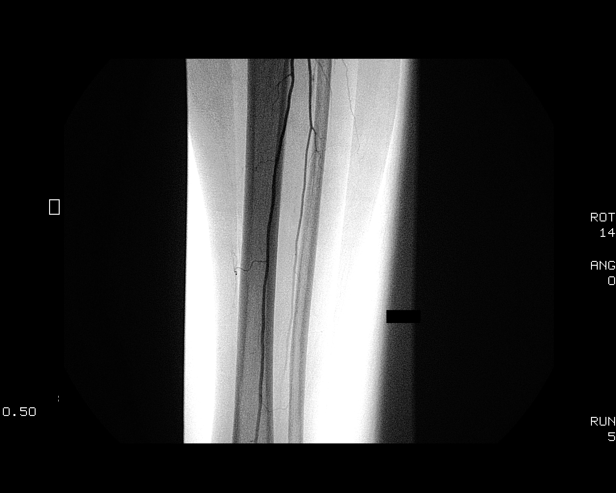
[im 47/120]
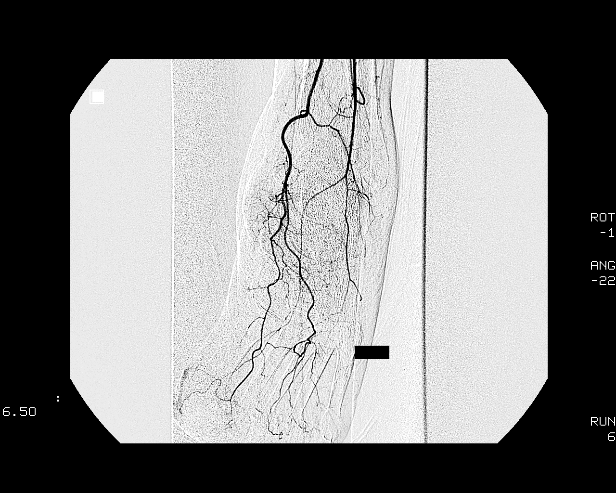
[im 57/120]
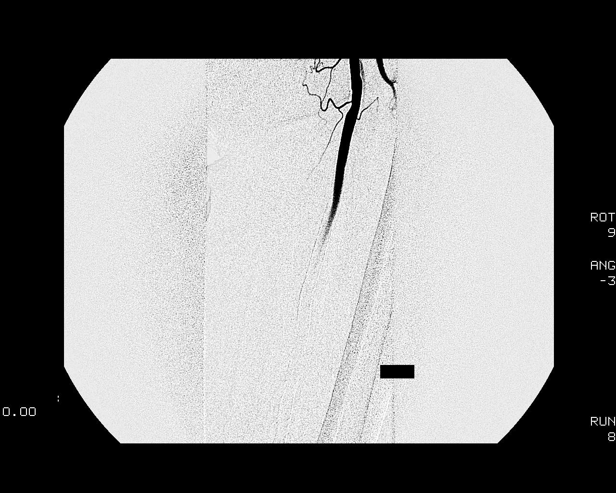
[im 63/120]
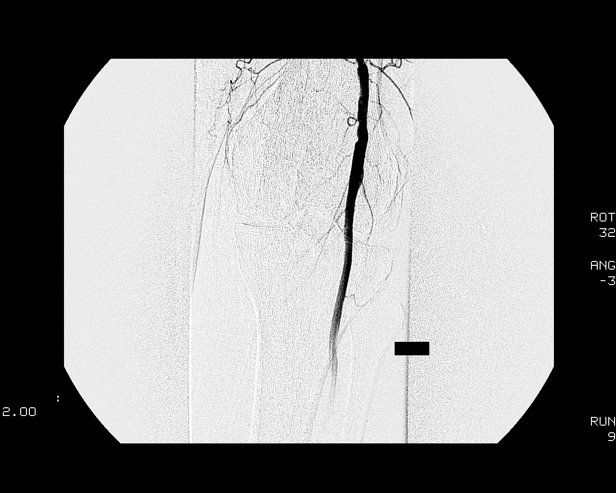
[im 73/120]
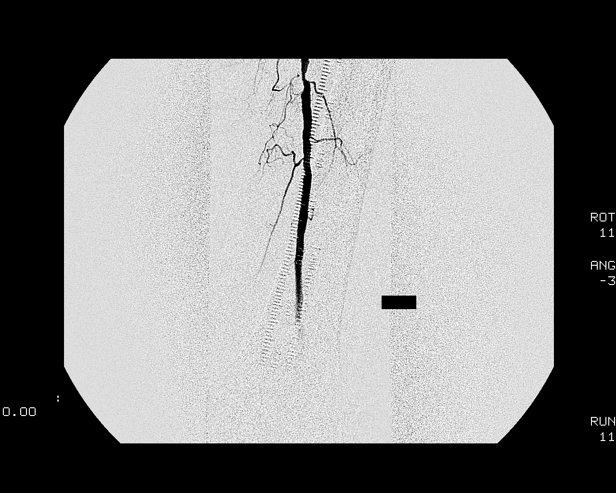
[im 83/120]
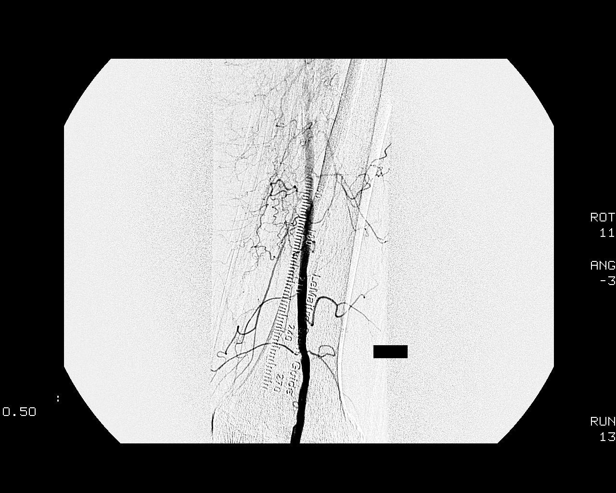
[im 94/120]
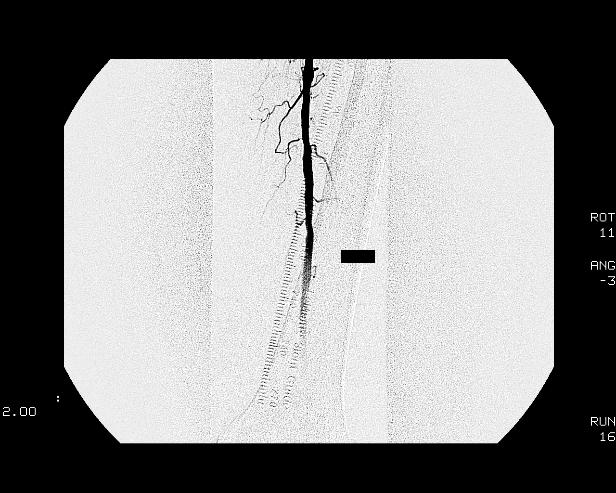
[im 99/120]
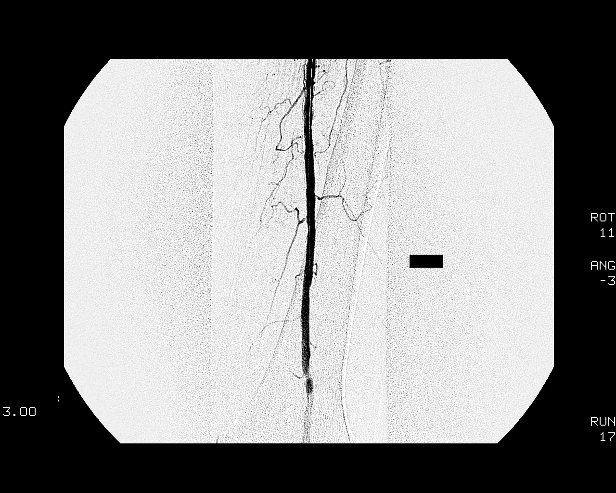
[im 109/120]
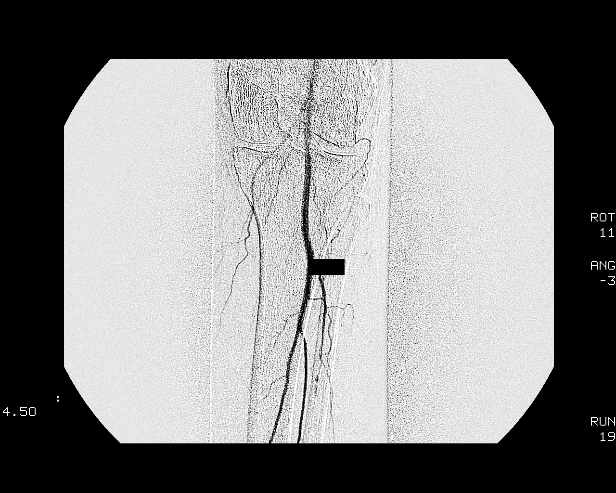
[im 120/120]
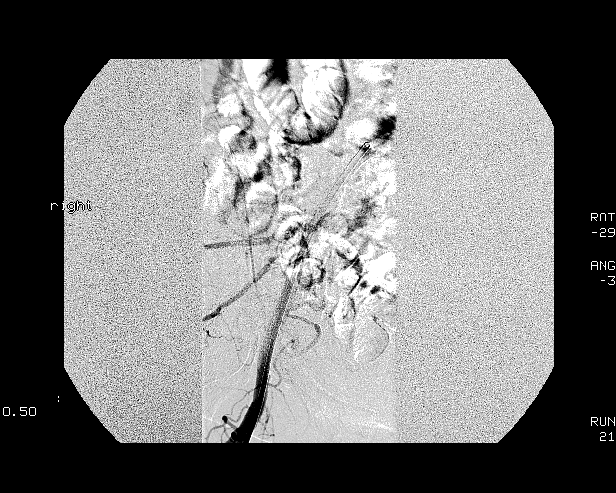

[14 of 24 positions shown; findings below may reference images not displayed]

She has multiple cardiovascular risk factors including diabetes.

Given her [place] 5 class symptoms of critical limb ischemia, she
presents today for revascularization of her left leg, with class 1b
recommendations from the multi disciplinary guidelines recently
published by vascular surgery and podiatric medicine of 5099.

EXAM:
ULTRASOUND GUIDED ACCESS RIGHT COMMON FEMORAL ARTERY.

PELVIC ANGIOGRAM WITH LEFT LOWER EXTREMITY RUNOFF

DISTAL PROTECTION OF THE LOWER EXTREMITY WITH SPIDER WIRE

DIRECTIONAL ATHERECTOMY OF CRITICAL STENOSIS OF THE DISTAL LEFT
SUPERFICIAL FEMORAL ARTERY, WITH DRUG-ELUTING BALLOON TECHNOLOGY FOR
ANTI RE- STENOTIC THERAPY (DAART)

DEPLOYMENT OF 7 FRENCH EXOSEAL FOR HEMOSTASIS.

ANESTHESIA/SEDATION:
0.5 Mg IV Versed; 100 mcg IV Fentanyl.

Total Moderate Sedation Time: 120  Minutes.

Contrast Volume: 100 ml

Additional Medications: 2555 units heparin

FLUOROSCOPY TIME:  23 minutes, 42 seconds Minutes.

PROCEDURE:
The procedure, risks, benefits, and alternatives were explained to
the patient and the patient's family. Questions regarding the
procedure were encouraged and answered. The patient understands and
consents to the procedure.

The right groin was prepped with Betadine in a sterile fashion, and
a sterile drape was applied covering the operative field. A sterile
gown and sterile gloves were used for the procedure. Local
anesthesia was provided with 1% Lidocaine.

A 21 gauge needle was advanced into the right common femoral artery
under direct ultrasound guidance. Ultrasound image documentation was
performed. After establishing guide wire access, a 6-French sheath
was placed.

Pelvic angiogram was performed with an Omni Flush catheter, which
then was used to direct a Bentson wire into the proximal left
superficial femoral artery.

A angled vert catheter was then advanced over the Bentson wire to
the common femoral artery, and a left leg angiogram was performed to
the level of the foot with lateral and frontal images of foot.

The Rosen wire was then advanced into the proximal left SFA, and a
45 cm Arrow braided 7 French sheath was placed in the proximal SFA.

2555 units heparin was administered.

The critical stenosis of the distal left superficial femoral artery
was then targeted for directional atherectomy using the Covidien
HawkOne with extended nose Cone (9.6 cm), after delivery of distal
protection spider wire into the popliteal artery.

After the directional atherectomy was completed, repeat angiogram
was performed.

Overlapping drug-eluting balloon technology was then performed at
the atherectomy site, with 2 separate 5 mm by 8 cm in.pact admiral
balloons. Each was inflated for the 3 minutes required time frame at
low atmospheric pressure.

The distal protection basket was retrieved, and repeat angiogram was
performed.

Limited angiogram of the right common femoral artery was then
performed after withdrawal of the sheath, with exchange for a 10 cm
short sheath and deployment of an Exoseal for assistance with
hemostasis.

The patient tolerated the procedure well and remained
hemodynamically stable throughout.

No complications were encountered and no significant blood loss was
encountered.

COMPLICATIONS:
None.
FINDINGS: Pelvic angiogram demonstrates no evidence of distal aortic aneurysm.
No dissection flap. Mild tortuosity of the bilateral common iliac
arteries. Bilateral hypogastric arteries are patent with normal
course caliber and contour. Atherosclerotic changes are present
within the branches of the bilateral pelvic vessels. L4 and L5
lumbar arteries are patent. Median sacral artery is patent. No
significant stenosis of the iliac system on the left to the right.

Left lower extremity angiogram demonstrates atherosclerotic changes
of the left superficial femoral artery. Profunda femoris remains
patent.

Critical stenosis of the distal left superficial femoral artery with
adjacent atherosclerotic changes. Mild atherosclerotic changes of
the popliteal artery without significant stenosis.

Tibioperoneal trunk is patent. Proximal anterior tibial artery is
patent, with occlusion beyond the proximal third.

Posterior tibial artery is patent, with no significant disease of
the common plantar artery, medial plantar arch, and lateral plantar
arch. In-line flow through the peroneal artery, which reconstitutes
the distal anterior tibial artery and dorsalis pedis. Intact pedal
arch via the native flow of the posterior tibial artery and
reconstitution of the anterior tibial artery.

Status post DAART treatment of critical stenosis of the superficial
femoral artery, there is no flow limiting dissection. Improved
luminal diameter, with no residual stenosis. Maintained flow through
patent posterior tibial artery and peroneal artery to the foot.

Limited angiogram of the common femoral artery demonstrates puncture
of the common femoral artery.
IMPRESSION: Status post pelvic angiogram and left lower extremity angiogram with
treatment of critical stenosis of the left SFA with directional
atherectomy and anti-restenotic therapy with deployment of
overlapping drug-eluting balloon technology.

PLAN:
Patient will be observed for 6 hours with right leg straight to
decrease chance of hemorrhage.

Gentle hydration for renal protection.

The patient will be started on full-dose aspirin p.o. daily postop
day 1. This decision was given her recent discontinuation of
anti-platelet therapy secondary to gastritis. A voice message to her
gastroenterologist office was unanswered on today's date.

The patient will return for postop follow-up 1 week to 3 weeks with
Dr. Tryon in the clinic with a repeat noninvasive examination.

## 2016-07-26 DIAGNOSIS — G9340 Encephalopathy, unspecified: Secondary | ICD-10-CM | POA: Diagnosis not present

## 2016-07-27 DIAGNOSIS — I509 Heart failure, unspecified: Secondary | ICD-10-CM | POA: Diagnosis not present

## 2016-07-27 DIAGNOSIS — E785 Hyperlipidemia, unspecified: Secondary | ICD-10-CM | POA: Diagnosis not present

## 2016-07-27 DIAGNOSIS — I10 Essential (primary) hypertension: Secondary | ICD-10-CM | POA: Diagnosis not present

## 2016-07-27 DIAGNOSIS — D631 Anemia in chronic kidney disease: Secondary | ICD-10-CM | POA: Diagnosis not present

## 2016-08-01 ENCOUNTER — Encounter: Payer: Self-pay | Admitting: Cardiovascular Disease

## 2016-08-01 ENCOUNTER — Ambulatory Visit (INDEPENDENT_AMBULATORY_CARE_PROVIDER_SITE_OTHER): Payer: Medicare Other | Admitting: Cardiovascular Disease

## 2016-08-01 VITALS — BP 145/75 | HR 58 | Ht 63.0 in | Wt 145.0 lb

## 2016-08-01 DIAGNOSIS — I10 Essential (primary) hypertension: Secondary | ICD-10-CM | POA: Diagnosis not present

## 2016-08-01 DIAGNOSIS — G4733 Obstructive sleep apnea (adult) (pediatric): Secondary | ICD-10-CM

## 2016-08-01 DIAGNOSIS — I739 Peripheral vascular disease, unspecified: Secondary | ICD-10-CM | POA: Diagnosis not present

## 2016-08-01 DIAGNOSIS — I2510 Atherosclerotic heart disease of native coronary artery without angina pectoris: Secondary | ICD-10-CM | POA: Diagnosis not present

## 2016-08-01 DIAGNOSIS — I5032 Chronic diastolic (congestive) heart failure: Secondary | ICD-10-CM

## 2016-08-01 DIAGNOSIS — E7800 Pure hypercholesterolemia, unspecified: Secondary | ICD-10-CM

## 2016-08-01 NOTE — Progress Notes (Signed)
SUBJECTIVE: The patient presents for routine follow-up for chronic diastolic heart failure, malignant hypertension, and coronary artery disease. Her blood pressure is reasonably controlled. She denies chest pain and shortness of breath. She underwent directional atherectomy and percutaneous intervention for critical left SFA stenosis in June 2016.  She was hospitalized for acute on chronic diastolic heart failure in June 2017. She was discharged on Lasix 40 mg daily.  Echocardiogram 01/29/16: Normal left ventricular systolic function, LVEF [id number], mild mitral regurgitation.   Review of Systems: As per "subjective", otherwise negative.  Allergies  Allergen Reactions  . Ivp Dye [Iodinated Diagnostic Agents] Anaphylaxis  . Rocephin [Ceftriaxone] Diarrhea and Nausea And Vomiting    Current Outpatient Prescriptions  Medication Sig Dispense Refill  . acetaminophen (TYLENOL) 325 MG tablet Take 650 mg by mouth every 6 (six) hours as needed.    Marland Kitchen amLODipine (NORVASC) 5 MG tablet Take 5 mg by mouth daily.    Marland Kitchen aspirin 325 MG tablet Take 325 mg by mouth daily.    Marland Kitchen atorvastatin (LIPITOR) 20 MG tablet Take 1 tablet (20 mg total) by mouth at bedtime. 30 tablet 6  . carbidopa-levodopa (SINEMET IR) 25-100 MG tablet 1 tablet at 8 am, 1 tablet at 12 pm, 1 tablet at 5 pm 90 tablet 5  . cholecalciferol (VITAMIN D) 1000 units tablet Take 1,000 Units by mouth daily.    Marland Kitchen escitalopram (LEXAPRO) 10 MG tablet Take 1 tablet (10 mg total) by mouth daily. 90 tablet 1  . folic acid (FOLVITE) 1 MG tablet TAKE 1 TABLET BY MOUTH DAILY. 30 tablet 3  . furosemide (LASIX) 40 MG tablet Take 40 mg by mouth daily.    Marland Kitchen gabapentin (NEURONTIN) 100 MG capsule Take 2 capsules (200 mg total) by mouth 2 (two) times daily. 120 capsule 0  . hydrALAZINE (APRESOLINE) 50 MG tablet TAKE 1 TABLET BY MOUTH 2 TIMES DAILY. 60 tablet 0  . metFORMIN (GLUCOPHAGE) 500 MG tablet Take 500 mg by mouth 2 (two) times daily with a meal.      . mometasone (ELOCON) 0.1 % cream Apply 1 application topically 3 (three) times daily.    . Multiple Vitamins-Minerals (MULTIVITAMINS THER. W/MINERALS) TABS tablet Take 1 tablet by mouth daily.    Marland Kitchen omeprazole (PRILOSEC) 20 MG capsule Take 20 mg by mouth daily.    . sodium bicarbonate 325 MG tablet Take 325 mg by mouth 2 (two) times daily.    Marland Kitchen spironolactone (ALDACTONE) 25 MG tablet TAKE 1 TABLET BY MOUTH DAILY. 30 tablet 0   No current facility-administered medications for this visit.     Past Medical History:  Diagnosis Date  . Acute renal insufficiency    09/2011. not on ACEI secondary to this (also has renal artery stenosis)  . Arthritis   . CAD (coronary artery disease)    NSTEMI 09/2011 felt poor candidate for CABG, instead s/p PTCA/DES to mid RCA 10/17/11  . Cellulitis   . CHF (congestive heart failure) (HCC)    Diastolic. EF 55-65% by cath 10/11/11  . Chronic back pain   . Chronic knee pain   . Chronic respiratory failure ([place])   . CKD (chronic kidney disease) stage 2, GFR 60-89 ml/min 07/11/2013  . Diabetes mellitus    insulin dependent  . GERD (gastroesophageal reflux disease)   . Hyperlipidemia   . Hypertension   . Mitral regurgitation   . Morbid obesity ([place])   . Nephrolithiasis   . Obstructive sleep apnea   .  Peripheral vascular disease ([place])    severe right renal artery stenosis and left SFA stenosis by PV angio 09/2011, treated medically  . Psoriasis   . Pulmonary hypertension   . Radiculopathy    lumbar  . Sleep apnea    uses cpap  . Vitamin D deficiency     Past Surgical History:  Procedure Laterality Date  . ABDOMINAL HYSTERECTOMY    . ACROMIO-CLAVICULAR JOINT REPAIR  2011  . EYE SURGERY     cataract  OD  . LEFT AND RIGHT HEART CATHETERIZATION WITH CORONARY ANGIOGRAM N/A 10/11/2011   Procedure: LEFT AND RIGHT HEART CATHETERIZATION WITH CORONARY ANGIOGRAM;  Surgeon: Sherren Mocha, MD;  Location: [facility] CATH LAB;  Service: Cardiovascular;  Laterality: N/A;   . LOWER EXTREMITY ANGIOGRAM N/A 10/11/2011   Procedure: LOWER EXTREMITY ANGIOGRAM;  Surgeon: Sherren Mocha, MD;  Location: [facility] CATH LAB;  Service: Cardiovascular;  Laterality: N/A;  . PERCUTANEOUS CORONARY STENT INTERVENTION (PCI-S) N/A 10/17/2011   Procedure: PERCUTANEOUS CORONARY STENT INTERVENTION (PCI-S);  Surgeon: Burnell Blanks, MD;  Location: [facility] CATH LAB;  Service: Cardiovascular;  Laterality: N/A;    Social History   Social History  . Marital status: Divorced    Spouse name: N/A  . Number of children: N/A  . Years of education: N/A   Occupational History  . Not on file.   Social History Main Topics  . Smoking status: Never Smoker  . Smokeless tobacco: Never Used  . Alcohol use No  . Drug use: No  . Sexual activity: No   Other Topics Concern  . Not on file   Social History Narrative  . No narrative on file     Vitals:   08/01/16 1459  BP: (!) 145/75  Pulse: (!) 58  Weight: 145 lb (65.8 kg)  Height: 5\' 3"  (1.6 m)    PHYSICAL EXAM General: NAD Neck: No JVD, no thyromegaly.  Lungs: Clear to auscultation bilaterally, no wheezes or rales. CV: Regular rate and rhythm, normal S1/S2, no S3/S4, soft I/VI systolic murmur along left sternal border. No pretibial edema. Abdomen: Soft, obese, nontender,no distention.  Neurologic: Alert. Psych: Normal affect.    ECG: Most recent ECG reviewed.      ASSESSMENT AND PLAN: 1. Chronic diastolic heart failure: Compensated and euvolemic. Takes Lasix 40 mg daily. Continue hydralazine and spironolactone.   2. Malignant hypertension: Reasonably controlled for age. She is not on an ACEI reportedly because of severe right renal artery stenosis. Continue amlodipine 5 mg , hydralazine 50 mg two times a day, and spironolactone 25 mg.   3. CAD: Symptomatically stable. Resting nuclear images in 06/2014 did not show ischemia, and showed thinning in the basal anterolateral wall and inferolateral wall.  Has h/o PCI to  RCA in 2013, with cath demonstrating 2-vessel disease in the RCA and with severe left circumflex disease (serial 90-95% mid LCx lesions by cath on 10-11-2011). Repeat nuclear myocardial perfusion study stress was normal in November of 2014. Continue ASA and atorvastatin. Not on beta blocker due to bradycardia.  4. OSA: CPAP.  5. PVD: Severe right renal artery stenosis and underwent directional atherectomy and percutaneous intervention for left SFA critical stenosis in 01/2015.   6. Hyperlipidemia: On Lipitor 20 mg daily.   Dispo: f/u 6 months.   Kate Sable, M.D., F.A.C.C.

## 2016-08-01 NOTE — Patient Instructions (Signed)

## 2016-08-15 DIAGNOSIS — L853 Xerosis cutis: Secondary | ICD-10-CM | POA: Diagnosis not present

## 2016-08-15 DIAGNOSIS — E785 Hyperlipidemia, unspecified: Secondary | ICD-10-CM | POA: Diagnosis not present

## 2016-08-15 DIAGNOSIS — E119 Type 2 diabetes mellitus without complications: Secondary | ICD-10-CM | POA: Diagnosis not present

## 2016-08-15 DIAGNOSIS — D631 Anemia in chronic kidney disease: Secondary | ICD-10-CM | POA: Diagnosis not present

## 2016-08-15 DIAGNOSIS — L400 Psoriasis vulgaris: Secondary | ICD-10-CM | POA: Diagnosis not present

## 2016-08-15 DIAGNOSIS — Z79899 Other long term (current) drug therapy: Secondary | ICD-10-CM | POA: Diagnosis not present

## 2016-08-16 DIAGNOSIS — E119 Type 2 diabetes mellitus without complications: Secondary | ICD-10-CM | POA: Diagnosis not present

## 2016-08-16 DIAGNOSIS — G20 Parkinson's disease: Secondary | ICD-10-CM | POA: Diagnosis not present

## 2016-08-16 DIAGNOSIS — D631 Anemia in chronic kidney disease: Secondary | ICD-10-CM | POA: Diagnosis not present

## 2016-08-16 DIAGNOSIS — I10 Essential (primary) hypertension: Secondary | ICD-10-CM | POA: Diagnosis not present

## 2016-09-01 ENCOUNTER — Encounter: Payer: Self-pay | Admitting: Interventional Radiology

## 2016-09-05 DIAGNOSIS — G20 Parkinson's disease: Secondary | ICD-10-CM | POA: Diagnosis not present

## 2016-09-05 DIAGNOSIS — I10 Essential (primary) hypertension: Secondary | ICD-10-CM | POA: Diagnosis not present

## 2016-09-05 DIAGNOSIS — M1712 Unilateral primary osteoarthritis, left knee: Secondary | ICD-10-CM | POA: Diagnosis not present

## 2016-09-05 DIAGNOSIS — E119 Type 2 diabetes mellitus without complications: Secondary | ICD-10-CM | POA: Diagnosis not present

## 2016-09-05 DIAGNOSIS — R531 Weakness: Secondary | ICD-10-CM | POA: Diagnosis not present

## 2016-09-05 DIAGNOSIS — M25562 Pain in left knee: Secondary | ICD-10-CM | POA: Diagnosis not present

## 2016-09-09 DIAGNOSIS — L84 Corns and callosities: Secondary | ICD-10-CM | POA: Diagnosis not present

## 2016-09-09 DIAGNOSIS — B351 Tinea unguium: Secondary | ICD-10-CM | POA: Diagnosis not present

## 2016-09-09 DIAGNOSIS — E1142 Type 2 diabetes mellitus with diabetic polyneuropathy: Secondary | ICD-10-CM | POA: Diagnosis not present

## 2016-09-13 DIAGNOSIS — R0989 Other specified symptoms and signs involving the circulatory and respiratory systems: Secondary | ICD-10-CM | POA: Diagnosis not present

## 2016-09-13 DIAGNOSIS — R05 Cough: Secondary | ICD-10-CM | POA: Diagnosis not present

## 2016-09-13 DIAGNOSIS — I509 Heart failure, unspecified: Secondary | ICD-10-CM | POA: Diagnosis not present

## 2016-09-13 DIAGNOSIS — J069 Acute upper respiratory infection, unspecified: Secondary | ICD-10-CM | POA: Diagnosis not present

## 2016-10-14 DIAGNOSIS — K219 Gastro-esophageal reflux disease without esophagitis: Secondary | ICD-10-CM | POA: Diagnosis not present

## 2016-10-14 DIAGNOSIS — I10 Essential (primary) hypertension: Secondary | ICD-10-CM | POA: Diagnosis not present

## 2016-10-14 DIAGNOSIS — E119 Type 2 diabetes mellitus without complications: Secondary | ICD-10-CM | POA: Diagnosis not present

## 2016-10-14 DIAGNOSIS — G20 Parkinson's disease: Secondary | ICD-10-CM | POA: Diagnosis not present

## 2016-10-25 DIAGNOSIS — E1159 Type 2 diabetes mellitus with other circulatory complications: Secondary | ICD-10-CM | POA: Diagnosis not present

## 2016-10-25 DIAGNOSIS — B351 Tinea unguium: Secondary | ICD-10-CM | POA: Diagnosis not present

## 2016-10-25 DIAGNOSIS — L84 Corns and callosities: Secondary | ICD-10-CM | POA: Diagnosis not present

## 2016-10-25 DIAGNOSIS — G20 Parkinson's disease: Secondary | ICD-10-CM | POA: Diagnosis not present

## 2016-11-21 DIAGNOSIS — L853 Xerosis cutis: Secondary | ICD-10-CM | POA: Diagnosis not present

## 2016-11-21 DIAGNOSIS — L400 Psoriasis vulgaris: Secondary | ICD-10-CM | POA: Diagnosis not present

## 2016-12-05 DIAGNOSIS — K529 Noninfective gastroenteritis and colitis, unspecified: Secondary | ICD-10-CM | POA: Diagnosis not present

## 2016-12-13 DIAGNOSIS — E785 Hyperlipidemia, unspecified: Secondary | ICD-10-CM | POA: Diagnosis not present

## 2016-12-13 DIAGNOSIS — T466X5A Adverse effect of antihyperlipidemic and antiarteriosclerotic drugs, initial encounter: Secondary | ICD-10-CM | POA: Diagnosis not present

## 2016-12-13 DIAGNOSIS — G20 Parkinson's disease: Secondary | ICD-10-CM | POA: Diagnosis not present

## 2016-12-13 DIAGNOSIS — M791 Myalgia: Secondary | ICD-10-CM | POA: Diagnosis not present

## 2016-12-13 DIAGNOSIS — R5383 Other fatigue: Secondary | ICD-10-CM | POA: Diagnosis not present

## 2016-12-13 DIAGNOSIS — I10 Essential (primary) hypertension: Secondary | ICD-10-CM | POA: Diagnosis not present

## 2016-12-13 DIAGNOSIS — D631 Anemia in chronic kidney disease: Secondary | ICD-10-CM | POA: Diagnosis not present

## 2016-12-13 DIAGNOSIS — E119 Type 2 diabetes mellitus without complications: Secondary | ICD-10-CM | POA: Diagnosis not present

## 2016-12-13 DIAGNOSIS — D649 Anemia, unspecified: Secondary | ICD-10-CM | POA: Diagnosis not present

## 2016-12-19 DIAGNOSIS — L84 Corns and callosities: Secondary | ICD-10-CM | POA: Diagnosis not present

## 2016-12-19 DIAGNOSIS — B351 Tinea unguium: Secondary | ICD-10-CM | POA: Diagnosis not present

## 2016-12-19 DIAGNOSIS — E1142 Type 2 diabetes mellitus with diabetic polyneuropathy: Secondary | ICD-10-CM | POA: Diagnosis not present

## 2016-12-19 DIAGNOSIS — M79676 Pain in unspecified toe(s): Secondary | ICD-10-CM | POA: Diagnosis not present

## 2016-12-20 DIAGNOSIS — I10 Essential (primary) hypertension: Secondary | ICD-10-CM | POA: Diagnosis not present

## 2016-12-20 DIAGNOSIS — E872 Acidosis: Secondary | ICD-10-CM | POA: Diagnosis not present

## 2016-12-20 DIAGNOSIS — R809 Proteinuria, unspecified: Secondary | ICD-10-CM | POA: Diagnosis not present

## 2016-12-20 DIAGNOSIS — D649 Anemia, unspecified: Secondary | ICD-10-CM | POA: Diagnosis not present

## 2016-12-20 DIAGNOSIS — E1129 Type 2 diabetes mellitus with other diabetic kidney complication: Secondary | ICD-10-CM | POA: Diagnosis not present

## 2016-12-20 DIAGNOSIS — N179 Acute kidney failure, unspecified: Secondary | ICD-10-CM | POA: Diagnosis not present

## 2017-01-05 DIAGNOSIS — E1139 Type 2 diabetes mellitus with other diabetic ophthalmic complication: Secondary | ICD-10-CM | POA: Diagnosis not present

## 2017-01-05 DIAGNOSIS — H2512 Age-related nuclear cataract, left eye: Secondary | ICD-10-CM | POA: Diagnosis not present

## 2017-01-05 DIAGNOSIS — Z961 Presence of intraocular lens: Secondary | ICD-10-CM | POA: Diagnosis not present

## 2017-01-16 DIAGNOSIS — L400 Psoriasis vulgaris: Secondary | ICD-10-CM | POA: Diagnosis not present

## 2017-01-16 DIAGNOSIS — L853 Xerosis cutis: Secondary | ICD-10-CM | POA: Diagnosis not present

## 2017-01-17 DIAGNOSIS — F329 Major depressive disorder, single episode, unspecified: Secondary | ICD-10-CM | POA: Diagnosis not present

## 2017-02-10 DIAGNOSIS — G20 Parkinson's disease: Secondary | ICD-10-CM | POA: Diagnosis not present

## 2017-02-10 DIAGNOSIS — K219 Gastro-esophageal reflux disease without esophagitis: Secondary | ICD-10-CM | POA: Diagnosis not present

## 2017-02-10 DIAGNOSIS — I10 Essential (primary) hypertension: Secondary | ICD-10-CM | POA: Diagnosis not present

## 2017-02-10 DIAGNOSIS — E119 Type 2 diabetes mellitus without complications: Secondary | ICD-10-CM | POA: Diagnosis not present

## 2017-02-11 DIAGNOSIS — E119 Type 2 diabetes mellitus without complications: Secondary | ICD-10-CM | POA: Diagnosis not present

## 2017-02-11 DIAGNOSIS — D631 Anemia in chronic kidney disease: Secondary | ICD-10-CM | POA: Diagnosis not present

## 2017-02-11 DIAGNOSIS — E785 Hyperlipidemia, unspecified: Secondary | ICD-10-CM | POA: Diagnosis not present

## 2017-02-11 DIAGNOSIS — Z79899 Other long term (current) drug therapy: Secondary | ICD-10-CM | POA: Diagnosis not present

## 2017-03-27 DIAGNOSIS — B351 Tinea unguium: Secondary | ICD-10-CM | POA: Diagnosis not present

## 2017-03-27 DIAGNOSIS — L84 Corns and callosities: Secondary | ICD-10-CM | POA: Diagnosis not present

## 2017-03-27 DIAGNOSIS — M79676 Pain in unspecified toe(s): Secondary | ICD-10-CM | POA: Diagnosis not present

## 2017-03-27 DIAGNOSIS — E1142 Type 2 diabetes mellitus with diabetic polyneuropathy: Secondary | ICD-10-CM | POA: Diagnosis not present

## 2017-04-11 DIAGNOSIS — F329 Major depressive disorder, single episode, unspecified: Secondary | ICD-10-CM | POA: Diagnosis not present

## 2017-04-18 DIAGNOSIS — G20 Parkinson's disease: Secondary | ICD-10-CM | POA: Diagnosis not present

## 2017-04-18 DIAGNOSIS — I10 Essential (primary) hypertension: Secondary | ICD-10-CM | POA: Diagnosis not present

## 2017-04-18 DIAGNOSIS — K219 Gastro-esophageal reflux disease without esophagitis: Secondary | ICD-10-CM | POA: Diagnosis not present

## 2017-04-18 DIAGNOSIS — E119 Type 2 diabetes mellitus without complications: Secondary | ICD-10-CM | POA: Diagnosis not present

## 2017-04-24 DIAGNOSIS — G9341 Metabolic encephalopathy: Secondary | ICD-10-CM | POA: Diagnosis not present

## 2017-04-24 DIAGNOSIS — R10819 Abdominal tenderness, unspecified site: Secondary | ICD-10-CM | POA: Diagnosis not present

## 2017-04-24 DIAGNOSIS — R300 Dysuria: Secondary | ICD-10-CM | POA: Diagnosis not present

## 2017-04-24 DIAGNOSIS — L400 Psoriasis vulgaris: Secondary | ICD-10-CM | POA: Diagnosis not present

## 2017-04-24 DIAGNOSIS — L853 Xerosis cutis: Secondary | ICD-10-CM | POA: Diagnosis not present

## 2017-04-26 DIAGNOSIS — Z79899 Other long term (current) drug therapy: Secondary | ICD-10-CM | POA: Diagnosis not present

## 2017-04-26 DIAGNOSIS — R319 Hematuria, unspecified: Secondary | ICD-10-CM | POA: Diagnosis not present

## 2017-04-26 DIAGNOSIS — N390 Urinary tract infection, site not specified: Secondary | ICD-10-CM | POA: Diagnosis not present

## 2017-04-28 DIAGNOSIS — N390 Urinary tract infection, site not specified: Secondary | ICD-10-CM | POA: Diagnosis not present

## 2017-04-28 DIAGNOSIS — R300 Dysuria: Secondary | ICD-10-CM | POA: Diagnosis not present

## 2017-05-15 ENCOUNTER — Other Ambulatory Visit: Payer: Self-pay | Admitting: Interventional Radiology

## 2017-05-15 DIAGNOSIS — I739 Peripheral vascular disease, unspecified: Secondary | ICD-10-CM

## 2017-05-19 DIAGNOSIS — E119 Type 2 diabetes mellitus without complications: Secondary | ICD-10-CM | POA: Diagnosis not present

## 2017-05-19 DIAGNOSIS — Z79899 Other long term (current) drug therapy: Secondary | ICD-10-CM | POA: Diagnosis not present

## 2017-06-01 DIAGNOSIS — J069 Acute upper respiratory infection, unspecified: Secondary | ICD-10-CM | POA: Diagnosis not present

## 2017-06-05 DIAGNOSIS — L84 Corns and callosities: Secondary | ICD-10-CM | POA: Diagnosis not present

## 2017-06-05 DIAGNOSIS — B351 Tinea unguium: Secondary | ICD-10-CM | POA: Diagnosis not present

## 2017-06-05 DIAGNOSIS — M79676 Pain in unspecified toe(s): Secondary | ICD-10-CM | POA: Diagnosis not present

## 2017-06-05 DIAGNOSIS — E1142 Type 2 diabetes mellitus with diabetic polyneuropathy: Secondary | ICD-10-CM | POA: Diagnosis not present

## 2017-06-08 ENCOUNTER — Ambulatory Visit: Payer: Medicare Other | Admitting: Cardiovascular Disease

## 2017-06-13 DIAGNOSIS — G20 Parkinson's disease: Secondary | ICD-10-CM | POA: Diagnosis not present

## 2017-06-13 DIAGNOSIS — I10 Essential (primary) hypertension: Secondary | ICD-10-CM | POA: Diagnosis not present

## 2017-06-13 DIAGNOSIS — E119 Type 2 diabetes mellitus without complications: Secondary | ICD-10-CM | POA: Diagnosis not present

## 2017-06-13 DIAGNOSIS — K219 Gastro-esophageal reflux disease without esophagitis: Secondary | ICD-10-CM | POA: Diagnosis not present

## 2017-06-20 DIAGNOSIS — L853 Xerosis cutis: Secondary | ICD-10-CM | POA: Diagnosis not present

## 2017-06-20 DIAGNOSIS — L218 Other seborrheic dermatitis: Secondary | ICD-10-CM | POA: Diagnosis not present

## 2017-06-20 DIAGNOSIS — L400 Psoriasis vulgaris: Secondary | ICD-10-CM | POA: Diagnosis not present

## 2017-06-28 ENCOUNTER — Ambulatory Visit
Admission: RE | Admit: 2017-06-28 | Discharge: 2017-06-28 | Disposition: A | Discharge status: Home / Self Care | Payer: Medicare Other | Source: Ambulatory Visit | Attending: Interventional Radiology | Admitting: Interventional Radiology

## 2017-06-28 DIAGNOSIS — I739 Peripheral vascular disease, unspecified: Secondary | ICD-10-CM | POA: Diagnosis not present

## 2017-06-28 DIAGNOSIS — E11621 Type 2 diabetes mellitus with foot ulcer: Secondary | ICD-10-CM | POA: Diagnosis not present

## 2017-06-28 DIAGNOSIS — Z9889 Other specified postprocedural states: Secondary | ICD-10-CM | POA: Diagnosis not present

## 2017-06-28 DIAGNOSIS — L97529 Non-pressure chronic ulcer of other part of left foot with unspecified severity: Secondary | ICD-10-CM | POA: Diagnosis not present

## 2017-06-28 HISTORY — PX: IR RADIOLOGIST EVAL & MGMT: IMG5224

## 2017-06-28 NOTE — Progress Notes (Signed)
Chief Complaint: Diabetic foot ulcer  Referring Physician(s): Dr. Irving Shows, [facility], [place]  Patient is now seeing Dr. Steffanie Rainwater, with [facility] in [place], [place].   Fax: [phone] Phone: [phone]  History of Present Illness: Theresa Mclean is a 83 y.o. female presenting as a scheduled appointment today [facility], as a follow up for her PAD/CLI.    She has a history of a left foot wound, diabetic ulcer, at the base of her left great toe at the metatarsal head.  She was first evaluated April 2016.    We performed an angiogram with directional atherectomy and anti-restenotic therapy with drug-eluting balloon treatment of a single lesion in the left superficial femoral artery 02/16/2015.  We last saw Ms Auxier and her daughter in the clinic 05/24/2016.  She had interval healing at the time.  She has not had any new wounds.  She denies any foot or calf pain at rest.  She has had no right sided problems.   She does report that her podiatrist, Dr. Irving Shows, recommended that her current living facility float her sheets in the bed because of some concern for the early signs of pressure ulcers on her toes.  This has been performed, and she has no current wounds.   She continues maximal medical care at her facility, with good glucose control, 3110m ASA daily, and blood pressure control.  Her current medicine list reflects that she is not on lipitor/crestor, or other anti-lipid.   She is non-smoker.    Non-invasive exam today shows that the ABI on the left and the right are in the normal range.  Her waveforms of the tibial vessels are not normal, but there is adequate flow by the non-invasive and on my physical exam.      Past Medical History:  Diagnosis Date  . Acute renal insufficiency    09/2011. not on ACEI secondary to this (also has renal artery stenosis)  . Arthritis   . CAD (coronary artery disease)    NSTEMI 09/2011 felt poor candidate for CABG, instead s/p PTCA/DES to mid RCA 10/17/11  . Cellulitis   . CHF (congestive heart failure) (HCC)    Diastolic. EF 55-65% by cath 10/11/11  . Chronic back pain   . Chronic knee pain   . Chronic respiratory failure ([place]   . CKD (chronic kidney disease) stage 2, GFR 60-89 ml/min 07/11/2013  . Diabetes mellitus    insulin dependent  . GERD (gastroesophageal reflux disease)   . Hyperlipidemia   . Hypertension   . Mitral regurgitation   . Morbid obesity ([place]   . Nephrolithiasis   . Obstructive sleep apnea   . [place]    severe right renal artery stenosis and left SFA stenosis by PV angio 09/2011, treated medically  . Psoriasis   . Pulmonary hypertension   . Radiculopathy    lumbar  . Sleep apnea    uses cpap  . Vitamin D deficiency     Past Surgical History:  Procedure Laterality Date  . ABDOMINAL HYSTERECTOMY    . ACROMIO-CLAVICULAR JOINT REPAIR  2011  . EYE SURGERY     cataract  OD  . IR GENERIC HISTORICAL  05/24/2016   IR RADIOLOGIST EVAL & MGMT 05/24/2016 JCorrie Mckusick DO [facility] INTERV RAD  . LEFT AND RIGHT HEART CATHETERIZATION WITH CORONARY ANGIOGRAM N/A 10/11/2011   Procedure: LEFT AND RIGHT HEART CATHETERIZATION WITH CORONARY ANGIOGRAM;  Surgeon: MLegrand Como  Burt Knack, MD;  Location[facility] CATH LAB;  Service: Cardiovascular;  Laterality: N/A;  . LOWER EXTREMITY ANGIOGRAM N/A 10/11/2011   Procedure: LOWER EXTREMITY ANGIOGRAM;  Surgeon: Sherren Mocha, MD;  Location: [facility] CATH LAB;  Service: Cardiovascular;  Laterality: N/A;  . PERCUTANEOUS CORONARY STENT INTERVENTION (PCI-S) N/A 10/17/2011   Procedure: PERCUTANEOUS CORONARY STENT INTERVENTION (PCI-S);  Surgeon: Burnell Blanks, MD;  Location: [facility] CATH LAB;  Service: Cardiovascular;  Laterality: N/A;    Allergies: Ivp dye [iodinated diagnostic agents] and Rocephin [ceftriaxone]  Medications: Prior to Admission medications   Medication Sig Start Date End Date  Taking? Authorizing Provider  acetaminophen (TYLENOL) 325 MG tablet Take 650 mg by mouth every 6 (six) hours as needed.   Yes [provider]  amLODipine (NORVASC) 5 MG tablet Take 5 mg by mouth daily.   Yes [provider]  ammonium lactate (AMLACTIN) 12 % cream Apply topically 2 (two) times daily as needed for dry skin.   Yes [provider]  aspirin 325 MG tablet Take 325 mg by mouth daily.   Yes [provider]  carbidopa-levodopa (SINEMET IR) 25-100 MG tablet 1 tablet at 8 am, 1 tablet at 12 pm, 1 tablet at 5 pm 09/08/15  Yes Tat, Eustace Quail, DO  cholecalciferol (VITAMIN D) 1000 units tablet Take 1,000 Units by mouth daily.   Yes [provider]  clobetasol (TEMOVATE) 0.05 % external solution Apply 1 application topically 2 (two) times daily.   Yes [provider]  coal tar (NEUTROGENA T-GEL) 0.5 % shampoo Apply topically at bedtime as needed. Applty topically Tues, Thur & Sat on evening shift.   Yes [provider]  escitalopram (LEXAPRO) 10 MG tablet Take 1 tablet (10 mg total) by mouth daily. 04/08/16  Yes Tat, Eustace Quail, DO  folic acid (FOLVITE) 1 MG tablet TAKE 1 TABLET BY MOUTH DAILY. 11/25/15  Yes Herminio Commons, MD  furosemide (LASIX) 40 MG tablet Take 40 mg by mouth daily.   Yes [provider]  gabapentin (NEURONTIN) 100 MG capsule Take 2 capsules (200 mg total) by mouth 2 (two) times daily. 07/14/14  Yes Vann, Jessica U, DO  hydrALAZINE (APRESOLINE) 50 MG tablet TAKE 1 TABLET BY MOUTH 2 TIMES DAILY. 01/26/16  Yes Herminio Commons, MD  Ketoconazole-Hydrocortisone 2 & 1 % KIT Apply 1 application topically daily.   Yes [provider]  metFORMIN (GLUCOPHAGE) 500 MG tablet Take 500 mg by mouth 2 (two) times daily with a meal.   Yes [provider]  Multiple Vitamins-Minerals (MULTIVITAMINS THER. W/MINERALS) TABS tablet Take 1 tablet by mouth daily.   Yes [provider]  omeprazole  (PRILOSEC) 20 MG capsule Take 20 mg by mouth daily.   Yes [provider]  spironolactone (ALDACTONE) 25 MG tablet TAKE 1 TABLET BY MOUTH DAILY. 01/26/16  Yes Herminio Commons, MD  triamcinolone cream (KENALOG) 0.1 % Apply 1 application topically 2 (two) times daily.   Yes [provider]  atorvastatin (LIPITOR) 20 MG tablet Take 1 tablet (20 mg total) by mouth at bedtime. Patient not taking: Reported on 06/28/2017 05/20/15   Imogene Burn, PA-C  mometasone (ELOCON) 0.1 % cream Apply 1 application topically 3 (three) times daily.    [provider]  sodium bicarbonate 325 MG tablet Take 325 mg by mouth 2 (two) times daily.    [provider]     Family History  Problem Relation Age of Onset  . Psoriasis Brother  Social History   Social History  . Marital status: Divorced    Spouse name: N/A  . Number of children: N/A  . Years of education: N/A   Social History Main Topics  . Smoking status: Never Smoker  . Smokeless tobacco: Never Used  . Alcohol use No  . Drug use: No  . Sexual activity: No   Other Topics Concern  . Not on file   Social History Narrative  . No narrative on file       Review of Systems: A 12 point ROS discussed and pertinent positives are indicated in the HPI above.  All other systems are negative.  Review of Systems  Vital Signs: BP (!) 179/65   Pulse (!) 59   Temp 98.1 F (36.7 C) (Oral)   Resp 16   Ht 5' 4"  (1.626 m)   Wt 160 lb (72.6 kg)   LMP 08/29/1990   SpO2 100%   BMI 27.46 kg/m   Physical Exam Targeted physical exam shows healed left foot ulcer with small scar.  She has palpable right foot pulses.  She has doppler signal of the left PT and DP. No significant swelling.  No new wounds.  Imaging: No results found.  Labs:  CBC: No results for input(s): WBC, HGB, HCT, PLT in the last 8760 hours.  COAGS: No results for input(s): INR, APTT in the last 8760 hours.  BMP: No results for  input(s): NA, K, CL, CO2, GLUCOSE, BUN, CALCIUM, CREATININE, GFRNONAA, GFRAA in the last 8760 hours.  Invalid input(s): CMP  LIVER FUNCTION TESTS: No results for input(s): BILITOT, AST, ALT, ALKPHOS, PROT, ALBUMIN in the last 8760 hours.  TUMOR MARKERS: No results for input(s): AFPTM, CEA, CA199, CHROMGRNA in the last 8760 hours.  Assessment and Plan:  Ms Teti is a 83 year old female SP left SFA treatment for left foot wound, now healed, with the atherectomy and drug-balloon plasty performed over 2 years ago.    She has had no new problems, and we are safe to continue surveillance.  She is receiving excellent foot care with her current podiatrist, and she is receiving typical maximal medical care for PAD.    The only medication that may be considered adding would be anti-lipid medication, if not otherwise contra-indicated, per AHA guidelines.    I discussed our plan for surveillance, and she and her daughter agree.   Plan: - 1 year follow up with ABI/segmental at [place] office. - Continue maximal medical care - Continue her current excellent diabetic foot care    Electronically Signed: Corrie Mckusick 06/28/2017, 5:07 PM   I spent a total of    25 Minutes in face to face in clinical consultation, greater than 50% of which was counseling/coordinating care for PAD/CLI, status post left SFA atherectomy and drug-balloon plasty.

## 2017-07-12 DIAGNOSIS — Z23 Encounter for immunization: Secondary | ICD-10-CM | POA: Diagnosis not present

## 2017-07-12 DIAGNOSIS — L03818 Cellulitis of other sites: Secondary | ICD-10-CM | POA: Diagnosis not present

## 2017-07-18 ENCOUNTER — Encounter: Payer: Self-pay | Admitting: Cardiovascular Disease

## 2017-07-18 ENCOUNTER — Ambulatory Visit (INDEPENDENT_AMBULATORY_CARE_PROVIDER_SITE_OTHER): Payer: Medicare Other | Admitting: Cardiovascular Disease

## 2017-07-18 VITALS — BP 122/52 | HR 70 | Ht 64.0 in | Wt 142.0 lb

## 2017-07-18 DIAGNOSIS — I25118 Atherosclerotic heart disease of native coronary artery with other forms of angina pectoris: Secondary | ICD-10-CM | POA: Diagnosis not present

## 2017-07-18 DIAGNOSIS — I10 Essential (primary) hypertension: Secondary | ICD-10-CM

## 2017-07-18 DIAGNOSIS — I5032 Chronic diastolic (congestive) heart failure: Secondary | ICD-10-CM | POA: Diagnosis not present

## 2017-07-18 DIAGNOSIS — I209 Angina pectoris, unspecified: Secondary | ICD-10-CM

## 2017-07-18 DIAGNOSIS — I739 Peripheral vascular disease, unspecified: Secondary | ICD-10-CM | POA: Diagnosis not present

## 2017-07-18 DIAGNOSIS — I779 Disorder of arteries and arterioles, unspecified: Secondary | ICD-10-CM

## 2017-07-18 DIAGNOSIS — G4733 Obstructive sleep apnea (adult) (pediatric): Secondary | ICD-10-CM

## 2017-07-18 DIAGNOSIS — E7800 Pure hypercholesterolemia, unspecified: Secondary | ICD-10-CM

## 2017-07-18 NOTE — Progress Notes (Signed)
SUBJECTIVE: The patient presents for routine follow-up for chronic diastolic heart failure, malignant hypertension, and coronary artery disease. She underwent directional atherectomy and percutaneous intervention for critical left SFA stenosis in June 2016. Echocardiogram 01/29/16: Normal left ventricular systolic function, LVEF 40-34%, mild mitral regurgitation.  She is here with her daughter.  The patient denies chest pain, leg swelling, and shortness of breath.  Her daughter said that she had some cough and congestion in October which lasted about 2 weeks she was treated symptomatically with Mucinex.  It ultimately resolved on its own.  She is primarily wheelchair-bound and transverse to the toilet.  Blood pressure has been running in the 120/60 range.  Her daughter keeps track of this.  It was once elevated at 179/65 on October 31.  She has a remote history of carotid artery disease and has not had recent Dopplers.   Review of Systems: As per "subjective", otherwise negative.  Allergies  Allergen Reactions  . Ivp Dye [Iodinated Diagnostic Agents] Anaphylaxis  . Rocephin [Ceftriaxone] Diarrhea and Nausea And Vomiting    Current Outpatient Medications  Medication Sig Dispense Refill  . acetaminophen (TYLENOL) 325 MG tablet Take 650 mg by mouth every 6 (six) hours as needed.    Marland Kitchen amLODipine (NORVASC) 5 MG tablet Take 5 mg by mouth daily.    Marland Kitchen ammonium lactate (AMLACTIN) 12 % cream Apply topically 2 (two) times daily as needed for dry skin.    Marland Kitchen aspirin 325 MG tablet Take 325 mg by mouth daily.    Marland Kitchen atorvastatin (LIPITOR) 20 MG tablet Take 1 tablet (20 mg total) by mouth at bedtime. 30 tablet 6  . carbidopa-levodopa (SINEMET IR) 25-100 MG tablet 1 tablet at 8 am, 1 tablet at 12 pm, 1 tablet at 5 pm 90 tablet 5  . cholecalciferol (VITAMIN D) 1000 units tablet Take 1,000 Units by mouth daily.    . clobetasol (TEMOVATE) 0.05 % external solution Apply 1 application topically 2 (two)  times daily.    . coal tar (NEUTROGENA T-GEL) 0.5 % shampoo Apply topically at bedtime as needed. Applty topically Tues, Thur & Sat on evening shift.    Marland Kitchen escitalopram (LEXAPRO) 10 MG tablet Take 1 tablet (10 mg total) by mouth daily. 90 tablet 1  . folic acid (FOLVITE) 1 MG tablet TAKE 1 TABLET BY MOUTH DAILY. 30 tablet 3  . furosemide (LASIX) 40 MG tablet Take 40 mg by mouth daily.    Marland Kitchen gabapentin (NEURONTIN) 100 MG capsule Take 2 capsules (200 mg total) by mouth 2 (two) times daily. 120 capsule 0  . hydrALAZINE (APRESOLINE) 50 MG tablet TAKE 1 TABLET BY MOUTH 2 TIMES DAILY. 60 tablet 0  . Ketoconazole-Hydrocortisone 2 & 1 % KIT Apply 1 application topically daily.    . metFORMIN (GLUCOPHAGE) 500 MG tablet Take 500 mg by mouth 2 (two) times daily with a meal.    . mometasone (ELOCON) 0.1 % cream Apply 1 application topically 3 (three) times daily.    . Multiple Vitamins-Minerals (MULTIVITAMINS THER. W/MINERALS) TABS tablet Take 1 tablet by mouth daily.    Marland Kitchen omeprazole (PRILOSEC) 20 MG capsule Take 20 mg by mouth daily.    . sodium bicarbonate 325 MG tablet Take 325 mg by mouth 2 (two) times daily.    Marland Kitchen spironolactone (ALDACTONE) 25 MG tablet TAKE 1 TABLET BY MOUTH DAILY. 30 tablet 0  . triamcinolone cream (KENALOG) 0.1 % Apply 1 application topically 2 (two) times daily.  No current facility-administered medications for this visit.     Past Medical History:  Diagnosis Date  . Acute renal insufficiency    09/2011. not on ACEI secondary to this (also has renal artery stenosis)  . Arthritis   . CAD (coronary artery disease)    NSTEMI 09/2011 felt poor candidate for CABG, instead s/p PTCA/DES to mid RCA 10/17/11  . Carotid artery occlusion   . Cellulitis   . CHF (congestive heart failure) (HCC)    Diastolic. EF 55-65% by cath 10/11/11  . Chronic back pain   . Chronic knee pain   . Chronic respiratory failure ([place])   . CKD (chronic kidney disease) stage 2, GFR 60-89 ml/min 07/11/2013  .  Diabetes mellitus    insulin dependent  . GERD (gastroesophageal reflux disease)   . Hyperlipidemia   . Hypertension   . Mitral regurgitation   . Morbid obesity ([place])   . Nephrolithiasis   . Obstructive sleep apnea   . Peripheral vascular disease ([place])    severe right renal artery stenosis and left SFA stenosis by PV angio 09/2011, treated medically  . Psoriasis   . Pulmonary hypertension ([place])   . Radiculopathy    lumbar  . Sleep apnea    uses cpap  . Vitamin D deficiency     Past Surgical History:  Procedure Laterality Date  . ABDOMINAL HYSTERECTOMY    . ACROMIO-CLAVICULAR JOINT REPAIR  2011  . EYE SURGERY     cataract  OD  . IR GENERIC HISTORICAL  05/24/2016   IR RADIOLOGIST EVAL & MGMT 05/24/2016 Corrie Mckusick, DO [facility] INTERV RAD  . LEFT AND RIGHT HEART CATHETERIZATION WITH CORONARY ANGIOGRAM N/A 10/11/2011   Procedure: LEFT AND RIGHT HEART CATHETERIZATION WITH CORONARY ANGIOGRAM;  Surgeon: Sherren Mocha, MD;  Location: [facility] CATH LAB;  Service: Cardiovascular;  Laterality: N/A;  . LOWER EXTREMITY ANGIOGRAM N/A 10/11/2011   Procedure: LOWER EXTREMITY ANGIOGRAM;  Surgeon: Sherren Mocha, MD;  Location: [facility] CATH LAB;  Service: Cardiovascular;  Laterality: N/A;  . PERCUTANEOUS CORONARY STENT INTERVENTION (PCI-S) N/A 10/17/2011   Procedure: PERCUTANEOUS CORONARY STENT INTERVENTION (PCI-S);  Surgeon: Burnell Blanks, MD;  Location: [facility] CATH LAB;  Service: Cardiovascular;  Laterality: N/A;    Social History   Socioeconomic History  . Marital status: Divorced    Spouse name: Not on file  . Number of children: Not on file  . Years of education: Not on file  . Highest education level: Not on file  Social Needs  . Financial resource strain: Not on file  . Food insecurity - worry: Not on file  . Food insecurity - inability: Not on file  . Transportation needs - medical: Not on file  . Transportation needs - non-medical: Not on file  Occupational History  . Not on file    Tobacco Use  . Smoking status: Never Smoker  . Smokeless tobacco: Never Used  Substance and Sexual Activity  . Alcohol use: No    Alcohol/week: 0.0 oz  . Drug use: No  . Sexual activity: No  Other Topics Concern  . Not on file  Social History Narrative  . Not on file     Vitals:   07/18/17 1359  BP: (!) 122/52  Pulse: 70  SpO2: 96%  Weight: 142 lb (64.4 kg)  Height: _0  (1.626 m)    Wt Readings from Last 3 Encounters:  07/18/17 142 lb (64.4 kg)  06/28/17 160 lb (72.6 kg)  08/01/16 145 lb (65.8 kg)  PHYSICAL EXAM General: NAD, elderly, chronically ill-appearing. HEENT: Normal. Neck: No JVD, no thyromegaly. Lungs: Clear to auscultation bilaterally with normal respiratory effort. CV: Regular rate and rhythm, normal S1/S2, no S3/S4, soft I/VI systolic murmur along left sternal border. No pretibial or periankle edema.  No carotid bruit.   Abdomen: Soft, nontender, no distention.  Neurologic: Alert and oriented.  Psych: Normal affect. Skin: Normal. Musculoskeletal: No gross deformities.    ECG: Most recent ECG reviewed.   Labs: Lab Results  Component Value Date/Time   K 4.2 01/30/2016 05:26 AM   BUN 31 (H) 01/30/2016 05:26 AM   CREATININE 1.25 (H) 01/30/2016 05:26 AM   CREATININE 1.37 (H) 01/13/2015 01:25 PM   ALT 8 (L) 01/28/2016 03:14 PM   TSH 3.435 08/28/2014 10:08 PM   HGB 7.9 (L) 01/30/2016 05:26 AM     Lipids: No results found for: LDLCALC, LDLDIRECT, CHOL, TRIG, HDL     ASSESSMENT AND PLAN:  1. Chronic diastolic heart failure: Compensated and euvolemic. Takes Lasix 40 mg daily. Continue hydralazine and spironolactone.  Blood pressure is controlled.  2. Malignant hypertension:  Blood pressure is controlled. She is not on an ACEI reportedly because of severe right renal artery stenosis. Continue amlodipine 5 mg , hydralazine 50 mg two times a day, and spironolactone 25 mg.   3. CAD: Symptomatically stable. Resting nuclear images in 06/2014  did not show ischemia, and showed thinning in the basal anterolateral wall and inferolateral wall.  Has h/o PCI to RCA in 2013, with cath demonstrating 2-vessel disease in the RCA and with severe left circumflex disease (serial 90-95% mid LCx lesions by cath on 10-11-2011). Repeat nuclear myocardial perfusion study stress was normal in November of 2014. Continue ASA and atorvastatin. Not on beta blocker due to bradycardia.  4. OSA: On CPAP.  5. PVD: Severe right renal artery stenosis and underwent directional atherectomy and percutaneous intervention for left SFA critical stenosis in 01/2015.   6. Hyperlipidemia: On Lipitor 20 mg daily.   7.  Bilateral carotid artery disease: I will arrange for carotid Dopplers.  She remains on aspirin and statin therapy.   Disposition: Follow up 6 months   Kate Sable, M.D., F.A.C.C.

## 2017-07-18 NOTE — Patient Instructions (Signed)
Your physician wants you to follow-up in:  6 months with Dr.Koneswaran You will receive a reminder letter in the mail two months in advance. If you don't receive a letter, please call our office to schedule the follow-up appointment.     Your physician has requested that you have a carotid duplex. This test is an ultrasound of the carotid arteries in your neck. It looks at blood flow through these arteries that supply the brain with blood. Allow one hour for this exam. There are no restrictions or special instructions.     No change in medications.    No lab work or tests ordered today.       Thank you for choosing [facility] !

## 2017-07-19 ENCOUNTER — Encounter: Payer: Self-pay | Admitting: Interventional Radiology

## 2017-07-31 ENCOUNTER — Ambulatory Visit (HOSPITAL_COMMUNITY)
Admission: RE | Admit: 2017-07-31 | Discharge: 2017-07-31 | Disposition: A | Discharge status: Home / Self Care | Payer: Medicare Other | Source: Ambulatory Visit | Attending: Cardiovascular Disease | Admitting: Cardiovascular Disease

## 2017-07-31 DIAGNOSIS — I739 Peripheral vascular disease, unspecified: Secondary | ICD-10-CM

## 2017-07-31 DIAGNOSIS — I6529 Occlusion and stenosis of unspecified carotid artery: Secondary | ICD-10-CM | POA: Diagnosis not present

## 2017-07-31 DIAGNOSIS — I779 Disorder of arteries and arterioles, unspecified: Secondary | ICD-10-CM | POA: Diagnosis not present

## 2017-07-31 DIAGNOSIS — L400 Psoriasis vulgaris: Secondary | ICD-10-CM | POA: Diagnosis not present

## 2017-07-31 DIAGNOSIS — L853 Xerosis cutis: Secondary | ICD-10-CM | POA: Diagnosis not present

## 2017-07-31 DIAGNOSIS — L218 Other seborrheic dermatitis: Secondary | ICD-10-CM | POA: Diagnosis not present

## 2017-08-11 DIAGNOSIS — G20 Parkinson's disease: Secondary | ICD-10-CM | POA: Diagnosis not present

## 2017-08-11 DIAGNOSIS — I10 Essential (primary) hypertension: Secondary | ICD-10-CM | POA: Diagnosis not present

## 2017-08-11 DIAGNOSIS — E119 Type 2 diabetes mellitus without complications: Secondary | ICD-10-CM | POA: Diagnosis not present

## 2017-08-11 DIAGNOSIS — K219 Gastro-esophageal reflux disease without esophagitis: Secondary | ICD-10-CM | POA: Diagnosis not present

## 2017-08-14 DIAGNOSIS — L84 Corns and callosities: Secondary | ICD-10-CM | POA: Diagnosis not present

## 2017-08-14 DIAGNOSIS — B351 Tinea unguium: Secondary | ICD-10-CM | POA: Diagnosis not present

## 2017-08-14 DIAGNOSIS — M79676 Pain in unspecified toe(s): Secondary | ICD-10-CM | POA: Diagnosis not present

## 2017-08-14 DIAGNOSIS — E1142 Type 2 diabetes mellitus with diabetic polyneuropathy: Secondary | ICD-10-CM | POA: Diagnosis not present

## 2017-08-17 DIAGNOSIS — Z961 Presence of intraocular lens: Secondary | ICD-10-CM | POA: Diagnosis not present

## 2017-08-17 DIAGNOSIS — H2512 Age-related nuclear cataract, left eye: Secondary | ICD-10-CM | POA: Diagnosis not present

## 2017-08-17 DIAGNOSIS — E1139 Type 2 diabetes mellitus with other diabetic ophthalmic complication: Secondary | ICD-10-CM | POA: Diagnosis not present

## 2017-09-03 DIAGNOSIS — Z23 Encounter for immunization: Secondary | ICD-10-CM | POA: Diagnosis not present

## 2017-09-03 DIAGNOSIS — L03818 Cellulitis of other sites: Secondary | ICD-10-CM | POA: Diagnosis not present

## 2017-09-05 DIAGNOSIS — K5901 Slow transit constipation: Secondary | ICD-10-CM | POA: Diagnosis not present

## 2017-09-06 DIAGNOSIS — I10 Essential (primary) hypertension: Secondary | ICD-10-CM | POA: Diagnosis not present

## 2017-09-06 DIAGNOSIS — D649 Anemia, unspecified: Secondary | ICD-10-CM | POA: Diagnosis not present

## 2017-10-17 DIAGNOSIS — K219 Gastro-esophageal reflux disease without esophagitis: Secondary | ICD-10-CM | POA: Diagnosis not present

## 2017-10-17 DIAGNOSIS — G20 Parkinson's disease: Secondary | ICD-10-CM | POA: Diagnosis not present

## 2017-10-17 DIAGNOSIS — I10 Essential (primary) hypertension: Secondary | ICD-10-CM | POA: Diagnosis not present

## 2017-10-17 DIAGNOSIS — E119 Type 2 diabetes mellitus without complications: Secondary | ICD-10-CM | POA: Diagnosis not present

## 2017-10-25 DIAGNOSIS — E785 Hyperlipidemia, unspecified: Secondary | ICD-10-CM | POA: Diagnosis not present

## 2017-10-25 DIAGNOSIS — E119 Type 2 diabetes mellitus without complications: Secondary | ICD-10-CM | POA: Diagnosis not present

## 2017-10-25 DIAGNOSIS — I10 Essential (primary) hypertension: Secondary | ICD-10-CM | POA: Diagnosis not present

## 2017-10-25 DIAGNOSIS — G20 Parkinson's disease: Secondary | ICD-10-CM | POA: Diagnosis not present

## 2017-10-27 DIAGNOSIS — I10 Essential (primary) hypertension: Secondary | ICD-10-CM | POA: Diagnosis not present

## 2017-10-30 DIAGNOSIS — F329 Major depressive disorder, single episode, unspecified: Secondary | ICD-10-CM | POA: Diagnosis not present

## 2017-11-06 DIAGNOSIS — L84 Corns and callosities: Secondary | ICD-10-CM | POA: Diagnosis not present

## 2017-11-06 DIAGNOSIS — E1142 Type 2 diabetes mellitus with diabetic polyneuropathy: Secondary | ICD-10-CM | POA: Diagnosis not present

## 2017-11-06 DIAGNOSIS — B351 Tinea unguium: Secondary | ICD-10-CM | POA: Diagnosis not present

## 2017-11-06 DIAGNOSIS — M79676 Pain in unspecified toe(s): Secondary | ICD-10-CM | POA: Diagnosis not present

## 2017-11-22 DIAGNOSIS — E1139 Type 2 diabetes mellitus with other diabetic ophthalmic complication: Secondary | ICD-10-CM | POA: Diagnosis not present

## 2017-11-22 DIAGNOSIS — H2512 Age-related nuclear cataract, left eye: Secondary | ICD-10-CM | POA: Diagnosis not present

## 2017-11-22 DIAGNOSIS — Z961 Presence of intraocular lens: Secondary | ICD-10-CM | POA: Diagnosis not present

## 2017-11-29 DIAGNOSIS — E559 Vitamin D deficiency, unspecified: Secondary | ICD-10-CM | POA: Diagnosis not present

## 2017-11-29 DIAGNOSIS — I10 Essential (primary) hypertension: Secondary | ICD-10-CM | POA: Diagnosis not present

## 2017-11-29 DIAGNOSIS — D649 Anemia, unspecified: Secondary | ICD-10-CM | POA: Diagnosis not present

## 2017-12-04 DIAGNOSIS — L853 Xerosis cutis: Secondary | ICD-10-CM | POA: Diagnosis not present

## 2017-12-04 DIAGNOSIS — L218 Other seborrheic dermatitis: Secondary | ICD-10-CM | POA: Diagnosis not present

## 2017-12-04 DIAGNOSIS — L400 Psoriasis vulgaris: Secondary | ICD-10-CM | POA: Diagnosis not present

## 2017-12-22 DIAGNOSIS — E119 Type 2 diabetes mellitus without complications: Secondary | ICD-10-CM | POA: Diagnosis not present

## 2017-12-22 DIAGNOSIS — G20 Parkinson's disease: Secondary | ICD-10-CM | POA: Diagnosis not present

## 2017-12-22 DIAGNOSIS — M6281 Muscle weakness (generalized): Secondary | ICD-10-CM | POA: Diagnosis not present

## 2017-12-22 DIAGNOSIS — I10 Essential (primary) hypertension: Secondary | ICD-10-CM | POA: Diagnosis not present

## 2018-01-23 DIAGNOSIS — R1110 Vomiting, unspecified: Secondary | ICD-10-CM | POA: Diagnosis not present

## 2018-02-08 DIAGNOSIS — R42 Dizziness and giddiness: Secondary | ICD-10-CM | POA: Diagnosis not present

## 2018-02-08 DIAGNOSIS — I10 Essential (primary) hypertension: Secondary | ICD-10-CM | POA: Diagnosis not present

## 2018-02-08 DIAGNOSIS — G20 Parkinson's disease: Secondary | ICD-10-CM | POA: Diagnosis not present

## 2018-02-08 DIAGNOSIS — D631 Anemia in chronic kidney disease: Secondary | ICD-10-CM | POA: Diagnosis not present

## 2018-02-09 DIAGNOSIS — R9431 Abnormal electrocardiogram [ECG] [EKG]: Secondary | ICD-10-CM | POA: Diagnosis not present

## 2018-02-09 DIAGNOSIS — M6281 Muscle weakness (generalized): Secondary | ICD-10-CM | POA: Diagnosis not present

## 2018-02-09 DIAGNOSIS — E039 Hypothyroidism, unspecified: Secondary | ICD-10-CM | POA: Diagnosis not present

## 2018-02-09 DIAGNOSIS — D649 Anemia, unspecified: Secondary | ICD-10-CM | POA: Diagnosis not present

## 2018-02-09 DIAGNOSIS — Z79899 Other long term (current) drug therapy: Secondary | ICD-10-CM | POA: Diagnosis not present

## 2018-02-09 DIAGNOSIS — G20 Parkinson's disease: Secondary | ICD-10-CM | POA: Diagnosis not present

## 2018-02-09 DIAGNOSIS — R42 Dizziness and giddiness: Secondary | ICD-10-CM | POA: Diagnosis not present

## 2018-02-15 ENCOUNTER — Ambulatory Visit: Payer: Medicare Other | Admitting: Cardiovascular Disease

## 2018-02-22 ENCOUNTER — Encounter

## 2018-02-22 ENCOUNTER — Ambulatory Visit (INDEPENDENT_AMBULATORY_CARE_PROVIDER_SITE_OTHER): Payer: Medicare Other | Admitting: Cardiovascular Disease

## 2018-02-22 ENCOUNTER — Encounter: Payer: Self-pay | Admitting: Cardiovascular Disease

## 2018-02-22 VITALS — BP 130/64 | HR 59 | Ht 69.0 in | Wt 152.0 lb

## 2018-02-22 DIAGNOSIS — R42 Dizziness and giddiness: Secondary | ICD-10-CM | POA: Diagnosis not present

## 2018-02-22 DIAGNOSIS — I10 Essential (primary) hypertension: Secondary | ICD-10-CM | POA: Diagnosis not present

## 2018-02-22 DIAGNOSIS — E785 Hyperlipidemia, unspecified: Secondary | ICD-10-CM

## 2018-02-22 DIAGNOSIS — R001 Bradycardia, unspecified: Secondary | ICD-10-CM

## 2018-02-22 DIAGNOSIS — G4733 Obstructive sleep apnea (adult) (pediatric): Secondary | ICD-10-CM | POA: Diagnosis not present

## 2018-02-22 DIAGNOSIS — I25118 Atherosclerotic heart disease of native coronary artery with other forms of angina pectoris: Secondary | ICD-10-CM | POA: Diagnosis not present

## 2018-02-22 DIAGNOSIS — I739 Peripheral vascular disease, unspecified: Secondary | ICD-10-CM

## 2018-02-22 DIAGNOSIS — I779 Disorder of arteries and arterioles, unspecified: Secondary | ICD-10-CM

## 2018-02-22 DIAGNOSIS — I5032 Chronic diastolic (congestive) heart failure: Secondary | ICD-10-CM | POA: Diagnosis not present

## 2018-02-22 MED ORDER — NITROGLYCERIN 0.4 MG SL SUBL: 0.4000 mg | SUBLINGUAL_TABLET | SUBLINGUAL | 3 refills | Status: AC | PRN

## 2018-02-22 NOTE — Progress Notes (Signed)
SUBJECTIVE: The patient presents for follow-up of chronic diastolic heart failure, accelerated hypertension, and coronary artery disease. She underwent directional atherectomy and percutaneous intervention for critical left SFA stenosis in June 2016. Echocardiogram 01/29/16: Normal left ventricular systolic function, LVEF 78-29%, mild mitral regurgitation.  She is primarily wheelchair-bound.  I reviewed carotid Dopplers performed on 07/31/2017 which showed progression of large amount of bilateral atherosclerotic plaque, left greater than right, though not definitely resulting in a hemodynamically significant stenosis within either internal carotid artery.  She is here with her daughter.  She apparently had an episode of chest pain and dizziness at the residential facility.  They did an ECG which I personally reviewed and demonstrated what appears to be a junctional rhythm, heart rate 52 bpm, with ST segment and T wave abnormalities diffusely.  I compared this to an ECG performed on 07/18/2017 and other than some minor T wave changes, there are no significant differences.  I looked back at her ECGs 2013 and that ST segment T wave abnormalities have persisted since then.  She currently denies chest pain, palpitations, shortness of breath, and leg swelling.  She has no recent labs in our system.   Review of Systems: As per "subjective", otherwise negative.  Allergies  Allergen Reactions  . Ivp Dye [Iodinated Diagnostic Agents] Anaphylaxis  . Rocephin [Ceftriaxone] Diarrhea and Nausea And Vomiting    Current Outpatient Medications  Medication Sig Dispense Refill  . acetaminophen (TYLENOL) 325 MG tablet Take 650 mg by mouth every 6 (six) hours as needed.    Marland Kitchen amLODipine (NORVASC) 5 MG tablet Take 5 mg by mouth daily.    Marland Kitchen ammonium lactate (AMLACTIN) 12 % cream Apply topically 2 (two) times daily as needed for dry skin.    Marland Kitchen aspirin 325 MG tablet Take 325 mg by mouth daily.    Marland Kitchen  atorvastatin (LIPITOR) 20 MG tablet Take 1 tablet (20 mg total) by mouth at bedtime. 30 tablet 6  . carbidopa-levodopa (SINEMET IR) 25-100 MG tablet 1 tablet at 8 am, 1 tablet at 12 pm, 1 tablet at 5 pm 90 tablet 5  . cholecalciferol (VITAMIN D) 1000 units tablet Take 1,000 Units by mouth daily.    . clobetasol (TEMOVATE) 0.05 % external solution Apply 1 application topically 2 (two) times daily.    . coal tar (NEUTROGENA T-GEL) 0.5 % shampoo Apply topically at bedtime as needed. Applty topically Tues, Thur & Sat on evening shift.    Marland Kitchen escitalopram (LEXAPRO) 10 MG tablet Take 1 tablet (10 mg total) by mouth daily. 90 tablet 1  . folic acid (FOLVITE) 1 MG tablet TAKE 1 TABLET BY MOUTH DAILY. 30 tablet 3  . furosemide (LASIX) 40 MG tablet Take 40 mg by mouth daily.    Marland Kitchen gabapentin (NEURONTIN) 100 MG capsule Take 2 capsules (200 mg total) by mouth 2 (two) times daily. 120 capsule 0  . hydrALAZINE (APRESOLINE) 50 MG tablet TAKE 1 TABLET BY MOUTH 2 TIMES DAILY. 60 tablet 0  . Ketoconazole-Hydrocortisone 2 & 1 % KIT Apply 1 application topically daily.    . metFORMIN (GLUCOPHAGE) 500 MG tablet Take 500 mg by mouth 2 (two) times daily with a meal.    . mometasone (ELOCON) 0.1 % cream Apply 1 application topically 3 (three) times daily.    . Multiple Vitamins-Minerals (MULTIVITAMINS THER. W/MINERALS) TABS tablet Take 1 tablet by mouth daily.    Marland Kitchen omeprazole (PRILOSEC) 20 MG capsule Take 20 mg by mouth daily.    Marland Kitchen  sodium bicarbonate 325 MG tablet Take 325 mg by mouth 2 (two) times daily.    Marland Kitchen spironolactone (ALDACTONE) 25 MG tablet TAKE 1 TABLET BY MOUTH DAILY. 30 tablet 0  . triamcinolone cream (KENALOG) 0.1 % Apply 1 application topically 2 (two) times daily.     No current facility-administered medications for this visit.     Past Medical History:  Diagnosis Date  . Acute renal insufficiency    09/2011. not on ACEI secondary to this (also has renal artery stenosis)  . Arthritis   . CAD (coronary  artery disease)    NSTEMI 09/2011 felt poor candidate for CABG, instead s/p PTCA/DES to mid RCA 10/17/11  . Carotid artery occlusion   . Cellulitis   . CHF (congestive heart failure) (HCC)    Diastolic. EF 55-65% by cath 10/11/11  . Chronic back pain   . Chronic knee pain   . Chronic respiratory failure ([place])   . CKD (chronic kidney disease) stage 2, GFR 60-89 ml/min 07/11/2013  . Diabetes mellitus    insulin dependent  . GERD (gastroesophageal reflux disease)   . Hyperlipidemia   . Hypertension   . Mitral regurgitation   . Morbid obesity ([place])   . Nephrolithiasis   . Obstructive sleep apnea   . Peripheral vascular disease ([place])    severe right renal artery stenosis and left SFA stenosis by PV angio 09/2011, treated medically  . Psoriasis   . Pulmonary hypertension ([place])   . Radiculopathy    lumbar  . Sleep apnea    uses cpap  . Vitamin D deficiency     Past Surgical History:  Procedure Laterality Date  . ABDOMINAL HYSTERECTOMY    . ACROMIO-CLAVICULAR JOINT REPAIR  2011  . EYE SURGERY     cataract  OD  . IR GENERIC HISTORICAL  05/24/2016   IR RADIOLOGIST EVAL & MGMT 05/24/2016 Corrie Mckusick, DO [facility] INTERV RAD  . IR RADIOLOGIST EVAL & MGMT  06/28/2017  . LEFT AND RIGHT HEART CATHETERIZATION WITH CORONARY ANGIOGRAM N/A 10/11/2011   Procedure: LEFT AND RIGHT HEART CATHETERIZATION WITH CORONARY ANGIOGRAM;  Surgeon: Sherren Mocha, MD;  Location: [facility] CATH LAB;  Service: Cardiovascular;  Laterality: N/A;  . LOWER EXTREMITY ANGIOGRAM N/A 10/11/2011   Procedure: LOWER EXTREMITY ANGIOGRAM;  Surgeon: Sherren Mocha, MD;  Location: [facility] CATH LAB;  Service: Cardiovascular;  Laterality: N/A;  . PERCUTANEOUS CORONARY STENT INTERVENTION (PCI-S) N/A 10/17/2011   Procedure: PERCUTANEOUS CORONARY STENT INTERVENTION (PCI-S);  Surgeon: Burnell Blanks, MD;  Location: [facility] CATH LAB;  Service: Cardiovascular;  Laterality: N/A;    Social History   Socioeconomic History  . Marital status:  Divorced    Spouse name: Not on file  . Number of children: Not on file  . Years of education: Not on file  . Highest education level: Not on file  Occupational History  . Not on file  Social Needs  . Financial resource strain: Not on file  . Food insecurity:    Worry: Not on file    Inability: Not on file  . Transportation needs:    Medical: Not on file    Non-medical: Not on file  Tobacco Use  . Smoking status: Never Smoker  . Smokeless tobacco: Never Used  Substance and Sexual Activity  . Alcohol use: No    Alcohol/week: 0.0 oz  . Drug use: No  . Sexual activity: Never  Lifestyle  . Physical activity:    Days per week: Not on file  Minutes per session: Not on file  . Stress: Not on file  Relationships  . Social connections:    Talks on phone: Not on file    Gets together: Not on file    Attends religious service: Not on file    Active member of club or organization: Not on file    Attends meetings of clubs or organizations: Not on file    Relationship status: Not on file  . Intimate partner violence:    Fear of current or ex partner: Not on file    Emotionally abused: Not on file    Physically abused: Not on file    Forced sexual activity: Not on file  Other Topics Concern  . Not on file  Social History Narrative  . Not on file     Vitals:   02/22/18 1132  BP: 130/64  Pulse: (!) 59  SpO2: 99%  Weight: 152 lb (68.9 kg)  Height: _0  (1.753 m)    Wt Readings from Last 3 Encounters:  02/22/18 152 lb (68.9 kg)  07/18/17 142 lb (64.4 kg)  06/28/17 160 lb (72.6 kg)     PHYSICAL EXAM General: NAD, elderly, chronically ill appearing HEENT: Normal. Neck: No JVD, no thyromegaly. Lungs: Clear to auscultation bilaterally with normal respiratory effort. CV: Rated cardia, regular rhythm, normal S1/S2, no S3/S4, soft I/VI systolic murmur along left sternal border. No pretibial or periankle edema.    Abdomen: Soft, nontender, no distention.  Neurologic:  Alert and oriented.  Psych: Normal affect. Skin: Normal. Musculoskeletal: No gross deformities.    ECG: Reviewed above under Subjective   Labs: Lab Results  Component Value Date/Time   K 4.2 01/30/2016 05:26 AM   BUN 31 (H) 01/30/2016 05:26 AM   CREATININE 1.25 (H) 01/30/2016 05:26 AM   CREATININE 1.37 (H) 01/13/2015 01:25 PM   ALT 8 (L) 01/28/2016 03:14 PM   TSH 3.435 08/28/2014 10:08 PM   HGB 7.9 (L) 01/30/2016 05:26 AM     Lipids: No results found for: LDLCALC, LDLDIRECT, CHOL, TRIG, HDL     ASSESSMENT AND PLAN: 1. Chronic diastolic heart failure: Compensated and euvolemic. Takes Lasix40 mg daily. Continue hydralazine and spironolactone.  Blood pressure is controlled.  2.  Accelerated hypertension: Blood pressure is controlled. She is not on an ACEI reportedly because of severe right renal artery stenosis. Continue amlodipine80m , hydralazine 50 mg two times a day, and spironolactone 25 mg.   3.  Coronary artery disease with angina: Symptoms are currently stable.  I will prescribe nitroglycerin.  I had a long talk with her daughter about further cardiac testing.  I said that if we were unable to control symptoms with medications and proceeded with stress testing, coronary angiography would be the next step.  I asked her if she and her family would want this and she said she would have to speak with her brother who lives out of town and is the power of attorney. Resting nuclear images in 06/2014 did not show ischemia, and showed thinning in the basal anterolateral wall and inferolateral wall.  Has h/o PCI to RCA in 2013, with cath demonstrating 2-vessel disease in the RCA and with severe left circumflex disease (serial 90-95% mid LCx lesions by cath on 10-11-2011). Repeat nuclear myocardial perfusion study stress was normal in November of 2014. Continue ASA (I will reduce to 81 mg) and atorvastatin. Not on beta blocker due to bradycardia. I will also obtain a copy of labs  from the physician  who treats her at the facility where she resides.  4. OSA: On CPAP.  5. PVD: Severe right renal artery stenosis and underwent directional atherectomy and percutaneous intervention for left SFA critical stenosis in 01/2015. She is on aspirin and statin.  I will reduce aspirin 81 mg.  6. Hyperlipidemia: On Lipitor 20 mg daily.   7.  Bilateral carotid artery disease: Carotid Dopplers reviewed above.  Continue aspirin and statin.  8.  Bradycardia and dizziness: Heart rate currently in the 50 bpm range.  No indication for pacemaker at this point.  I will monitor.   Disposition: Follow up 10 weeks   Kate Sable, M.D., F.A.C.C.

## 2018-02-22 NOTE — Patient Instructions (Addendum)
Your physician wants you to follow-up in:  10 weeks with Dr.Koneswaran   Take nitroglycerin as directed    DECREASE Aspirin to 81 mg daily    If you need a refill on your cardiac medications before your next appointment, please call your pharmacy.     No lab work or tests ordered today     Thank you for choosing [facility] !

## 2018-02-27 DIAGNOSIS — F329 Major depressive disorder, single episode, unspecified: Secondary | ICD-10-CM | POA: Diagnosis not present

## 2018-03-12 DIAGNOSIS — L853 Xerosis cutis: Secondary | ICD-10-CM | POA: Diagnosis not present

## 2018-03-12 DIAGNOSIS — L400 Psoriasis vulgaris: Secondary | ICD-10-CM | POA: Diagnosis not present

## 2018-03-12 DIAGNOSIS — L300 Nummular dermatitis: Secondary | ICD-10-CM | POA: Diagnosis not present

## 2018-03-12 DIAGNOSIS — L218 Other seborrheic dermatitis: Secondary | ICD-10-CM | POA: Diagnosis not present

## 2018-03-19 DIAGNOSIS — N390 Urinary tract infection, site not specified: Secondary | ICD-10-CM | POA: Diagnosis not present

## 2018-03-19 DIAGNOSIS — R319 Hematuria, unspecified: Secondary | ICD-10-CM | POA: Diagnosis not present

## 2018-03-30 DIAGNOSIS — F329 Major depressive disorder, single episode, unspecified: Secondary | ICD-10-CM | POA: Diagnosis not present

## 2018-03-30 DIAGNOSIS — Z9119 Patient's noncompliance with other medical treatment and regimen: Secondary | ICD-10-CM | POA: Diagnosis not present

## 2018-03-30 DIAGNOSIS — G20 Parkinson's disease: Secondary | ICD-10-CM | POA: Diagnosis not present

## 2018-04-03 DIAGNOSIS — G20 Parkinson's disease: Secondary | ICD-10-CM | POA: Diagnosis not present

## 2018-04-03 DIAGNOSIS — I10 Essential (primary) hypertension: Secondary | ICD-10-CM | POA: Diagnosis not present

## 2018-04-03 DIAGNOSIS — E119 Type 2 diabetes mellitus without complications: Secondary | ICD-10-CM | POA: Diagnosis not present

## 2018-04-03 DIAGNOSIS — M6281 Muscle weakness (generalized): Secondary | ICD-10-CM | POA: Diagnosis not present

## 2018-04-07 DIAGNOSIS — G20 Parkinson's disease: Secondary | ICD-10-CM | POA: Diagnosis not present

## 2018-04-07 DIAGNOSIS — I10 Essential (primary) hypertension: Secondary | ICD-10-CM | POA: Diagnosis not present

## 2018-04-07 DIAGNOSIS — K219 Gastro-esophageal reflux disease without esophagitis: Secondary | ICD-10-CM | POA: Diagnosis not present

## 2018-04-07 DIAGNOSIS — D631 Anemia in chronic kidney disease: Secondary | ICD-10-CM | POA: Diagnosis not present

## 2018-04-09 DIAGNOSIS — L84 Corns and callosities: Secondary | ICD-10-CM | POA: Diagnosis not present

## 2018-04-09 DIAGNOSIS — M79676 Pain in unspecified toe(s): Secondary | ICD-10-CM | POA: Diagnosis not present

## 2018-04-09 DIAGNOSIS — E1151 Type 2 diabetes mellitus with diabetic peripheral angiopathy without gangrene: Secondary | ICD-10-CM | POA: Diagnosis not present

## 2018-04-09 DIAGNOSIS — B351 Tinea unguium: Secondary | ICD-10-CM | POA: Diagnosis not present

## 2018-04-29 DIAGNOSIS — R918 Other nonspecific abnormal finding of lung field: Secondary | ICD-10-CM | POA: Diagnosis not present

## 2018-04-29 DIAGNOSIS — R05 Cough: Secondary | ICD-10-CM | POA: Diagnosis not present

## 2018-04-29 DIAGNOSIS — J159 Unspecified bacterial pneumonia: Secondary | ICD-10-CM | POA: Diagnosis not present

## 2018-05-17 ENCOUNTER — Other Ambulatory Visit: Payer: Self-pay | Admitting: Interventional Radiology

## 2018-05-17 DIAGNOSIS — I739 Peripheral vascular disease, unspecified: Secondary | ICD-10-CM

## 2018-05-21 ENCOUNTER — Encounter: Payer: Self-pay | Admitting: Cardiovascular Disease

## 2018-05-21 ENCOUNTER — Other Ambulatory Visit (HOSPITAL_COMMUNITY)
Admission: RE | Admit: 2018-05-21 | Discharge: 2018-05-21 | Disposition: A | Discharge status: Home / Self Care | Payer: Medicare Other | Source: Ambulatory Visit | Attending: Cardiovascular Disease | Admitting: Cardiovascular Disease

## 2018-05-21 ENCOUNTER — Ambulatory Visit (HOSPITAL_COMMUNITY)
Admission: RE | Admit: 2018-05-21 | Discharge: 2018-05-21 | Disposition: A | Discharge status: Home / Self Care | Payer: Medicare Other | Source: Ambulatory Visit | Attending: Cardiovascular Disease | Admitting: Cardiovascular Disease

## 2018-05-21 ENCOUNTER — Ambulatory Visit (INDEPENDENT_AMBULATORY_CARE_PROVIDER_SITE_OTHER): Payer: Medicare Other | Admitting: Cardiovascular Disease

## 2018-05-21 VITALS — BP 130/62 | HR 83 | Ht 67.0 in | Wt 170.0 lb

## 2018-05-21 DIAGNOSIS — I10 Essential (primary) hypertension: Secondary | ICD-10-CM

## 2018-05-21 DIAGNOSIS — R001 Bradycardia, unspecified: Secondary | ICD-10-CM | POA: Diagnosis not present

## 2018-05-21 DIAGNOSIS — I779 Disorder of arteries and arterioles, unspecified: Secondary | ICD-10-CM

## 2018-05-21 DIAGNOSIS — R059 Cough, unspecified: Secondary | ICD-10-CM

## 2018-05-21 DIAGNOSIS — I700 Atherosclerosis of aorta: Secondary | ICD-10-CM | POA: Insufficient documentation

## 2018-05-21 DIAGNOSIS — E785 Hyperlipidemia, unspecified: Secondary | ICD-10-CM | POA: Diagnosis not present

## 2018-05-21 DIAGNOSIS — R05 Cough: Secondary | ICD-10-CM

## 2018-05-21 DIAGNOSIS — G4733 Obstructive sleep apnea (adult) (pediatric): Secondary | ICD-10-CM | POA: Diagnosis not present

## 2018-05-21 DIAGNOSIS — I509 Heart failure, unspecified: Secondary | ICD-10-CM | POA: Diagnosis not present

## 2018-05-21 DIAGNOSIS — R0602 Shortness of breath: Secondary | ICD-10-CM

## 2018-05-21 DIAGNOSIS — I5032 Chronic diastolic (congestive) heart failure: Secondary | ICD-10-CM | POA: Diagnosis not present

## 2018-05-21 DIAGNOSIS — R42 Dizziness and giddiness: Secondary | ICD-10-CM | POA: Diagnosis not present

## 2018-05-21 DIAGNOSIS — I739 Peripheral vascular disease, unspecified: Secondary | ICD-10-CM

## 2018-05-21 DIAGNOSIS — I25118 Atherosclerotic heart disease of native coronary artery with other forms of angina pectoris: Secondary | ICD-10-CM | POA: Diagnosis not present

## 2018-05-21 LAB — CBC
HCT: 40.3 % (ref 36.0–46.0)
HEMOGLOBIN: 12.3 g/dL (ref 12.0–15.0)
MCH: 25.4 pg — ABNORMAL LOW (ref 26.0–34.0)
MCHC: 30.5 g/dL (ref 30.0–36.0)
MCV: 83.1 fL (ref 78.0–100.0)
PLATELETS: 244 10*3/uL (ref 150–400)
RBC: 4.85 MIL/uL (ref 3.87–5.11)
RDW: 16.6 % — ABNORMAL HIGH (ref 11.5–15.5)
WBC: 8.9 10*3/uL (ref 4.0–10.5)

## 2018-05-21 LAB — COMPREHENSIVE METABOLIC PANEL
ALT: 7 U/L (ref 0–44)
ANION GAP: 12 (ref 5–15)
AST: 15 U/L (ref 15–41)
Albumin: 4.4 g/dL (ref 3.5–5.0)
Alkaline Phosphatase: 65 U/L (ref 38–126)
BILIRUBIN TOTAL: 0.6 mg/dL (ref 0.3–1.2)
BUN: 30 mg/dL — AB (ref 8–23)
CO2: 24 mmol/L (ref 22–32)
Calcium: 10.6 mg/dL — ABNORMAL HIGH (ref 8.9–10.3)
Chloride: 102 mmol/L (ref 98–111)
Creatinine, Ser: 1.09 mg/dL — ABNORMAL HIGH (ref 0.44–1.00)
GFR calc Af Amer: 51 mL/min — ABNORMAL LOW (ref >60)
GFR, EST NON AFRICAN AMERICAN: 44 mL/min — AB (ref >60)
Glucose, Bld: 178 mg/dL — ABNORMAL HIGH (ref 70–99)
POTASSIUM: 4.3 mmol/L (ref 3.5–5.1)
Sodium: 138 mmol/L (ref 135–145)
Total Protein: 8.7 g/dL — ABNORMAL HIGH (ref 6.5–8.1)

## 2018-05-21 LAB — BRAIN NATRIURETIC PEPTIDE: B NATRIURETIC PEPTIDE 5: 132 pg/mL — AB (ref 0.0–100.0)

## 2018-05-21 NOTE — Progress Notes (Signed)
SUBJECTIVE: The patient presents for follow-up of chronic diastolic heart failure, accelerated hypertension, and coronary artery disease. She underwent directional atherectomy and percutaneous intervention for critical left SFA stenosis in June 2016. Echocardiogram 01/29/16: Normal left ventricular systolic function, LVEF 05-69%, mild mitral regurgitation.  She is primarily wheelchair-bound.  I reviewed carotid Dopplers performed on 07/31/2017 which showed progression of large amount of bilateral atherosclerotic plaque, left greater than right, though not definitely resulting in a hemodynamically significant stenosis within either internal carotid artery.  She is here with her daughter who is her medical power of attorney.  She has had a persistent cough since Labor Day.  She apparently had a low-grade fever earlier this month but denies any recent fevers.  She has had some mild shortness of breath but denies leg swelling, orthopnea, and paroxysmal nocturnal dyspnea.  It appears she may have had a chest x-ray at the residential facility.  They called and told the patient's daughter that she had pneumonia initially but then called back and said she did not.  She has been prescribed albuterol nebulizer treatments and Tessalon Perles.  She has not had any recent blood work as per her daughter.       Review of Systems: As per "subjective", otherwise negative.  Allergies  Allergen Reactions  . Ivp Dye [Iodinated Diagnostic Agents] Anaphylaxis  . Rocephin [Ceftriaxone] Diarrhea and Nausea And Vomiting    Current Outpatient Medications  Medication Sig Dispense Refill  . acetaminophen (TYLENOL) 325 MG tablet Take 650 mg by mouth every 6 (six) hours as needed.    Marland Kitchen amLODipine (NORVASC) 5 MG tablet Take 5 mg by mouth daily.    Marland Kitchen ammonium lactate (AMLACTIN) 12 % cream Apply topically 2 (two) times daily as needed for dry skin.    Marland Kitchen aspirin 81 MG chewable tablet Chew 81 mg by mouth  daily.    Marland Kitchen atorvastatin (LIPITOR) 20 MG tablet Take 1 tablet (20 mg total) by mouth at bedtime. 30 tablet 6  . carbidopa-levodopa (SINEMET IR) 25-100 MG tablet 1 tablet at 8 am, 1 tablet at 12 pm, 1 tablet at 5 pm 90 tablet 5  . cholecalciferol (VITAMIN D) 1000 units tablet Take 1,000 Units by mouth daily.    . clobetasol (TEMOVATE) 0.05 % external solution Apply 1 application topically 2 (two) times daily.    . coal tar (NEUTROGENA T-GEL) 0.5 % shampoo Apply topically at bedtime as needed. Applty topically Tues, Thur & Sat on evening shift.    Marland Kitchen escitalopram (LEXAPRO) 10 MG tablet Take 1 tablet (10 mg total) by mouth daily. 90 tablet 1  . folic acid (FOLVITE) 1 MG tablet TAKE 1 TABLET BY MOUTH DAILY. 30 tablet 3  . furosemide (LASIX) 40 MG tablet Take 40 mg by mouth daily.    Marland Kitchen gabapentin (NEURONTIN) 100 MG capsule Take 2 capsules (200 mg total) by mouth 2 (two) times daily. 120 capsule 0  . hydrALAZINE (APRESOLINE) 50 MG tablet TAKE 1 TABLET BY MOUTH 2 TIMES DAILY. 60 tablet 0  . Ketoconazole-Hydrocortisone 2 & 1 % KIT Apply 1 application topically daily.    . metFORMIN (GLUCOPHAGE) 500 MG tablet Take 500 mg by mouth 2 (two) times daily with a meal.    . mometasone (ELOCON) 0.1 % cream Apply 1 application topically 3 (three) times daily.    . Multiple Vitamins-Minerals (MULTIVITAMINS THER. W/MINERALS) TABS tablet Take 1 tablet by mouth daily.    . nitroGLYCERIN (NITROSTAT) 0.4 MG SL tablet  Place 1 tablet (0.4 mg total) under the tongue every 5 (five) minutes as needed. 25 tablet 3  . omeprazole (PRILOSEC) 20 MG capsule Take 20 mg by mouth daily.    . sodium bicarbonate 325 MG tablet Take 325 mg by mouth 2 (two) times daily.    Marland Kitchen spironolactone (ALDACTONE) 25 MG tablet TAKE 1 TABLET BY MOUTH DAILY. 30 tablet 0  . triamcinolone cream (KENALOG) 0.1 % Apply 1 application topically 2 (two) times daily.     No current facility-administered medications for this visit.     Past Medical History:    Diagnosis Date  . Acute renal insufficiency    09/2011. not on ACEI secondary to this (also has renal artery stenosis)  . Arthritis   . CAD (coronary artery disease)    NSTEMI 09/2011 felt poor candidate for CABG, instead s/p PTCA/DES to mid RCA 10/17/11  . Carotid artery occlusion   . Cellulitis   . CHF (congestive heart failure) (HCC)    Diastolic. EF 55-65% by cath 10/11/11  . Chronic back pain   . Chronic knee pain   . Chronic respiratory failure ([place])   . CKD (chronic kidney disease) stage 2, GFR 60-89 ml/min 07/11/2013  . Diabetes mellitus    insulin dependent  . GERD (gastroesophageal reflux disease)   . Hyperlipidemia   . Hypertension   . Mitral regurgitation   . Morbid obesity ([place])   . Nephrolithiasis   . Obstructive sleep apnea   . Peripheral vascular disease ([place])    severe right renal artery stenosis and left SFA stenosis by PV angio 09/2011, treated medically  . Psoriasis   . Pulmonary hypertension ([place])   . Radiculopathy    lumbar  . Sleep apnea    uses cpap  . Vitamin D deficiency     Past Surgical History:  Procedure Laterality Date  . ABDOMINAL HYSTERECTOMY    . ACROMIO-CLAVICULAR JOINT REPAIR  2011  . EYE SURGERY     cataract  OD  . IR GENERIC HISTORICAL  05/24/2016   IR RADIOLOGIST EVAL & MGMT 05/24/2016 Corrie Mckusick, DO [facility] INTERV RAD  . IR RADIOLOGIST EVAL & MGMT  06/28/2017  . LEFT AND RIGHT HEART CATHETERIZATION WITH CORONARY ANGIOGRAM N/A 10/11/2011   Procedure: LEFT AND RIGHT HEART CATHETERIZATION WITH CORONARY ANGIOGRAM;  Surgeon: Sherren Mocha, MD;  Location: [facility] CATH LAB;  Service: Cardiovascular;  Laterality: N/A;  . LOWER EXTREMITY ANGIOGRAM N/A 10/11/2011   Procedure: LOWER EXTREMITY ANGIOGRAM;  Surgeon: Sherren Mocha, MD;  Location: [facility] CATH LAB;  Service: Cardiovascular;  Laterality: N/A;  . PERCUTANEOUS CORONARY STENT INTERVENTION (PCI-S) N/A 10/17/2011   Procedure: PERCUTANEOUS CORONARY STENT INTERVENTION (PCI-S);  Surgeon: Burnell Blanks, MD;  Location: [facility] CATH LAB;  Service: Cardiovascular;  Laterality: N/A;    Social History   Socioeconomic History  . Marital status: Divorced    Spouse name: Not on file  . Number of children: Not on file  . Years of education: Not on file  . Highest education level: Not on file  Occupational History  . Not on file  Social Needs  . Financial resource strain: Not on file  . Food insecurity:    Worry: Not on file    Inability: Not on file  . Transportation needs:    Medical: Not on file    Non-medical: Not on file  Tobacco Use  . Smoking status: Never Smoker  . Smokeless tobacco: Never Used  Substance and Sexual Activity  .  Alcohol use: No    Alcohol/week: 0.0 standard drinks  . Drug use: No  . Sexual activity: Never  Lifestyle  . Physical activity:    Days per week: Not on file    Minutes per session: Not on file  . Stress: Not on file  Relationships  . Social connections:    Talks on phone: Not on file    Gets together: Not on file    Attends religious service: Not on file    Active member of club or organization: Not on file    Attends meetings of clubs or organizations: Not on file    Relationship status: Not on file  . Intimate partner violence:    Fear of current or ex partner: Not on file    Emotionally abused: Not on file    Physically abused: Not on file    Forced sexual activity: Not on file  Other Topics Concern  . Not on file  Social History Narrative  . Not on file     Vitals:   05/21/18 1402  BP: 130/62  Pulse: 83  SpO2: 97%  Weight: 170 lb (77.1 kg)  Height: _0  (1.702 m)    Wt Readings from Last 3 Encounters:  05/21/18 170 lb (77.1 kg)  02/22/18 152 lb (68.9 kg)  07/18/17 142 lb (64.4 kg)     PHYSICAL EXAM General: NAD, elderly, chronically ill-appearing HEENT: Normal. Neck: No JVD, no thyromegaly. Lungs: Clear to auscultation bilaterally with normal respiratory effort. CV: Regular rate and rhythm, normal S1/S2, no  S3/S4, soft I/VI systolic murmur along left sternal border. No pretibial or periankle edema.    Abdomen: Soft, nontender, no distention.  Neurologic: Alert and oriented.  Psych: Normal affect. Skin: Normal. Musculoskeletal: No gross deformities.    ECG: Reviewed above under Subjective   Labs: Lab Results  Component Value Date/Time   K 4.2 01/30/2016 05:26 AM   BUN 31 (H) 01/30/2016 05:26 AM   CREATININE 1.25 (H) 01/30/2016 05:26 AM   CREATININE 1.37 (H) 01/13/2015 01:25 PM   ALT 8 (L) 01/28/2016 03:14 PM   TSH 3.435 08/28/2014 10:08 PM   HGB 7.9 (L) 01/30/2016 05:26 AM     Lipids: No results found for: LDLCALC, LDLDIRECT, CHOL, TRIG, HDL     ASSESSMENT AND PLAN: 1. Chronic diastolic heart failure: Compensated and euvolemic. Takes Lasix40 mg daily. Continue hydralazine and spironolactone.Blood pressure is controlled.  2.  Accelerated hypertension:Blood pressure is controlled. She is not on an ACEI reportedly because of severe right renal artery stenosis. Continue amlodipine81m , hydralazine 50 mg two times a day, and spironolactone 25 mg.   3.  Coronary artery disease with angina: Symptoms are currently stable. I previously had a long talk with her daughter about further cardiac testing.  I said that if we were unable to control symptoms with medications and proceeded with stress testing, coronary angiography would be the next step.  I asked her if she and her family would want this and she said she would have to speak with her brother who lives out of town and is the power of attorney. Resting nuclear images in 06/2014 did not show ischemia, and showed thinning in the basal anterolateral wall and inferolateral wall.  Has h/o PCI to RCA in 2013, with cath demonstrating 2-vessel disease in the RCA and with severe left circumflex disease (serial 90-95% mid LCx lesions by cath on 10-11-2011). Repeat nuclear myocardial perfusion study stress was normal in November of 2014.  Continue ASA  81 mg and atorvastatin. Not on beta blocker due to bradycardia.  4. MBT:DHRCBU.  5. PVD: Severe right renal artery stenosis and underwent directional atherectomy and percutaneous intervention for left SFA critical stenosis in 01/2015. She is on aspirin and statin. .  6. Hyperlipidemia: On Lipitor 20 mg daily.  7. Bilateral carotid artery disease: Carotid Dopplers reviewed above.  Continue aspirin and statin.  8.  Bradycardia and dizziness: Heart rate currently in the 80 bpm range.  No indication for pacemaker at this point.  I will monitor.  9.  Persistent cough with some shortness of breath: I will obtain a chest x-ray.  I will also obtain a CBC, BNP, and comprehensive metabolic panel.  While she does not appear volume overloaded, I want to make certain that there are no subtle findings on chest x-ray.   Disposition: Follow up 4 months   Kate Sable, M.D., F.A.C.C.

## 2018-05-21 NOTE — Patient Instructions (Addendum)
Your physician recommends that you schedule a follow-up appointment in: 4 months with [place]     Get lab work today: cbc,bnp, cmp   Get chest x-ray today    Your physician recommends that you continue on your current medications as directed. Please refer to the Current Medication list given to you today.    If you need a refill on your cardiac medications before your next appointment, please call your pharmacy.       Thank you for choosing [facility] !

## 2018-05-22 ENCOUNTER — Telehealth: Payer: Self-pay

## 2018-05-22 MED ORDER — POTASSIUM CHLORIDE CRYS ER 20 MEQ PO TBCR: 20.0000 meq | EXTENDED_RELEASE_TABLET | Freq: Every day | ORAL | 3 refills | Status: DC

## 2018-05-22 NOTE — Telephone Encounter (Signed)
-----   Message from Herminio Commons, MD sent at 05/22/2018  9:42 AM EDT ----- Very mild fluid accumulation in lungs.  This may be responsible for cough and shortness of breath.  Increase Lasix to 40 mg twice daily for the next 3 days and start potassium 20 mEq daily.  Repeat a basic metabolic panel in 3 days.

## 2018-05-22 NOTE — Telephone Encounter (Signed)
I spoke with Theresa Mclean at [facility] and gave her MD orders, will fax this note , cxr results and labs to them    Fax [phone]

## 2018-05-24 DIAGNOSIS — I509 Heart failure, unspecified: Secondary | ICD-10-CM | POA: Diagnosis not present

## 2018-05-24 DIAGNOSIS — R05 Cough: Secondary | ICD-10-CM | POA: Diagnosis not present

## 2018-05-25 DIAGNOSIS — D649 Anemia, unspecified: Secondary | ICD-10-CM | POA: Diagnosis not present

## 2018-05-25 DIAGNOSIS — Z79899 Other long term (current) drug therapy: Secondary | ICD-10-CM | POA: Diagnosis not present

## 2018-06-01 DIAGNOSIS — G20 Parkinson's disease: Secondary | ICD-10-CM | POA: Diagnosis not present

## 2018-06-01 DIAGNOSIS — E119 Type 2 diabetes mellitus without complications: Secondary | ICD-10-CM | POA: Diagnosis not present

## 2018-06-01 DIAGNOSIS — K219 Gastro-esophageal reflux disease without esophagitis: Secondary | ICD-10-CM | POA: Diagnosis not present

## 2018-06-01 DIAGNOSIS — I10 Essential (primary) hypertension: Secondary | ICD-10-CM | POA: Diagnosis not present

## 2018-06-05 ENCOUNTER — Encounter: Payer: Self-pay | Admitting: Radiology

## 2018-06-18 DIAGNOSIS — M79676 Pain in unspecified toe(s): Secondary | ICD-10-CM | POA: Diagnosis not present

## 2018-06-18 DIAGNOSIS — B351 Tinea unguium: Secondary | ICD-10-CM | POA: Diagnosis not present

## 2018-06-18 DIAGNOSIS — E1142 Type 2 diabetes mellitus with diabetic polyneuropathy: Secondary | ICD-10-CM | POA: Diagnosis not present

## 2018-06-18 DIAGNOSIS — F329 Major depressive disorder, single episode, unspecified: Secondary | ICD-10-CM | POA: Diagnosis not present

## 2018-06-18 DIAGNOSIS — L84 Corns and callosities: Secondary | ICD-10-CM | POA: Diagnosis not present

## 2018-06-19 DIAGNOSIS — E1139 Type 2 diabetes mellitus with other diabetic ophthalmic complication: Secondary | ICD-10-CM | POA: Diagnosis not present

## 2018-06-19 DIAGNOSIS — H2512 Age-related nuclear cataract, left eye: Secondary | ICD-10-CM | POA: Diagnosis not present

## 2018-06-19 DIAGNOSIS — Z961 Presence of intraocular lens: Secondary | ICD-10-CM | POA: Diagnosis not present

## 2018-06-21 DIAGNOSIS — B351 Tinea unguium: Secondary | ICD-10-CM | POA: Diagnosis not present

## 2018-06-21 DIAGNOSIS — G20 Parkinson's disease: Secondary | ICD-10-CM | POA: Diagnosis not present

## 2018-06-21 DIAGNOSIS — E1159 Type 2 diabetes mellitus with other circulatory complications: Secondary | ICD-10-CM | POA: Diagnosis not present

## 2018-06-21 DIAGNOSIS — I10 Essential (primary) hypertension: Secondary | ICD-10-CM | POA: Diagnosis not present

## 2018-06-22 DIAGNOSIS — E119 Type 2 diabetes mellitus without complications: Secondary | ICD-10-CM | POA: Diagnosis not present

## 2018-06-22 DIAGNOSIS — Z79899 Other long term (current) drug therapy: Secondary | ICD-10-CM | POA: Diagnosis not present

## 2018-06-25 DIAGNOSIS — E119 Type 2 diabetes mellitus without complications: Secondary | ICD-10-CM | POA: Diagnosis not present

## 2018-06-25 DIAGNOSIS — Z794 Long term (current) use of insulin: Secondary | ICD-10-CM | POA: Diagnosis not present

## 2018-07-04 DIAGNOSIS — L03818 Cellulitis of other sites: Secondary | ICD-10-CM | POA: Diagnosis not present

## 2018-07-04 DIAGNOSIS — Z23 Encounter for immunization: Secondary | ICD-10-CM | POA: Diagnosis not present

## 2018-07-16 DIAGNOSIS — L400 Psoriasis vulgaris: Secondary | ICD-10-CM | POA: Diagnosis not present

## 2018-07-16 DIAGNOSIS — L853 Xerosis cutis: Secondary | ICD-10-CM | POA: Diagnosis not present

## 2018-07-16 DIAGNOSIS — L300 Nummular dermatitis: Secondary | ICD-10-CM | POA: Diagnosis not present

## 2018-07-16 DIAGNOSIS — L218 Other seborrheic dermatitis: Secondary | ICD-10-CM | POA: Diagnosis not present

## 2018-07-17 DIAGNOSIS — L853 Xerosis cutis: Secondary | ICD-10-CM | POA: Diagnosis not present

## 2018-07-17 DIAGNOSIS — L219 Seborrheic dermatitis, unspecified: Secondary | ICD-10-CM | POA: Diagnosis not present

## 2018-07-17 DIAGNOSIS — L409 Psoriasis, unspecified: Secondary | ICD-10-CM | POA: Diagnosis not present

## 2018-08-08 DIAGNOSIS — G20 Parkinson's disease: Secondary | ICD-10-CM | POA: Diagnosis not present

## 2018-08-08 DIAGNOSIS — E119 Type 2 diabetes mellitus without complications: Secondary | ICD-10-CM | POA: Diagnosis not present

## 2018-08-08 DIAGNOSIS — M6281 Muscle weakness (generalized): Secondary | ICD-10-CM | POA: Diagnosis not present

## 2018-08-08 DIAGNOSIS — I10 Essential (primary) hypertension: Secondary | ICD-10-CM | POA: Diagnosis not present

## 2018-08-13 DIAGNOSIS — M792 Neuralgia and neuritis, unspecified: Secondary | ICD-10-CM | POA: Diagnosis not present

## 2018-09-21 DIAGNOSIS — H2512 Age-related nuclear cataract, left eye: Secondary | ICD-10-CM | POA: Diagnosis not present

## 2018-09-21 DIAGNOSIS — E1139 Type 2 diabetes mellitus with other diabetic ophthalmic complication: Secondary | ICD-10-CM | POA: Diagnosis not present

## 2018-09-21 DIAGNOSIS — Z961 Presence of intraocular lens: Secondary | ICD-10-CM | POA: Diagnosis not present

## 2018-09-23 DIAGNOSIS — L308 Other specified dermatitis: Secondary | ICD-10-CM | POA: Diagnosis not present

## 2018-09-23 DIAGNOSIS — R21 Rash and other nonspecific skin eruption: Secondary | ICD-10-CM | POA: Diagnosis not present

## 2018-09-24 DIAGNOSIS — M79676 Pain in unspecified toe(s): Secondary | ICD-10-CM | POA: Diagnosis not present

## 2018-09-24 DIAGNOSIS — E1142 Type 2 diabetes mellitus with diabetic polyneuropathy: Secondary | ICD-10-CM | POA: Diagnosis not present

## 2018-09-24 DIAGNOSIS — B351 Tinea unguium: Secondary | ICD-10-CM | POA: Diagnosis not present

## 2018-09-24 DIAGNOSIS — L84 Corns and callosities: Secondary | ICD-10-CM | POA: Diagnosis not present

## 2018-09-26 DIAGNOSIS — F329 Major depressive disorder, single episode, unspecified: Secondary | ICD-10-CM | POA: Diagnosis not present

## 2018-10-03 DIAGNOSIS — E119 Type 2 diabetes mellitus without complications: Secondary | ICD-10-CM | POA: Diagnosis not present

## 2018-10-03 DIAGNOSIS — R490 Dysphonia: Secondary | ICD-10-CM | POA: Diagnosis not present

## 2018-10-03 DIAGNOSIS — G20 Parkinson's disease: Secondary | ICD-10-CM | POA: Diagnosis not present

## 2018-10-03 DIAGNOSIS — R05 Cough: Secondary | ICD-10-CM | POA: Diagnosis not present

## 2018-10-05 DIAGNOSIS — R739 Hyperglycemia, unspecified: Secondary | ICD-10-CM | POA: Diagnosis not present

## 2018-10-25 DIAGNOSIS — L409 Psoriasis, unspecified: Secondary | ICD-10-CM | POA: Diagnosis not present

## 2018-11-05 DIAGNOSIS — R21 Rash and other nonspecific skin eruption: Secondary | ICD-10-CM | POA: Diagnosis not present

## 2018-11-05 DIAGNOSIS — L409 Psoriasis, unspecified: Secondary | ICD-10-CM | POA: Diagnosis not present

## 2018-11-13 DIAGNOSIS — E119 Type 2 diabetes mellitus without complications: Secondary | ICD-10-CM | POA: Diagnosis not present

## 2018-11-13 DIAGNOSIS — L409 Psoriasis, unspecified: Secondary | ICD-10-CM | POA: Diagnosis not present

## 2018-11-13 DIAGNOSIS — M792 Neuralgia and neuritis, unspecified: Secondary | ICD-10-CM | POA: Diagnosis not present

## 2018-11-20 DIAGNOSIS — N390 Urinary tract infection, site not specified: Secondary | ICD-10-CM | POA: Diagnosis not present

## 2018-11-20 DIAGNOSIS — R319 Hematuria, unspecified: Secondary | ICD-10-CM | POA: Diagnosis not present

## 2018-11-21 DIAGNOSIS — R300 Dysuria: Secondary | ICD-10-CM | POA: Diagnosis not present

## 2018-11-21 DIAGNOSIS — R338 Other retention of urine: Secondary | ICD-10-CM | POA: Diagnosis not present

## 2018-11-27 DIAGNOSIS — K219 Gastro-esophageal reflux disease without esophagitis: Secondary | ICD-10-CM | POA: Diagnosis not present

## 2018-11-27 DIAGNOSIS — L219 Seborrheic dermatitis, unspecified: Secondary | ICD-10-CM | POA: Diagnosis not present

## 2018-11-27 DIAGNOSIS — G20 Parkinson's disease: Secondary | ICD-10-CM | POA: Diagnosis not present

## 2018-11-27 DIAGNOSIS — I10 Essential (primary) hypertension: Secondary | ICD-10-CM | POA: Diagnosis not present

## 2018-12-17 DIAGNOSIS — F3289 Other specified depressive episodes: Secondary | ICD-10-CM | POA: Diagnosis not present

## 2019-01-21 DIAGNOSIS — Z79899 Other long term (current) drug therapy: Secondary | ICD-10-CM | POA: Diagnosis not present

## 2019-01-21 DIAGNOSIS — N390 Urinary tract infection, site not specified: Secondary | ICD-10-CM | POA: Diagnosis not present

## 2019-01-21 DIAGNOSIS — R319 Hematuria, unspecified: Secondary | ICD-10-CM | POA: Diagnosis not present

## 2019-01-21 DIAGNOSIS — D649 Anemia, unspecified: Secondary | ICD-10-CM | POA: Diagnosis not present

## 2019-01-22 DIAGNOSIS — N390 Urinary tract infection, site not specified: Secondary | ICD-10-CM | POA: Diagnosis not present

## 2019-01-22 DIAGNOSIS — A499 Bacterial infection, unspecified: Secondary | ICD-10-CM | POA: Diagnosis not present

## 2019-01-24 DIAGNOSIS — R109 Unspecified abdominal pain: Secondary | ICD-10-CM | POA: Diagnosis not present

## 2019-01-24 DIAGNOSIS — T50905A Adverse effect of unspecified drugs, medicaments and biological substances, initial encounter: Secondary | ICD-10-CM | POA: Diagnosis not present

## 2019-01-24 DIAGNOSIS — N390 Urinary tract infection, site not specified: Secondary | ICD-10-CM | POA: Diagnosis not present

## 2019-01-24 DIAGNOSIS — R110 Nausea: Secondary | ICD-10-CM | POA: Diagnosis not present

## 2019-02-01 DIAGNOSIS — G5793 Unspecified mononeuropathy of bilateral lower limbs: Secondary | ICD-10-CM | POA: Diagnosis not present

## 2019-02-14 DIAGNOSIS — G20 Parkinson's disease: Secondary | ICD-10-CM | POA: Diagnosis not present

## 2019-02-14 DIAGNOSIS — E559 Vitamin D deficiency, unspecified: Secondary | ICD-10-CM | POA: Diagnosis not present

## 2019-02-14 DIAGNOSIS — E039 Hypothyroidism, unspecified: Secondary | ICD-10-CM | POA: Diagnosis not present

## 2019-02-14 DIAGNOSIS — E119 Type 2 diabetes mellitus without complications: Secondary | ICD-10-CM | POA: Diagnosis not present

## 2019-02-14 DIAGNOSIS — E785 Hyperlipidemia, unspecified: Secondary | ICD-10-CM | POA: Diagnosis not present

## 2019-02-14 DIAGNOSIS — I10 Essential (primary) hypertension: Secondary | ICD-10-CM | POA: Diagnosis not present

## 2019-02-18 DIAGNOSIS — Z79899 Other long term (current) drug therapy: Secondary | ICD-10-CM | POA: Diagnosis not present

## 2019-02-18 DIAGNOSIS — D649 Anemia, unspecified: Secondary | ICD-10-CM | POA: Diagnosis not present

## 2019-02-21 DIAGNOSIS — D649 Anemia, unspecified: Secondary | ICD-10-CM | POA: Diagnosis not present

## 2019-02-21 DIAGNOSIS — G20 Parkinson's disease: Secondary | ICD-10-CM | POA: Diagnosis not present

## 2019-02-21 DIAGNOSIS — E785 Hyperlipidemia, unspecified: Secondary | ICD-10-CM | POA: Diagnosis not present

## 2019-02-21 DIAGNOSIS — E119 Type 2 diabetes mellitus without complications: Secondary | ICD-10-CM | POA: Diagnosis not present

## 2019-02-25 DIAGNOSIS — R238 Other skin changes: Secondary | ICD-10-CM | POA: Diagnosis not present

## 2019-03-06 DIAGNOSIS — W19XXXA Unspecified fall, initial encounter: Secondary | ICD-10-CM | POA: Diagnosis not present

## 2019-03-06 DIAGNOSIS — M25562 Pain in left knee: Secondary | ICD-10-CM | POA: Diagnosis not present

## 2019-03-06 DIAGNOSIS — M25561 Pain in right knee: Secondary | ICD-10-CM | POA: Diagnosis not present

## 2019-03-15 DIAGNOSIS — E039 Hypothyroidism, unspecified: Secondary | ICD-10-CM | POA: Diagnosis not present

## 2019-03-15 DIAGNOSIS — G20 Parkinson's disease: Secondary | ICD-10-CM | POA: Diagnosis not present

## 2019-03-15 DIAGNOSIS — E119 Type 2 diabetes mellitus without complications: Secondary | ICD-10-CM | POA: Diagnosis not present

## 2019-03-15 DIAGNOSIS — I10 Essential (primary) hypertension: Secondary | ICD-10-CM | POA: Diagnosis not present

## 2019-03-15 DIAGNOSIS — E785 Hyperlipidemia, unspecified: Secondary | ICD-10-CM | POA: Diagnosis not present

## 2019-04-02 DIAGNOSIS — L853 Xerosis cutis: Secondary | ICD-10-CM | POA: Diagnosis not present

## 2019-04-02 DIAGNOSIS — G20 Parkinson's disease: Secondary | ICD-10-CM | POA: Diagnosis not present

## 2019-04-02 DIAGNOSIS — E119 Type 2 diabetes mellitus without complications: Secondary | ICD-10-CM | POA: Diagnosis not present

## 2019-04-02 DIAGNOSIS — I10 Essential (primary) hypertension: Secondary | ICD-10-CM | POA: Diagnosis not present

## 2019-04-08 DIAGNOSIS — Z79899 Other long term (current) drug therapy: Secondary | ICD-10-CM | POA: Diagnosis not present

## 2019-04-08 DIAGNOSIS — E785 Hyperlipidemia, unspecified: Secondary | ICD-10-CM | POA: Diagnosis not present

## 2019-04-26 DIAGNOSIS — L853 Xerosis cutis: Secondary | ICD-10-CM | POA: Diagnosis not present

## 2019-04-26 DIAGNOSIS — B49 Unspecified mycosis: Secondary | ICD-10-CM | POA: Diagnosis not present

## 2019-04-29 DIAGNOSIS — E039 Hypothyroidism, unspecified: Secondary | ICD-10-CM | POA: Diagnosis not present

## 2019-04-29 DIAGNOSIS — Z79899 Other long term (current) drug therapy: Secondary | ICD-10-CM | POA: Diagnosis not present

## 2019-05-02 DIAGNOSIS — L309 Dermatitis, unspecified: Secondary | ICD-10-CM | POA: Diagnosis not present

## 2019-05-02 DIAGNOSIS — B49 Unspecified mycosis: Secondary | ICD-10-CM | POA: Diagnosis not present

## 2019-05-04 DIAGNOSIS — R319 Hematuria, unspecified: Secondary | ICD-10-CM | POA: Diagnosis not present

## 2019-05-04 DIAGNOSIS — N390 Urinary tract infection, site not specified: Secondary | ICD-10-CM | POA: Diagnosis not present

## 2019-05-09 DIAGNOSIS — Z993 Dependence on wheelchair: Secondary | ICD-10-CM | POA: Diagnosis not present

## 2019-05-09 DIAGNOSIS — B351 Tinea unguium: Secondary | ICD-10-CM | POA: Diagnosis not present

## 2019-05-09 DIAGNOSIS — Z7982 Long term (current) use of aspirin: Secondary | ICD-10-CM | POA: Diagnosis not present

## 2019-05-09 DIAGNOSIS — E1159 Type 2 diabetes mellitus with other circulatory complications: Secondary | ICD-10-CM | POA: Diagnosis not present

## 2019-05-09 DIAGNOSIS — G20 Parkinson's disease: Secondary | ICD-10-CM | POA: Diagnosis not present

## 2019-05-17 DIAGNOSIS — Z20828 Contact with and (suspected) exposure to other viral communicable diseases: Secondary | ICD-10-CM | POA: Diagnosis not present

## 2019-05-24 DIAGNOSIS — Z20828 Contact with and (suspected) exposure to other viral communicable diseases: Secondary | ICD-10-CM | POA: Diagnosis not present

## 2019-05-27 DIAGNOSIS — F329 Major depressive disorder, single episode, unspecified: Secondary | ICD-10-CM | POA: Diagnosis not present

## 2019-05-31 DIAGNOSIS — L409 Psoriasis, unspecified: Secondary | ICD-10-CM | POA: Diagnosis not present

## 2019-05-31 DIAGNOSIS — Z20828 Contact with and (suspected) exposure to other viral communicable diseases: Secondary | ICD-10-CM | POA: Diagnosis not present

## 2019-05-31 DIAGNOSIS — L853 Xerosis cutis: Secondary | ICD-10-CM | POA: Diagnosis not present

## 2019-06-03 DIAGNOSIS — E039 Hypothyroidism, unspecified: Secondary | ICD-10-CM | POA: Diagnosis not present

## 2019-06-03 DIAGNOSIS — I10 Essential (primary) hypertension: Secondary | ICD-10-CM | POA: Diagnosis not present

## 2019-06-03 DIAGNOSIS — E119 Type 2 diabetes mellitus without complications: Secondary | ICD-10-CM | POA: Diagnosis not present

## 2019-06-03 DIAGNOSIS — I509 Heart failure, unspecified: Secondary | ICD-10-CM | POA: Diagnosis not present

## 2019-06-04 DIAGNOSIS — Z79899 Other long term (current) drug therapy: Secondary | ICD-10-CM | POA: Diagnosis not present

## 2019-06-07 DIAGNOSIS — Z20828 Contact with and (suspected) exposure to other viral communicable diseases: Secondary | ICD-10-CM | POA: Diagnosis not present

## 2019-06-11 DIAGNOSIS — L409 Psoriasis, unspecified: Secondary | ICD-10-CM | POA: Diagnosis not present

## 2019-06-11 DIAGNOSIS — L853 Xerosis cutis: Secondary | ICD-10-CM | POA: Diagnosis not present

## 2019-06-11 DIAGNOSIS — B372 Candidiasis of skin and nail: Secondary | ICD-10-CM | POA: Diagnosis not present

## 2019-06-25 DIAGNOSIS — R300 Dysuria: Secondary | ICD-10-CM | POA: Diagnosis not present

## 2019-06-25 DIAGNOSIS — R410 Disorientation, unspecified: Secondary | ICD-10-CM | POA: Diagnosis not present

## 2019-06-25 DIAGNOSIS — R102 Pelvic and perineal pain: Secondary | ICD-10-CM | POA: Diagnosis not present

## 2019-06-26 DIAGNOSIS — Z79899 Other long term (current) drug therapy: Secondary | ICD-10-CM | POA: Diagnosis not present

## 2019-06-26 DIAGNOSIS — E785 Hyperlipidemia, unspecified: Secondary | ICD-10-CM | POA: Diagnosis not present

## 2019-06-26 DIAGNOSIS — L03818 Cellulitis of other sites: Secondary | ICD-10-CM | POA: Diagnosis not present

## 2019-06-26 DIAGNOSIS — G20 Parkinson's disease: Secondary | ICD-10-CM | POA: Diagnosis not present

## 2019-06-26 DIAGNOSIS — R319 Hematuria, unspecified: Secondary | ICD-10-CM | POA: Diagnosis not present

## 2019-06-26 DIAGNOSIS — N390 Urinary tract infection, site not specified: Secondary | ICD-10-CM | POA: Diagnosis not present

## 2019-06-26 DIAGNOSIS — Z23 Encounter for immunization: Secondary | ICD-10-CM | POA: Diagnosis not present

## 2019-06-26 DIAGNOSIS — E119 Type 2 diabetes mellitus without complications: Secondary | ICD-10-CM | POA: Diagnosis not present

## 2019-07-01 DIAGNOSIS — F329 Major depressive disorder, single episode, unspecified: Secondary | ICD-10-CM | POA: Diagnosis not present

## 2019-07-10 DIAGNOSIS — J84114 Acute interstitial pneumonitis: Secondary | ICD-10-CM | POA: Diagnosis not present

## 2019-07-10 DIAGNOSIS — R05 Cough: Secondary | ICD-10-CM | POA: Diagnosis not present

## 2019-07-10 DIAGNOSIS — R0989 Other specified symptoms and signs involving the circulatory and respiratory systems: Secondary | ICD-10-CM | POA: Diagnosis not present

## 2019-07-10 DIAGNOSIS — I517 Cardiomegaly: Secondary | ICD-10-CM | POA: Diagnosis not present

## 2019-07-10 DIAGNOSIS — R410 Disorientation, unspecified: Secondary | ICD-10-CM | POA: Diagnosis not present

## 2019-07-10 DIAGNOSIS — M1990 Unspecified osteoarthritis, unspecified site: Secondary | ICD-10-CM | POA: Diagnosis not present

## 2019-07-10 DIAGNOSIS — R0602 Shortness of breath: Secondary | ICD-10-CM | POA: Diagnosis not present

## 2019-07-10 DIAGNOSIS — M8580 Other specified disorders of bone density and structure, unspecified site: Secondary | ICD-10-CM | POA: Diagnosis not present

## 2019-07-11 DIAGNOSIS — J849 Interstitial pulmonary disease, unspecified: Secondary | ICD-10-CM | POA: Diagnosis not present

## 2019-07-24 DIAGNOSIS — Z20828 Contact with and (suspected) exposure to other viral communicable diseases: Secondary | ICD-10-CM | POA: Diagnosis not present

## 2019-07-26 DIAGNOSIS — E870 Hyperosmolality and hypernatremia: Secondary | ICD-10-CM | POA: Diagnosis not present

## 2019-07-26 DIAGNOSIS — R471 Dysarthria and anarthria: Secondary | ICD-10-CM | POA: Diagnosis not present

## 2019-07-26 DIAGNOSIS — I639 Cerebral infarction, unspecified: Secondary | ICD-10-CM | POA: Diagnosis not present

## 2019-07-26 DIAGNOSIS — N189 Chronic kidney disease, unspecified: Secondary | ICD-10-CM | POA: Diagnosis not present

## 2019-07-26 DIAGNOSIS — Z20828 Contact with and (suspected) exposure to other viral communicable diseases: Secondary | ICD-10-CM | POA: Diagnosis present

## 2019-07-26 DIAGNOSIS — Z91041 Radiographic dye allergy status: Secondary | ICD-10-CM | POA: Diagnosis not present

## 2019-07-26 DIAGNOSIS — D631 Anemia in chronic kidney disease: Secondary | ICD-10-CM | POA: Diagnosis present

## 2019-07-26 DIAGNOSIS — Z993 Dependence on wheelchair: Secondary | ICD-10-CM | POA: Diagnosis not present

## 2019-07-26 DIAGNOSIS — Z808 Family history of malignant neoplasm of other organs or systems: Secondary | ICD-10-CM | POA: Diagnosis not present

## 2019-07-26 DIAGNOSIS — N390 Urinary tract infection, site not specified: Secondary | ICD-10-CM | POA: Diagnosis not present

## 2019-07-26 DIAGNOSIS — I130 Hypertensive heart and chronic kidney disease with heart failure and stage 1 through stage 4 chronic kidney disease, or unspecified chronic kidney disease: Secondary | ICD-10-CM | POA: Diagnosis not present

## 2019-07-26 DIAGNOSIS — G9341 Metabolic encephalopathy: Secondary | ICD-10-CM | POA: Diagnosis not present

## 2019-07-26 DIAGNOSIS — K219 Gastro-esophageal reflux disease without esophagitis: Secondary | ICD-10-CM | POA: Diagnosis present

## 2019-07-26 DIAGNOSIS — R4182 Altered mental status, unspecified: Secondary | ICD-10-CM | POA: Diagnosis not present

## 2019-07-26 DIAGNOSIS — N179 Acute kidney failure, unspecified: Secondary | ICD-10-CM | POA: Diagnosis not present

## 2019-07-26 DIAGNOSIS — Z955 Presence of coronary angioplasty implant and graft: Secondary | ICD-10-CM | POA: Diagnosis not present

## 2019-07-26 DIAGNOSIS — R2689 Other abnormalities of gait and mobility: Secondary | ICD-10-CM | POA: Diagnosis not present

## 2019-07-26 DIAGNOSIS — E1122 Type 2 diabetes mellitus with diabetic chronic kidney disease: Secondary | ICD-10-CM | POA: Diagnosis not present

## 2019-07-26 DIAGNOSIS — I2510 Atherosclerotic heart disease of native coronary artery without angina pectoris: Secondary | ICD-10-CM | POA: Diagnosis present

## 2019-07-26 DIAGNOSIS — Z7982 Long term (current) use of aspirin: Secondary | ICD-10-CM | POA: Diagnosis not present

## 2019-07-26 DIAGNOSIS — E878 Other disorders of electrolyte and fluid balance, not elsewhere classified: Secondary | ICD-10-CM | POA: Diagnosis not present

## 2019-07-26 DIAGNOSIS — Z833 Family history of diabetes mellitus: Secondary | ICD-10-CM | POA: Diagnosis not present

## 2019-07-26 DIAGNOSIS — L219 Seborrheic dermatitis, unspecified: Secondary | ICD-10-CM | POA: Diagnosis present

## 2019-07-26 DIAGNOSIS — R4789 Other speech disturbances: Secondary | ICD-10-CM | POA: Diagnosis not present

## 2019-07-26 DIAGNOSIS — Z79899 Other long term (current) drug therapy: Secondary | ICD-10-CM | POA: Diagnosis not present

## 2019-07-26 DIAGNOSIS — R319 Hematuria, unspecified: Secondary | ICD-10-CM | POA: Diagnosis not present

## 2019-07-26 DIAGNOSIS — R29810 Facial weakness: Secondary | ICD-10-CM | POA: Diagnosis not present

## 2019-07-26 DIAGNOSIS — I5032 Chronic diastolic (congestive) heart failure: Secondary | ICD-10-CM | POA: Diagnosis not present

## 2019-07-26 DIAGNOSIS — F329 Major depressive disorder, single episode, unspecified: Secondary | ICD-10-CM | POA: Diagnosis present

## 2019-07-26 DIAGNOSIS — E039 Hypothyroidism, unspecified: Secondary | ICD-10-CM | POA: Diagnosis present

## 2019-07-26 DIAGNOSIS — F0390 Unspecified dementia without behavioral disturbance: Secondary | ICD-10-CM | POA: Diagnosis present

## 2019-07-30 DIAGNOSIS — E785 Hyperlipidemia, unspecified: Secondary | ICD-10-CM | POA: Diagnosis not present

## 2019-07-30 DIAGNOSIS — G20 Parkinson's disease: Secondary | ICD-10-CM | POA: Diagnosis not present

## 2019-07-30 DIAGNOSIS — E119 Type 2 diabetes mellitus without complications: Secondary | ICD-10-CM | POA: Diagnosis not present

## 2019-07-30 DIAGNOSIS — N390 Urinary tract infection, site not specified: Secondary | ICD-10-CM | POA: Diagnosis not present

## 2019-08-02 DIAGNOSIS — Z20828 Contact with and (suspected) exposure to other viral communicable diseases: Secondary | ICD-10-CM | POA: Diagnosis not present

## 2019-08-08 DIAGNOSIS — Z20828 Contact with and (suspected) exposure to other viral communicable diseases: Secondary | ICD-10-CM | POA: Diagnosis not present

## 2019-09-04 DIAGNOSIS — I499 Cardiac arrhythmia, unspecified: Secondary | ICD-10-CM | POA: Diagnosis not present

## 2019-09-04 DIAGNOSIS — R000 Tachycardia, unspecified: Secondary | ICD-10-CM | POA: Diagnosis not present

## 2019-09-04 DIAGNOSIS — R9431 Abnormal electrocardiogram [ECG] [EKG]: Secondary | ICD-10-CM | POA: Diagnosis not present

## 2019-09-04 DIAGNOSIS — I4581 Long QT syndrome: Secondary | ICD-10-CM | POA: Diagnosis not present

## 2019-09-04 DIAGNOSIS — L409 Psoriasis, unspecified: Secondary | ICD-10-CM | POA: Diagnosis not present

## 2019-09-04 DIAGNOSIS — I4891 Unspecified atrial fibrillation: Secondary | ICD-10-CM | POA: Diagnosis not present

## 2019-09-05 DIAGNOSIS — Z79899 Other long term (current) drug therapy: Secondary | ICD-10-CM | POA: Diagnosis not present

## 2019-09-05 DIAGNOSIS — H2512 Age-related nuclear cataract, left eye: Secondary | ICD-10-CM | POA: Diagnosis not present

## 2019-09-05 DIAGNOSIS — E039 Hypothyroidism, unspecified: Secondary | ICD-10-CM | POA: Diagnosis not present

## 2019-09-05 DIAGNOSIS — R009 Unspecified abnormalities of heart beat: Secondary | ICD-10-CM | POA: Diagnosis not present

## 2019-09-05 DIAGNOSIS — D649 Anemia, unspecified: Secondary | ICD-10-CM | POA: Diagnosis not present

## 2019-09-05 DIAGNOSIS — I4891 Unspecified atrial fibrillation: Secondary | ICD-10-CM | POA: Diagnosis not present

## 2019-09-05 DIAGNOSIS — Z961 Presence of intraocular lens: Secondary | ICD-10-CM | POA: Diagnosis not present

## 2019-09-05 DIAGNOSIS — R918 Other nonspecific abnormal finding of lung field: Secondary | ICD-10-CM | POA: Diagnosis not present

## 2019-09-05 DIAGNOSIS — E1139 Type 2 diabetes mellitus with other diabetic ophthalmic complication: Secondary | ICD-10-CM | POA: Diagnosis not present

## 2019-09-06 DIAGNOSIS — R7989 Other specified abnormal findings of blood chemistry: Secondary | ICD-10-CM | POA: Diagnosis not present

## 2019-09-06 DIAGNOSIS — I4891 Unspecified atrial fibrillation: Secondary | ICD-10-CM | POA: Diagnosis not present

## 2019-09-06 DIAGNOSIS — I10 Essential (primary) hypertension: Secondary | ICD-10-CM | POA: Diagnosis not present

## 2019-09-06 DIAGNOSIS — Z79899 Other long term (current) drug therapy: Secondary | ICD-10-CM | POA: Diagnosis not present

## 2019-09-06 DIAGNOSIS — R0602 Shortness of breath: Secondary | ICD-10-CM | POA: Diagnosis not present

## 2019-09-10 DIAGNOSIS — I4891 Unspecified atrial fibrillation: Secondary | ICD-10-CM | POA: Diagnosis not present

## 2019-09-10 DIAGNOSIS — I509 Heart failure, unspecified: Secondary | ICD-10-CM | POA: Diagnosis not present

## 2019-09-11 DIAGNOSIS — I509 Heart failure, unspecified: Secondary | ICD-10-CM | POA: Diagnosis not present

## 2019-09-11 DIAGNOSIS — I4891 Unspecified atrial fibrillation: Secondary | ICD-10-CM | POA: Diagnosis not present

## 2019-09-12 DIAGNOSIS — Z20828 Contact with and (suspected) exposure to other viral communicable diseases: Secondary | ICD-10-CM | POA: Diagnosis not present

## 2019-09-13 ENCOUNTER — Telehealth: Payer: Self-pay | Admitting: *Deleted

## 2019-09-13 DIAGNOSIS — I10 Essential (primary) hypertension: Secondary | ICD-10-CM | POA: Diagnosis not present

## 2019-09-13 DIAGNOSIS — R9431 Abnormal electrocardiogram [ECG] [EKG]: Secondary | ICD-10-CM | POA: Diagnosis not present

## 2019-09-13 DIAGNOSIS — I509 Heart failure, unspecified: Secondary | ICD-10-CM | POA: Diagnosis not present

## 2019-09-13 DIAGNOSIS — I4891 Unspecified atrial fibrillation: Secondary | ICD-10-CM | POA: Diagnosis not present

## 2019-09-13 NOTE — Telephone Encounter (Signed)
Rosalie Doctor, NP for [facility] rehab aware per Dr Bronson Ing to stop ASA and start Xarelto 20 mg daily - f/u made for March and updated medication list

## 2019-09-13 NOTE — Telephone Encounter (Signed)
Nurse practitioner from new resident at assisted living says recent EKG showed Afib and wanting Dr Raylene Everts recs on if pt needs anticoag instead of just ASA 81 mg daily - she will fax over EKG and will give to provider. NP started pt on metorpolol and HR is controled at this time

## 2019-09-13 NOTE — Telephone Encounter (Signed)
I will review the ECG.  I would be careful about metoprolol because she has a prior history of bradycardia and beta-blockers were stopped for this reason.

## 2019-09-14 DIAGNOSIS — R0602 Shortness of breath: Secondary | ICD-10-CM | POA: Diagnosis not present

## 2019-09-14 DIAGNOSIS — I10 Essential (primary) hypertension: Secondary | ICD-10-CM | POA: Diagnosis not present

## 2019-09-14 DIAGNOSIS — D649 Anemia, unspecified: Secondary | ICD-10-CM | POA: Diagnosis not present

## 2019-09-16 DIAGNOSIS — Z9189 Other specified personal risk factors, not elsewhere classified: Secondary | ICD-10-CM | POA: Diagnosis not present

## 2019-09-16 DIAGNOSIS — I4891 Unspecified atrial fibrillation: Secondary | ICD-10-CM | POA: Diagnosis not present

## 2019-09-16 DIAGNOSIS — Z23 Encounter for immunization: Secondary | ICD-10-CM | POA: Diagnosis not present

## 2019-09-19 DIAGNOSIS — Z20828 Contact with and (suspected) exposure to other viral communicable diseases: Secondary | ICD-10-CM | POA: Diagnosis not present

## 2019-09-20 DIAGNOSIS — Z9981 Dependence on supplemental oxygen: Secondary | ICD-10-CM | POA: Diagnosis not present

## 2019-09-20 DIAGNOSIS — I4891 Unspecified atrial fibrillation: Secondary | ICD-10-CM | POA: Diagnosis not present

## 2019-09-20 DIAGNOSIS — R410 Disorientation, unspecified: Secondary | ICD-10-CM | POA: Diagnosis not present

## 2019-09-20 DIAGNOSIS — I10 Essential (primary) hypertension: Secondary | ICD-10-CM | POA: Diagnosis not present

## 2019-09-23 ENCOUNTER — Telehealth (INDEPENDENT_AMBULATORY_CARE_PROVIDER_SITE_OTHER): Payer: Medicare Other | Admitting: Cardiovascular Disease

## 2019-09-23 ENCOUNTER — Encounter: Payer: Self-pay | Admitting: Cardiovascular Disease

## 2019-09-23 ENCOUNTER — Telehealth: Payer: Self-pay

## 2019-09-23 VITALS — BP 134/67 | HR 87 | Resp 20 | Ht 60.0 in | Wt 163.0 lb

## 2019-09-23 DIAGNOSIS — I779 Disorder of arteries and arterioles, unspecified: Secondary | ICD-10-CM

## 2019-09-23 DIAGNOSIS — G4733 Obstructive sleep apnea (adult) (pediatric): Secondary | ICD-10-CM

## 2019-09-23 DIAGNOSIS — I10 Essential (primary) hypertension: Secondary | ICD-10-CM

## 2019-09-23 DIAGNOSIS — I5032 Chronic diastolic (congestive) heart failure: Secondary | ICD-10-CM

## 2019-09-23 DIAGNOSIS — I4891 Unspecified atrial fibrillation: Secondary | ICD-10-CM | POA: Diagnosis not present

## 2019-09-23 DIAGNOSIS — I739 Peripheral vascular disease, unspecified: Secondary | ICD-10-CM

## 2019-09-23 DIAGNOSIS — I25118 Atherosclerotic heart disease of native coronary artery with other forms of angina pectoris: Secondary | ICD-10-CM

## 2019-09-23 DIAGNOSIS — E785 Hyperlipidemia, unspecified: Secondary | ICD-10-CM

## 2019-09-23 NOTE — Progress Notes (Signed)
Virtual Visit via Telephone Note   This visit type was conducted due to national recommendations for restrictions regarding the COVID-19 Pandemic (e.g. social distancing) in an effort to limit this patient's exposure and mitigate transmission in our community.  Due to her co-morbid illnesses, this patient is at least at moderate risk for complications without adequate follow up.  This format is felt to be most appropriate for this patient at this time.  The patient did not have access to video technology/had technical difficulties with video requiring transitioning to audio format only (telephone).  All issues noted in this document were discussed and addressed.  No physical exam could be performed with this format.  Please refer to the patient's chart for her  consent to telehealth for [facility].   Date:  09/23/2019   ID:  Theresa Mclean, DOB 23-Sep-1928, MRN [id number]  Patient Location: Home Provider Location: Home  PCP:  Patient, No Pcp Per  Cardiologist:  Kate Sable, MD  Electrophysiologist:  None   Evaluation Performed:  Follow-Up Visit  Chief Complaint:  New onset atrial fibrillation  History of Present Illness:    Theresa Mclean is a 86 y.o. female with recently diagnosed atrial fibrillation.  I personally reviewed the ECG performed on 09/13/2019 which demonstrated rapid atrial fibrillation, 112 bpm.  I stop aspirin and start Xarelto.  She is also on metoprolol tartrate 25 mg twice daily.  She also has chronic diastolic heart failure, hypertension, PAD, and coronary artery disease.  She underwent directional atherectomy and percutaneous intervention for critical left SFA stenosis in June 2016.  Echocardiogram 01/29/16: Normal left ventricular systolic function, LVEF 37-16%, mild mitral regurgitation.  She is primarily wheelchair-bound.  I reviewed carotid Dopplers performed on 07/31/2017 which showed progression of large amount of bilateral atherosclerotic  plaque, left greater than right, though not definitely resulting in a hemodynamically significant stenosis within either internal carotid artery.  She resides at [facility].  I spoke with her nurse, Maudie Mercury.  The patient denies chest pain, palpitations, and leg swelling.  She refuses to use CPAP for sleep apnea.  There have been no bleeding issues.  Her nurse says she is stable.   Past Medical History:  Diagnosis Date  . Acute renal insufficiency    09/2011. not on ACEI secondary to this (also has renal artery stenosis)  . Arthritis   . CAD (coronary artery disease)    NSTEMI 09/2011 felt poor candidate for CABG, instead s/p PTCA/DES to mid RCA 10/17/11  . Carotid artery occlusion   . Cellulitis   . CHF (congestive heart failure) (HCC)    Diastolic. EF 55-65% by cath 10/11/11  . Chronic back pain   . Chronic knee pain   . Chronic respiratory failure ([place])   . CKD (chronic kidney disease) stage 2, GFR 60-89 ml/min 07/11/2013  . Diabetes mellitus    insulin dependent  . GERD (gastroesophageal reflux disease)   . Hyperlipidemia   . Hypertension   . Mitral regurgitation   . Morbid obesity ([place])   . Nephrolithiasis   . Obstructive sleep apnea   . Peripheral vascular disease ([place])    severe right renal artery stenosis and left SFA stenosis by PV angio 09/2011, treated medically  . Psoriasis   . Pulmonary hypertension ([place])   . Radiculopathy    lumbar  . Sleep apnea    uses cpap  . Vitamin D deficiency    Past Surgical History:  Procedure Laterality Date  . ABDOMINAL  HYSTERECTOMY    . ACROMIO-CLAVICULAR JOINT REPAIR  2011  . EYE SURGERY     cataract  OD  . IR GENERIC HISTORICAL  05/24/2016   IR RADIOLOGIST EVAL & MGMT 05/24/2016 Corrie Mckusick, DO [facility] INTERV RAD  . IR RADIOLOGIST EVAL & MGMT  06/28/2017  . LEFT AND RIGHT HEART CATHETERIZATION WITH CORONARY ANGIOGRAM N/A 10/11/2011   Procedure: LEFT AND RIGHT HEART CATHETERIZATION WITH CORONARY ANGIOGRAM;  Surgeon:  Sherren Mocha, MD;  Location: [facility] CATH LAB;  Service: Cardiovascular;  Laterality: N/A;  . LOWER EXTREMITY ANGIOGRAM N/A 10/11/2011   Procedure: LOWER EXTREMITY ANGIOGRAM;  Surgeon: Sherren Mocha, MD;  Location: [facility] CATH LAB;  Service: Cardiovascular;  Laterality: N/A;  . PERCUTANEOUS CORONARY STENT INTERVENTION (PCI-S) N/A 10/17/2011   Procedure: PERCUTANEOUS CORONARY STENT INTERVENTION (PCI-S);  Surgeon: Burnell Blanks, MD;  Location: [facility] CATH LAB;  Service: Cardiovascular;  Laterality: N/A;     Current Meds  Medication Sig  . acetaminophen (TYLENOL) 325 MG tablet Take 650 mg by mouth every 6 (six) hours as needed.  Marland Kitchen amLODipine (NORVASC) 5 MG tablet Take 5 mg by mouth daily.  Marland Kitchen ammonium lactate (AMLACTIN) 12 % cream Apply topically 2 (two) times daily as needed for dry skin.  . carbidopa-levodopa (SINEMET IR) 25-100 MG tablet 1 tablet at 8 am, 1 tablet at 12 pm, 1 tablet at 5 pm  . cholecalciferol (VITAMIN D) 1000 units tablet Take 1,000 Units by mouth daily.  . clobetasol (TEMOVATE) 0.05 % external solution Apply 1 application topically 2 (two) times daily.  . coal tar (NEUTROGENA T-GEL) 0.5 % shampoo Apply topically at bedtime as needed. Applty topically Tues, Thur & Sat on evening shift.  Marland Kitchen escitalopram (LEXAPRO) 10 MG tablet Take 1 tablet (10 mg total) by mouth daily.  . furosemide (LASIX) 40 MG tablet Take 40 mg by mouth daily.  Marland Kitchen gabapentin (NEURONTIN) 100 MG capsule Take 2 capsules (200 mg total) by mouth 2 (two) times daily.  . hydrALAZINE (APRESOLINE) 50 MG tablet TAKE 1 TABLET BY MOUTH 2 TIMES DAILY.  Marland Kitchen insulin glargine (LANTUS) 100 UNIT/ML injection Inject 10 Units into the skin at bedtime.  Marland Kitchen Ketoconazole-Hydrocortisone 2 & 1 % KIT Apply 1 application topically daily.  Marland Kitchen levothyroxine (SYNTHROID) 50 MCG tablet Take 50 mcg by mouth daily before breakfast.  . metoprolol tartrate (LOPRESSOR) 25 MG tablet Take 25 mg by mouth 2 (two) times daily.  . mometasone (ELOCON) 0.1 %  cream Apply 1 application topically 3 (three) times daily.  . nitroGLYCERIN (NITROSTAT) 0.4 MG SL tablet Place 1 tablet (0.4 mg total) under the tongue every 5 (five) minutes as needed.  Marland Kitchen omeprazole (PRILOSEC) 20 MG capsule Take 20 mg by mouth daily.  . rivaroxaban (XARELTO) 20 MG TABS tablet Take 20 mg by mouth daily with supper.     Allergies:   Ivp dye [iodinated diagnostic agents] and Rocephin [ceftriaxone]   Social History   Tobacco Use  . Smoking status: Never Smoker  . Smokeless tobacco: Never Used  Substance Use Topics  . Alcohol use: No    Alcohol/week: 0.0 standard drinks  . Drug use: No     Family Hx: The patient's family history includes Psoriasis in her brother.  ROS:   Please see the history of present illness.     All other systems reviewed and are negative.   Prior CV studies:   The following studies were reviewed today:  Reviewed above  Labs/Other Tests and Data Reviewed:  EKG:  Reviewed above  Recent Labs: No results found for requested labs within last 8760 hours.   Recent Lipid Panel No results found for: CHOL, TRIG, HDL, CHOLHDL, LDLCALC, LDLDIRECT  Wt Readings from Last 3 Encounters:  09/23/19 163 lb (73.9 kg)  05/21/18 170 lb (77.1 kg)  02/22/18 152 lb (68.9 kg)     Objective:    Vital Signs:  BP 134/67   Pulse 87   Resp 20   Ht 5' (1.524 m)   Wt 163 lb (73.9 kg)   LMP 08/29/1990   SpO2 99%   BMI 31.83 kg/m    VITAL SIGNS:  reviewed  ASSESSMENT & PLAN:    1.  New onset atrial fibrillation: Heart rate is controlled on metoprolol tartrate 25 mg twice daily.  Anticoagulated with Xarelto.  2.  Coronary artery disease: Symptoms are currently stable.I previously had a long talk with her daughter about further cardiac testing. I said that if we were unable to control symptoms with medications and proceeded with stress testing, coronary angiography would be the next step. I asked her if she and her family would want this and she  said she would have to speak with her brother who lives out of town and is the power of attorney. Resting nuclear images in 06/2014 did not show ischemia, and showed thinning in the basal anterolateral wall and inferolateral wall.  Has h/o PCI to RCA in 2013, with cath demonstrating 2-vessel disease in the RCA and with severe left circumflex disease (serial 90-95% mid LCx lesions by cath on 10-11-2011). Repeat nuclear myocardial perfusion study stress was normal in November of 2014.   Currently on metoprolol.  No aspirin as she is on Xarelto.  No longer on statin.  3.  Hypertension: Blood pressure is normal.  No changes to therapy.  4.  Obstructive sleep apnea: Refuses CPAP.  5.  Peripheral arterial disease: Severe right renal artery stenosis and underwent directional atherectomy and percutaneous intervention for left SFA critical stenosis in 01/2015.   No longer on statin.  No aspirin as she is on Xarelto.  6.  Hyperlipidemia: No longer on statin.  7. Bilateral carotid artery disease:Carotid Dopplers reviewed above.  No longer on statin.  8.  Chronic diastolic heart failure: Continue Lasix 40 mg daily.  Blood pressure is normal.      COVID-19 Education: The signs and symptoms of COVID-19 were discussed with the patient and how to seek care for testing (follow up with PCP or arrange E-visit).  The importance of social distancing was discussed today.  Time:   Today, I have spent 25 minutes with the patient with telehealth technology discussing the above problems.     Medication Adjustments/Labs and Tests Ordered: Current medicines are reviewed at length with the patient today.  Concerns regarding medicines are outlined above.   Tests Ordered: No orders of the defined types were placed in this encounter.   Medication Changes: No orders of the defined types were placed in this encounter.   Follow Up:  Virtual Visit  in 6 month(s)  Signed, Kate Sable, MD  09/23/2019 9:56  AM    [place]

## 2019-09-23 NOTE — Telephone Encounter (Signed)

## 2019-09-23 NOTE — Patient Instructions (Signed)
Medication Instructions:  Your physician recommends that you continue on your current medications as directed. Please refer to the Current Medication list given to you today.  *If you need a refill on your cardiac medications before your next appointment, please call your pharmacy*  Lab Work: NONE If you have labs (blood work) drawn today and your tests are completely normal, you will receive your results only by: Marland Kitchen MyChart Message (if you have MyChart) OR . A paper copy in the mail If you have any lab test that is abnormal or we need to change your treatment, we will call you to review the results.  Testing/Procedures: NONE  Follow-Up: At [facility], you and your health needs are our priority.  As part of our continuing mission to provide you with exceptional heart care, we have created designated Provider Care Teams.  These Care Teams include your primary Cardiologist (physician) and Advanced Practice Providers (APPs -  Physician Assistants and Nurse Practitioners) who all work together to provide you with the care you need, when you need it.  Your next appointment:   6 month(s)  The format for your next appointment:   In Person  Provider:   Bernerd Pho, PA-C, or Ermalinda Barrios PA-C  Other Instructions NONE     Thank you for choosing [facility] !

## 2019-09-24 DIAGNOSIS — I517 Cardiomegaly: Secondary | ICD-10-CM | POA: Diagnosis not present

## 2019-09-24 DIAGNOSIS — J810 Acute pulmonary edema: Secondary | ICD-10-CM | POA: Diagnosis not present

## 2019-09-24 DIAGNOSIS — I509 Heart failure, unspecified: Secondary | ICD-10-CM | POA: Diagnosis not present

## 2019-09-24 DIAGNOSIS — R410 Disorientation, unspecified: Secondary | ICD-10-CM | POA: Diagnosis not present

## 2019-09-25 DIAGNOSIS — R410 Disorientation, unspecified: Secondary | ICD-10-CM | POA: Diagnosis not present

## 2019-09-25 DIAGNOSIS — I509 Heart failure, unspecified: Secondary | ICD-10-CM | POA: Diagnosis not present

## 2019-09-25 DIAGNOSIS — F0390 Unspecified dementia without behavioral disturbance: Secondary | ICD-10-CM | POA: Diagnosis not present

## 2019-09-25 DIAGNOSIS — G20 Parkinson's disease: Secondary | ICD-10-CM | POA: Diagnosis not present

## 2019-09-27 DIAGNOSIS — Z20828 Contact with and (suspected) exposure to other viral communicable diseases: Secondary | ICD-10-CM | POA: Diagnosis not present

## 2019-09-28 DIAGNOSIS — K219 Gastro-esophageal reflux disease without esophagitis: Secondary | ICD-10-CM | POA: Diagnosis not present

## 2019-09-28 DIAGNOSIS — L03818 Cellulitis of other sites: Secondary | ICD-10-CM | POA: Diagnosis not present

## 2019-09-28 DIAGNOSIS — Z9229 Personal history of other drug therapy: Secondary | ICD-10-CM | POA: Diagnosis not present

## 2019-09-28 DIAGNOSIS — Z7982 Long term (current) use of aspirin: Secondary | ICD-10-CM | POA: Diagnosis not present

## 2019-09-28 DIAGNOSIS — G9341 Metabolic encephalopathy: Secondary | ICD-10-CM | POA: Diagnosis not present

## 2019-09-28 DIAGNOSIS — E119 Type 2 diabetes mellitus without complications: Secondary | ICD-10-CM | POA: Diagnosis not present

## 2019-09-28 DIAGNOSIS — R42 Dizziness and giddiness: Secondary | ICD-10-CM | POA: Diagnosis not present

## 2019-09-28 DIAGNOSIS — I4891 Unspecified atrial fibrillation: Secondary | ICD-10-CM | POA: Diagnosis not present

## 2019-09-28 DIAGNOSIS — R32 Unspecified urinary incontinence: Secondary | ICD-10-CM | POA: Diagnosis not present

## 2019-09-28 DIAGNOSIS — Z9981 Dependence on supplemental oxygen: Secondary | ICD-10-CM | POA: Diagnosis not present

## 2019-09-28 DIAGNOSIS — I259 Chronic ischemic heart disease, unspecified: Secondary | ICD-10-CM | POA: Diagnosis not present

## 2019-09-28 DIAGNOSIS — Z7984 Long term (current) use of oral hypoglycemic drugs: Secondary | ICD-10-CM | POA: Diagnosis not present

## 2019-09-28 DIAGNOSIS — E039 Hypothyroidism, unspecified: Secondary | ICD-10-CM | POA: Diagnosis not present

## 2019-09-28 DIAGNOSIS — Z79899 Other long term (current) drug therapy: Secondary | ICD-10-CM | POA: Diagnosis not present

## 2019-09-28 DIAGNOSIS — G20 Parkinson's disease: Secondary | ICD-10-CM | POA: Diagnosis not present

## 2019-09-28 DIAGNOSIS — D631 Anemia in chronic kidney disease: Secondary | ICD-10-CM | POA: Diagnosis not present

## 2019-09-28 DIAGNOSIS — Z993 Dependence on wheelchair: Secondary | ICD-10-CM | POA: Diagnosis not present

## 2019-09-28 DIAGNOSIS — L219 Seborrheic dermatitis, unspecified: Secondary | ICD-10-CM | POA: Diagnosis not present

## 2019-09-28 DIAGNOSIS — F0390 Unspecified dementia without behavioral disturbance: Secondary | ICD-10-CM | POA: Diagnosis not present

## 2019-09-28 DIAGNOSIS — U071 COVID-19: Secondary | ICD-10-CM | POA: Diagnosis not present

## 2019-09-28 DIAGNOSIS — L853 Xerosis cutis: Secondary | ICD-10-CM | POA: Diagnosis not present

## 2019-09-28 DIAGNOSIS — L409 Psoriasis, unspecified: Secondary | ICD-10-CM | POA: Diagnosis not present

## 2019-09-28 DIAGNOSIS — L22 Diaper dermatitis: Secondary | ICD-10-CM | POA: Diagnosis not present

## 2019-09-28 DIAGNOSIS — I10 Essential (primary) hypertension: Secondary | ICD-10-CM | POA: Diagnosis not present

## 2019-09-28 DIAGNOSIS — F339 Major depressive disorder, recurrent, unspecified: Secondary | ICD-10-CM | POA: Diagnosis not present

## 2019-09-28 DIAGNOSIS — R1311 Dysphagia, oral phase: Secondary | ICD-10-CM | POA: Diagnosis not present

## 2019-09-28 DIAGNOSIS — N390 Urinary tract infection, site not specified: Secondary | ICD-10-CM | POA: Diagnosis not present

## 2019-09-28 DIAGNOSIS — N189 Chronic kidney disease, unspecified: Secondary | ICD-10-CM | POA: Diagnosis not present

## 2019-09-28 DIAGNOSIS — E785 Hyperlipidemia, unspecified: Secondary | ICD-10-CM | POA: Diagnosis not present

## 2019-09-28 DIAGNOSIS — I509 Heart failure, unspecified: Secondary | ICD-10-CM | POA: Diagnosis not present

## 2019-09-28 DIAGNOSIS — I2510 Atherosclerotic heart disease of native coronary artery without angina pectoris: Secondary | ICD-10-CM | POA: Diagnosis not present

## 2019-09-28 DIAGNOSIS — M6281 Muscle weakness (generalized): Secondary | ICD-10-CM | POA: Diagnosis not present

## 2019-10-01 DIAGNOSIS — U071 COVID-19: Secondary | ICD-10-CM | POA: Diagnosis not present

## 2019-10-01 DIAGNOSIS — Z9229 Personal history of other drug therapy: Secondary | ICD-10-CM | POA: Diagnosis not present

## 2019-10-01 DIAGNOSIS — Z9981 Dependence on supplemental oxygen: Secondary | ICD-10-CM | POA: Diagnosis not present

## 2019-10-03 DIAGNOSIS — U071 COVID-19: Secondary | ICD-10-CM | POA: Diagnosis not present

## 2019-10-03 DIAGNOSIS — F0390 Unspecified dementia without behavioral disturbance: Secondary | ICD-10-CM | POA: Diagnosis not present

## 2019-10-03 DIAGNOSIS — N189 Chronic kidney disease, unspecified: Secondary | ICD-10-CM | POA: Diagnosis not present

## 2019-10-03 DIAGNOSIS — I4891 Unspecified atrial fibrillation: Secondary | ICD-10-CM | POA: Diagnosis not present

## 2019-10-04 DIAGNOSIS — U071 COVID-19: Secondary | ICD-10-CM | POA: Diagnosis not present

## 2019-10-04 DIAGNOSIS — L22 Diaper dermatitis: Secondary | ICD-10-CM | POA: Diagnosis not present

## 2019-10-04 DIAGNOSIS — Z9229 Personal history of other drug therapy: Secondary | ICD-10-CM | POA: Diagnosis not present

## 2019-10-04 DIAGNOSIS — R32 Unspecified urinary incontinence: Secondary | ICD-10-CM | POA: Diagnosis not present

## 2019-10-07 DIAGNOSIS — U071 COVID-19: Secondary | ICD-10-CM | POA: Diagnosis not present

## 2019-10-07 DIAGNOSIS — L22 Diaper dermatitis: Secondary | ICD-10-CM | POA: Diagnosis not present

## 2019-10-07 DIAGNOSIS — Z9229 Personal history of other drug therapy: Secondary | ICD-10-CM | POA: Diagnosis not present

## 2019-10-14 DIAGNOSIS — F3289 Other specified depressive episodes: Secondary | ICD-10-CM | POA: Diagnosis not present

## 2019-10-28 DIAGNOSIS — F3289 Other specified depressive episodes: Secondary | ICD-10-CM | POA: Diagnosis not present

## 2019-10-31 ENCOUNTER — Telehealth: Payer: Medicare Other | Admitting: Cardiovascular Disease

## 2019-11-12 DIAGNOSIS — R1311 Dysphagia, oral phase: Secondary | ICD-10-CM | POA: Diagnosis not present

## 2019-11-12 DIAGNOSIS — G20 Parkinson's disease: Secondary | ICD-10-CM | POA: Diagnosis not present

## 2019-11-13 DIAGNOSIS — R1311 Dysphagia, oral phase: Secondary | ICD-10-CM | POA: Diagnosis not present

## 2019-11-13 DIAGNOSIS — G20 Parkinson's disease: Secondary | ICD-10-CM | POA: Diagnosis not present

## 2019-11-14 DIAGNOSIS — R1311 Dysphagia, oral phase: Secondary | ICD-10-CM | POA: Diagnosis not present

## 2019-11-14 DIAGNOSIS — G20 Parkinson's disease: Secondary | ICD-10-CM | POA: Diagnosis not present

## 2019-11-15 DIAGNOSIS — G20 Parkinson's disease: Secondary | ICD-10-CM | POA: Diagnosis not present

## 2019-11-15 DIAGNOSIS — R1311 Dysphagia, oral phase: Secondary | ICD-10-CM | POA: Diagnosis not present

## 2019-11-18 DIAGNOSIS — R1311 Dysphagia, oral phase: Secondary | ICD-10-CM | POA: Diagnosis not present

## 2019-11-18 DIAGNOSIS — G20 Parkinson's disease: Secondary | ICD-10-CM | POA: Diagnosis not present

## 2019-11-19 DIAGNOSIS — R1311 Dysphagia, oral phase: Secondary | ICD-10-CM | POA: Diagnosis not present

## 2019-11-19 DIAGNOSIS — G20 Parkinson's disease: Secondary | ICD-10-CM | POA: Diagnosis not present

## 2019-11-20 DIAGNOSIS — R1311 Dysphagia, oral phase: Secondary | ICD-10-CM | POA: Diagnosis not present

## 2019-11-20 DIAGNOSIS — G20 Parkinson's disease: Secondary | ICD-10-CM | POA: Diagnosis not present

## 2019-11-25 DIAGNOSIS — R1311 Dysphagia, oral phase: Secondary | ICD-10-CM | POA: Diagnosis not present

## 2019-11-25 DIAGNOSIS — G20 Parkinson's disease: Secondary | ICD-10-CM | POA: Diagnosis not present

## 2019-11-26 DIAGNOSIS — G20 Parkinson's disease: Secondary | ICD-10-CM | POA: Diagnosis not present

## 2019-11-26 DIAGNOSIS — R1311 Dysphagia, oral phase: Secondary | ICD-10-CM | POA: Diagnosis not present

## 2019-11-27 DIAGNOSIS — R1311 Dysphagia, oral phase: Secondary | ICD-10-CM | POA: Diagnosis not present

## 2019-11-27 DIAGNOSIS — G20 Parkinson's disease: Secondary | ICD-10-CM | POA: Diagnosis not present

## 2019-12-05 DIAGNOSIS — A499 Bacterial infection, unspecified: Secondary | ICD-10-CM | POA: Diagnosis not present

## 2019-12-05 DIAGNOSIS — R110 Nausea: Secondary | ICD-10-CM | POA: Diagnosis not present

## 2019-12-05 DIAGNOSIS — I10 Essential (primary) hypertension: Secondary | ICD-10-CM | POA: Diagnosis not present

## 2019-12-05 DIAGNOSIS — N390 Urinary tract infection, site not specified: Secondary | ICD-10-CM | POA: Diagnosis not present

## 2020-03-24 ENCOUNTER — Ambulatory Visit: Payer: Medicare Other | Admitting: Student

## 2020-05-22 ENCOUNTER — Other Ambulatory Visit: Payer: Self-pay

## 2020-05-22 ENCOUNTER — Encounter: Payer: Self-pay | Admitting: Student

## 2020-05-22 ENCOUNTER — Telehealth (INDEPENDENT_AMBULATORY_CARE_PROVIDER_SITE_OTHER): Payer: Medicare Other | Admitting: Student

## 2020-05-22 VITALS — BP 106/72 | HR 73 | Resp 18 | Wt 147.0 lb

## 2020-05-22 DIAGNOSIS — I2510 Atherosclerotic heart disease of native coronary artery without angina pectoris: Secondary | ICD-10-CM

## 2020-05-22 DIAGNOSIS — I10 Essential (primary) hypertension: Secondary | ICD-10-CM

## 2020-05-22 DIAGNOSIS — I739 Peripheral vascular disease, unspecified: Secondary | ICD-10-CM | POA: Diagnosis not present

## 2020-05-22 DIAGNOSIS — N182 Chronic kidney disease, stage 2 (mild): Secondary | ICD-10-CM

## 2020-05-22 DIAGNOSIS — I480 Paroxysmal atrial fibrillation: Secondary | ICD-10-CM

## 2020-05-22 NOTE — Progress Notes (Signed)
Virtual Visit via Telephone Note   This visit type was conducted due to national recommendations for restrictions regarding the COVID-19 Pandemic (e.g. social distancing) in an effort to limit this patient's exposure and mitigate transmission in our community.  Due to her co-morbid illnesses, this patient is at least at moderate risk for complications without adequate follow up.  This format is felt to be most appropriate for this patient at this time.  The patient did not have access to video technology/had technical difficulties with video requiring transitioning to audio format only (telephone).  All issues noted in this document were discussed and addressed.  No physical exam could be performed with this format.  Please refer to the patient's chart for her  consent to telehealth for [facility].   Date:  05/22/2020   ID:  Theresa Mclean, DOB 10/31/1928, MRN [id number] The patient was identified using 2 identifiers.  Patient Location: [place] Provider Location: Office/Clinic  PCP:  Patient, No Pcp Per  Cardiologist:  Kate Sable, MD (Inactive) --> Will switch to Dr. Harl Bowie Electrophysiologist:  None   Evaluation Performed:  Follow-Up Visit  Chief Complaint: Follow-up; No specific complaints  History of Present Illness:    Theresa Mclean is a 86 y.o. female with with past medical history of CAD (s/p NSTEMI in 2013 and not a CABG candidate --> underwent DES to mid-RCA, low-risk NST in 06/2014), PAD (s/p directional atherectomy and PCI to left SFA in 2016), carotid artery stenosis, chronic diastolic CHF, Parkinson's Diease, HTN, HLD, Type 2 DM and Stage 2-3 CKD who presents for an 55-monthfollow-up telehealth visit.   She most recently had a phone visit with Dr. KBronson Ingin 08/2019 and had recently been diagnosed with atrial fibrillation while on SNF. ASA was discontinued and she was transitioned to Xarelto 283mdaily (creatinine clearance 58 based off labs and  weight at that time) along with Lopressor 2563mID for rate-control.   Most history during today's encounter is provided by the patient's nurse (JoMerrily Pews he reports she has dementia. She has not reported to them any recent chest pain or palpitations. She is mostly wheelchair/bedbound at baseline. She does complain of knee pain at times. He has not noticed any labored breathing or lower extremity edema.  They do check her vitals multiple times each day and pulse is typically in the 70's to 90's. She did have labs on 04/17/2020 and Hgb was at 13.3 and creatinine at 0.57.  The patient does not have symptoms concerning for COVID-19 infection (fever, chills, cough, or new shortness of breath).    Past Medical History:  Diagnosis Date   Acute renal insufficiency    09/2011. not on ACEI secondary to this (also has renal artery stenosis)   Arthritis    CAD (coronary artery disease)    NSTEMI 09/2011 felt poor candidate for CABG, instead s/p PTCA/DES to mid RCA 10/17/11   Carotid artery occlusion    Cellulitis    CHF (congestive heart failure) (HCC)    Diastolic. EF 55-65% by cath 10/11/11   Chronic back pain    Chronic knee pain    Chronic respiratory failure (HCC)    CKD (chronic kidney disease) stage 2, GFR 60-89 ml/min 07/11/2013   Diabetes mellitus    insulin dependent   GERD (gastroesophageal reflux disease)    Hyperlipidemia    Hypertension    Mitral regurgitation    [place]  Nephrolithiasis    Obstructive sleep  apnea    Peripheral vascular disease (HCC)    severe right renal artery stenosis and left SFA stenosis by PV angio 09/2011, treated medically   Psoriasis    Pulmonary hypertension ([place])    Radiculopathy    lumbar   Sleep apnea    uses cpap   Vitamin D deficiency    Past Surgical History:  Procedure Laterality Date   ABDOMINAL HYSTERECTOMY     ACROMIO-CLAVICULAR JOINT REPAIR  2011   EYE SURGERY     cataract  OD   IR GENERIC  HISTORICAL  05/24/2016   IR RADIOLOGIST EVAL & MGMT 05/24/2016 Corrie Mckusick, DO [facility] INTERV RAD   IR RADIOLOGIST EVAL & MGMT  06/28/2017   LEFT AND RIGHT HEART CATHETERIZATION WITH CORONARY ANGIOGRAM N/A 10/11/2011   Procedure: LEFT AND RIGHT HEART CATHETERIZATION WITH CORONARY ANGIOGRAM;  Surgeon: Sherren Mocha, MD;  Location: [facility] CATH LAB;  Service: Cardiovascular;  Laterality: N/A;   LOWER EXTREMITY ANGIOGRAM N/A 10/11/2011   Procedure: LOWER EXTREMITY ANGIOGRAM;  Surgeon: Sherren Mocha, MD;  Location: [facility] CATH LAB;  Service: Cardiovascular;  Laterality: N/A;   PERCUTANEOUS CORONARY STENT INTERVENTION (PCI-S) N/A 10/17/2011   Procedure: PERCUTANEOUS CORONARY STENT INTERVENTION (PCI-S);  Surgeon: Burnell Blanks, MD;  Location[facility] CATH LAB;  Service: Cardiovascular;  Laterality: N/A;     Current Meds  Medication Sig   acetaminophen (TYLENOL) 325 MG tablet Take 650 mg by mouth every 6 (six) hours as needed.   ammonium lactate (AMLACTIN) 12 % cream Apply topically 2 (two) times daily as needed for dry skin.   carbidopa-levodopa (SINEMET IR) 25-100 MG tablet 1 tablet at 8 am, 1 tablet at 12 pm, 1 tablet at 5 pm   cholecalciferol (VITAMIN D) 1000 units tablet Take 1,000 Units by mouth daily.   clobetasol (TEMOVATE) 0.05 % external solution Apply 1 application topically 2 (two) times daily.   coal tar (NEUTROGENA T-GEL) 0.5 % shampoo Apply topically at bedtime as needed. Applty topically Tues, Thur & Sat on evening shift.   escitalopram (LEXAPRO) 10 MG tablet Take 1 tablet (10 mg total) by mouth daily.   furosemide (LASIX) 40 MG tablet Take 40 mg by mouth daily.   gabapentin (NEURONTIN) 100 MG capsule Take 2 capsules (200 mg total) by mouth 2 (two) times daily. (Patient taking differently: Take 100 mg by mouth 2 (two) times daily. )   hydrALAZINE (APRESOLINE) 50 MG tablet TAKE 1 TABLET BY MOUTH 2 TIMES DAILY.   insulin glargine (LANTUS) 100 UNIT/ML injection Inject 10 Units  into the skin at bedtime.   Ketoconazole-Hydrocortisone 2 & 1 % KIT Apply 1 application topically daily.   levothyroxine (SYNTHROID) 50 MCG tablet Take 50 mcg by mouth daily before breakfast.   metoprolol tartrate (LOPRESSOR) 25 MG tablet Take 25 mg by mouth 2 (two) times daily.   mometasone (ELOCON) 0.1 % cream Apply 1 application topically 3 (three) times daily.   nitroGLYCERIN (NITROSTAT) 0.4 MG SL tablet Place 1 tablet (0.4 mg total) under the tongue every 5 (five) minutes as needed.   omeprazole (PRILOSEC) 20 MG capsule Take 20 mg by mouth daily.   rivaroxaban (XARELTO) 20 MG TABS tablet Take 20 mg by mouth daily with supper.     Allergies:   Ivp dye [iodinated diagnostic agents] and Rocephin [ceftriaxone]   Social History   Tobacco Use   Smoking status: Never Smoker   Smokeless tobacco: Never Used  Vaping Use   Vaping Use: Never used  Substance Use Topics  Alcohol use: No    Alcohol/week: 0.0 standard drinks   Drug use: No     Family Hx: The patient's family history includes Psoriasis in her brother.  ROS:   Please see the history of present illness.     All other systems reviewed and are negative.   Prior CV studies:   The following studies were reviewed today:  Echocardiogram: 01/2016 Study Conclusions   - Left ventricle: The cavity size was normal. Wall thickness was  normal. Systolic function was normal. The estimated ejection  fraction was in the range of 60% to 65%. Wall motion was normal;  there were no regional wall motion abnormalities.  - Aortic valve: Mildly calcified annulus. Mildly thickened  leaflets. Valve area (VTI): 1.95 cm^2. Valve area (Vmax): 1.8  cm^2. Valve area (Vmean): 1.86 cm^2.  - Mitral valve: Mildly calcified annulus. Mildly thickened leaflets  . There was mild regurgitation. Valve area by continuity equation  (using LVOT flow): 2.17 cm^2.  - Left atrium: The atrium was mildly dilated.  - Atrial septum: No  defect or patent foramen ovale was identified.  - Pulmonary arteries: Systolic pressure was moderately increased.  PA peak pressure: 64 mm Hg (S).  - Technically adequate study.   Carotid Dopplers: 07/2017 IMPRESSION: Suspected progression of large amount of bilateral atherosclerotic plaque, left subjectively greater than right, though not definitely resulting in a hemodynamically significant stenosis within either internal carotid artery. Further evaluation with CTA could be performed as clinically indicated.  Labs/Other Tests and Data Reviewed:    EKG:  No ECG reviewed.  Recent Labs: No results found for requested labs within last 8760 hours.   Recent Lipid Panel No results found for: CHOL, TRIG, HDL, CHOLHDL, LDLCALC, LDLDIRECT  Wt Readings from Last 3 Encounters:  05/22/20 147 lb (66.7 kg)  09/23/19 163 lb (73.9 kg)  05/21/18 170 lb (77.1 kg)     Objective:    Vital Signs:  BP 106/72    Pulse 73    Resp 18    Wt 147 lb (66.7 kg)    LMP 08/29/1990    SpO2 97%    BMI 28.71 kg/m    Physical Exam not performed as this was a Virtual Visit  ASSESSMENT & PLAN:    1. CAD - She is s/p NSTEMI in 2013 and not a CABG candidate --> underwent DES to mid-RCA with most recent ischemic evaluation being a low-risk NST in 06/2014. - No reported anginal symptoms but history is limited secondary to dementia. Continue with medical therapy at this time. Remains on Lopressor 73m BID. No longer on a statin due to her advanced age. No longer on ASA given the need for anticoagulation.   2. Paroxysmal Atrial Fibrillation - She has not reported any palpitations and HR has been well-controlled in the 70's to 90's. Remains on Lopressor for rate-control. - No reports of melena or hematochezia. Hgb stable at 13.3 by labs in 03/2020. Continue Xarelto 241mdaily (appropriate dosing at this time given her calculated creatinine clearance).   3. PAD/Carotid Artery Stenosis - She is s/p directional  atherectomy and PCI to left SFA in 2016. Activity is limited per report as she is mostly bed bound.   4. HTN - BP was well-controlled at 106/72 on most recent check. Continue current medication regimen with Amlodipine 62m762maily, Hydralazine 58m1mD and Lopressor 262mg59m.   5. Stage 2 -3 CKD - Creatinine was at 1.1 - 1.2 in 2019, at 0.57 by recent  labs in 03/2020.   Will fax today's note to [facility] at [phone] (ATTN: Carolin Coy).    Time:   Today, I have spent 14 minutes with the patient with telehealth technology discussing the above problems.     Medication Adjustments/Labs and Tests Ordered: Current medicines are reviewed at length with the patient today.  Concerns regarding medicines are outlined above.   Tests Ordered: No orders of the defined types were placed in this encounter.   Medication Changes: No orders of the defined types were placed in this encounter.   Follow Up:  In Person in 1 year(s)  Signed, Erma Heritage, PA-C  05/22/2020 5:01 PM    [place]

## 2020-05-22 NOTE — Patient Instructions (Addendum)
Medication Instructions:  Continue all current medications.  Labwork: none  Testing/Procedures: none  Follow-Up: 1 year with Dr. Harl Bowie  Any Other Special Instructions Will Be Listed Below (If Applicable).  If you need a refill on your cardiac medications before your next appointment, please call your pharmacy.

## 2020-07-16 ENCOUNTER — Ambulatory Visit: Payer: Medicare Other | Admitting: Student

## 2021-08-29 DEATH — deceased
